# Patient Record
Sex: Female | Born: 1955 | Race: White | Hispanic: No | Smoking: Current every day smoker
Health system: Southern US, Community
[De-identification: ages and names within clinical notes are randomized; demographics above are authoritative.]

## PROBLEM LIST (undated history)

## (undated) DIAGNOSIS — N289 Disorder of kidney and ureter, unspecified: Secondary | ICD-10-CM

## (undated) DIAGNOSIS — S069XAA Unspecified intracranial injury with loss of consciousness status unknown, initial encounter: Secondary | ICD-10-CM

## (undated) DIAGNOSIS — F0390 Unspecified dementia without behavioral disturbance: Secondary | ICD-10-CM

## (undated) DIAGNOSIS — K219 Gastro-esophageal reflux disease without esophagitis: Secondary | ICD-10-CM

## (undated) DIAGNOSIS — G629 Polyneuropathy, unspecified: Secondary | ICD-10-CM

## (undated) DIAGNOSIS — J449 Chronic obstructive pulmonary disease, unspecified: Secondary | ICD-10-CM

## (undated) DIAGNOSIS — J45909 Unspecified asthma, uncomplicated: Secondary | ICD-10-CM

## (undated) DIAGNOSIS — E785 Hyperlipidemia, unspecified: Secondary | ICD-10-CM

## (undated) DIAGNOSIS — F419 Anxiety disorder, unspecified: Secondary | ICD-10-CM

## (undated) DIAGNOSIS — M1990 Unspecified osteoarthritis, unspecified site: Secondary | ICD-10-CM

## (undated) DIAGNOSIS — F32A Depression, unspecified: Secondary | ICD-10-CM

## (undated) DIAGNOSIS — G4730 Sleep apnea, unspecified: Secondary | ICD-10-CM

## (undated) DIAGNOSIS — E7800 Pure hypercholesterolemia, unspecified: Secondary | ICD-10-CM

## (undated) DIAGNOSIS — I6782 Cerebral ischemia: Secondary | ICD-10-CM

## (undated) DIAGNOSIS — F329 Major depressive disorder, single episode, unspecified: Secondary | ICD-10-CM

## (undated) DIAGNOSIS — R569 Unspecified convulsions: Secondary | ICD-10-CM

## (undated) HISTORY — PX: ESOPHAGEAL DILATION: SHX303

## (undated) HISTORY — PX: FOOT SURGERY: SHX648

---

## 2004-04-14 ENCOUNTER — Emergency Department: Payer: Self-pay | Admitting: Emergency Medicine

## 2008-11-09 ENCOUNTER — Emergency Department: Payer: Self-pay | Admitting: Internal Medicine

## 2009-05-25 ENCOUNTER — Emergency Department: Payer: Self-pay | Admitting: Emergency Medicine

## 2009-05-29 ENCOUNTER — Emergency Department: Payer: Self-pay | Admitting: Emergency Medicine

## 2009-07-26 ENCOUNTER — Emergency Department: Payer: Self-pay | Admitting: Emergency Medicine

## 2009-10-07 ENCOUNTER — Emergency Department: Payer: Self-pay | Admitting: Emergency Medicine

## 2010-05-15 ENCOUNTER — Inpatient Hospital Stay: Payer: Self-pay | Admitting: Internal Medicine

## 2010-11-05 ENCOUNTER — Inpatient Hospital Stay (HOSPITAL_COMMUNITY)
Admission: EM | Admit: 2010-11-05 | Discharge: 2010-11-10 | DRG: 100 | Disposition: A | Discharge status: Home / Self Care | Payer: Medicaid Other | Attending: Internal Medicine | Admitting: Internal Medicine

## 2010-11-05 ENCOUNTER — Emergency Department (HOSPITAL_COMMUNITY): Payer: Medicaid Other

## 2010-11-05 DIAGNOSIS — J9600 Acute respiratory failure, unspecified whether with hypoxia or hypercapnia: Secondary | ICD-10-CM | POA: Diagnosis present

## 2010-11-05 DIAGNOSIS — F3289 Other specified depressive episodes: Secondary | ICD-10-CM | POA: Diagnosis present

## 2010-11-05 DIAGNOSIS — J45909 Unspecified asthma, uncomplicated: Secondary | ICD-10-CM | POA: Diagnosis present

## 2010-11-05 DIAGNOSIS — K219 Gastro-esophageal reflux disease without esophagitis: Secondary | ICD-10-CM | POA: Diagnosis present

## 2010-11-05 DIAGNOSIS — F329 Major depressive disorder, single episode, unspecified: Secondary | ICD-10-CM | POA: Diagnosis present

## 2010-11-05 DIAGNOSIS — E785 Hyperlipidemia, unspecified: Secondary | ICD-10-CM | POA: Diagnosis present

## 2010-11-05 DIAGNOSIS — Z79899 Other long term (current) drug therapy: Secondary | ICD-10-CM

## 2010-11-05 DIAGNOSIS — G40802 Other epilepsy, not intractable, without status epilepticus: Principal | ICD-10-CM | POA: Diagnosis present

## 2010-11-05 DIAGNOSIS — N390 Urinary tract infection, site not specified: Secondary | ICD-10-CM | POA: Diagnosis present

## 2010-11-05 DIAGNOSIS — Z87820 Personal history of traumatic brain injury: Secondary | ICD-10-CM

## 2010-11-05 DIAGNOSIS — J690 Pneumonitis due to inhalation of food and vomit: Secondary | ICD-10-CM | POA: Diagnosis present

## 2010-11-05 LAB — CBC
MCH: 31.2 pg (ref 26.0–34.0)
MCV: 94.5 fL (ref 78.0–100.0)
Platelets: 232 10*3/uL (ref 150–400)
RBC: 3.82 MIL/uL — ABNORMAL LOW (ref 3.87–5.11)
RDW: 14 % (ref 11.5–15.5)

## 2010-11-05 LAB — POCT I-STAT 3, ART BLOOD GAS (G3+)
Acid-Base Excess: 2 mmol/L (ref 0.0–2.0)
Bicarbonate: 26.6 mEq/L — ABNORMAL HIGH (ref 20.0–24.0)
O2 Saturation: 100 %
TCO2: 28 mmol/L (ref 0–100)

## 2010-11-05 LAB — CK TOTAL AND CKMB (NOT AT ARMC)
CK, MB: 3 ng/mL (ref 0.3–4.0)
Total CK: 186 U/L — ABNORMAL HIGH (ref 7–177)

## 2010-11-05 LAB — RAPID URINE DRUG SCREEN, HOSP PERFORMED
Benzodiazepines: POSITIVE — AB
Tetrahydrocannabinol: NOT DETECTED

## 2010-11-05 LAB — URINALYSIS, ROUTINE W REFLEX MICROSCOPIC
Glucose, UA: NEGATIVE mg/dL
Leukocytes, UA: NEGATIVE
Protein, ur: 100 mg/dL — AB
Urobilinogen, UA: 0.2 mg/dL (ref 0.0–1.0)

## 2010-11-05 LAB — ETHANOL: Alcohol, Ethyl (B): <11 mg/dL — ABNORMAL HIGH (ref 0–10)

## 2010-11-05 LAB — DIFFERENTIAL
Eosinophils Absolute: 0.2 10*3/uL (ref 0.0–0.7)
Eosinophils Relative: 2 % (ref 0–5)
Lymphs Abs: 1.1 10*3/uL (ref 0.7–4.0)
Monocytes Relative: 6 % (ref 3–12)

## 2010-11-05 LAB — COMPREHENSIVE METABOLIC PANEL
Albumin: 3.8 g/dL (ref 3.5–5.2)
BUN: 12 mg/dL (ref 6–23)
Chloride: 100 mEq/L (ref 96–112)
Creatinine, Ser: 0.93 mg/dL (ref 0.4–1.2)
GFR calc non Af Amer: >60 mL/min (ref >60)
Glucose, Bld: 153 mg/dL — ABNORMAL HIGH (ref 70–99)
Total Bilirubin: 0.1 mg/dL — ABNORMAL LOW (ref 0.3–1.2)

## 2010-11-05 LAB — ACETAMINOPHEN LEVEL: Acetaminophen (Tylenol), Serum: <15 ug/mL (ref 10–30)

## 2010-11-05 LAB — AMMONIA: Ammonia: 36 umol/L (ref 11–60)

## 2010-11-05 LAB — SALICYLATE LEVEL: Salicylate Lvl: <2 mg/dL — ABNORMAL LOW (ref 2.8–20.0)

## 2010-11-05 LAB — URINE MICROSCOPIC-ADD ON

## 2010-11-06 ENCOUNTER — Inpatient Hospital Stay (HOSPITAL_COMMUNITY): Payer: Medicaid Other

## 2010-11-06 DIAGNOSIS — R569 Unspecified convulsions: Secondary | ICD-10-CM

## 2010-11-06 DIAGNOSIS — J45909 Unspecified asthma, uncomplicated: Secondary | ICD-10-CM

## 2010-11-06 DIAGNOSIS — J9600 Acute respiratory failure, unspecified whether with hypoxia or hypercapnia: Secondary | ICD-10-CM

## 2010-11-06 LAB — BASIC METABOLIC PANEL
CO2: 25 mEq/L (ref 19–32)
Calcium: 8.4 mg/dL (ref 8.4–10.5)
Chloride: 101 mEq/L (ref 96–112)
Creatinine, Ser: 0.71 mg/dL (ref 0.4–1.2)
Glucose, Bld: 139 mg/dL — ABNORMAL HIGH (ref 70–99)

## 2010-11-06 LAB — URINE CULTURE
Colony Count: NO GROWTH
Culture  Setup Time: 201205280048
Culture: NO GROWTH

## 2010-11-06 LAB — URINALYSIS, ROUTINE W REFLEX MICROSCOPIC
Glucose, UA: NEGATIVE mg/dL
Nitrite: NEGATIVE
Protein, ur: 30 mg/dL — AB
Urobilinogen, UA: 0.2 mg/dL (ref 0.0–1.0)

## 2010-11-06 LAB — CBC
HCT: 37.1 % (ref 36.0–46.0)
Hemoglobin: 12.4 g/dL (ref 12.0–15.0)
MCH: 31.6 pg (ref 26.0–34.0)
MCHC: 33.4 g/dL (ref 30.0–36.0)
MCV: 94.6 fL (ref 78.0–100.0)
RDW: 14.3 % (ref 11.5–15.5)

## 2010-11-06 LAB — URINE MICROSCOPIC-ADD ON

## 2010-11-06 LAB — POCT I-STAT 3, ART BLOOD GAS (G3+)
Acid-Base Excess: 2 mmol/L (ref 0.0–2.0)
Bicarbonate: 26.2 mEq/L — ABNORMAL HIGH (ref 20.0–24.0)
O2 Saturation: 97 %
Patient temperature: 99.7
TCO2: 27 mmol/L (ref 0–100)
pH, Arterial: 7.414 — ABNORMAL HIGH (ref 7.350–7.400)

## 2010-11-06 LAB — PHOSPHORUS: Phosphorus: 3 mg/dL (ref 2.3–4.6)

## 2010-11-07 ENCOUNTER — Inpatient Hospital Stay (HOSPITAL_COMMUNITY): Payer: Medicaid Other

## 2010-11-07 DIAGNOSIS — J9600 Acute respiratory failure, unspecified whether with hypoxia or hypercapnia: Secondary | ICD-10-CM

## 2010-11-07 DIAGNOSIS — G40309 Generalized idiopathic epilepsy and epileptic syndromes, not intractable, without status epilepticus: Secondary | ICD-10-CM

## 2010-11-07 LAB — POCT I-STAT 3, ART BLOOD GAS (G3+)
O2 Saturation: 96 %
pCO2 arterial: 37.3 mmHg (ref 35.0–45.0)
pH, Arterial: 7.412 — ABNORMAL HIGH (ref 7.350–7.400)
pO2, Arterial: 83 mmHg (ref 80.0–100.0)

## 2010-11-07 LAB — CBC
Platelets: 154 10*3/uL (ref 150–400)
RBC: 3.28 MIL/uL — ABNORMAL LOW (ref 3.87–5.11)
RDW: 14.7 % (ref 11.5–15.5)
WBC: 7.1 10*3/uL (ref 4.0–10.5)

## 2010-11-07 LAB — URINE CULTURE
Colony Count: NO GROWTH
Culture: NO GROWTH

## 2010-11-07 LAB — BASIC METABOLIC PANEL
Calcium: 8.2 mg/dL — ABNORMAL LOW (ref 8.4–10.5)
Creatinine, Ser: 0.7 mg/dL (ref 0.4–1.2)
GFR calc Af Amer: >60 mL/min (ref >60)

## 2010-11-08 DIAGNOSIS — J9600 Acute respiratory failure, unspecified whether with hypoxia or hypercapnia: Secondary | ICD-10-CM

## 2010-11-08 DIAGNOSIS — G40309 Generalized idiopathic epilepsy and epileptic syndromes, not intractable, without status epilepticus: Secondary | ICD-10-CM

## 2010-11-08 LAB — BASIC METABOLIC PANEL
CO2: 27 mEq/L (ref 19–32)
Calcium: 8.6 mg/dL (ref 8.4–10.5)
Chloride: 104 mEq/L (ref 96–112)
GFR calc Af Amer: >60 mL/min (ref >60)
Glucose, Bld: 82 mg/dL (ref 70–99)
Potassium: 2.9 mEq/L — ABNORMAL LOW (ref 3.5–5.1)
Sodium: 141 mEq/L (ref 135–145)

## 2010-11-08 LAB — CULTURE, RESPIRATORY W GRAM STAIN

## 2010-11-08 LAB — CBC
HCT: 32.3 % — ABNORMAL LOW (ref 36.0–46.0)
Hemoglobin: 11.1 g/dL — ABNORMAL LOW (ref 12.0–15.0)
MCHC: 34.4 g/dL (ref 30.0–36.0)
RBC: 3.45 MIL/uL — ABNORMAL LOW (ref 3.87–5.11)
WBC: 5.5 10*3/uL (ref 4.0–10.5)

## 2010-11-09 ENCOUNTER — Inpatient Hospital Stay (HOSPITAL_COMMUNITY): Payer: Medicaid Other

## 2010-11-09 LAB — BASIC METABOLIC PANEL
BUN: 5 mg/dL — ABNORMAL LOW (ref 6–23)
CO2: 25 mEq/L (ref 19–32)
Chloride: 103 mEq/L (ref 96–112)
GFR calc non Af Amer: >60 mL/min (ref >60)
Glucose, Bld: 103 mg/dL — ABNORMAL HIGH (ref 70–99)
Potassium: 3.4 mEq/L — ABNORMAL LOW (ref 3.5–5.1)
Sodium: 140 mEq/L (ref 135–145)

## 2010-11-09 MED ORDER — GADOBENATE DIMEGLUMINE 529 MG/ML IV SOLN
20.0000 mL | Freq: Once | INTRAVENOUS | Status: AC
  Administered 2010-11-09: 20 mL via INTRAVENOUS

## 2010-11-10 DIAGNOSIS — J690 Pneumonitis due to inhalation of food and vomit: Secondary | ICD-10-CM

## 2010-11-10 NOTE — Procedures (Unsigned)
REFERRING PHYSICIAN:  Dr. Sherene Sires.  HISTORY:  A 55 year old woman admitted for recurrent seizures and then intubated for airway protection and now extubated.  There is known history of traumatic brain injury and seizure disorder.  MEDICATIONS:  Klonopin, Cymbalta, Dilantin, Keppra, Combivent, Carafate, Claritin.  EEG DURATION:  22 minutes of EEG recording.  EEG DESCRIPTION:  This is a routine 18 channel adult EEG recording with one channel devoted to limited EKG recording.  Activation procedure is performed during the photic stimulation the studies performed in awake and drowsy state.  As the EEG opens up to see that the posterior dominant rhythm consist of 8 Hz frequency and is relatively better formed on the left side as compared to the right side.  Throughout the study is scattered EMG artifact and superimposed beta activity.  The amplitude of the background rhythm is low as well with 16 microvolts at the most.  Some degree of breach rhythm is also noted on the right frontotemporal leads. However, there is no evidence to suggest electrographic seizures or epileptiform discharges.  I do not see any clear asymmetry, some vertex waves, and synchronous spindles were also noted during the study. However, no deep stages of sleep are recorded during the study.  EEG INTERPRETATION:  This is a normal awake and drowsy EEG.  There is no evidence to suggest electrographic seizures or epileptiform discharges. The breach rhythm is normally seen in people who have craniotomy and is normal for the patient's known history after traumatic brain injury. I would otherwise do not see any abnormality on this study.  CLINICAL NOTE:  Although, the EEG findings have been unremarkable. 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          ______________________________ Levie Heritage, MD    [id number] D:  11/09/2010 18:19:09  T:  11/10/2010 05:22:33  Job #:  [id number]

## 2010-11-12 LAB — CULTURE, BLOOD (ROUTINE X 2)
Culture  Setup Time: 201205280956
Culture  Setup Time: 201205281423
Culture: NO GROWTH
Culture: NO GROWTH

## 2010-11-13 NOTE — Discharge Summary (Addendum)
Marcia Brown, BAR NO.:  [id number]  MEDICAL RECORD NO.:  [id number]           PATIENT TYPE:  I  LOCATION:  3032                         FACILITY:  [facility]  PHYSICIAN:  Oretha Milch, MD      DATE OF BIRTH:  [birth year]  DATE OF ADMISSION:  11/05/2010 DATE OF DISCHARGE:  11/10/2010                              DISCHARGE SUMMARY   DISCHARGE DIAGNOSES: 1. Acute respiratory failure secondary to seizures with presumed     aspiration event. 2. Seizure disorder.  LABORATORY DATA:  On Nov 09, 2010, BMP demonstrates sodium 140, potassium 3.4, chloride 103, CO2 25, glucose 103, BUN 5, creatinine 0.49, calcium 9.0.  On Nov 08, 2010, CBC demonstrates WBC 5.5, hemoglobin 11.1, hematocrit 32.3, platelet count 164.  On Nov 06, 2010, lactic acid 2.3.  Urine drug screen from Nov 05, 2010, positive for benzodiazepines.  Dilantin level is 18.3 on admission.  MICROBIOLOGY DATA:  On Nov 06, 2010, urinalysis demonstrates small leukocytes with 0-2 wbc's.  On Nov 05, 2010, urine culture demonstrates no growth.  On Nov 06, 2010, blood cultures x4 demonstrate no growth on preliminary results, final results pending.  On Nov 06, 2010, urine culture is negative.  On Nov 06, 2010, respiratory culture is consistent with normal oropharyngeal flora.  RADIOLOGIC DATA:  CT of the head without contrast on Nov 06, 2010, demonstrates prior right temporal craniotomy and right temporal lobe encephalomalacia.  No acute intracranial hemorrhage or CT evidence of large acute infarct.  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  No acute intracranial abnormality.  LINE/TUBE:  Oral endotracheal tube from Nov 06, 2010 to Nov 07, 2010.  BEST PRACTICE:  The patient was placed on proton pump inhibitor for stress ulcer prophylaxis and heparin subcutaneously for DVT prevention.  CONSULTANTS/PROTOCOLS: 1.  Intermittent sedation. 2. [facility].  HISTORY OF PRESENT ILLNESS:  Marcia Brown is a 55 year old white female with a past medical history of traumatic brain injury, seizure disorder, depression, asthma, GERD, history of GI bleed, and hyperlipidemia, who presented to [facility] on Nov 05, 2010, after a seizure event at home. Her husband activated EMS and en route to the hospital, Marcia Brown experienced recurrent seizures with multiple episodes of emesis.  She did require approximately 10 mg of Versed in the ambulance.  In the emergency department, the patient was noted to be ceased again, it was intubated for airway protection in the setting of multiple vomiting episodes.  She was maintained on propofol for sedation and then did not exhibit any further seizure activity.  Pulmonary Critical Care Medicine admitted the patient to 2100 where she was maintained on mechanical ventilation for approximately 24 hours, at which time she was liberated from the ventilator without further pulmonary interventions.  She does have a known history of CPAP use at home and was continued on this during hospitalization at night with AutoSet CPAP.  She was treated empirically for aspiration pneumonia with Rocephin and was transitioned to [place] at the time  of discharge for a total of 7 days.  Marcia Brown was evaluated by Neurology upon admission and medication adjustments were made per [facility].  Initial CT of the head demonstrated old traumatic brain injury and surgical changes, but no new acute findings.  She was further evaluated by Neurology with an MRI which did not demonstrate any acute new findings and an EEG which was negative.  It was felt by Neurology that she had multiple seizures which were likely focal with secondary generalization due to right temporal injury despite EEG and MRI being negative.  It was recommended that she continue on her antiepileptic  drugs.  She is at very high risk for seizures which can be produced with acute infectious process and/or systemic illnesses.  HOSPITAL COURSE BY DISCHARGE DIAGNOSES: 1. Acute respiratory failure secondary to questionable seizures with     presumed aspiration event.  As per HPI, Marcia Brown was admitted on     Nov 05, 2010, with seizure activity noted at home, also was     witnessed in the ambulance and in the emergency department.  She     was intubated in the [facility] Emergency Department for airway     protection in the setting of multiple vomiting episodes and was     subsequently liberated from the ventilator approximately 24 hours     later.  She was covered empirically with Rocephin for aspiration     pneumonia and transitioned to [facility] at the time of discharge.     Post-extubation, she was maintained on AutoSet CPAP nocturnally as     she does wear this at home with 3 liters of O2 blood in. 2. Seizures.  Marcia Brown was evaluated by Neurology during     hospitalization.  It was felt that these were focal seizures with     generalization in the setting of old right temporal injury.  EEG     and MRI are negative.  It was recommended she continue her     antiseizure medications.  Please see medication list below and     follow up with [facility].  DISCHARGE INSTRUCTIONS: 1. Activity:  Marcia Brown has been instructed to increase activity slowly     as tolerated.  It is anticipated that she will not need any further     followup at home in regard to physical therapy or occupational     therapy, no needs identified during this hospitalization. 2. Diet:  Heart-healthy diet.  FOLLOWUP APPOINTMENTS:  [facility] was called for followup appointment.  They will call Marcia Brown with a followup appointment in the office.  DISCHARGE MEDICATIONS: 1. Ceftin 500 mg by mouth twice daily with meals, next dose due on the     evening of November 10, 2010. 2. Keppra 1000 mg by mouth twice  daily. 3. Phenytoin 200 mg by mouth every other day. 4. Phenytoin 300 mg by mouth every other day. 5. Albuterol inhaler 1 puff inhaled daily as needed for shortness of     breath. 6. Clonazepam 0.5 mg 1 tablet by mouth twice daily. 7. Cymbalta 60 mg 1 capsule by mouth daily. 8. Flexeril 10 mg by mouth 3 times daily. 9. Loratadine 10 mg by mouth daily. 10.Nexium 40 mg by mouth daily. 11.Simvastatin 40 mg mouth daily. 12.Spiriva 18 mcg inhaled daily. 13.Sucralfate 1 g by mouth 4 times daily. 14.Tramadol 50 mg 1 tablet by mouth 4 times daily.  Medications that were changed or discontinued at  the time of discharge, Dilantin dosing, please see above.  DISPOSITION AT THE TIME OF DISCHARGE:  Marcia Brown has met maximum benefit of inpatient therapy and is currently medically stable and cleared for discharge pending follow up with [facility].     Canary Brim, NP   ______________________________ Oretha Milch, MD    BO/MEDQ  D:  11/10/2010  T:  11/11/2010  Job:  [id number]  cc:   [facility]  Electronically Signed by Canary Brim  on 11/13/2010 04:09:29 PM Electronically Signed by Cyril Mourning MD on 11/17/2010 09:25:21 AM

## 2010-11-17 NOTE — Consult Note (Signed)
NAMECITLALLY, CAPTAIN NO.:  [id number]  MEDICAL RECORD NO.:  [id number]           PATIENT TYPE:  I  LOCATION:  2113                         FACILITY:  [facility]  PHYSICIAN:  Thana Farr, MD    DATE OF BIRTH:  01/08/1956  DATE OF CONSULTATION:  11/06/2010 DATE OF DISCHARGE:                                CONSULTATION   TIME OF CONSULTATION:  12:15 p.m.  CHIEF COMPLAINT:  Seizures.  HISTORY OF PRESENT ILLNESS:  A 55 year old female with UTI and seizures who presents to [facility] ED with main concern of ongoing seizures.  I am unable to obtain detailed history from patient because she is sedated and intubated and no family present at the bedside for further details. Per ED records,  the patient's husband called EMS this morning after patient experienced seizure.  The patient had recurrent seizure en route to hospital with vomiting.  She had seizure again in the hospital and has required intubation for airway protection.  Propofol was started in ED for sedation.  PAST MEDICAL HISTORY:  Hyperlipidemia, seizures, head trauma, fevers, asthma, and GERD.  MEDICATIONS:  Currently in the hospital Versed 1 g IV and Keppra 500 mg IV b.i.d., Protonix.  ALLERGIES:  NKDA.  FAMILY HISTORY:  Noncontributory.  SOCIAL HISTORY:  Unable to obtain because patient is sedated.  REVIEW OF SYSTEMS:  Nausea, vomiting with seizures, otherwise as per HPI.  PHYSICAL EXAMINATION:  VITAL SIGNS:  Blood pressure 123/68, pulse 80, respirations 19, temperature 99.8, oxygen saturation 97% on 40% ventilator. NEUROLOGIC:  Mental status - the patient is sedated, not following commands, intubated, makes sound, but not comprehensible.  Cranial nerves II - discs flat bilaterally; III, IV, VI - doll's eye positive; V and VII - face appears symmetric with no facial droop, corneal reflex on the right, unable to elicit, intact on the left, patient grimaces with painful stimuli in facial  area. MOTOR:  The patient moves all extremities continuously, but not against gravity, tone is normal throughout. SENSATION:  The patient withdraws to painful stimuli throughout. DTRs - positive bilaterally in upper extremities, +3 bilaterally in lower extremities, plantar is new.  Coordination - unable to assess this patient, unable to follow commands.  She is sedated and intubated. LUNGS:  Clear to auscultation bilaterally. CARDIOVASCULAR:  S1 and S2.  Regular rate and no murmurs.  LABORATORY DATA:  Sodium 138, potassium 3.4, chloride 101, bicarb 25, BUN 10, creatinine 0.71, glucose 139.  WBC 11.8, hemoglobin 12.4, platelets 160.  LFTs within normal limits.  Lactate 2.3.  CT of the head, prior right temporal craniotomy and right temporal lobe encephalomalacia.  No intracranial hemorrhage or CT evidence of large lacunar infarct.  ASSESSMENT:  The patient is 55 year old female with urinary tract infection, seizures, presents to [facility] with ongoing seizures.  I am unable to obtain detailed history, no family is at bedside and patient is sedated.  On neurologic exam, she is moving extremities spontaneously and withdraws to painful stimuli.  Brain stem reflexes appear intact except corneal reflex on the left side.  Unclear etiology, but would consider and  rule out infectious cause (urinary tract infection) versus infarction.  PLAN: 1. Agree with continuing Keppra IV current dose, and switching to p.o.     once patient able to tolerate. 2. Agree with continuing workup of infectious etiology. 3. Recommend MRI once patient is extubated for further evaluation. 4. EEG.  Over 30 minutes was spent on consultation.          ______________________________ Thana Farr, MD     LR/MEDQ  D:  11/06/2010  T:  11/07/2010  Job:  [id number]  Electronically Signed by Thana Farr MD on 11/17/2010 98:11:91 PM

## 2011-02-23 ENCOUNTER — Emergency Department: Payer: Self-pay | Admitting: Emergency Medicine

## 2011-04-27 ENCOUNTER — Emergency Department: Payer: Self-pay | Admitting: Emergency Medicine

## 2011-07-11 ENCOUNTER — Inpatient Hospital Stay: Payer: Self-pay | Admitting: Internal Medicine

## 2011-07-11 LAB — DRUG SCREEN, URINE
Cannabinoid 50 Ng, Ur ~~LOC~~: NEGATIVE (ref ?–50)
Cocaine Metabolite,Ur ~~LOC~~: NEGATIVE (ref ?–300)
MDMA (Ecstasy)Ur Screen: NEGATIVE (ref ?–500)
Methadone, Ur Screen: NEGATIVE (ref ?–300)
Opiate, Ur Screen: NEGATIVE (ref ?–300)
Phencyclidine (PCP) Ur S: NEGATIVE (ref ?–25)
Tricyclic, Ur Screen: NEGATIVE (ref ?–1000)

## 2011-07-11 LAB — COMPREHENSIVE METABOLIC PANEL
Albumin: 3 g/dL — ABNORMAL LOW (ref 3.4–5.0)
Alkaline Phosphatase: 65 U/L (ref 50–136)
Bilirubin,Total: 0.3 mg/dL (ref 0.2–1.0)
Creatinine: 0.81 mg/dL (ref 0.60–1.30)
Glucose: 89 mg/dL (ref 65–99)
Osmolality: 273 (ref 275–301)
SGPT (ALT): 23 U/L
Sodium: 138 mmol/L (ref 136–145)
Total Protein: 5.9 g/dL — ABNORMAL LOW (ref 6.4–8.2)

## 2011-07-11 LAB — FERRITIN: Ferritin (ARMC): 3 ng/mL — ABNORMAL LOW (ref 8–388)

## 2011-07-11 LAB — CBC WITH DIFFERENTIAL/PLATELET
Basophil %: 1.1 %
Eosinophil #: 0.1 10*3/uL (ref 0.0–0.7)
Eosinophil %: 1.5 %
HGB: 4.7 g/dL — CL (ref 12.0–16.0)
Lymphocyte #: 1.3 10*3/uL (ref 1.0–3.6)
MCH: 22.7 pg — ABNORMAL LOW (ref 26.0–34.0)
MCHC: 31 g/dL — ABNORMAL LOW (ref 32.0–36.0)
MCV: 73 fL — ABNORMAL LOW (ref 80–100)
Monocyte #: 0.4 10*3/uL (ref 0.0–0.7)
Neutrophil %: 53.8 %
Platelet: 236 10*3/uL (ref 150–440)
RBC: 2.06 10*6/uL — ABNORMAL LOW (ref 3.80–5.20)

## 2011-07-11 LAB — APTT: Activated PTT: 27.9 secs (ref 23.6–35.9)

## 2011-07-11 LAB — PHENYTOIN LEVEL, TOTAL: Dilantin: 13.5 ug/mL (ref 10.0–20.0)

## 2011-07-11 LAB — IRON AND TIBC
Iron Bind.Cap.(Total): 406 ug/dL (ref 250–450)
Iron Saturation: 6 %
Iron: 23 ug/dL — ABNORMAL LOW (ref 50–170)

## 2011-07-11 LAB — TROPONIN I: Troponin-I: <0.02 ng/mL

## 2011-07-12 LAB — COMPREHENSIVE METABOLIC PANEL
Alkaline Phosphatase: 66 U/L (ref 50–136)
Anion Gap: 10 (ref 7–16)
BUN: 6 mg/dL — ABNORMAL LOW (ref 7–18)
Calcium, Total: 8.1 mg/dL — ABNORMAL LOW (ref 8.5–10.1)
Chloride: 105 mmol/L (ref 98–107)
EGFR (African American): >60
Potassium: 3.4 mmol/L — ABNORMAL LOW (ref 3.5–5.1)
SGOT(AST): 19 U/L (ref 15–37)

## 2011-07-12 LAB — HEMOGLOBIN: HGB: 8 g/dL — ABNORMAL LOW (ref 12.0–16.0)

## 2011-07-12 LAB — TROPONIN I: Troponin-I: <0.02 ng/mL

## 2012-02-06 ENCOUNTER — Ambulatory Visit: Payer: Self-pay

## 2012-04-22 ENCOUNTER — Ambulatory Visit: Payer: Self-pay | Admitting: Neurology

## 2012-05-07 ENCOUNTER — Emergency Department: Payer: Self-pay | Admitting: Emergency Medicine

## 2012-05-07 LAB — URINALYSIS, COMPLETE
Leukocyte Esterase: NEGATIVE
Nitrite: NEGATIVE
Ph: 6 (ref 4.5–8.0)
Protein: NEGATIVE
RBC,UR: 37 /HPF (ref 0–5)

## 2012-05-07 LAB — CBC
HCT: 40.6 % (ref 35.0–47.0)
HGB: 14 g/dL (ref 12.0–16.0)
MCH: 35.4 pg — ABNORMAL HIGH (ref 26.0–34.0)
RBC: 3.97 10*6/uL (ref 3.80–5.20)

## 2012-05-07 LAB — PHENYTOIN LEVEL, TOTAL: Dilantin: 10.1 ug/mL (ref 10.0–20.0)

## 2012-05-07 LAB — COMPREHENSIVE METABOLIC PANEL
Albumin: 4.1 g/dL (ref 3.4–5.0)
Alkaline Phosphatase: 77 U/L (ref 50–136)
Anion Gap: 8 (ref 7–16)
Calcium, Total: 8.6 mg/dL (ref 8.5–10.1)
Glucose: 96 mg/dL (ref 65–99)
Potassium: 4.1 mmol/L (ref 3.5–5.1)
SGOT(AST): 33 U/L (ref 15–37)
SGPT (ALT): 43 U/L (ref 12–78)

## 2012-05-07 LAB — ETHANOL
Ethanol %: <0.003 % (ref 0.000–0.080)
Ethanol: <3 mg/dL

## 2012-05-26 ENCOUNTER — Ambulatory Visit: Payer: Self-pay | Admitting: Surgery

## 2012-07-23 ENCOUNTER — Ambulatory Visit: Payer: Self-pay | Admitting: Gastroenterology

## 2012-08-15 ENCOUNTER — Emergency Department: Payer: Self-pay | Admitting: Emergency Medicine

## 2012-08-15 LAB — BASIC METABOLIC PANEL
Anion Gap: 3 — ABNORMAL LOW (ref 7–16)
BUN: 17 mg/dL (ref 7–18)
Co2: 28 mmol/L (ref 21–32)
Creatinine: 0.71 mg/dL (ref 0.60–1.30)
EGFR (African American): >60
EGFR (Non-African Amer.): >60
Glucose: 91 mg/dL (ref 65–99)
Potassium: 4.1 mmol/L (ref 3.5–5.1)
Sodium: 137 mmol/L (ref 136–145)

## 2012-08-15 LAB — CBC WITH DIFFERENTIAL/PLATELET
Basophil #: 0.1 10*3/uL (ref 0.0–0.1)
Basophil %: 1 %
HCT: 38.8 % (ref 35.0–47.0)
HGB: 13.4 g/dL (ref 12.0–16.0)
Lymphocyte %: 21.8 %
MCH: 35.6 pg — ABNORMAL HIGH (ref 26.0–34.0)
MCHC: 34.7 g/dL (ref 32.0–36.0)
Monocyte #: 0.6 x10 3/mm (ref 0.2–0.9)
Neutrophil %: 63.9 %
Platelet: 186 10*3/uL (ref 150–440)
RBC: 3.77 10*6/uL — ABNORMAL LOW (ref 3.80–5.20)
WBC: 5.1 10*3/uL (ref 3.6–11.0)

## 2012-08-15 LAB — URINALYSIS, COMPLETE
Bilirubin,UR: NEGATIVE
Ketone: NEGATIVE
Nitrite: NEGATIVE
Ph: 7 (ref 4.5–8.0)
RBC,UR: 1 /HPF (ref 0–5)
Specific Gravity: 1.013 (ref 1.003–1.030)
Squamous Epithelial: 1

## 2012-08-15 LAB — PHENYTOIN LEVEL, TOTAL: Dilantin: 13.4 ug/mL (ref 10.0–20.0)

## 2012-09-16 ENCOUNTER — Emergency Department: Payer: Self-pay | Admitting: Emergency Medicine

## 2012-10-09 ENCOUNTER — Emergency Department: Payer: Self-pay | Admitting: Emergency Medicine

## 2012-10-09 LAB — COMPREHENSIVE METABOLIC PANEL
Anion Gap: 6 — ABNORMAL LOW (ref 7–16)
BUN: 14 mg/dL (ref 7–18)
Bilirubin,Total: 0.2 mg/dL (ref 0.2–1.0)
Chloride: 110 mmol/L — ABNORMAL HIGH (ref 98–107)
Creatinine: 0.61 mg/dL (ref 0.60–1.30)
EGFR (African American): >60
SGPT (ALT): 38 U/L (ref 12–78)

## 2012-10-09 LAB — CBC WITH DIFFERENTIAL/PLATELET
Basophil #: 0 10*3/uL (ref 0.0–0.1)
Basophil %: 0.5 %
Eosinophil #: 0.1 10*3/uL (ref 0.0–0.7)
Eosinophil %: 3.2 %
HCT: 38.3 % (ref 35.0–47.0)
Lymphocyte #: 1.5 10*3/uL (ref 1.0–3.6)
MCHC: 34.9 g/dL (ref 32.0–36.0)
Monocyte #: 0.6 x10 3/mm (ref 0.2–0.9)
Monocyte %: 12.2 %
Neutrophil %: 51.6 %
Platelet: 159 10*3/uL (ref 150–440)
RBC: 3.84 10*6/uL (ref 3.80–5.20)
RDW: 13.5 % (ref 11.5–14.5)

## 2012-12-09 ENCOUNTER — Ambulatory Visit: Payer: Self-pay | Admitting: Internal Medicine

## 2013-02-04 ENCOUNTER — Ambulatory Visit: Payer: Self-pay | Admitting: Family Medicine

## 2013-04-13 ENCOUNTER — Ambulatory Visit: Payer: Self-pay | Admitting: Gastroenterology

## 2013-08-10 ENCOUNTER — Emergency Department: Payer: Self-pay | Admitting: Emergency Medicine

## 2013-09-10 DIAGNOSIS — N390 Urinary tract infection, site not specified: Secondary | ICD-10-CM | POA: Insufficient documentation

## 2013-12-20 ENCOUNTER — Emergency Department: Payer: Self-pay | Admitting: Emergency Medicine

## 2013-12-20 LAB — COMPREHENSIVE METABOLIC PANEL
ALBUMIN: 3.7 g/dL (ref 3.4–5.0)
AST: 32 U/L (ref 15–37)
Alkaline Phosphatase: 62 U/L
Anion Gap: 7 (ref 7–16)
BUN: 13 mg/dL (ref 7–18)
Bilirubin,Total: 0.3 mg/dL (ref 0.2–1.0)
CO2: 26 mmol/L (ref 21–32)
Calcium, Total: 8.5 mg/dL (ref 8.5–10.1)
Chloride: 105 mmol/L (ref 98–107)
Creatinine: 0.91 mg/dL (ref 0.60–1.30)
EGFR (African American): >60
EGFR (Non-African Amer.): >60
GLUCOSE: 96 mg/dL (ref 65–99)
OSMOLALITY: 276 (ref 275–301)
Potassium: 3.8 mmol/L (ref 3.5–5.1)
SGPT (ALT): 38 U/L (ref 12–78)
SODIUM: 138 mmol/L (ref 136–145)
TOTAL PROTEIN: 7.1 g/dL (ref 6.4–8.2)

## 2013-12-20 LAB — ETHANOL
Ethanol %: <0.003 % (ref 0.000–0.080)
Ethanol: <3 mg/dL

## 2013-12-20 LAB — URINALYSIS, COMPLETE
Bacteria: NONE SEEN
Bilirubin,UR: NEGATIVE
Glucose,UR: NEGATIVE mg/dL (ref 0–75)
KETONE: NEGATIVE
Nitrite: NEGATIVE
Ph: 6 (ref 4.5–8.0)
Protein: NEGATIVE
Specific Gravity: 1.012 (ref 1.003–1.030)
WBC UR: 63 /HPF (ref 0–5)

## 2013-12-20 LAB — CBC
HCT: 39.9 % (ref 35.0–47.0)
HGB: 13.4 g/dL (ref 12.0–16.0)
MCH: 34.4 pg — ABNORMAL HIGH (ref 26.0–34.0)
MCHC: 33.7 g/dL (ref 32.0–36.0)
MCV: 102 fL — ABNORMAL HIGH (ref 80–100)
Platelet: 180 x10 3/mm 3 (ref 150–440)
RBC: 3.9 X10 6/mm 3 (ref 3.80–5.20)
RDW: 14 % (ref 11.5–14.5)
WBC: 5.1 x10 3/mm 3 (ref 3.6–11.0)

## 2013-12-20 LAB — DRUG SCREEN, URINE

## 2013-12-20 LAB — TROPONIN I: Troponin-I: <0.02 ng/mL

## 2013-12-20 LAB — PHENYTOIN LEVEL, TOTAL: DILANTIN: 15.4 ug/mL (ref 10.0–20.0)

## 2014-01-26 ENCOUNTER — Emergency Department: Payer: Self-pay | Admitting: Emergency Medicine

## 2014-01-26 LAB — CBC
HCT: 39.6 % (ref 35.0–47.0)
HGB: 12.9 g/dL (ref 12.0–16.0)
MCH: 33.5 pg (ref 26.0–34.0)
MCHC: 32.7 g/dL (ref 32.0–36.0)
MCV: 103 fL — ABNORMAL HIGH (ref 80–100)
PLATELETS: 165 10*3/uL (ref 150–440)
RBC: 3.86 10*6/uL (ref 3.80–5.20)
RDW: 14 % (ref 11.5–14.5)
WBC: 4.1 10*3/uL (ref 3.6–11.0)

## 2014-01-26 LAB — BASIC METABOLIC PANEL
Anion Gap: 10 (ref 7–16)
BUN: 10 mg/dL (ref 7–18)
CO2: 26 mmol/L (ref 21–32)
Calcium, Total: 8.5 mg/dL (ref 8.5–10.1)
Chloride: 107 mmol/L (ref 98–107)
Creatinine: 0.82 mg/dL (ref 0.60–1.30)
GLUCOSE: 110 mg/dL — AB (ref 65–99)
Osmolality: 285 (ref 275–301)
Potassium: 3.7 mmol/L (ref 3.5–5.1)
Sodium: 143 mmol/L (ref 136–145)

## 2014-01-26 LAB — PHENYTOIN LEVEL, TOTAL: Dilantin: 12.9 ug/mL (ref 10.0–20.0)

## 2014-01-27 ENCOUNTER — Emergency Department: Payer: Self-pay | Admitting: Emergency Medicine

## 2014-01-27 LAB — CBC
HCT: 39.8 % (ref 35.0–47.0)
HGB: 13.3 g/dL (ref 12.0–16.0)
MCH: 33.9 pg (ref 26.0–34.0)
MCHC: 33.4 g/dL (ref 32.0–36.0)
MCV: 102 fL — AB (ref 80–100)
Platelet: 165 10*3/uL (ref 150–440)
RBC: 3.92 10*6/uL (ref 3.80–5.20)
RDW: 13.5 % (ref 11.5–14.5)
WBC: 5.2 10*3/uL (ref 3.6–11.0)

## 2014-01-27 LAB — URINALYSIS, COMPLETE
BILIRUBIN, UR: NEGATIVE
GLUCOSE, UR: NEGATIVE mg/dL (ref 0–75)
KETONE: NEGATIVE
Leukocyte Esterase: NEGATIVE
Nitrite: NEGATIVE
Ph: 7 (ref 4.5–8.0)
Protein: NEGATIVE
RBC,UR: 3 /HPF (ref 0–5)
Specific Gravity: 1.005 (ref 1.003–1.030)
WBC UR: 2 /HPF (ref 0–5)

## 2014-01-27 LAB — BASIC METABOLIC PANEL
ANION GAP: 10 (ref 7–16)
BUN: 13 mg/dL (ref 7–18)
CO2: 26 mmol/L (ref 21–32)
CREATININE: 0.89 mg/dL (ref 0.60–1.30)
Calcium, Total: 8.8 mg/dL (ref 8.5–10.1)
Chloride: 106 mmol/L (ref 98–107)
EGFR (Non-African Amer.): >60
GLUCOSE: 95 mg/dL (ref 65–99)
Osmolality: 283 (ref 275–301)
Potassium: 4 mmol/L (ref 3.5–5.1)
SODIUM: 142 mmol/L (ref 136–145)

## 2014-01-27 LAB — PHENYTOIN LEVEL, TOTAL: DILANTIN: 14.9 ug/mL (ref 10.0–20.0)

## 2014-02-27 ENCOUNTER — Emergency Department: Payer: Self-pay | Admitting: Emergency Medicine

## 2014-02-27 LAB — BASIC METABOLIC PANEL
ANION GAP: 10 (ref 7–16)
BUN: 17 mg/dL (ref 7–18)
CALCIUM: 8.6 mg/dL (ref 8.5–10.1)
CREATININE: 0.91 mg/dL (ref 0.60–1.30)
Chloride: 105 mmol/L (ref 98–107)
Co2: 26 mmol/L (ref 21–32)
GLUCOSE: 109 mg/dL — AB (ref 65–99)
Osmolality: 283 (ref 275–301)
Potassium: 3.7 mmol/L (ref 3.5–5.1)
SODIUM: 141 mmol/L (ref 136–145)

## 2014-02-27 LAB — URINALYSIS, COMPLETE
Bilirubin,UR: NEGATIVE
GLUCOSE, UR: NEGATIVE mg/dL (ref 0–75)
Ketone: NEGATIVE
Nitrite: NEGATIVE
PH: 5 (ref 4.5–8.0)
PROTEIN: NEGATIVE
RBC,UR: 9 /HPF (ref 0–5)
Specific Gravity: 1.016 (ref 1.003–1.030)
Squamous Epithelial: NONE SEEN
WBC UR: 9 /HPF (ref 0–5)

## 2014-02-27 LAB — CBC
HCT: 40.5 % (ref 35.0–47.0)
HGB: 13.7 g/dL (ref 12.0–16.0)
MCH: 34.2 pg — ABNORMAL HIGH (ref 26.0–34.0)
MCHC: 33.7 g/dL (ref 32.0–36.0)
MCV: 101 fL — ABNORMAL HIGH (ref 80–100)
PLATELETS: 172 10*3/uL (ref 150–440)
RBC: 4 10*6/uL (ref 3.80–5.20)
RDW: 13.6 % (ref 11.5–14.5)
WBC: 4.7 10*3/uL (ref 3.6–11.0)

## 2014-02-27 LAB — PHENYTOIN LEVEL, TOTAL: DILANTIN: 15.3 ug/mL (ref 10.0–20.0)

## 2014-03-15 ENCOUNTER — Ambulatory Visit: Payer: Self-pay | Admitting: Ophthalmology

## 2014-03-18 ENCOUNTER — Emergency Department: Payer: Self-pay | Admitting: Emergency Medicine

## 2014-03-18 LAB — BASIC METABOLIC PANEL
Anion Gap: 6 — ABNORMAL LOW (ref 7–16)
BUN: 9 mg/dL (ref 7–18)
Calcium, Total: 8.5 mg/dL (ref 8.5–10.1)
Chloride: 105 mmol/L (ref 98–107)
Co2: 31 mmol/L (ref 21–32)
Creatinine: 0.71 mg/dL (ref 0.60–1.30)
EGFR (African American): >60
EGFR (Non-African Amer.): >60
Glucose: 99 mg/dL (ref 65–99)
Osmolality: 282 (ref 275–301)
Potassium: 3.8 mmol/L (ref 3.5–5.1)
SODIUM: 142 mmol/L (ref 136–145)

## 2014-03-18 LAB — CBC
HCT: 39.5 % (ref 35.0–47.0)
HGB: 13.3 g/dL (ref 12.0–16.0)
MCH: 34.1 pg — AB (ref 26.0–34.0)
MCHC: 33.7 g/dL (ref 32.0–36.0)
MCV: 101 fL — ABNORMAL HIGH (ref 80–100)
Platelet: 179 10*3/uL (ref 150–440)
RBC: 3.9 10*6/uL (ref 3.80–5.20)
RDW: 13.9 % (ref 11.5–14.5)
WBC: 4.5 10*3/uL (ref 3.6–11.0)

## 2014-03-18 LAB — URINALYSIS, COMPLETE
Bacteria: NONE SEEN
Bilirubin,UR: NEGATIVE
GLUCOSE, UR: NEGATIVE mg/dL (ref 0–75)
Ketone: NEGATIVE
LEUKOCYTE ESTERASE: NEGATIVE
Nitrite: NEGATIVE
PROTEIN: NEGATIVE
Ph: 6 (ref 4.5–8.0)
RBC,UR: 1 /HPF (ref 0–5)
SPECIFIC GRAVITY: 1.004 (ref 1.003–1.030)
SQUAMOUS EPITHELIAL: NONE SEEN
WBC UR: NONE SEEN /HPF (ref 0–5)

## 2014-03-18 LAB — PHENYTOIN LEVEL, TOTAL: Dilantin: 15.4 ug/mL (ref 10.0–20.0)

## 2014-04-18 ENCOUNTER — Emergency Department: Payer: Self-pay | Admitting: Emergency Medicine

## 2014-04-18 LAB — CBC
HCT: 40.1 % (ref 35.0–47.0)
HGB: 13.5 g/dL (ref 12.0–16.0)
MCH: 35 pg — AB (ref 26.0–34.0)
MCHC: 33.8 g/dL (ref 32.0–36.0)
MCV: 104 fL — AB (ref 80–100)
PLATELETS: 181 10*3/uL (ref 150–440)
RBC: 3.86 10*6/uL (ref 3.80–5.20)
RDW: 14.6 % — ABNORMAL HIGH (ref 11.5–14.5)
WBC: 5.4 10*3/uL (ref 3.6–11.0)

## 2014-04-18 LAB — BASIC METABOLIC PANEL
ANION GAP: 7 (ref 7–16)
BUN: 10 mg/dL (ref 7–18)
CREATININE: 0.59 mg/dL — AB (ref 0.60–1.30)
Calcium, Total: 8.2 mg/dL — ABNORMAL LOW (ref 8.5–10.1)
Chloride: 102 mmol/L (ref 98–107)
Co2: 29 mmol/L (ref 21–32)
EGFR (African American): >60
EGFR (Non-African Amer.): >60
Glucose: 86 mg/dL (ref 65–99)
OSMOLALITY: 274 (ref 275–301)
POTASSIUM: 5.6 mmol/L — AB (ref 3.5–5.1)
SODIUM: 138 mmol/L (ref 136–145)

## 2014-04-18 LAB — PHENYTOIN LEVEL, TOTAL: DILANTIN: 17.7 ug/mL (ref 10.0–20.0)

## 2014-04-26 ENCOUNTER — Ambulatory Visit: Payer: Self-pay | Admitting: Ophthalmology

## 2014-06-13 ENCOUNTER — Emergency Department: Payer: Self-pay | Admitting: Emergency Medicine

## 2014-06-13 LAB — CBC
HCT: 37.4 % (ref 35.0–47.0)
HGB: 12.4 g/dL (ref 12.0–16.0)
MCH: 34.7 pg — ABNORMAL HIGH (ref 26.0–34.0)
MCHC: 33.2 g/dL (ref 32.0–36.0)
MCV: 104 fL — ABNORMAL HIGH (ref 80–100)
Platelet: 184 10*3/uL (ref 150–440)
RBC: 3.58 10*6/uL — ABNORMAL LOW (ref 3.80–5.20)
RDW: 14.2 % (ref 11.5–14.5)
WBC: 3.7 10*3/uL (ref 3.6–11.0)

## 2014-06-13 LAB — BASIC METABOLIC PANEL
ANION GAP: 8 (ref 7–16)
BUN: 9 mg/dL (ref 7–18)
CALCIUM: 8.4 mg/dL — AB (ref 8.5–10.1)
Chloride: 107 mmol/L (ref 98–107)
Co2: 27 mmol/L (ref 21–32)
Creatinine: 0.77 mg/dL (ref 0.60–1.30)
EGFR (African American): >60
Glucose: 82 mg/dL (ref 65–99)
Osmolality: 281 (ref 275–301)
Potassium: 4.2 mmol/L (ref 3.5–5.1)
SODIUM: 142 mmol/L (ref 136–145)

## 2014-06-13 LAB — TROPONIN I: Troponin-I: <0.02 ng/mL

## 2014-10-03 NOTE — H&P (Signed)
PATIENT NAME:  Marcia Brown, Marcia Brown MR#:  [id number] DATE OF BIRTH:  03-Jul-1955  DATE OF ADMISSION:  07/11/2011  PRIMARY CARE PHYSICIAN: Dr. Brunetta Genera   HISTORY OF PRESENT ILLNESS: Patient is a 59 year old Caucasian female with past medical history significant for history of chronic anemia, was evaluated in our hospital in December 2011 by EGD which was unremarkable for any ulcers, however, she was also admitted for severe anemia at [facility] a year ago sometime around December 2012 where she stayed a few days and unknown procedures were performed at time. Comes back to the hospital after being sent from Dr. Gala Murdoch office when he noted patient to have severe anemia, being pale. Hemoglobin was checked, was found to be low and patient was sent to Emergency Room for further evaluation. Patient denies any significant problems, however, admits of having intermittent nausea and vomiting as well as diarrhea. She states that she has reddish stool as well as reddish vomitus intermittently. She denies any black or tarry looking stools, however, cannot remember much and she says that she does not pay much attention. 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 She admits of having some shortness of breath as well as feeling presyncopal and dizzy especially whenever she sits up which seemed to be better. She is also very weak. She denies any significant problems with appetite. She states that she eats, however, she feels somewhat quivery in her abdomen. She says that she sometimes has right mid and rib cage area discomfort as well as epigastric discomfort intermittently but not able to provide much more history about that.   PAST MEDICAL HISTORY:  1. History of EGD and colonoscopy at [facility] in 2010.  2. History of cholecystectomy,  3. History of EGD in December 2011 by Dr. Dionne Milo which showed hiatal hernia, normal stomach as well as normal  duodenum. 4. History of seizure disorder after trauma to her head. 5. History of depression, anxiety. 6. Chronic obstructive pulmonary disease. 7. Obstructive sleep apnea. 8. Former tobacco abuse. 9. Degenerative joint disease as well as spondylosis. 10. Rheumatoid arthritis.  11. History of gastric ulcers by EGD done in 2010 at [facility].  12. History of anemia requiring blood transfusions.  13. History of NSAID medication-induced gastritis.   ALLERGIES: Ciprofloxacin.  HOME MEDICATIONS: 1. Cefdinir 300 mg p.o. twice daily. 2. Clonazepam 0.5 mg twice daily as needed.  3. Cyclobenzaprine 10 mg t.i.d.  4. Cymbalta 60 mg p.o. daily. 5. Dilantin 200 mg alternating with 300 mg every other day.  6. Hemocyte 324 mg p.o. once daily. 7. Mometasone 0.1% topical cream once daily. 8. Naproxen 500 mg p.o. twice daily. 9. Nexium 40 mg p.o. daily.  10. Simvastatin 40 mg p.o. at bedtime. 11. Spiriva 18 mcg inhalation daily. 12. Sucralfate 1 gram q.i.d.  13. Ventolin 2 puffs q.4 hours as needed.    FAMILY HISTORY: Negative for early coronary artery disease as well as myocardial infarction.   SOCIAL HISTORY: Patient quit tobacco, used to smoke in the past, smoked for 40 years. No alcohol. No illicit drugs. She divorced just recently and now she lives with her niece, power of attorney, Ms. Raechel Ache, telephone [phone]. Is trying to figure out where she should go from her niece's home because she doesn't  have where to live at this point.   REVIEW OF SYSTEMS: CONSTITUTIONAL: Positive for fatigue and weakness, some blurring of vision for which  she uses bifocals, sinus congestion, some difficulty swallowing and choking sometimes on food, some soreness in her throat intermittently. Also cough and dyspnea, shortness of breath seemed to be worsening over period of time. Intermittent chest pains, but no cardiac evaluation in the past especially on exertion, history of presyncope, intermittent pedal edema,  nausea, vomiting, intermittent diarrhea with reddish vomitus as well as stool, intermittent abdominal pains in epigastric area as well as right-sided abdominal pains, also cramping in the calves and feet and jumping legs, also dysuria symptoms. Last seizure was 05/2011. Otherwise, denies any fevers, pains, weight loss or gain. EYES: In regards to eyes, denies any double vision, glaucoma, cataracts. ENT: Denies any tinnitus, allergies, epistaxis, sinus pain. Admits of difficulty swallowing. RESPIRATORY: Denies any wheezes. Admits of chronic obstructive pulmonary disease. CARDIOVASCULAR: Denies any orthopnea, arrhythmias, palpitations. GASTROINTESTINAL: Denies any hematemesis, rectal bleeding, change in bowel habits. GENITOURINARY: Denies hematuria, incontinence. ENDOCRINOLOGY: Denies polydipsia, nocturia, thyroid problems, heat or cold intolerance, or thirst. HEMATOLOGIC: Denies anemia, easy bruising, bleeding, swollen glands. SKIN: Denies any acne, rashes, change in moles. MUSCULOSKELETAL: Denies arthritis, cramps, swelling. NEUROLOGICAL: Denies any numbness, epilepsy, tremor. PSYCH: Denies anxiety, insomnia, or depression, however, admits of lower extremity pains intermittently and admits of seizures a few months ago.  PHYSICAL EXAMINATION: VITAL SIGNS: When patient arrived to Emergency Room patient's vitals: Temperature 98.7, pulse 76, respiration rate 20, blood pressure 116/55, saturation 98% on room air.   GENERAL: This is well-developed, obese Caucasian female, very pale sitting on the stretcher.   HEENT: Her pupils were equal, reactive to light. Extraocular movements intact. No icterus or conjunctivitis. Has normal hearing. No pharyngeal erythema. Mucosa is moist. Patient does have some scarring in the right temporal area as well as weakness on the right side of her face but no significant weakness anywhere in the body.  NECK: Neck did not reveal any masses. Supple, nontender. Thyroid not enlarged. No  adenopathy. No JVD or carotid bruits bilaterally. Full range of motion.    LUNGS: Clear to auscultation in all fields. Diminished breath sounds but otherwise no rales, rhonchi, or wheezing. No labored inspirations, increased effort, dullness to percussion, overt respiratory distress.   CARDIOVASCULAR: S1, S2 appreciated. No murmurs, gallops, rubs noted. PMI not lateralized. Chest is nontender to palpation.   EXTREMITIES: 1+ pedal pulses. No lower extremity edema, calf tenderness or cyanosis noted.   ABDOMEN: Soft, nontender. No hepatosplenomegaly or masses were noted.   RECTAL: Deferred.    MUSCULOSKELETAL: Able to move all extremities. No cyanosis, degenerative joint disease or kyphosis. Gait is not tested.   SKIN: Skin did not reveal any rashes, lesions, erythema, nodularity, induration. It was pale.   LYMPH: No adenopathy in cervical region.   NEUROLOGICAL: As mentioned above, patient does have weakness in the right side of her face. Motor function was unremarkable. Sensory otherwise  was intact. No dysarthria, aphasia.   PSYCH: Patient is alert, oriented to person, and place, not to time. She is cooperative. Memory is impaired. No significant confusion, agitation, depression noted.   LABORATORY, DIAGNOSTIC AND RADIOLOGICAL DATA: BMP showed potassium 3.1, calcium 8.1, albumin level 3.1, total protein 5.9 otherwise unremarkable liver enzymes as well as BMP. Troponin level less than 0.02. Dilantin level 13.5. White blood cell count 3.8, hemoglobin 4.7, platelets 236, MCV low at 73, hematocrit level 15.1, absolute neutrophil count is within normal limits at 2.1. Coagulation panel unremarkable. Guaiac of stool was done by Emergency Room physician, Dr. Cinda Quest, who did not find any stool in  vault and he tried to smear on the testing paper, however, guaiac was negative. EKG is pending. Radiologic studies: None done.   ASSESSMENT AND PLAN:  1. Severe, likely iron deficiency anemia with  microcytic indicis. Admit patient to medical floor. Get [facility] medical records as patient was extensively evaluated over there. Will get also gastroenterologist, Dr. Dionne Milo, to see the patient. Will transfuse patient with packed red blood cells. Will follow up hemoglobin level. No active bleeding at this time. Very likely related to acute on chronic gastrointestinal bleed as patient reports of reddish stool as well as reddish vomitus.  2. Suspected gastrointestinal bleed. Will start patient on Protonix twice daily IV. Will get gastroenterologist, as mentioned above, to see patient. Again, patient was placed on naproxen for arthritis symptoms, however, I don't believe that NSAIDs medications are the best idea to continue. Patient's medications should be changed to something else which may not affect her stomach or gastric wall lining.  3. Hypokalemia. Supplement IV. Check magnesium level.  4. History of seizure disorder. Continue outpatient medications.  5. History of chronic obstructive pulmonary disease. Continue outpatient medications with Ventolin.   6. History of hyperlipidemia. Continue simvastatin. 7. History of anxiety, depression as well as mental retardation. Continue patient on outpatient medications. I am not sure why she is cefdinir. I assume that she was on those medications because of her recent upper respiratory infection. We are going to stop those now.   TIME SPENT: One hour.   ____________________________ Theodoro Grist, MD rv:cms D: 07/11/2011 15:32:43 ET T: 07/11/2011 16:46:10 ET JOB#: [id number]  cc: Theodoro Grist, MD, <Dictator> Meindert A. Brunetta Genera, MD Theodoro Grist MD ELECTRONICALLY SIGNED 08/06/2011 17:04

## 2014-10-03 NOTE — Discharge Summary (Signed)
PATIENT NAME:  Marcia Brown, Marcia Brown MR#:  [id number] DATE OF BIRTH:  21-Jul-1955  DATE OF ADMISSION:  07/11/2011 DATE OF DISCHARGE:  07/13/2011  DISCHARGE DIAGNOSES: 1. Iron deficiency anemia.  2. Hiatal hernia.  3. Hypokalemia. 4. Seizure disorder. 5. Chronic obstructive pulmonary disease. 6. Hyperlipidemia. 7. Anxiety and depression. 8. Former tobacco use.   DISPOSITION: The patient is being discharged home.   DISCHARGE FOLLOWUP: She is to followup with her primary care physician, Dr. Brunetta Genera, and followup with Dr. Rockey Situ in 1 to 2 weeks after discharge.   DIET: Regular.   ACTIVITY: As tolerated.   DISCHARGE MEDICATIONS:  1. Ferrous sulfate 325 mg three times daily. 2. Simvastatin 40 mg daily.  3. Carafate 1 gram four times daily. 4. Klonopin 0.5 mg twice a day p.r.n.  5. Flexeril 10 mg three times daily. 6. Cymbalta 60 mg daily.  7. Dilantin 200 mg once a day alternating with 300 mg once a day.  8. Nexium 40 mg daily.  9. Spiriva 18 mcg inhaled once a day. 10. Ventolin HFA 90 mcg 2 puffs as needed.   CONSULTANT: Verdie Shire, MD - Gastroenterology.   PROCEDURES: Endoscopy showed hiatal hernia, otherwise endoscopy was normal.   RESULTS: Iron studies indicative of anemia of iron deficiency. Hemoglobin 4.7 on admission and 8 by the time of discharge. Potassium 3.1 to 3.4, replaced. The rest of the CMP was normal. Cardiac enzymes negative. Urine drug screen negative.   HOSPITAL COURSE: The patient is a 59 year old female with past medical history of anxiety, depression, seizure disorder, chronic obstructive pulmonary disease, and sleep apnea who presented with symptomatic anemia. On further evaluation, she was found to have severe microcytic anemia. Her hemoglobin was 4.7 on admission. She received multiple blood transfusions and her hemoglobin went up to 8. Iron studies were checked and she was found to have anemia of iron deficiency. A Gastroenterology consultation with Dr. Candace Cruise was obtained.  The patient underwent endoscopy which showed only a hiatal hernia. The patient reported that she had previous colonoscopy, the results of which are not available. She had low magnesium and potassium, which was supplemented. The rest of the patient's medical problems remained stable. She is being discharged home in a stable condition. She should follow with her primary care physician and Dr. Candace Cruise in 1 to 2 weeks after discharge. She is being discharged home in a stable condition. She will follow up with Dr. Candace Cruise and Dr. Brunetta Genera in 1 to 2 weeks after discharge.   TIME SPENT: 45 minutes. ____________________________ Cherre Huger, MD sp:slb D: 07/13/2011 18:11:22 ET T: 07/15/2011 14:33:28 ET JOB#: [id number]  cc: Cherre Huger, MD, <Dictator> Meindert A. Brunetta Genera, MD Cherre Huger MD ELECTRONICALLY SIGNED 07/16/2011 13:14

## 2014-10-03 NOTE — Consult Note (Signed)
PATIENT NAME:  Marcia Brown, Marcia Brown MR#:  [id number] DATE OF BIRTH:  Jun 29, 1955  DATE OF CONSULTATION:  07/12/2011  REFERRING PHYSICIAN:  Dr. Ether Griffins  CONSULTING PHYSICIAN:  Payton Emerald, NP/Dr. Verdie Shire  PRIMARY CARE PHYSICIAN: Dr. Brunetta Genera   INDICATION FOR CONSULTATION: Severe anemia.   HISTORY OF PRESENT ILLNESS: Marcia Brown is a 59 year old Caucasian female with a significant past medical history of chronic anemia, obstructive sleep apnea, chronic obstructive pulmonary disease, depression, anxiety, seizure disorder status post head trauma and known history of gastric ulcer which was noted an EGD 2010. History of EGD December 2011 performed by Dr. Dionne Milo with finding of hiatal hernia, normal stomach and normal duodenum. Patient was seen by Dr. Brunetta Genera yesterday prior to her admission. Concern existed for her looking pale. Hemoglobin was checked and results were 4.7. According to patient, hemoglobin several weeks prior to that was 8. Patient is currently on Hemocyte 324 mg once a day. She has been experiencing dizziness, shortness of breath and chest pain intermittently for the past month, very tired, fatigued as well, nauseated with episodes of vomiting every day or every other day up until recently. Abdominal pain intermittently to umbilicus. Denies any abdominal bloating or distention. Excessive belching. Weight seems to go up and down. Has had decreased p.o. caloric intake. Bowel habit is usually regular but did have one day of experiencing diarrhea several times a day, color of stool was black. Associated heartburn, will take Equate over the counter antacid reducer like Alka-Seltzer when needed. Has required it several times over the past several weeks. Denies any aspirin use or BC powders, Goody powders. Significant for naproxen 500 mg 1 tablet twice a day. Since September 2012 she has been experiencing bilateral lower extremity edema intermittently. She does know that she has been so weak that  ambulating from living room to kitchen she becomes weak and dizzy, she turn around go back to the living room and there have been days where she does not eat as she cannot get to the kitchen with ease.   CURRENT MEDICATIONS AT HOME: 1. Cefdinir 300 mg twice a day. 2. Clonazepam 0.5 mg twice a day as needed.  3. Cyclobenzaprine 10 mg 3 times a day. 4. Cymbalta 60 mg a day. 5. Dilantin 200 mg alternating with 300 mg every other day.  6. Hemocyte 324 mg once a day.  7. Mometasone 0.1% topical cream once a day. 8. Naproxen 500 mg twice a day. 9. Nexium 40 mg a day. 10. Simvastatin 40 mg at bedtime.  11. Spiriva 18 mcg inhalation daily. 12. Carafate 1 gram by mouth 4 times a day. 13. Ventolin 2 puffs every four hours as needed.   ALLERGIES: Ciprofloxacin.   PAST MEDICAL/SURGICAL HISTORY:  1. History of EGD and colonoscopy at [facility]. Denies polyps being removed.  2. History of cholecystectomy. 3. History of EGD December 2011 by Dr. Dionne Milo which showed hiatal hernia, normal stomach and normal duodenum.  4. History of seizure disorder after trauma to head 1992.  5. History of depression, anxiety. 6. Chronic obstructive pulmonary disease. 7. Obstructive sleep apnea. 8. Tobacco abuse. 9. Degenerative joint disease with spondylosis. 10. Rheumatoid arthritis. 11. History of gastric ulcer noted on EGD 2010.  12. Tonsillectomy.  13. Heart murmur. 14. Two myocardial infarctions. 15. History of chronic anemia requiring three blood transfusions since 2011. 16. History of NSAID medication-induced gastritis.   FAMILY HISTORY: Daughter history of breast cancer. Niece breast cancer. No family history of colon cancer or colonic  polyps, IBD, celiac disease.   SOCIAL HISTORY: Smoking 2 to 3 cigarettes a day, use of tobacco intermittently for the past 40 years. No alcohol. No recreational drug use. Recently divorced.    REVIEW OF SYSTEMS: CONSTITUTIONAL: Significant for fatigue, weakness. HEENT:  Some blurred vision. Does wear glasses. No double vision, glaucoma or cataracts. Denies any tinnitus, Allergies, epistaxis, or sinus pain. Significant for dysphagia. RESPIRATORY: Denies any wheezing. Significant for shortness of breath especially with exertion. Known history of chronic obstructive pulmonary disease. CARDIOVASCULAR: No orthopnea, arrhythmias or palpitations. Significant for chest pain in correlation with anemia. GASTROINTESTINAL: See history of present illness. GENITOURINARY: Denies any hematuria, urinary incontinence. ENDOCRINE: Denies any polydipsia, nocturia, thyroid conditions, heat or cold intolerance or thirst. HEMATOLOGICAL: Chronic history of anemia. Denies significant easy bruising and bleeding. SKIN: No rashes. No lesions. Significant for change in moles, was to have a mole excision done day of her admission. MUSCULOSKELETAL: Known history of rheumatoid arthritis as well as degenerative joint disease with spondylosis. PSYCH: History of anxiety, depression. EXTREMITIES: Significant for bilateral lower extremity edema intermittently for the past month.   PHYSICAL EXAMINATION:  VITAL SIGNS: Temperature 98.4, pulse 88, respirations 18, blood pressure 143/74, pulse oximetry 98% on room air.   GENERAL: Well developed, overweight 59 year old Caucasian female, no acute distress noted, pleasant, tolerating clear liquid diet well.   HEENT: Normocephalic, atraumatic. Pupils equal, reactive to light. Conjunctivae clear. Sclerae anicteric. Conjunctivae pale.   NECK: Supple. Trachea midline. No lymphadenopathy, thyromegaly.   PULMONARY: Symmetric rise and fall of chest. Clear to auscultation throughout.   CARDIOVASCULAR: Regular rate, rhythm, S1, S2. No murmurs, no gallops.   ABDOMEN: Soft, nondistended. Bowel sounds in four quadrants. Mild discomfort throughout entire abdomen.   RECTAL: Deferred. Rectal exam performed during ER evaluation, according to patient negative Hemoccult  evaluation.   MUSCULOSKELETAL: Moving all four extremities. No contractures. No clubbing. Gait not tested.   SKIN: Color pale, warm, dry. No lesions. No rashes, jaundice or cyanosis.   EXTREMITIES: No edema.   PSYCH: Alert and oriented x4. Memory slightly impaired but no evidence of agitation, anxiety, flat affect, depressive mood.   NEUROLOGIC: Weakness to right side of her face, slight drooping, otherwise no gross focal defects.   LABORATORY, DIAGNOSTIC, AND RADIOLOGICAL DATA: Chemistry panel on admission within normal limits except potassium low at 3.1 calcium low at 8.1, magnesium 1.7. Ferritin level was 3 with serum iron 23. Today's date for comparison. BUN declined from 8 to 6, potassium improved from 3.1 to 3.4 and calcium remained at 8.1. Hepatic panel: Total protein 5.9 on admission with albumin of 3.0. Today's date total protein 5.5 with an albumin 2.8. Troponin three in series all less than 0.02. Dilantin level on admission 13.5, today 11.1. Urine drug screen within normal limits. CBC on admission: RBC 2.06, hemoglobin 4.7, hematocrit 15.1. MCV 73, MCH 22.7, MCHC is 31.0, RDW 20.3. Patient has received blood transfusion 3 units of packed red blood cells infused. Hemoglobin after transfusions 8.0 with hematocrit 24.6.   PT on admission 13.2 with an INR of 1.0 and PTT 27.9. EKG revealed normal sinus rhythm.   IMPRESSION:  1. Acute symptomatic anemia on top of a known history of chronic iron deficiency anemia.   2. Known history of gastric ulcers and seizure disorder related to head injury.   PLAN: Patient's presentation was discussed with Dr. Verdie Shire. Recommendation is to proceed with continued serial monitoring of hemoglobin and hematocrit, transfuse as necessary. Continue PPI therapy as ordered, n.p.o.  status was placed earlier this afternoon approximately 1:30 p.m. with the consideration of possibly proceeding with an EGD this afternoon to allow direct luminal evaluation of upper  gastrointestinal tract to assess for source of active blood loss. Unfortunately, nursing staff notified Dr. Candace Cruise later after patient having been seen by myself for concern of possible seizure activity. Will not proceed with EGD this afternoon. Will continue to monitor patient and readdress possibility of endoscopic evaluation tomorrow morning.   These services were provided by myself under collaborative agreement with Dr. Verdie Shire.  ____________________________ Payton Emerald, NP dsh:cms D: 07/12/2011 15:00:00 ET T: 07/13/2011 12:16:13 ET  JOB#: [id number] cc: Payton Emerald, NP, <Dictator> Payton Emerald MD ELECTRONICALLY SIGNED 07/14/2011 14:18

## 2014-10-03 NOTE — Consult Note (Signed)
Brief Consult Note: Diagnosis: Acute anemia on top of known history of chronic iron deficiency anemia.  Known history of gastric ulcers.  Seizure disorder related to head trauma.   Consult note dictated.   Recommend to proceed with surgery or procedure.   Recommend further assessment or treatment.   Orders entered.   Discussed with Attending MD.   Comments: Patient's presentation was discussed with Dr. Verdie Shire.  Recommendation is for continued serial monitoring of H/H.  Transfuse as necessary.  Continue PPI therapy as ordered.  NPO status was placed earlier this afternoon at @ 1:30 pm with the consideration of possibly proceeding with EGD this afternoon to allow direct luminal evalaution of upper GI tract to assess for source of active blood loss.  Unfortunately nursing staff notified Dr. Candace Cruise of possible seizure active after patient had been seen by myself.  Will not proceed with EGD this afternoon.  Will continue to monitor and readdress endoscopic evalaution tomorrow morning.  Electronic Signatures: Payton Emerald (NP)  (Signed 31-Jan-13 15:00)  Authored: Brief Consult Note   Last Updated: 31-Jan-13 15:00 by Payton Emerald (NP)

## 2014-10-03 NOTE — Consult Note (Signed)
EGD only showed a large hiatal hernia. No obvious cause of Fe def anemia. Duodenal bx taken for celiac sprue. Please get [facility] or [facility] report from 2010 to confirm normal findings. If colon also normal, consider non GI causes of anemia if stool heme neg. Thanks   Electronic Signatures: Verdie Shire (MD) (Signed on 01-Feb-13 16:07)  Authored   Last Updated: 01-Feb-13 16:09 by Verdie Shire (MD)

## 2014-10-03 NOTE — Consult Note (Signed)
See Dawn Harrison's notes. Had GU's and normal colon at [facility] in 2010. Questionable sz activity this afternoon? Will plan EGD tomorrow afternoon. Thanks  Electronic Signatures: Verdie Shire (MD)  (Signed on 31-Jan-13 19:18)  Authored  Last Updated: 31-Jan-13 19:18 by Verdie Shire (MD)

## 2015-01-04 ENCOUNTER — Emergency Department
Admission: EM | Admit: 2015-01-04 | Discharge: 2015-01-04 | Disposition: A | Discharge status: Home / Self Care | Payer: Medicaid Other | Attending: Emergency Medicine | Admitting: Emergency Medicine

## 2015-01-04 DIAGNOSIS — G40909 Epilepsy, unspecified, not intractable, without status epilepticus: Secondary | ICD-10-CM | POA: Diagnosis not present

## 2015-01-04 DIAGNOSIS — R630 Anorexia: Secondary | ICD-10-CM | POA: Insufficient documentation

## 2015-01-04 DIAGNOSIS — R569 Unspecified convulsions: Secondary | ICD-10-CM | POA: Diagnosis present

## 2015-01-04 HISTORY — DX: Chronic obstructive pulmonary disease, unspecified: J44.9

## 2015-01-04 HISTORY — DX: Unspecified convulsions: R56.9

## 2015-01-04 HISTORY — DX: Pure hypercholesterolemia, unspecified: E78.00

## 2015-01-04 HISTORY — DX: Sleep apnea, unspecified: G47.30

## 2015-01-04 LAB — BASIC METABOLIC PANEL
ANION GAP: 9 (ref 5–15)
BUN: 12 mg/dL (ref 6–20)
CALCIUM: 8.6 mg/dL — AB (ref 8.9–10.3)
CO2: 26 mmol/L (ref 22–32)
CREATININE: 0.79 mg/dL (ref 0.44–1.00)
Chloride: 100 mmol/L — ABNORMAL LOW (ref 101–111)
GFR calc Af Amer: >60 mL/min (ref >60)
Glucose, Bld: 154 mg/dL — ABNORMAL HIGH (ref 65–99)
Potassium: 3.5 mmol/L (ref 3.5–5.1)
Sodium: 135 mmol/L (ref 135–145)

## 2015-01-04 LAB — CBC WITH DIFFERENTIAL/PLATELET
Basophils Absolute: 0 10*3/uL (ref 0–0.1)
Basophils Relative: 0 %
Eosinophils Absolute: 0.2 10*3/uL (ref 0–0.7)
Eosinophils Relative: 3 %
HCT: 37 % (ref 35.0–47.0)
Hemoglobin: 12.9 g/dL (ref 12.0–16.0)
LYMPHS ABS: 1.4 10*3/uL (ref 1.0–3.6)
Lymphocytes Relative: 26 %
MCH: 35.1 pg — ABNORMAL HIGH (ref 26.0–34.0)
MCHC: 34.8 g/dL (ref 32.0–36.0)
MCV: 100.9 fL — ABNORMAL HIGH (ref 80.0–100.0)
Monocytes Absolute: 0.6 10*3/uL (ref 0.2–0.9)
Monocytes Relative: 10 %
Neutro Abs: 3.4 10*3/uL (ref 1.4–6.5)
Neutrophils Relative %: 61 %
Platelets: 184 10*3/uL (ref 150–440)
RBC: 3.67 MIL/uL — AB (ref 3.80–5.20)
RDW: 13.8 % (ref 11.5–14.5)
WBC: 5.6 10*3/uL (ref 3.6–11.0)

## 2015-01-04 MED ORDER — SODIUM CHLORIDE 0.9 % IV BOLUS (SEPSIS)
1000.0000 mL | Freq: Once | INTRAVENOUS | Status: AC
  Administered 2015-01-04: 1000 mL via INTRAVENOUS

## 2015-01-04 NOTE — ED Notes (Signed)
Called [facility] to notify them that the pt was getting ready for d/c and would need transportation back to their facility.  A woman named Tonia Ghent answered the phone and connected me to another lady named Lattie Haw. When informed that the pt needed transportation back to the facility, Lattie Haw stated, "Really? She hasn't been there all that long. Can I have an update on her condition?"  This RN informed Lattie Haw that based on lab work and the [employer] assessment, that it was determined that the cause of the pt's sx's were most likely a result of her not taking her medications as prescribed, as we were informed she had missed scheduled doses as a result of her medications running out prior to being refilled. Lattie Haw stated that the pt would need EMS to return the pt as no one was available to come pick her up from the hospital.  Joni Fears, MD made aware of the pt's transportation issues and the need for the pt to be transported via EMS.

## 2015-01-04 NOTE — Discharge Instructions (Signed)
You were seen in the ER today for a seizure. Your blood tests do not show any significant abnormalities. This may be related to running out of your medication and not being able to take it yesterday morning. Please ensure that you always have all your medications at all times and take them as prescribed. Follow up with your primary care doctor and neurologist as needed.  Seizure, Adult A seizure is abnormal electrical activity in the brain. Seizures usually last from 30 seconds to 2 minutes. There are various types of seizures. Before a seizure, you may have a warning sensation (aura) that a seizure is about to occur. An aura may include the following symptoms:   Fear or anxiety.  Nausea.  Feeling like the room is spinning (vertigo).  Vision changes, such as seeing flashing lights or spots. Common symptoms during a seizure include:  A change in attention or behavior (altered mental status).  Convulsions with rhythmic jerking movements.  Drooling.  Rapid eye movements.  Grunting.  Loss of bladder and bowel control.  Bitter taste in the mouth.  Tongue biting. After a seizure, you may feel confused and sleepy. You may also have an injury resulting from convulsions during the seizure. HOME CARE INSTRUCTIONS   If you are given medicines, take them exactly as prescribed by your health care provider.  Keep all follow-up appointments as directed by your health care provider.  Do not swim or drive or engage in risky activity during which a seizure could cause further injury to you or others until your health care provider says it is OK.  Get adequate rest.  Teach friends and family what to do if you have a seizure. They should:  Lay you on the ground to prevent a fall.  Put a cushion under your head.  Loosen any tight clothing around your neck.  Turn you on your side. If vomiting occurs, this helps keep your airway clear.  Stay with you until you recover.  Know whether or  not you need emergency care. SEEK IMMEDIATE MEDICAL CARE IF:  The seizure lasts longer than 5 minutes.  The seizure is severe or you do not wake up immediately after the seizure.  You have an altered mental status after the seizure.  You are having more frequent or worsening seizures. Someone should drive you to the emergency department or call local emergency services (911 in [place] MAKE SURE YOU:  Understand these instructions.  Will watch your condition.  Will get help right away if you are not doing well or get worse. Document Released: 05/25/2000 Document Revised: 03/18/2013 Document Reviewed: 01/07/2013 [facility] Patient Information 2015 [place], [place]. This information is not intended to replace advice given to you by your health care provider. Make sure you discuss any questions you have with your health care provider.  Driving and Equipment Restrictions Some medical problems make it dangerous to drive, ride a bike, or use machines. Some of these problems are:  A hard blow to the head (concussion).  Passing out (fainting).  Twitching and shaking (seizures).  Low blood sugar.  Taking medicine to help you relax (sedatives).  Taking pain medicines.  Wearing an eye patch.  Wearing splints. This can make it hard to use parts of your body that you need to drive safely. HOME CARE   Do not drive until your doctor says it is okay.  Do not use machines until your doctor says it is okay. You may need a form signed by your doctor (  medical release) before you can drive again. You may also need this form before you do other tasks where you need to be fully alert. MAKE SURE YOU:  Understand these instructions.  Will watch your condition.  Will get help right away if you are not doing well or get worse. Document Released: 07/05/2004 Document Revised: 08/20/2011 Document Reviewed: 10/05/2009 [facility] Patient Information 2015 [place], [place]. This information is not  intended to replace advice given to you by your health care provider. Make sure you discuss any questions you have with your health care provider.

## 2015-01-04 NOTE — ED Provider Notes (Signed)
[facility] Emergency Department Provider Note  ____________________________________________  Time seen: 6:45 PM on arrival by EMS  I have reviewed the triage vital signs and the nursing notes.   HISTORY  Chief Complaint Seizures    HPI Marcia Brown is a 59 y.o. female who sent to the ED due to a seizure. It is reported that while she was eating dinner at her assisted living facility, she was observed to have a seizure. The patient does not recall any seizure or loss of consciousness or fall. As far she can tell she's been in her usual state of health all day. EMS report that since their arrival on scene the patient has been awake alert oriented and at her apparent baseline without any distress or complaints.  Patient reports she has had decreased oral intake the last 2 days due to a loss of appetite and has been going outside. The outside temperatures have been in the 90s last several days. Denies chest pain shortness of breath abdominal pain back pain dysuria frequency urgency cough fever chills.  No numbness tingling or weakness. She has chronic headaches which are unchanged.      Past Medical History  Diagnosis Date  . COPD (chronic obstructive pulmonary disease)   . Seizures   . Sleep apnea   . High cholesterol     There are no active problems to display for this patient.   No past surgical history on file.  No current outpatient prescriptions on file. Medication reconciliation on chart, includes Keppra and phenytoin. Administration record reveals that the patient was not given her Keppra dose yesterday morning  Allergies Review of patient's allergies indicates no known allergies.  No family history on file.  Social History History  Substance Use Topics  . Smoking status: Not on file  . Smokeless tobacco: Not on file  . Alcohol Use: Not on file    Review of Systems  Constitutional: No fever or chills. No weight changes Eyes:No blurry  vision or double vision.  ENT: No sore throat. Cardiovascular: No chest pain. Respiratory: No dyspnea or cough. Gastrointestinal: Negative for abdominal pain, vomiting and diarrhea.  No BRBPR or melena. Genitourinary: Negative for dysuria, urinary retention, bloody urine, or difficulty urinating.Decreased appetite 2 days  Musculoskeletal: Negative for back pain. No joint swelling or pain. Skin: Negative for rash. Neurological: Negative for headaches, focal weakness or numbness. Psychiatric:No anxiety or depression.   Endocrine:No hot/cold intolerance, changes in energy, or sleep difficulty.  10-point ROS otherwise negative.  ____________________________________________   PHYSICAL EXAM:  VITAL SIGNS: ED Triage Vitals  Enc Vitals Group     BP --      Pulse Rate 01/04/15 1905 70     Resp --      Temp 01/04/15 1905 98.5 F (36.9 C)     Temp Source 01/04/15 1905 Oral     SpO2 01/04/15 1905 95 %     Weight 01/04/15 1905 200 lb (90.719 kg)     Height 01/04/15 1905 5\' 3"  (1.6 m)     Head Cir --      Peak Flow --      Pain Score 01/04/15 1906 0     Pain Loc --      Pain Edu? --      Excl. in [place]? --      Constitutional: Alert and oriented. Well appearing and in no distress. Eyes: No scleral icterus. No conjunctival pallor. PERRL. EOMI ENT   Head: Normocephalic and  atraumatic.   Nose: No congestion/rhinnorhea. No septal hematoma   Mouth/Throat: MMM, no pharyngeal erythema. No peritonsillar mass. No uvula shift.   Neck: No stridor. No SubQ emphysema. No meningismus. Hematological/Lymphatic/Immunilogical: No cervical lymphadenopathy. Cardiovascular: RRR. Normal and symmetric distal pulses are present in all extremities. No murmurs, rubs, or gallops. Respiratory: Normal respiratory effort without tachypnea nor retractions. Breath sounds are clear and equal bilaterally. No wheezes/rales/rhonchi. Gastrointestinal: Soft and nontender. No distention. There is no CVA  tenderness.  No rebound, rigidity, or guarding. Genitourinary: deferred Musculoskeletal: Nontender with normal range of motion in all extremities. No joint effusions.  No lower extremity tenderness.  No edema. Neurologic:   Normal speech and language.  CN 2-10 normal. Motor grossly intact. No pronator drift.  Normal gait. No gross focal neurologic deficits are appreciated.  Skin:  Skin is warm, dry and intact. No rash noted.  No petechiae, purpura, or bullae. Psychiatric: Mood and affect are normal. Speech and behavior are normal. Patient exhibits appropriate insight and judgment.  ____________________________________________    LABS (pertinent positives/negatives) (all labs ordered are listed, but only abnormal results are displayed) Labs Reviewed  BASIC METABOLIC PANEL  CBC WITH DIFFERENTIAL/PLATELET   ____________________________________________   EKG    ____________________________________________    RADIOLOGY    ____________________________________________   PROCEDURES  ____________________________________________   INITIAL IMPRESSION / [place] / ED COURSE  Pertinent labs & imaging results that were available during my care of the patient were reviewed by me and considered in my medical decision making (see chart for details).  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 This may be related to dehydration and electrolyte disturbance, or may be that her antiepileptics are subtherapeutic due to not having her doses yesterday morning. She states that she noticed yesterday that the facility seemed to have run out of her medications but they were able to give it to her this morning. We'll check electrolytes and give IV fluids, and plan to discharge home if workup is unremarkable.  ____________________________________________   FINAL CLINICAL IMPRESSION(S) /  ED DIAGNOSES  Final diagnoses:  Seizure      Carrie Mew, MD 01/04/15 [phone]

## 2015-01-20 ENCOUNTER — Other Ambulatory Visit: Payer: Self-pay | Admitting: Podiatry

## 2015-01-20 DIAGNOSIS — Z1231 Encounter for screening mammogram for malignant neoplasm of breast: Secondary | ICD-10-CM

## 2015-02-03 ENCOUNTER — Emergency Department: Payer: Medicaid Other

## 2015-02-03 ENCOUNTER — Encounter: Payer: Self-pay | Admitting: Emergency Medicine

## 2015-02-03 ENCOUNTER — Inpatient Hospital Stay
Admission: EM | Admit: 2015-02-03 | Discharge: 2015-02-04 | DRG: 101 | Payer: Medicaid Other | Attending: Internal Medicine | Admitting: Internal Medicine

## 2015-02-03 DIAGNOSIS — G40901 Epilepsy, unspecified, not intractable, with status epilepticus: Principal | ICD-10-CM | POA: Diagnosis present

## 2015-02-03 DIAGNOSIS — E780 Pure hypercholesterolemia: Secondary | ICD-10-CM | POA: Diagnosis present

## 2015-02-03 DIAGNOSIS — J441 Chronic obstructive pulmonary disease with (acute) exacerbation: Secondary | ICD-10-CM | POA: Diagnosis not present

## 2015-02-03 DIAGNOSIS — R569 Unspecified convulsions: Secondary | ICD-10-CM | POA: Diagnosis present

## 2015-02-03 DIAGNOSIS — E785 Hyperlipidemia, unspecified: Secondary | ICD-10-CM | POA: Diagnosis present

## 2015-02-03 DIAGNOSIS — Z87820 Personal history of traumatic brain injury: Secondary | ICD-10-CM

## 2015-02-03 DIAGNOSIS — G4730 Sleep apnea, unspecified: Secondary | ICD-10-CM | POA: Diagnosis present

## 2015-02-03 DIAGNOSIS — Z720 Tobacco use: Secondary | ICD-10-CM | POA: Diagnosis not present

## 2015-02-03 DIAGNOSIS — Z79899 Other long term (current) drug therapy: Secondary | ICD-10-CM | POA: Diagnosis not present

## 2015-02-03 DIAGNOSIS — F17210 Nicotine dependence, cigarettes, uncomplicated: Secondary | ICD-10-CM | POA: Diagnosis present

## 2015-02-03 DIAGNOSIS — J449 Chronic obstructive pulmonary disease, unspecified: Secondary | ICD-10-CM | POA: Diagnosis present

## 2015-02-03 DIAGNOSIS — R42 Dizziness and giddiness: Secondary | ICD-10-CM | POA: Diagnosis present

## 2015-02-03 LAB — COMPREHENSIVE METABOLIC PANEL
ALT: 35 U/L (ref 14–54)
AST: 39 U/L (ref 15–41)
Albumin: 4.4 g/dL (ref 3.5–5.0)
Alkaline Phosphatase: 74 U/L (ref 38–126)
Anion gap: 10 (ref 5–15)
BUN: 10 mg/dL (ref 6–20)
CALCIUM: 9.3 mg/dL (ref 8.9–10.3)
CHLORIDE: 103 mmol/L (ref 101–111)
CO2: 27 mmol/L (ref 22–32)
Creatinine, Ser: 0.81 mg/dL (ref 0.44–1.00)
Glucose, Bld: 114 mg/dL — ABNORMAL HIGH (ref 65–99)
Potassium: 4.1 mmol/L (ref 3.5–5.1)
Sodium: 140 mmol/L (ref 135–145)
Total Bilirubin: 0.2 mg/dL — ABNORMAL LOW (ref 0.3–1.2)
Total Protein: 7.7 g/dL (ref 6.5–8.1)

## 2015-02-03 LAB — CBC
HEMATOCRIT: 41 % (ref 35.0–47.0)
Hemoglobin: 14.1 g/dL (ref 12.0–16.0)
MCH: 35.1 pg — AB (ref 26.0–34.0)
MCHC: 34.4 g/dL (ref 32.0–36.0)
MCV: 101.8 fL — AB (ref 80.0–100.0)
PLATELETS: 172 10*3/uL (ref 150–440)
RBC: 4.03 MIL/uL (ref 3.80–5.20)
RDW: 14 % (ref 11.5–14.5)
WBC: 5.9 10*3/uL (ref 3.6–11.0)

## 2015-02-03 LAB — MRSA PCR SCREENING: MRSA by PCR: NEGATIVE

## 2015-02-03 MED ORDER — ALBUTEROL SULFATE (2.5 MG/3ML) 0.083% IN NEBU
2.5000 mg | INHALATION_SOLUTION | Freq: Four times a day (QID) | RESPIRATORY_TRACT | Status: DC
  Administered 2015-02-04: 2.5 mg via RESPIRATORY_TRACT
  Filled 2015-02-03: qty 3

## 2015-02-03 MED ORDER — ONDANSETRON HCL 4 MG PO TABS: 4.0000 mg | ORAL_TABLET | Freq: Four times a day (QID) | ORAL | Status: DC | PRN

## 2015-02-03 MED ORDER — ALUM & MAG HYDROXIDE-SIMETH 200-200-20 MG/5ML PO SUSP: 30.0000 mL | Freq: Four times a day (QID) | ORAL | Status: DC | PRN

## 2015-02-03 MED ORDER — DULOXETINE HCL 30 MG PO CPEP
60.0000 mg | ORAL_CAPSULE | Freq: Every day | ORAL | Status: DC
  Administered 2015-02-03 – 2015-02-04 (×2): 60 mg via ORAL
  Filled 2015-02-03 (×2): qty 2

## 2015-02-03 MED ORDER — ONDANSETRON HCL 4 MG PO TABS: 4.0000 mg | ORAL_TABLET | Freq: Three times a day (TID) | ORAL | Status: DC | PRN

## 2015-02-03 MED ORDER — ACETAMINOPHEN 650 MG RE SUPP: 650.0000 mg | Freq: Four times a day (QID) | RECTAL | Status: DC | PRN

## 2015-02-03 MED ORDER — ENOXAPARIN SODIUM 40 MG/0.4ML ~~LOC~~ SOLN
40.0000 mg | SUBCUTANEOUS | Status: DC
  Administered 2015-02-03: 40 mg via SUBCUTANEOUS
  Filled 2015-02-03: qty 0.4

## 2015-02-03 MED ORDER — SODIUM CHLORIDE 0.9 % IJ SOLN
3.0000 mL | Freq: Two times a day (BID) | INTRAMUSCULAR | Status: DC
  Administered 2015-02-03 – 2015-02-04 (×3): 3 mL via INTRAVENOUS

## 2015-02-03 MED ORDER — FERROUS SULFATE 325 (65 FE) MG PO TABS
325.0000 mg | ORAL_TABLET | Freq: Three times a day (TID) | ORAL | Status: DC
  Administered 2015-02-03 – 2015-02-04 (×3): 325 mg via ORAL
  Filled 2015-02-03 (×3): qty 1

## 2015-02-03 MED ORDER — POLYVINYL ALCOHOL 1.4 % OP SOLN
2.0000 [drp] | Freq: Two times a day (BID) | OPHTHALMIC | Status: DC
  Administered 2015-02-03 – 2015-02-04 (×2): 2 [drp] via OPHTHALMIC
  Filled 2015-02-03: qty 15

## 2015-02-03 MED ORDER — GABAPENTIN 100 MG PO CAPS
200.0000 mg | ORAL_CAPSULE | Freq: Two times a day (BID) | ORAL | Status: DC
  Administered 2015-02-03 – 2015-02-04 (×3): 200 mg via ORAL
  Filled 2015-02-03 (×3): qty 2

## 2015-02-03 MED ORDER — OXYCODONE-ACETAMINOPHEN 5-325 MG PO TABS: 1.0000 | ORAL_TABLET | Freq: Two times a day (BID) | ORAL | Status: DC | PRN

## 2015-02-03 MED ORDER — ONDANSETRON HCL 4 MG/2ML IJ SOLN: 4.0000 mg | Freq: Four times a day (QID) | INTRAMUSCULAR | Status: DC | PRN

## 2015-02-03 MED ORDER — LEVETIRACETAM 750 MG PO TABS
750.0000 mg | ORAL_TABLET | Freq: Two times a day (BID) | ORAL | Status: DC
  Administered 2015-02-03 (×2): 750 mg via ORAL
  Filled 2015-02-03 (×2): qty 1

## 2015-02-03 MED ORDER — LORAZEPAM 2 MG/ML IJ SOLN
INTRAMUSCULAR | Status: AC
  Administered 2015-02-03: 2 mg via INTRAMUSCULAR
  Filled 2015-02-03: qty 1

## 2015-02-03 MED ORDER — ALBUTEROL SULFATE (2.5 MG/3ML) 0.083% IN NEBU
3.0000 mL | INHALATION_SOLUTION | RESPIRATORY_TRACT | Status: DC | PRN
  Administered 2015-02-03 (×2): 3 mL via RESPIRATORY_TRACT

## 2015-02-03 MED ORDER — CLONAZEPAM 0.5 MG PO TABS: 0.5000 mg | ORAL_TABLET | Freq: Three times a day (TID) | ORAL | Status: DC | PRN

## 2015-02-03 MED ORDER — ATORVASTATIN CALCIUM 10 MG PO TABS
10.0000 mg | ORAL_TABLET | Freq: Every day | ORAL | Status: DC
  Administered 2015-02-04: 10 mg via ORAL
  Filled 2015-02-03: qty 1

## 2015-02-03 MED ORDER — SENNOSIDES-DOCUSATE SODIUM 8.6-50 MG PO TABS: 1.0000 | ORAL_TABLET | Freq: Every evening | ORAL | Status: DC | PRN

## 2015-02-03 MED ORDER — FOLIC ACID 1 MG PO TABS
1.0000 mg | ORAL_TABLET | Freq: Every day | ORAL | Status: DC
Start: 2015-02-03 — End: 2015-02-04
  Administered 2015-02-03 – 2015-02-04 (×2): 1 mg via ORAL
  Filled 2015-02-03 (×2): qty 1

## 2015-02-03 MED ORDER — ACETAMINOPHEN 325 MG PO TABS: 650.0000 mg | ORAL_TABLET | Freq: Four times a day (QID) | ORAL | Status: DC | PRN

## 2015-02-03 MED ORDER — LACOSAMIDE 50 MG PO TABS
100.0000 mg | ORAL_TABLET | Freq: Two times a day (BID) | ORAL | Status: DC
  Administered 2015-02-04: 100 mg via ORAL
  Filled 2015-02-03: qty 2

## 2015-02-03 MED ORDER — LORAZEPAM 2 MG/ML IJ SOLN
2.0000 mg | Freq: Once | INTRAMUSCULAR | Status: AC
  Administered 2015-02-03: 2 mg via INTRAMUSCULAR

## 2015-02-03 MED ORDER — MOMETASONE FURO-FORMOTEROL FUM 100-5 MCG/ACT IN AERO
2.0000 | INHALATION_SPRAY | Freq: Two times a day (BID) | RESPIRATORY_TRACT | Status: DC
  Administered 2015-02-03 – 2015-02-04 (×2): 2 via RESPIRATORY_TRACT
  Filled 2015-02-03: qty 8.8

## 2015-02-03 MED ORDER — LORAZEPAM 2 MG/ML IJ SOLN
INTRAMUSCULAR | Status: AC
  Administered 2015-02-03: 1 mg via INTRAVENOUS
  Filled 2015-02-03: qty 1

## 2015-02-03 MED ORDER — SODIUM CHLORIDE 0.9 % IV SOLN
200.0000 mg | Freq: Once | INTRAVENOUS | Status: AC
  Administered 2015-02-03: 200 mg via INTRAVENOUS
  Filled 2015-02-03: qty 20

## 2015-02-03 MED ORDER — LACOSAMIDE 50 MG PO TABS
50.0000 mg | ORAL_TABLET | Freq: Two times a day (BID) | ORAL | Status: DC
  Administered 2015-02-03 (×2): 50 mg via ORAL
  Filled 2015-02-03 (×2): qty 1

## 2015-02-03 MED ORDER — PANTOPRAZOLE SODIUM 40 MG PO TBEC
40.0000 mg | DELAYED_RELEASE_TABLET | Freq: Every day | ORAL | Status: DC
  Administered 2015-02-03 – 2015-02-04 (×2): 40 mg via ORAL
  Filled 2015-02-03 (×2): qty 1

## 2015-02-03 MED ORDER — CYCLOBENZAPRINE HCL 10 MG PO TABS
5.0000 mg | ORAL_TABLET | Freq: Every day | ORAL | Status: DC
  Administered 2015-02-03 – 2015-02-04 (×2): 5 mg via ORAL
  Filled 2015-02-03: qty 1
  Filled 2015-02-03: qty 2

## 2015-02-03 MED ORDER — LORAZEPAM 2 MG/ML IJ SOLN
1.0000 mg | Freq: Once | INTRAMUSCULAR | Status: AC
  Administered 2015-02-03: 1 mg via INTRAVENOUS

## 2015-02-03 MED ORDER — PHENYTOIN SODIUM EXTENDED 100 MG PO CAPS
100.0000 mg | ORAL_CAPSULE | Freq: Two times a day (BID) | ORAL | Status: DC
  Administered 2015-02-03 – 2015-02-04 (×3): 100 mg via ORAL
  Filled 2015-02-03 (×4): qty 1

## 2015-02-03 MED ORDER — LEVETIRACETAM 500 MG PO TABS
1000.0000 mg | ORAL_TABLET | Freq: Two times a day (BID) | ORAL | Status: DC
  Administered 2015-02-04: 1000 mg via ORAL
  Filled 2015-02-03: qty 2

## 2015-02-03 MED ORDER — ALBUTEROL SULFATE (5 MG/ML) 0.5% IN NEBU
2.5000 mg | INHALATION_SOLUTION | Freq: Four times a day (QID) | RESPIRATORY_TRACT | Status: DC
  Administered 2015-02-03: 2.5 mg via RESPIRATORY_TRACT
  Filled 2015-02-03 (×2): qty 3
  Filled 2015-02-03 (×3): qty 0.5
  Filled 2015-02-03: qty 3
  Filled 2015-02-03 (×5): qty 0.5

## 2015-02-03 NOTE — ED Notes (Signed)
Patient is alert and oriented to self and place.  Able to answer questions at this time.

## 2015-02-03 NOTE — ED Notes (Signed)
Patient transported to CT 

## 2015-02-03 NOTE — Progress Notes (Signed)
A & O. Ambulated to bed from stretcher. High fall risk. Voided in BR. No pain. No further concerns at this time. Swabbed for MRSA.

## 2015-02-03 NOTE — ED Provider Notes (Signed)
[facility] Emergency Department Provider Note  ____________________________________________  Time seen: On arrival, via EMS  I have reviewed the triage vital signs and the nursing notes.   HISTORY  Chief Complaint Seizures    HPI Marcia Brown is a 59 y.o. female who presents after a reported seizure. Apparently the patient had 4 seizures this morning as reported by the patient's remaining. Staff did not witness seizure. Patient has long history of epilepsy and traumatic brain injury in 1992. Patient does report she has had a cough recently but denies fevers chills. She feels well currently and reports compliance with her medications. MAR shows the patient takes Keppra 750 twice a day     Past Medical History  Diagnosis Date  . COPD (chronic obstructive pulmonary disease)   . Seizures   . Sleep apnea   . High cholesterol     There are no active problems to display for this patient.   No past surgical history on file.  Current Outpatient Rx  Name  Route  Sig  Dispense  Refill  . acetaminophen (TYLENOL) 500 MG tablet   Oral   Take 500 mg by mouth every 6 (six) hours as needed.         [name] albuterol (PROVENTIL HFA;VENTOLIN HFA) 108 (90 BASE) MCG/ACT inhaler   Inhalation   Inhale 2 puffs into the lungs every 4 (four) hours as needed for wheezing or shortness of breath.         [name] atorvastatin (LIPITOR) 10 MG tablet   Oral   Take 10 mg by mouth daily.         . cholecalciferol (VITAMIN D) 400 UNITS TABS tablet   Oral   Take 400 Units by mouth.         . clonazePAM (KLONOPIN) 0.5 MG tablet   Oral   Take 0.5 mg by mouth 3 (three) times daily as needed for anxiety.         . cyclobenzaprine (FLEXERIL) 5 MG tablet   Oral   Take 5 mg by mouth daily.         . DULoxetine (CYMBALTA) 60 MG capsule   Oral   Take 60 mg by mouth daily.         . ferrous sulfate 325 (65 FE) MG tablet   Oral   Take 325 mg by mouth 3 (three) times daily  with meals.         . folic acid (FOLVITE) 1 MG tablet   Oral   Take 1 mg by mouth daily.         [name] gabapentin (NEURONTIN) 100 MG capsule   Oral   Take 200 mg by mouth 2 (two) times daily.         [name] levalbuterol (XOPENEX) 1.25 MG/3ML nebulizer solution   Nebulization   Take 1.25 mg by nebulization every 8 (eight) hours as needed for wheezing.         . levETIRAcetam (KEPPRA) 750 MG tablet   Oral   Take 750 mg by mouth 2 (two) times daily.         . mometasone-formoterol (DULERA) 100-5 MCG/ACT AERO   Inhalation   Inhale 2 puffs into the lungs 2 (two) times daily.         . ondansetron (ZOFRAN) 4 MG tablet   Oral   Take 4 mg by mouth every 8 (eight) hours as needed for nausea or vomiting.         [name]  oxyCODONE-acetaminophen (PERCOCET/ROXICET) 5-325 MG per tablet   Oral   Take 1 tablet by mouth 2 (two) times daily as needed for severe pain.         . pantoprazole (PROTONIX) 40 MG tablet   Oral   Take 40 mg by mouth daily.         . phenytoin (DILANTIN) 100 MG ER capsule   Oral   Take by mouth 2 (two) times daily.         Vladimir Faster Glycol-Propyl Glycol (SYSTANE) 0.4-0.3 % SOLN   Ophthalmic   Apply 2 drops to eye 2 (two) times daily.           Allergies Review of patient's allergies indicates no known allergies.  No family history on file.  Social History Social History  Substance Use Topics  . Smoking status: Current Every Day Smoker -- 0.50 packs/day    Types: Cigarettes  . Smokeless tobacco: None  . Alcohol Use: No    Review of Systems  Constitutional: Negative for fever. Eyes: Negative for visual changes. ENT: Negative for sore throat, no tongue injury Cardiovascular: Negative for chest pain. Respiratory: Negative for shortness of breath. Positive for cough Gastrointestinal: Negative for abdominal pain, vomiting and diarrhea. Genitourinary: Negative for dysuria. Musculoskeletal: Negative for back pain. Skin: Negative for  rash. Neurological: Negative for headaches or focal weakness     ____________________________________________   PHYSICAL EXAM:  VITAL SIGNS: ED Triage Vitals  Enc Vitals Group     BP 02/03/15 0844 121/85 mmHg     Pulse Rate 02/03/15 0844 74     Resp 02/03/15 0844 20     Temp 02/03/15 0844 99.3 F (37.4 C)     Temp src --      SpO2 02/03/15 0844 94 %     Weight 02/03/15 0844 210 lb 8.6 oz (95.499 kg)     Height 02/03/15 0844 5' 3.5" (1.613 m)     Head Cir --      Peak Flow --      Pain Score 02/03/15 0847 0     Pain Loc --      Pain Edu? --      Excl. in [place]? --      Constitutional: Alert and oriented. Well appearing and in no distress. Eyes: Conjunctivae are normal.  ENT   Head: Normocephalic and atraumatic.   Mouth/Throat: Mucous membranes are moist. No tongue injury noted Cardiovascular: Normal rate, regular rhythm. Normal and symmetric distal pulses are present in all extremities. No murmurs, rubs, or gallops. Respiratory: Normal respiratory effort without tachypnea nor retractions. Breath sounds are clear and equal bilaterally.  Gastrointestinal: Soft and non-tender in all quadrants. No distention. There is no CVA tenderness. Genitourinary: deferred Musculoskeletal: Nontender with normal range of motion in all extremities. No lower extremity tenderness nor edema. Neurologic:  Normal speech and language. No gross focal neurologic deficits are appreciated. Skin:  Skin is warm, dry and intact. No rash noted. Psychiatric: Mood and affect are normal. Patient exhibits appropriate insight and judgment.  ____________________________________________    LABS (pertinent positives/negatives)  Labs Reviewed  CBC - Abnormal; Notable for the following:    MCV 101.8 (*)    MCH 35.1 (*)    All other components within normal limits  COMPREHENSIVE METABOLIC PANEL     ____________________________________________   EKG  None  ____________________________________________    RADIOLOGY I have personally reviewed any xrays that were ordered on this patient: Chest x-ray shows no acute disease  ____________________________________________   PROCEDURES  Procedure(s) performed: none  Critical Care performed: none  ____________________________________________   INITIAL IMPRESSION / ASSESSMENT AND PLAN / ED COURSE  Pertinent labs & imaging results that were available during my care of the patient were reviewed by me and considered in my medical decision making (see chart for details).  Patient well-appearing the emergency department. We will check labs, chest x-ray given slight elevation in temperature and history of a cough. We will discuss with neurology in regards to adjusting antiepileptics  ____________________________________________ ----------------------------------------- 9:40 AM on 02/03/2015 -----------------------------------------  Patient with witnessed seizure in the emergency department. 1 mg of Ativan ordered. By the time he was given patient had already started to recover   Patient climbed out of her bed and pulled out her IV and apparently had a partial seizure while sitting in a chair next to her bed. Sitter ordered further Ativan given. Discussed with Dr. Tamala Julian of neurology who recommended Vimpat 200 mg IV and 100 mg IV every 12 hours.  I will admit the patient for further eval  FINAL CLINICAL IMPRESSION(S) / ED DIAGNOSES  Final diagnoses:  Seizures     [name], MD 02/03/15 1301

## 2015-02-03 NOTE — ED Notes (Signed)
Patient presents to the ED via EMS from [facility].  Per staff at the [facility], the patient's roommate reports that the patient had four seizures this morning.  Patient was yelling and had odd hand movements.  Patient has a history of head trauma and memory impairment at baseline.  Patient has a history of smoking.  Patient is alert and is able to answer questions at this time.  The right side of patient's mouth appears to be drawn.  Patient is able to answer questions and follow commands.  Hand grips are equal.

## 2015-02-03 NOTE — Consult Note (Signed)
Reason for Consult: seizure Referring Physician: Dr. [name] is an 59 y.o. female.  HPI: seen at the request of Dr. Benjie Brown for recurrent seizure;  59 yo RHD presents to [facility] from NH due to multiple seizures.  Pt has had seizures since TBI about 20 years ago.  Pt reports several seizures a month.  She has not seen a neurologist in a long time per her.  The ER physician what appeared to be a focal seizure and another generalized tonic clonic seizure.  Past Medical History  Diagnosis Date  . COPD (chronic obstructive pulmonary disease)   . Seizures   . Sleep apnea   . High cholesterol     History reviewed. No pertinent past surgical history.  History reviewed. No pertinent family history.  Social History:  reports that she has been smoking Cigarettes.  She has been smoking about 0.50 packs per day. She does not have any smokeless tobacco history on file. She reports that she does not drink alcohol. Her drug history is not on file.  Allergies: No Known Allergies  Medications: personally reviewed by me  Results for orders placed or performed during the hospital encounter of 02/03/15 (from the past 48 hour(s))  CBC     Status: Abnormal   Collection Time: 02/03/15  8:58 AM  Result Value Ref Range   WBC 5.9 3.6 - 11.0 K/uL   RBC 4.03 3.80 - 5.20 MIL/uL   Hemoglobin 14.1 12.0 - 16.0 g/dL   HCT 41.0 35.0 - 47.0 %   MCV 101.8 (H) 80.0 - 100.0 fL   MCH 35.1 (H) 26.0 - 34.0 pg   MCHC 34.4 32.0 - 36.0 g/dL   RDW 14.0 11.5 - 14.5 %   Platelets 172 150 - 440 K/uL  Comprehensive metabolic panel     Status: Abnormal   Collection Time: 02/03/15  8:58 AM  Result Value Ref Range   Sodium 140 135 - 145 mmol/L   Potassium 4.1 3.5 - 5.1 mmol/L   Chloride 103 101 - 111 mmol/L   CO2 27 22 - 32 mmol/L   Glucose, Bld 114 (H) 65 - 99 mg/dL   BUN 10 6 - 20 mg/dL   Creatinine, Ser 0.81 0.44 - 1.00 mg/dL   Calcium 9.3 8.9 - 10.3 mg/dL   Total Protein 7.7 6.5 - 8.1 g/dL   Albumin 4.4 3.5 -  5.0 g/dL   AST 39 15 - 41 U/L   ALT 35 14 - 54 U/L   Alkaline Phosphatase 74 38 - 126 U/L   Total Bilirubin 0.2 (L) 0.3 - 1.2 mg/dL   GFR calc non Af Amer >60 >60 mL/min   GFR calc Af Amer >60 >60 mL/min    Comment: (NOTE) The eGFR has been calculated using the CKD EPI equation. This calculation has not been validated in all clinical situations. eGFR's persistently <60 mL/min signify possible Chronic Kidney Disease.    Anion gap 10 5 - 15  MRSA PCR Screening     Status: None   Collection Time: 02/03/15  3:00 PM  Result Value Ref Range   MRSA by PCR NEGATIVE NEGATIVE    Comment:        The GeneXpert MRSA Assay (FDA approved for NASAL specimens only), is one component of a comprehensive MRSA colonization surveillance program. It is not intended to diagnose MRSA infection nor to guide or monitor treatment for MRSA infections.     Ct Head Wo Contrast  02/03/2015  CLINICAL DATA:  Patient with multiple seizures. Prior history of head trauma and memory impairment at baseline.  EXAM: CT HEAD WITHOUT CONTRAST  TECHNIQUE: Contiguous axial images were obtained from the base of the skull through the vertex without intravenous contrast.  COMPARISON:  Brain CT 08/11/2013  FINDINGS: Re- demonstrated encephalomalacia within the right temporal lobe. Otherwise the ventricles and sulci are appropriate for patient's age. No evidence for acute cortically based infarct, intracranial hemorrhage, mass lesion mass-effect. The orbits are unremarkable. Paranasal sinuses are well aerated. Re- demonstrated old right zygomatic arch fracture and remote right temporal craniotomy/ fracture. The mastoid air cells are well aerated. No acute calvarial fracture.  IMPRESSION: No acute intracranial process.  Remote traumatic injury to the skull.   Electronically Signed   By: Lovey Newcomer M.D.   On: 02/03/2015 12:56   Dg Chest Portable 1 View  02/03/2015   CLINICAL DATA:  Altered mental status and seizure today.  EXAM:  PORTABLE CHEST - 1 VIEW  COMPARISON:  06/13/2014 and prior chest radiographs dating back to 10/07/2009  FINDINGS: This is a mildly low volume film.  The cardiomediastinal silhouette is unchanged with hiatal hernia again noted.  There is no evidence of focal airspace disease, pulmonary edema, suspicious pulmonary nodule/mass, pleural effusion, or pneumothorax. No acute bony abnormalities are identified.  IMPRESSION: No active disease.  Hiatal hernia.   Electronically Signed   By: Margarette Canada M.D.   On: 02/03/2015 10:47    Review of Systems  Constitutional: Negative.   HENT: Negative.   Eyes: Negative.   Respiratory: Negative.   Cardiovascular: Negative.   Gastrointestinal: Negative.   Genitourinary: Negative.   Musculoskeletal: Negative.   Skin: Negative.   Neurological: Positive for focal weakness, seizures and loss of consciousness. Negative for dizziness, tingling, tremors, sensory change and speech change.   Blood pressure 103/66, pulse 72, temperature 99.7 F (37.6 C), temperature source Oral, resp. rate 19, height '5\' 3"'  (1.6 m), weight 90.447 kg (199 lb 6.4 oz), SpO2 94 %. Physical Exam  Nursing note and vitals reviewed. Constitutional: She appears well-developed and well-nourished. No distress.  HENT:  Head: Normocephalic and atraumatic.  Right Ear: External ear normal.  Left Ear: External ear normal.  Nose: Nose normal.  Mouth/Throat: Oropharynx is clear and moist.  Eyes: Conjunctivae and EOM are normal. Pupils are equal, round, and reactive to light. No scleral icterus.  Neck: Normal range of motion. Neck supple.  Cardiovascular: Normal rate, regular rhythm, normal heart sounds and intact distal pulses.   No murmur heard. Respiratory: Effort normal and breath sounds normal. No respiratory distress.  GI: Soft. Bowel sounds are normal. She exhibits no distension.  Musculoskeletal: Normal range of motion.  Neurological:  Alert and oriented to person only, follows, mild  dysarthria but no aphasia PERRLA, EOMI, nl VF, mild R droop, tongue midline 5-/5 B, nl tone 2+/4 B, R babinksi, L down plantar Nl light touch and temp, no neglect  Skin: She is not diaphoretic.   CT of head personally reviewed by me and shows R temporal lobe encephalomalacia  Assessment/Plan: 1.  Status epilepticus-  Pt has had multiple seizures and has not quite returned to baseline yet today.  Pt is at risk for further brain injury and death if these continue to occur.  Pt has hx of what appears to be poorly controlled seizures anyway. 2.  TBI-  Not sure of cognitive baseline -  Increase Keppra to 1gm BID -  Load Vimpat 254m IV now  then 140m BID PO -  EEG if pt continues to have seizures -  No driving or operating heavy machinery x 6 months -  Will follow briefly  Marcia Brown 02/03/2015, 10:29 PM

## 2015-02-03 NOTE — ED Notes (Signed)
MD at bedside to assess patient.  Seizure activity of moaning and moving hands stopped.  Patient staring straight ahead, not answering questions at this time.

## 2015-02-03 NOTE — H&P (Signed)
[facility] NAME: Marcia Brown    MR#:  [id number]  DATE OF BIRTH:  June 27, 1955  DATE OF ADMISSION:  02/03/2015  PRIMARY CARE PHYSICIAN: No PCP Per Patient   REQUESTING/REFERRING PHYSICIAN: Dr Corky Downs  CHIEF COMPLAINT:  Seizures HISTORY OF PRESENT ILLNESS:  Marcia Brown  is a 59 y.o. female with a known history of seizure disorder and TBI who presents with above complaint. It was reported the patient had 4 seizures this morning at her nursing home. While in the emergency room she had 2 witnessed seizures as well. She was noted be post ictal after the seizures. The first one sounded more tonic-clonic. The second one sounded like a partial seizure. Patient is on Keppra at the nursing home. Nursing reports the patient seems more confused during my evaluation then she did when she first arrived here.  PAST MEDICAL HISTORY:   Past Medical History  Diagnosis Date  . COPD (chronic obstructive pulmonary disease)   . Seizures   . Sleep apnea   . High cholesterol     PAST SURGICAL HISTORY:  None  SOCIAL HISTORY:   Social History  Substance Use Topics  . Smoking status: Current Every Day Smoker -- 0.50 packs/day    Types: Cigarettes  . Smokeless tobacco: Not on file  . Alcohol Use: No    FAMILY HISTORY:  No history seizures  DRUG ALLERGIES:  No Known Allergies   REVIEW OF SYSTEMS:  CONSTITUTIONAL: No fever, fatigue or weakness.  EYES: No blurred or double vision.  EARS, NOSE, AND THROAT: No tinnitus or ear pain.  RESPIRATORY: No cough, shortness of breath, wheezing or hemoptysis.  CARDIOVASCULAR: No chest pain, orthopnea, edema.  GASTROINTESTINAL: No nausea, vomiting, diarrhea or abdominal pain.  GENITOURINARY: No dysuria, hematuria.  ENDOCRINE: No polyuria, nocturia,  HEMATOLOGY: No anemia, easy bruising or bleeding SKIN: No rash or lesion. MUSCULOSKELETAL: No joint pain or arthritis.   NEUROLOGIC: No tingling,  numbness, weakness.  PSYCHIATRY: No anxiety or depression.   MEDICATIONS AT HOME:   Prior to Admission medications   Medication Sig Start Date End Date Taking? Authorizing Provider  acetaminophen (TYLENOL) 500 MG tablet Take 500 mg by mouth every 6 (six) hours as needed.   Yes Historical Provider, MD  albuterol (PROVENTIL HFA;VENTOLIN HFA) 108 (90 BASE) MCG/ACT inhaler Inhale 2 puffs into the lungs every 4 (four) hours as needed for wheezing or shortness of breath.   Yes Historical Provider, MD  atorvastatin (LIPITOR) 10 MG tablet Take 10 mg by mouth daily.   Yes Historical Provider, MD  cholecalciferol (VITAMIN D) 400 UNITS TABS tablet Take 400 Units by mouth.   Yes Historical Provider, MD  clonazePAM (KLONOPIN) 0.5 MG tablet Take 0.5 mg by mouth 3 (three) times daily as needed for anxiety.   Yes Historical Provider, MD  cyclobenzaprine (FLEXERIL) 5 MG tablet Take 5 mg by mouth daily.   Yes Historical Provider, MD  DULoxetine (CYMBALTA) 60 MG capsule Take 60 mg by mouth daily.   Yes Historical Provider, MD  ferrous sulfate 325 (65 FE) MG tablet Take 325 mg by mouth 3 (three) times daily with meals.   Yes Historical Provider, MD  folic acid (FOLVITE) 1 MG tablet Take 1 mg by mouth daily.   Yes Historical Provider, MD  gabapentin (NEURONTIN) 100 MG capsule Take 200 mg by mouth 2 (two) times daily.   Yes Historical Provider, MD  levalbuterol Penne Lash) 1.25 MG/3ML nebulizer solution Take 1.25  mg by nebulization every 8 (eight) hours as needed for wheezing.   Yes Historical Provider, MD  levETIRAcetam (KEPPRA) 750 MG tablet Take 750 mg by mouth 2 (two) times daily.   Yes Historical Provider, MD  mometasone-formoterol (DULERA) 100-5 MCG/ACT AERO Inhale 2 puffs into the lungs 2 (two) times daily.   Yes Historical Provider, MD  ondansetron (ZOFRAN) 4 MG tablet Take 4 mg by mouth every 8 (eight) hours as needed for nausea or vomiting.   Yes Historical Provider, MD  oxyCODONE-acetaminophen  (PERCOCET/ROXICET) 5-325 MG per tablet Take 1 tablet by mouth 2 (two) times daily as needed for severe pain.   Yes Historical Provider, MD  pantoprazole (PROTONIX) 40 MG tablet Take 40 mg by mouth daily.   Yes Historical Provider, MD  phenytoin (DILANTIN) 100 MG ER capsule Take by mouth 2 (two) times daily.   Yes Historical Provider, MD  Polyethyl Glycol-Propyl Glycol (SYSTANE) 0.4-0.3 % SOLN Apply 2 drops to eye 2 (two) times daily.   Yes Historical Provider, MD      VITAL SIGNS:  Blood pressure 115/62, pulse 72, temperature 99.3 F (37.4 C), resp. rate 18, height 5' 3.5" (1.613 m), weight 95.499 kg (210 lb 8.6 oz), SpO2 95 %.  PHYSICAL EXAMINATION:  GENERAL:  59 y.o.-year-old patient lying in the bed with no acute distress.  EYES: Pupils equal, round, reactive to light and accommodation. No scleral icterus. Extraocular muscles intact.  HEENT: Head atraumatic, normocephalic. Oropharynx and nasopharynx clear.  NECK:  Supple, no jugular venous distention. No thyroid enlargement, no tenderness.  LUNGS: Normal breath sounds bilaterally, no wheezing, rales,rhonchi or crepitation. No use of accessory muscles of respiration.  CARDIOVASCULAR: S1, S2 normal. No murmurs, rubs, or gallops.  ABDOMEN: Soft, nontender, nondistended. Bowel sounds present. No organomegaly or mass.  EXTREMITIES: No pedal edema, cyanosis, or clubbing.  NEUROLOGIC: Cranial nerves II through XII are grossly intact. No focal deficits. PSYCHIATRIC: The patient is alert and oriented x 3. She doesn't seem grossly confused she answers all questions appropriately SKIN: No obvious rash, lesion, or ulcer.   LABORATORY PANEL:   CBC  Recent Labs Lab 02/03/15 0858  WBC 5.9  HGB 14.1  HCT 41.0  PLT 172   ------------------------------------------------------------------------------------------------------------------  Chemistries   Recent Labs Lab 02/03/15 0858  NA 140  K 4.1  CL 103  CO2 27  GLUCOSE 114*  BUN 10   CREATININE 0.81  CALCIUM 9.3  AST 39  ALT 35  ALKPHOS 74  BILITOT 0.2*   ------------------------------------------------------------------------------------------------------------------  Cardiac Enzymes No results for input(s): TROPONINI in the last 168 hours. ------------------------------------------------------------------------------------------------------------------  RADIOLOGY:  Ct Head Wo Contrast  02/03/2015   C  IMPRESSION: No acute intracranial process.  Remote traumatic injury to the skull.   Electronically Signed   By: Lovey Newcomer M.D.   On: 02/03/2015 12:56   Dg Chest Portable 1 View  02/03/2015     IMPRESSION: No active disease.  Hiatal hernia.   Electronically Signed   By: Margarette Canada M.D.   On: 02/03/2015 10:47    EKG:   IMPRESSION AND PLAN:  59 year old female with history of TBI and seizure disorder who presents with multiple seizures over the past 12 hours.  1. Seizure: Patient will be admitted to the hospital service. Patient will be on seizure precautions. EEG is ordered for the a.m. Patient will continue on her outpatient medications including Keppra. I have also started Vimpat. Neurology consult has been obtained as well. She was loaded with Vimpat  in the emergency room.  2. COPD: Patient does not appear to be in exacerbation. She will continue inhalers.  3. TBI: Continue gabapentin, Flexeril and Klonopin.  4. Hyperlipidemia: Continue atorvastatin.  5. History of sleep apnea: CPAP has been ordered.    All the records are reviewed and case discussed with ED provider. Management plans discussed with the patient and she is in agreement.  CODE STATUS: FULL  TOTAL TIME TAKING CARE OF THIS PATIENT: 45  minutes.    Berdella Bacot M.D on 02/03/2015 at 1:49 PM  Between 7am to 6pm - Pager - [phone] After 6pm go to [email or web address] - password [email or web address] [facility]  [facility]  Office  [phone]  CC: Primary care physician; No PCP Per  Patient

## 2015-02-03 NOTE — Progress Notes (Signed)
Skin checked byTanya B RN  

## 2015-02-03 NOTE — ED Notes (Signed)
Patient returned from CT scan at this time.  Placed back on pulse ox.

## 2015-02-03 NOTE — Progress Notes (Signed)
Pt refused nicotine patch.

## 2015-02-04 ENCOUNTER — Other Ambulatory Visit: Payer: Self-pay

## 2015-02-04 ENCOUNTER — Inpatient Hospital Stay: Payer: Medicaid Other

## 2015-02-04 ENCOUNTER — Emergency Department: Payer: Medicaid Other

## 2015-02-04 ENCOUNTER — Encounter: Payer: Self-pay | Admitting: Emergency Medicine

## 2015-02-04 ENCOUNTER — Emergency Department
Admission: EM | Admit: 2015-02-04 | Discharge: 2015-02-04 | Disposition: A | Discharge status: Home / Self Care | Payer: Medicaid Other | Attending: Emergency Medicine | Admitting: Emergency Medicine

## 2015-02-04 DIAGNOSIS — Z720 Tobacco use: Secondary | ICD-10-CM | POA: Insufficient documentation

## 2015-02-04 DIAGNOSIS — J441 Chronic obstructive pulmonary disease with (acute) exacerbation: Secondary | ICD-10-CM | POA: Diagnosis not present

## 2015-02-04 DIAGNOSIS — Z79899 Other long term (current) drug therapy: Secondary | ICD-10-CM | POA: Insufficient documentation

## 2015-02-04 LAB — BASIC METABOLIC PANEL
Anion gap: 9 (ref 5–15)
BUN: 12 mg/dL (ref 6–20)
CHLORIDE: 104 mmol/L (ref 101–111)
CO2: 28 mmol/L (ref 22–32)
Calcium: 9 mg/dL (ref 8.9–10.3)
Creatinine, Ser: 0.74 mg/dL (ref 0.44–1.00)
GFR calc Af Amer: >60 mL/min (ref >60)
GLUCOSE: 100 mg/dL — AB (ref 65–99)
POTASSIUM: 3.7 mmol/L (ref 3.5–5.1)
Sodium: 141 mmol/L (ref 135–145)

## 2015-02-04 MED ORDER — LEVETIRACETAM 1000 MG PO TABS: 1000.0000 mg | ORAL_TABLET | Freq: Two times a day (BID) | ORAL | Status: DC

## 2015-02-04 MED ORDER — GI COCKTAIL ~~LOC~~
30.0000 mL | ORAL | Status: AC
  Administered 2015-02-04: 30 mL via ORAL
  Filled 2015-02-04: qty 30

## 2015-02-04 MED ORDER — AZITHROMYCIN 250 MG PO TABS: ORAL_TABLET | ORAL | Status: DC

## 2015-02-04 MED ORDER — IPRATROPIUM-ALBUTEROL 0.5-2.5 (3) MG/3ML IN SOLN
3.0000 mL | Freq: Once | RESPIRATORY_TRACT | Status: AC
  Administered 2015-02-04: 3 mL via RESPIRATORY_TRACT
  Filled 2015-02-04: qty 3

## 2015-02-04 MED ORDER — LACOSAMIDE 100 MG PO TABS: 100.0000 mg | ORAL_TABLET | Freq: Two times a day (BID) | ORAL | Status: DC

## 2015-02-04 NOTE — Discharge Instructions (Signed)
°  DIET:  Cardiac diet  DISCHARGE CONDITION:  Stable  ACTIVITY:  Activity as tolerated  OXYGEN:  Home Oxygen: No.   Oxygen Delivery: room air  DISCHARGE LOCATION:  Return to [place]   If you experience worsening of your admission symptoms, develop shortness of breath, life threatening emergency, suicidal or homicidal thoughts you must seek medical attention immediately by calling 911 or calling your MD immediately  if symptoms less severe.  You Must read complete instructions/literature along with all the possible adverse reactions/side effects for all the Medicines you take and that have been prescribed to you. Take any new Medicines after you have completely understood and accpet all the possible adverse reactions/side effects.   Please note  You were cared for by a hospitalist during your hospital stay. If you have any questions about your discharge medications or the care you received while you were in the hospital after you are discharged, you can call the unit and asked to speak with the hospitalist on call if the hospitalist that took care of you is not available. Once you are discharged, your primary care physician will handle any further medical issues. Please note that NO REFILLS for any discharge medications will be authorized once you are discharged, as it is imperative that you return to your primary care physician (or establish a relationship with a primary care physician if you do not have one) for your aftercare needs so that they can reassess your need for medications and monitor your lab values.  No driving or operate any heavy machinery for 6 months

## 2015-02-04 NOTE — Clinical Social Work Note (Signed)
Clinical Social Work Assessment  Patient Details  Name: Marcia Brown MRN: [id number] Date of Birth: Apr 03, 1956  Date of referral:  02/04/15               Reason for consult:  Facility Placement (pt is from [facility])                Permission sought to share information with:  Facility Art therapist granted to share information::  Yes, Verbal Permission Granted  Name::        Agency::     Relationship::     Contact Information:     Housing/Transportation Living arrangements for the past 2 months:  [place] of Information:  Patient, Facility Patient Interpreter Needed:  None Criminal Activity/Legal Involvement Pertinent to Current Situation/Hospitalization:  No - Comment as needed Significant Relationships:  None Lives with:  Facility Resident Do you feel safe going back to the place where you live?  Yes Need for family participation in patient care:  No (Coment)  Care giving concerns:  No concerns expressed at this time.   Social Worker assessment / plan:  CSW spoke to pt.  SHe stated that she was in agreement with DC back to [facility].    Employment status:  Disabled (Comment on whether or not currently receiving Disability) Insurance information:  [place]icaid [place] PT Recommendations:    Information / Referral to community resources:     Patient/Family's Response to care:  Pt in agreement with DC to the [facility]   Patient/Family's Understanding of and Emotional Response to Diagnosis, Current Treatment, and Prognosis:  Pt in agreement with DC to the [facility] and verbalized this understanding.   Emotional Assessment Appearance:  Appears older than stated age Attitude/Demeanor/Rapport:   (appropriate) Affect (typically observed):  Appropriate Orientation:  Oriented to Self, Oriented to Place, Oriented to Situation, Oriented to  Time Alcohol / Substance use:    Psych involvement (Current and /or in the community):  No  (Comment)  Discharge Needs  Concerns to be addressed:  Care Coordination Readmission within the last 30 days:  No Current discharge risk:  None Barriers to Discharge:  No Barriers Identified   Mathews Argyle, LCSW 02/04/2015, 1:23 PM

## 2015-02-04 NOTE — Clinical Social Work Note (Signed)
CSW notified facility pt and RN that pt would DC today back to [facility].  CSW signing off unless further needs arise.

## 2015-02-04 NOTE — Progress Notes (Signed)
Report called to Christa at [facility].

## 2015-02-04 NOTE — Discharge Instructions (Signed)
Chronic Obstructive Pulmonary Disease Exacerbation ° Chronic obstructive pulmonary disease (COPD) is a common lung problem. In COPD, the flow of air from the lungs is limited. COPD exacerbations are times that breathing gets worse and you need extra treatment. Without treatment they can be life threatening. If they happen often, your lungs can become more damaged. °HOME CARE °· Do not smoke. °· Avoid tobacco smoke and other things that bother your lungs. °· If given, take your antibiotic medicine as told. Finish the medicine even if you start to feel better. °· Only take medicines as told by your doctor. °· Drink enough fluids to keep your pee (urine) clear or pale yellow (unless your doctor has told you not to). °· Use a cool mist machine (vaporizer). °· If you use oxygen or a machine that turns liquid medicine into a mist (nebulizer), continue to use them as told. °· Keep up with shots (vaccinations) as told by your doctor. °· Exercise regularly. °· Eat healthy foods. °· Keep all doctor visits as told. °GET HELP RIGHT AWAY IF: °· You are very short of breath and it gets worse. °· You have trouble talking. °· You have bad chest pain. °· You have blood in your spit (sputum). °· You have a fever. °· You keep throwing up (vomiting). °· You feel weak, or you pass out (faint). °· You feel confused. °· You keep getting worse. °MAKE SURE YOU:  °· Understand these instructions. °· Will watch your condition. °· Will get help right away if you are not doing well or get worse. °Document Released: 05/17/2011 Document Revised: 03/18/2013 Document Reviewed: 01/30/2013 °ExitCare® Patient Information ©2015 [employer]. This information is not intended to replace advice given to you by your health care provider. Make sure you discuss any questions you have with your health care provider. ° °

## 2015-02-04 NOTE — ED Provider Notes (Signed)
[facility] Emergency Department Provider Note  ____________________________________________  Time seen: 8:10 PM  I have reviewed the triage vital signs and the nursing notes.   HISTORY  Chief Complaint Dizziness    HPI Marcia Brown is a 59 y.o. female who complains of feeling dizzy and some mild shortness of breath today she also reports a nonproductive cough since yesterday. She was seen in the ED yesterday for seizures. She has a multiple antiepileptics and compliant with medication per nursing home medication reconciliation. Denies any seizure falls or injuries today. No fevers or chills chest pain abdominal pain vomiting or diarrhea.     Past Medical History  Diagnosis Date  . COPD (chronic obstructive pulmonary disease)   . Seizures   . Sleep apnea   . High cholesterol     Patient Active Problem List   Diagnosis Date Noted  . Seizure 02/03/2015    History reviewed. No pertinent past surgical history.  Current Outpatient Rx  Name  Route  Sig  Dispense  Refill  . acetaminophen (TYLENOL) 500 MG tablet   Oral   Take 500 mg by mouth every 6 (six) hours as needed.         [name] albuterol (PROVENTIL HFA;VENTOLIN HFA) 108 (90 BASE) MCG/ACT inhaler   Inhalation   Inhale 2 puffs into the lungs every 4 (four) hours as needed for wheezing or shortness of breath.         [name] atorvastatin (LIPITOR) 10 MG tablet   Oral   Take 10 mg by mouth daily.         [name] azithromycin (ZITHROMAX Z-PAK) 250 MG tablet      Take 2 tablets (500 mg) on  Day 1,  followed by 1 tablet (250 mg) once daily on Days 2 through 5.   6 each   0   . cholecalciferol (VITAMIN D) 400 UNITS TABS tablet   Oral   Take 400 Units by mouth.         . clonazePAM (KLONOPIN) 0.5 MG tablet   Oral   Take 0.5 mg by mouth 3 (three) times daily as needed for anxiety.         . cyclobenzaprine (FLEXERIL) 5 MG tablet   Oral   Take 5 mg by mouth daily.         . DULoxetine  (CYMBALTA) 60 MG capsule   Oral   Take 60 mg by mouth daily.         . ferrous sulfate 325 (65 FE) MG tablet   Oral   Take 325 mg by mouth 3 (three) times daily with meals.         . folic acid (FOLVITE) 1 MG tablet   Oral   Take 1 mg by mouth daily.         [name] gabapentin (NEURONTIN) 100 MG capsule   Oral   Take 200 mg by mouth 2 (two) times daily.         [name] lacosamide 100 MG TABS   Oral   Take 1 tablet (100 mg total) by mouth 2 (two) times daily.   60 tablet   0   . levalbuterol (XOPENEX) 1.25 MG/3ML nebulizer solution   Nebulization   Take 1.25 mg by nebulization every 8 (eight) hours as needed for wheezing.         . levETIRAcetam (KEPPRA) 1000 MG tablet   Oral   Take 1 tablet (1,000 mg total) by mouth 2 (two)  times daily.   60 tablet   0   . mometasone-formoterol (DULERA) 100-5 MCG/ACT AERO   Inhalation   Inhale 2 puffs into the lungs 2 (two) times daily.         . ondansetron (ZOFRAN) 4 MG tablet   Oral   Take 4 mg by mouth every 8 (eight) hours as needed for nausea or vomiting.         [name] oxyCODONE-acetaminophen (PERCOCET/ROXICET) 5-325 MG per tablet   Oral   Take 1 tablet by mouth 2 (two) times daily as needed for severe pain.         . pantoprazole (PROTONIX) 40 MG tablet   Oral   Take 40 mg by mouth daily.         . phenytoin (DILANTIN) 100 MG ER capsule   Oral   Take by mouth 2 (two) times daily.         Vladimir Faster Glycol-Propyl Glycol (SYSTANE) 0.4-0.3 % SOLN   Ophthalmic   Apply 2 drops to eye 2 (two) times daily.           Allergies Review of patient's allergies indicates no known allergies.  History reviewed. No pertinent family history.  Social History Social History  Substance Use Topics  . Smoking status: Current Every Day Smoker -- 0.50 packs/day    Types: Cigarettes  . Smokeless tobacco: None  . Alcohol Use: No    Review of Systems  Constitutional: No fever or chills. No weight changes Eyes:No blurry  vision or double vision.  ENT: No sore throat. Cardiovascular: No chest pain. Respiratory: Shortness of breath with nonproductive cough.  Gastrointestinal: Negative for abdominal pain, vomiting and diarrhea.  No BRBPR or melena. Genitourinary: Negative for dysuria, urinary retention, bloody urine, or difficulty urinating. Musculoskeletal: Negative for back pain. No joint swelling or pain. Skin: Negative for rash. Neurological: Negative for headaches, focal weakness or numbness. Psychiatric:No anxiety or depression.   Endocrine:No hot/cold intolerance, changes in energy, or sleep difficulty.  10-point ROS otherwise negative.  ____________________________________________   PHYSICAL EXAM:  VITAL SIGNS: ED Triage Vitals  Enc Vitals Group     BP 02/04/15 1958 132/65 mmHg     Pulse Rate 02/04/15 1958 72     Resp 02/04/15 1958 18     Temp 02/04/15 1958 98.1 F (36.7 C)     Temp Source 02/04/15 1958 Oral     SpO2 02/04/15 1958 95 %     Weight --      Height --      Head Cir --      Peak Flow --      Pain Score 02/04/15 2040 2     Pain Loc --      Pain Edu? --      Excl. in [place]? --      Constitutional: Alert and oriented. Well appearing and in no distress. Eyes: No scleral icterus. No conjunctival pallor. PERRL. EOMI ENT   Head: Normocephalic and atraumatic.   Nose: No congestion/rhinnorhea. No septal hematoma   Mouth/Throat: MMM, no pharyngeal erythema. No peritonsillar mass. No uvula shift.   Neck: No stridor. No SubQ emphysema. No meningismus. Hematological/Lymphatic/Immunilogical: No cervical lymphadenopathy. Cardiovascular: RRR. Normal and symmetric distal pulses are present in all extremities. No murmurs, rubs, or gallops. Respiratory: Normal respiratory effort without tachypnea nor retractions. Breath sounds are clear and equal bilaterally. Mildly prolonged expiratory phase, mild expiratory wheezing-induced with forceful expiration.. Gastrointestinal: Soft  and nontender. No distention. There is no  CVA tenderness.  No rebound, rigidity, or guarding. Genitourinary: deferred Musculoskeletal: Nontender with normal range of motion in all extremities. No joint effusions.  No lower extremity tenderness.  No edema. Neurologic:   Normal speech and language.  CN 2-10 normal. Motor grossly intact. No gross focal neurologic deficits are appreciated.  Skin:  Skin is warm, dry and intact. No rash noted.  No petechiae, purpura, or bullae. Psychiatric: Mood and affect are normal. Speech and behavior are normal. Patient exhibits appropriate insight and judgment.  ____________________________________________    LABS (pertinent positives/negatives) (all labs ordered are listed, but only abnormal results are displayed) Labs Reviewed - No data to display ____________________________________________   EKG  Interpreted by me Normal sinus rhythm rate of 75, normal axis intervals QRS and ST segments and T waves.  ____________________________________________    RADIOLOGY  Chest x-ray unremarkable  ____________________________________________   PROCEDURES  ____________________________________________   INITIAL IMPRESSION / ASSESSMENT AND PLAN / ED COURSE  Pertinent labs & imaging results that were available during my care of the patient were reviewed by me and considered in my medical decision making (see chart for details).  Patient presents with COPD exacerbation versus mild bronchitis. Vital signs are normal. Low suspicion for PE ACS TAD pneumothorax or carditis mediastinitis. We'll start her on a Z-Pak. She was given a DuoNeb in the ED and feels better. We'll discharge home.  ____________________________________________   FINAL CLINICAL IMPRESSION(S) / ED DIAGNOSES  Final diagnoses:  COPD with acute exacerbation      Carrie Mew, MD 02/04/15 2112
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
## 2015-02-04 NOTE — Progress Notes (Signed)
To EEG via bed.

## 2015-02-04 NOTE — Care Management (Signed)
Patient admitted under inpatient for seizure.  Discharge order is present.  It appears patient is from [facility]. Notified CSW

## 2015-02-04 NOTE — Progress Notes (Signed)
Pt stated she did not wear CPAP and does not like it but may try it tonight.

## 2015-02-04 NOTE — Progress Notes (Signed)
Back from EEG

## 2015-02-04 NOTE — ED Notes (Addendum)
Pt presents to ED via EMS with c/o feeling dizzy and feeling she was about to have seizure. Alerts and oriented x4 at this time. Airway intact.

## 2015-02-04 NOTE — Progress Notes (Signed)
NEUROLOGY NOTE  S: Pt feels good and wants to go home  ROS: Neg x 8 systems except for depression  O: 98.8 103/66 70 18 Nl weight, NAD Normocephalic, oropharynx clear Supple, no JVD CTA B, no wheezing RRR, no murmurs  Alert but oriented to person and place not all of time, nl speech and language PERRLA, EOMI, face symmetric 5/5 B, nl tone, no tremor   A/P: 1. Epilepsy-  Now appears controlled -  Continue Keppra 1gm BID PO -  Continue Vimpat 100mg  BID PO -  No driving or operating heavy machinery x 6 months -  Will sign off, please call with questions -  Needs to f/u with [facility] in 3 months

## 2015-02-04 NOTE — Progress Notes (Signed)
Pt discharged to [facility] with caregiver via wc.  Packet given to caregiver.  Questions answered.  No distress.

## 2015-02-05 LAB — LEVETIRACETAM LEVEL: LEVETIRACETAM: 2.8 ug/mL — AB (ref 10.0–40.0)

## 2015-02-07 ENCOUNTER — Ambulatory Visit
Admission: RE | Admit: 2015-02-07 | Discharge: 2015-02-07 | Disposition: A | Discharge status: Home / Self Care | Payer: Medicaid Other | Source: Ambulatory Visit | Attending: Podiatry | Admitting: Podiatry

## 2015-02-07 DIAGNOSIS — Z1231 Encounter for screening mammogram for malignant neoplasm of breast: Secondary | ICD-10-CM | POA: Insufficient documentation

## 2015-02-08 ENCOUNTER — Ambulatory Visit: Payer: Medicaid Other | Attending: Internal Medicine

## 2015-02-08 DIAGNOSIS — G9349 Other encephalopathy: Secondary | ICD-10-CM | POA: Diagnosis present

## 2015-05-12 ENCOUNTER — Emergency Department
Admission: EM | Admit: 2015-05-12 | Discharge: 2015-05-12 | Disposition: A | Discharge status: Home / Self Care | Payer: Medicaid Other | Attending: Emergency Medicine | Admitting: Emergency Medicine

## 2015-05-12 ENCOUNTER — Emergency Department: Payer: Medicaid Other

## 2015-05-12 ENCOUNTER — Encounter: Payer: Self-pay | Admitting: *Deleted

## 2015-05-12 DIAGNOSIS — F17210 Nicotine dependence, cigarettes, uncomplicated: Secondary | ICD-10-CM | POA: Insufficient documentation

## 2015-05-12 DIAGNOSIS — Z792 Long term (current) use of antibiotics: Secondary | ICD-10-CM | POA: Diagnosis not present

## 2015-05-12 DIAGNOSIS — Z79899 Other long term (current) drug therapy: Secondary | ICD-10-CM | POA: Insufficient documentation

## 2015-05-12 DIAGNOSIS — R29810 Facial weakness: Secondary | ICD-10-CM | POA: Diagnosis not present

## 2015-05-12 DIAGNOSIS — Z7951 Long term (current) use of inhaled steroids: Secondary | ICD-10-CM | POA: Insufficient documentation

## 2015-05-12 DIAGNOSIS — R531 Weakness: Secondary | ICD-10-CM | POA: Diagnosis present

## 2015-05-12 DIAGNOSIS — N390 Urinary tract infection, site not specified: Secondary | ICD-10-CM | POA: Insufficient documentation

## 2015-05-12 LAB — COMPREHENSIVE METABOLIC PANEL
ALK PHOS: 65 U/L (ref 38–126)
ALT: 23 U/L (ref 14–54)
ANION GAP: 8 (ref 5–15)
AST: 23 U/L (ref 15–41)
Albumin: 3.9 g/dL (ref 3.5–5.0)
BILIRUBIN TOTAL: 0.3 mg/dL (ref 0.3–1.2)
BUN: 10 mg/dL (ref 6–20)
CO2: 28 mmol/L (ref 22–32)
Calcium: 9.1 mg/dL (ref 8.9–10.3)
Chloride: 107 mmol/L (ref 101–111)
Creatinine, Ser: 0.69 mg/dL (ref 0.44–1.00)
GLUCOSE: 96 mg/dL (ref 65–99)
Potassium: 4.1 mmol/L (ref 3.5–5.1)
Sodium: 143 mmol/L (ref 135–145)
TOTAL PROTEIN: 6.9 g/dL (ref 6.5–8.1)

## 2015-05-12 LAB — TROPONIN I

## 2015-05-12 LAB — CBC WITH DIFFERENTIAL/PLATELET
BASOS ABS: 0 10*3/uL (ref 0–0.1)
BASOS PCT: 1 %
EOS ABS: 0.2 10*3/uL (ref 0–0.7)
Eosinophils Relative: 3 %
HCT: 38.2 % (ref 35.0–47.0)
HEMOGLOBIN: 12.9 g/dL (ref 12.0–16.0)
Lymphocytes Relative: 33 %
Lymphs Abs: 1.6 10*3/uL (ref 1.0–3.6)
MCH: 34.7 pg — ABNORMAL HIGH (ref 26.0–34.0)
MCHC: 33.8 g/dL (ref 32.0–36.0)
MCV: 102.8 fL — ABNORMAL HIGH (ref 80.0–100.0)
MONOS PCT: 9 %
Monocytes Absolute: 0.4 10*3/uL (ref 0.2–0.9)
NEUTROS PCT: 54 %
Neutro Abs: 2.6 10*3/uL (ref 1.4–6.5)
Platelets: 177 10*3/uL (ref 150–440)
RBC: 3.71 MIL/uL — AB (ref 3.80–5.20)
RDW: 13.5 % (ref 11.5–14.5)
WBC: 4.8 10*3/uL (ref 3.6–11.0)

## 2015-05-12 LAB — URINALYSIS COMPLETE WITH MICROSCOPIC (ARMC ONLY)
Bilirubin Urine: NEGATIVE
Glucose, UA: NEGATIVE mg/dL
KETONES UR: NEGATIVE mg/dL
NITRITE: POSITIVE — AB
PROTEIN: NEGATIVE mg/dL
SPECIFIC GRAVITY, URINE: 1.008 (ref 1.005–1.030)
pH: 7 (ref 5.0–8.0)

## 2015-05-12 LAB — BRAIN NATRIURETIC PEPTIDE: B NATRIURETIC PEPTIDE 5: 62 pg/mL (ref 0.0–100.0)

## 2015-05-12 MED ORDER — CEPHALEXIN 500 MG PO CAPS
1000.0000 mg | ORAL_CAPSULE | Freq: Once | ORAL | Status: AC
  Administered 2015-05-12: 1000 mg via ORAL
  Filled 2015-05-12: qty 2

## 2015-05-12 MED ORDER — SODIUM CHLORIDE 0.9 % IV SOLN
Freq: Once | INTRAVENOUS | Status: AC
  Administered 2015-05-12: 19:00:00 via INTRAVENOUS

## 2015-05-12 MED ORDER — CEPHALEXIN 500 MG PO CAPS: 500.0000 mg | ORAL_CAPSULE | Freq: Four times a day (QID) | ORAL | Status: AC

## 2015-05-12 NOTE — ED Notes (Signed)
Patient transported to CT 
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
## 2015-05-12 NOTE — ED Provider Notes (Signed)
[facility] Emergency Department Provider Note  ____________________________________________  Time seen: Approximately 6:03 PM  I have reviewed the triage vital signs and the nursing notes.   HISTORY  Chief Complaint Weakness    HPI Marcia Brown is a 59 y.o. female patient is reported by nursing home to have fallen 2 days ago and since then has just been kind of listless not eating much not walking around as much as usual. She reports she cut fell up against the wall in the bathroom really didn't hit anything really hasn't heard anything he says nothing is really bothering her she does not have a headache or shortness of breath or chest pain or belly pain she does have some foul odor to her urine she says. Patient says her mother told her face was droopy on one side   Past Medical History  Diagnosis Date  . COPD (chronic obstructive pulmonary disease) ([place])   . Seizures ([place])   . Sleep apnea   . High cholesterol     Patient Active Problem List   Diagnosis Date Noted  . Seizure ([place]) 02/03/2015    No past surgical history on file.  Current Outpatient Rx  Name  Route  Sig  Dispense  Refill  . acetaminophen (TYLENOL) 500 MG tablet   Oral   Take 500 mg by mouth every 6 (six) hours as needed.         [name] albuterol (PROVENTIL HFA;VENTOLIN HFA) 108 (90 BASE) MCG/ACT inhaler   Inhalation   Inhale 2 puffs into the lungs every 4 (four) hours as needed for wheezing or shortness of breath.         [name] atorvastatin (LIPITOR) 10 MG tablet   Oral   Take 10 mg by mouth daily.         [name] azithromycin (ZITHROMAX Z-PAK) 250 MG tablet      Take 2 tablets (500 mg) on  Day 1,  followed by 1 tablet (250 mg) once daily on Days 2 through 5.   6 each   0   . cephALEXin (KEFLEX) 500 MG capsule   Oral   Take 1 capsule (500 mg total) by mouth 4 (four) times daily.   40 capsule   0   . cholecalciferol (VITAMIN D) 400 UNITS TABS tablet   Oral   Take 400 Units by  mouth.         . clonazePAM (KLONOPIN) 0.5 MG tablet   Oral   Take 0.5 mg by mouth 3 (three) times daily as needed for anxiety.         . cyclobenzaprine (FLEXERIL) 5 MG tablet   Oral   Take 5 mg by mouth daily.         . DULoxetine (CYMBALTA) 60 MG capsule   Oral   Take 60 mg by mouth daily.         . ferrous sulfate 325 (65 FE) MG tablet   Oral   Take 325 mg by mouth 3 (three) times daily with meals.         . folic acid (FOLVITE) 1 MG tablet   Oral   Take 1 mg by mouth daily.         [name] gabapentin (NEURONTIN) 100 MG capsule   Oral   Take 200 mg by mouth 2 (two) times daily.         [name] lacosamide 100 MG TABS   Oral   Take 1 tablet (100 mg total)  by mouth 2 (two) times daily.   60 tablet   0   . levalbuterol (XOPENEX) 1.25 MG/3ML nebulizer solution   Nebulization   Take 1.25 mg by nebulization every 8 (eight) hours as needed for wheezing.         . levETIRAcetam (KEPPRA) 1000 MG tablet   Oral   Take 1 tablet (1,000 mg total) by mouth 2 (two) times daily.   60 tablet   0   . mometasone-formoterol (DULERA) 100-5 MCG/ACT AERO   Inhalation   Inhale 2 puffs into the lungs 2 (two) times daily.         . ondansetron (ZOFRAN) 4 MG tablet   Oral   Take 4 mg by mouth every 8 (eight) hours as needed for nausea or vomiting.         [name] oxyCODONE-acetaminophen (PERCOCET/ROXICET) 5-325 MG per tablet   Oral   Take 1 tablet by mouth 2 (two) times daily as needed for severe pain.         . pantoprazole (PROTONIX) 40 MG tablet   Oral   Take 40 mg by mouth daily.         . phenytoin (DILANTIN) 100 MG ER capsule   Oral   Take by mouth 2 (two) times daily.         Vladimir Faster Glycol-Propyl Glycol (SYSTANE) 0.4-0.3 % SOLN   Ophthalmic   Apply 2 drops to eye 2 (two) times daily.           Allergies Aspirin  No family history on file.  Social History Social History  Substance Use Topics  . Smoking status: Current Every Day Smoker -- 0.50  packs/day    Types: Cigarettes  . Smokeless tobacco: None  . Alcohol Use: No    Review of Systems Constitutional: No fever/chills Eyes: No visual changes. ENT: No sore throat. Cardiovascular: Denies chest pain. Respiratory: Denies shortness of breath. Gastrointestinal: No abdominal pain.  No nausea, no vomiting.  No diarrhea.  No constipation. Genitourinary: Negative for dysuria. Musculoskeletal: Negative for back pain. Skin: Negative for rash. Neurological: Negative for headaches, focal weakness or numbness.  10-point ROS otherwise negative.  ____________________________________________   PHYSICAL EXAM:  VITAL SIGNS: ED Triage Vitals  Enc Vitals Group     BP --      Pulse --      Resp --      Temp --      Temp src --      SpO2 --      Weight --      Height --      Head Cir --      Peak Flow --      Pain Score --      Pain Loc --      Pain Edu? --      Excl. in [place]? --     Constitutional: Alert and oriented. Well appearing and in no acute distress. Eyes: Conjunctivae are normal. PERRL. EOMI. Head: Atraumatic patient does have a slight facial droop which I history at least his old. Nose: No congestion/rhinnorhea. Mouth/Throat: Mucous membranes are moist.  Oropharynx non-erythematous. Neck: No stridor.   Cardiovascular: Normal rate, regular rhythm. Grossly normal heart sounds.  Good peripheral circulation. Respiratory: Normal respiratory effort.  No retractions. Lungs CTAB. Gastrointestinal: Soft and nontender. No distention. No abdominal bruits. No CVA tenderness. Musculoskeletal: No lower extremity tenderness nor edema.  No joint effusions. Neurologic:  Normal speech and language. No gross focal  neurologic deficits are appreciated. Cranial nerves II through XII are intact except for the facial droop which is associated appears to be old cerebellar finger to nose and alternating movements in the hands is equal bilaterally. No sensory or motor changes that I can  find No gait instability. Skin:  Skin is warm, dry and intact. No rash noted.   ____________________________________________   LABS (all labs ordered are listed, but only abnormal results are displayed)  Labs Reviewed  CBC WITH DIFFERENTIAL/PLATELET - Abnormal; Notable for the following:    RBC 3.71 (*)    MCV 102.8 (*)    MCH 34.7 (*)    All other components within normal limits  URINALYSIS COMPLETEWITH MICROSCOPIC ([facility] ONLY) - Abnormal; Notable for the following:    Color, Urine YELLOW (*)    APPearance CLEAR (*)    Hgb urine dipstick 1+ (*)    Nitrite POSITIVE (*)    Leukocytes, UA 1+ (*)    Bacteria, UA FEW (*)    Squamous Epithelial / LPF 0-5 (*)    All other components within normal limits  COMPREHENSIVE METABOLIC PANEL  BRAIN NATRIURETIC PEPTIDE  TROPONIN I   ____________________________________________  EKG  EKG is not currently available  ____________________________________________  RADIOLOGY  Chest x-ray was read as no acute disease there is a large hiatal hernia per radiology CT of the head shows no acute pathology per radiology ____________________________________________   PROCEDURES   ____________________________________________   INITIAL IMPRESSION / ASSESSMENT AND PLAN / ED COURSE  Pertinent labs & imaging results that were available during my care of the patient were reviewed by me and considered in my medical decision making (see chart for details).  ____________________________________________   FINAL CLINICAL IMPRESSION(S) / ED DIAGNOSES  Final diagnoses:  Urinary tract infection without hematuria, site unspecified      Nena Polio, MD 05/13/15 [phone]

## 2015-05-12 NOTE — ED Notes (Signed)
Patient transported to X-ray 

## 2015-05-12 NOTE — ED Notes (Signed)
Pt unable to void at this time. 

## 2015-05-24 DIAGNOSIS — R296 Repeated falls: Secondary | ICD-10-CM | POA: Diagnosis present

## 2015-08-22 ENCOUNTER — Emergency Department: Payer: Medicaid Other

## 2015-08-22 ENCOUNTER — Encounter: Payer: Self-pay | Admitting: Medical Oncology

## 2015-08-22 ENCOUNTER — Emergency Department
Admission: EM | Admit: 2015-08-22 | Discharge: 2015-08-22 | Disposition: A | Discharge status: Home / Self Care | Payer: Medicaid Other | Attending: Emergency Medicine | Admitting: Emergency Medicine

## 2015-08-22 DIAGNOSIS — R079 Chest pain, unspecified: Secondary | ICD-10-CM | POA: Diagnosis not present

## 2015-08-22 DIAGNOSIS — Z79899 Other long term (current) drug therapy: Secondary | ICD-10-CM | POA: Diagnosis not present

## 2015-08-22 DIAGNOSIS — F17210 Nicotine dependence, cigarettes, uncomplicated: Secondary | ICD-10-CM | POA: Diagnosis not present

## 2015-08-22 DIAGNOSIS — Z7951 Long term (current) use of inhaled steroids: Secondary | ICD-10-CM | POA: Diagnosis not present

## 2015-08-22 HISTORY — DX: Unspecified asthma, uncomplicated: J45.909

## 2015-08-22 LAB — BASIC METABOLIC PANEL
ANION GAP: 7 (ref 5–15)
BUN: 9 mg/dL (ref 6–20)
CALCIUM: 9 mg/dL (ref 8.9–10.3)
CO2: 27 mmol/L (ref 22–32)
CREATININE: 0.74 mg/dL (ref 0.44–1.00)
Chloride: 106 mmol/L (ref 101–111)
Glucose, Bld: 90 mg/dL (ref 65–99)
Potassium: 3.9 mmol/L (ref 3.5–5.1)
SODIUM: 140 mmol/L (ref 135–145)

## 2015-08-22 LAB — CBC
HCT: 39.8 % (ref 35.0–47.0)
Hemoglobin: 13.6 g/dL (ref 12.0–16.0)
MCH: 34.9 pg — ABNORMAL HIGH (ref 26.0–34.0)
MCHC: 34.2 g/dL (ref 32.0–36.0)
MCV: 102 fL — AB (ref 80.0–100.0)
PLATELETS: 177 10*3/uL (ref 150–440)
RBC: 3.9 MIL/uL (ref 3.80–5.20)
RDW: 14.8 % — AB (ref 11.5–14.5)
WBC: 5.5 10*3/uL (ref 3.6–11.0)

## 2015-08-22 LAB — TROPONIN I

## 2015-08-22 MED ORDER — IOHEXOL 350 MG/ML SOLN
75.0000 mL | Freq: Once | INTRAVENOUS | Status: AC | PRN
  Administered 2015-08-22: 75 mL via INTRAVENOUS

## 2015-08-22 NOTE — ED Notes (Signed)

## 2015-08-22 NOTE — ED Notes (Signed)
Patient transported to X-ray 

## 2015-08-22 NOTE — ED Notes (Signed)
Pt from home via ems with reports of left sided rib pain, pt reports pain worsens with movement. Pt reports fall at home 3 days ago also.

## 2015-08-22 NOTE — Discharge Instructions (Signed)

## 2015-08-22 NOTE — ED Provider Notes (Signed)
[facility] Emergency Department Provider Note  ____________________________________________    I have reviewed the triage vital signs and the nursing notes.   HISTORY  Chief Complaint rib pain     HPI Marcia Brown is a 60 y.o. female who presents with left sided chest pain. She reports the pain is intermittent but when it occurs it is very severe. She denies shortness of breath. She does report a fall 3 days ago but reports she did notinjure her chest at that time. No fevers or chills. No cough. No recent travel. No calf pain. No history of DVT.      Past Medical History  Diagnosis Date  . COPD (chronic obstructive pulmonary disease) ([place])   . Seizures ([place])   . Sleep apnea   . High cholesterol     Patient Active Problem List   Diagnosis Date Noted  . Seizure ([place]) 02/03/2015    History reviewed. No pertinent past surgical history.  Current Outpatient Rx  Name  Route  Sig  Dispense  Refill  . acetaminophen (TYLENOL) 500 MG tablet   Oral   Take 500 mg by mouth every 6 (six) hours as needed.         [name] albuterol (PROVENTIL HFA;VENTOLIN HFA) 108 (90 BASE) MCG/ACT inhaler   Inhalation   Inhale 2 puffs into the lungs every 4 (four) hours as needed for wheezing or shortness of breath.         [name] atorvastatin (LIPITOR) 10 MG tablet   Oral   Take 10 mg by mouth daily.         [name] azithromycin (ZITHROMAX Z-PAK) 250 MG tablet      Take 2 tablets (500 mg) on  Day 1,  followed by 1 tablet (250 mg) once daily on Days 2 through 5.   6 each   0   . cholecalciferol (VITAMIN D) 400 UNITS TABS tablet   Oral   Take 400 Units by mouth.         . clonazePAM (KLONOPIN) 0.5 MG tablet   Oral   Take 0.5 mg by mouth 3 (three) times daily as needed for anxiety.         . cyclobenzaprine (FLEXERIL) 5 MG tablet   Oral   Take 5 mg by mouth daily.         . DULoxetine (CYMBALTA) 60 MG capsule   Oral   Take 60 mg by mouth daily.         .  ferrous sulfate 325 (65 FE) MG tablet   Oral   Take 325 mg by mouth 3 (three) times daily with meals.         . folic acid (FOLVITE) 1 MG tablet   Oral   Take 1 mg by mouth daily.         [name] gabapentin (NEURONTIN) 100 MG capsule   Oral   Take 200 mg by mouth 2 (two) times daily.         [name] lacosamide 100 MG TABS   Oral   Take 1 tablet (100 mg total) by mouth 2 (two) times daily.   60 tablet   0   . levalbuterol (XOPENEX) 1.25 MG/3ML nebulizer solution   Nebulization   Take 1.25 mg by nebulization every 8 (eight) hours as needed for wheezing.         . levETIRAcetam (KEPPRA) 1000 MG tablet   Oral   Take 1 tablet (1,000 mg total) by mouth  2 (two) times daily.   60 tablet   0   . mometasone-formoterol (DULERA) 100-5 MCG/ACT AERO   Inhalation   Inhale 2 puffs into the lungs 2 (two) times daily.         . ondansetron (ZOFRAN) 4 MG tablet   Oral   Take 4 mg by mouth every 8 (eight) hours as needed for nausea or vomiting.         [name] oxyCODONE-acetaminophen (PERCOCET/ROXICET) 5-325 MG per tablet   Oral   Take 1 tablet by mouth 2 (two) times daily as needed for severe pain.         . pantoprazole (PROTONIX) 40 MG tablet   Oral   Take 40 mg by mouth daily.         . phenytoin (DILANTIN) 100 MG ER capsule   Oral   Take by mouth 2 (two) times daily.         Vladimir Faster Glycol-Propyl Glycol (SYSTANE) 0.4-0.3 % SOLN   Ophthalmic   Apply 2 drops to eye 2 (two) times daily.           Allergies Aspirin  No family history on file.  Social History Social History  Substance Use Topics  . Smoking status: Current Every Day Smoker -- 0.50 packs/day    Types: Cigarettes  . Smokeless tobacco: None  . Alcohol Use: No    Review of Systems  Constitutional: Negative for fever. Eyes: Negative for redness ENT: Negative for sore throat Cardiovascular: As above Respiratory: Negative for shortness of breath. Gastrointestinal: Negative for abdominal  pain Genitourinary: Negative for dysuria. Musculoskeletal: Negative for back pain. Skin: Negative for rash. Neurological: Negative for focal weakness Psychiatric: no anxiety    ____________________________________________   PHYSICAL EXAM:  VITAL SIGNS: ED Triage Vitals  Enc Vitals Group     BP 08/22/15 1209 136/88 mmHg     Pulse Rate 08/22/15 1209 60     Resp 08/22/15 1209 17     Temp 08/22/15 1209 98.2 F (36.8 C)     Temp Source 08/22/15 1209 Oral     SpO2 08/22/15 1209 98 %     Weight 08/22/15 1209 203 lb (92.08 kg)     Height 08/22/15 1209 5\' 4"  (1.626 m)     Head Cir --      Peak Flow --      Pain Score 08/22/15 1210 0     Pain Loc --      Pain Edu? --      Excl. in [place]? --      Constitutional: Alert and oriented. Well appearing and in no distress.  Eyes: Conjunctivae are normal. No erythema or injection ENT   Head: Normocephalic and atraumatic.   Mouth/Throat: Mucous membranes are moist. Cardiovascular: Normal rate, regular rhythm. Normal and symmetric distal pulses are present in the upper extremities. No chest wall tenderness to palpation Respiratory: Normal respiratory effort without tachypnea nor retractions. Breath sounds are clear and equal bilaterally.  Gastrointestinal: Soft and non-tender in all quadrants. No distention. There is no CVA tenderness. Genitourinary: deferred Musculoskeletal: Nontender with normal range of motion in all extremities. No lower extremity tenderness nor edema. Neurologic:  Normal speech and language. No gross focal neurologic deficits are appreciated. Skin:  Skin is warm, dry and intact. No rash noted. Psychiatric: Mood and affect are normal. Patient exhibits appropriate insight and judgment.  ____________________________________________    LABS (pertinent positives/negatives)  Labs Reviewed  CBC - Abnormal; Notable for the following:  MCV 102.0 (*)    MCH 34.9 (*)    RDW 14.8 (*)    All other components within  normal limits  BASIC METABOLIC PANEL  TROPONIN I    ____________________________________________   EKG  ED ECG REPORT I, [name], the attending physician, personally viewed and interpreted this ECG.   Date: 08/22/2015  EKG Time: 12:08 PM  Rate: 61  Rhythm: normal sinus rhythm  Axis: Normal  Intervals:none  ST&T Change: None   ____________________________________________    RADIOLOGY  X-ray unremarkable CT angiography of chest is unremarkable, discussed hiatal hernia with patient ____________________________________________   PROCEDURES  Procedure(s) performed: none  Critical Care performed:none  ____________________________________________   INITIAL IMPRESSION / ASSESSMENT AND PLAN / ED COURSE  Pertinent labs & imaging results that were available during my care of the patient were reviewed by me and considered in my medical decision making (see chart for details).  She presents with left-sided chest pain which she reports was there prior to her fall. She does appear to have  pain intermittently but not currently, she does have a hiatal hernia on chest x-ray. We will obtain CT angiography to further evaluate the chest for possible PE  CTA is unremarkable. Patient is pain-free and feels well. I have asked her to follow up with her physician for further workup. Lab work is reassuring.  ____________________________________________   FINAL CLINICAL IMPRESSION(S) / ED DIAGNOSES  Final diagnoses:  Chest pain, unspecified chest pain type          [name], MD 08/22/15 1515

## 2015-08-22 NOTE — ED Notes (Signed)
This RN spoke with [place], [employer], at [facility]. She states they do not have anyone to transport patient home. Pt notified.

## 2015-09-04 ENCOUNTER — Encounter: Payer: Self-pay | Admitting: Emergency Medicine

## 2015-09-04 ENCOUNTER — Emergency Department
Admission: EM | Admit: 2015-09-04 | Discharge: 2015-09-04 | Disposition: A | Discharge status: Home / Self Care | Payer: Medicaid Other | Attending: Emergency Medicine | Admitting: Emergency Medicine

## 2015-09-04 ENCOUNTER — Emergency Department: Payer: Medicaid Other

## 2015-09-04 DIAGNOSIS — N390 Urinary tract infection, site not specified: Secondary | ICD-10-CM | POA: Diagnosis not present

## 2015-09-04 DIAGNOSIS — Y998 Other external cause status: Secondary | ICD-10-CM | POA: Insufficient documentation

## 2015-09-04 DIAGNOSIS — W01198A Fall on same level from slipping, tripping and stumbling with subsequent striking against other object, initial encounter: Secondary | ICD-10-CM | POA: Insufficient documentation

## 2015-09-04 DIAGNOSIS — S0012XA Contusion of left eyelid and periocular area, initial encounter: Secondary | ICD-10-CM | POA: Diagnosis not present

## 2015-09-04 DIAGNOSIS — S199XXA Unspecified injury of neck, initial encounter: Secondary | ICD-10-CM | POA: Insufficient documentation

## 2015-09-04 DIAGNOSIS — F17210 Nicotine dependence, cigarettes, uncomplicated: Secondary | ICD-10-CM | POA: Diagnosis not present

## 2015-09-04 DIAGNOSIS — Y9389 Activity, other specified: Secondary | ICD-10-CM | POA: Diagnosis not present

## 2015-09-04 DIAGNOSIS — Y9289 Other specified places as the place of occurrence of the external cause: Secondary | ICD-10-CM | POA: Diagnosis not present

## 2015-09-04 DIAGNOSIS — Z792 Long term (current) use of antibiotics: Secondary | ICD-10-CM | POA: Insufficient documentation

## 2015-09-04 DIAGNOSIS — Z79899 Other long term (current) drug therapy: Secondary | ICD-10-CM | POA: Diagnosis not present

## 2015-09-04 DIAGNOSIS — S0083XA Contusion of other part of head, initial encounter: Secondary | ICD-10-CM | POA: Diagnosis not present

## 2015-09-04 DIAGNOSIS — S0993XA Unspecified injury of face, initial encounter: Secondary | ICD-10-CM | POA: Diagnosis present

## 2015-09-04 LAB — URINALYSIS COMPLETE WITH MICROSCOPIC (ARMC ONLY)
Bacteria, UA: NONE SEEN
Bilirubin Urine: NEGATIVE
Glucose, UA: NEGATIVE mg/dL
KETONES UR: NEGATIVE mg/dL
Nitrite: POSITIVE — AB
PH: 7 (ref 5.0–8.0)
PROTEIN: NEGATIVE mg/dL
Specific Gravity, Urine: 1.008 (ref 1.005–1.030)

## 2015-09-04 MED ORDER — CEPHALEXIN 500 MG PO CAPS: 500.0000 mg | ORAL_CAPSULE | Freq: Two times a day (BID) | ORAL | Status: DC

## 2015-09-04 MED ORDER — CEFTRIAXONE SODIUM 1 G IJ SOLR
1.0000 g | Freq: Once | INTRAMUSCULAR | Status: AC
  Administered 2015-09-04: 1 g via INTRAMUSCULAR
  Filled 2015-09-04: qty 10
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
## 2015-09-04 NOTE — ED Notes (Signed)
NAD noted at time of D/C. Pt denies questions or concerns. Pt ambulatory to the lobby at this time.  

## 2015-09-04 NOTE — ED Notes (Signed)
Spoke with patient's niece regarding picking patient up. Pt niece states she will be here as soon as possible.

## 2015-09-04 NOTE — Discharge Instructions (Signed)
Fortunately we did not identify any new injuries to your head or neck from your recent fall.  However, given your multiple falls we checked a urinalysis and found the you have a urinary tract infection.  It is very likely that this is causing you to have more symptoms than usual and may have resulted in some of your falls.  We gave you a dose of antibiotics (ceftriaxone 1 g IM) and a prescription for you to start taking tonight.  Please take the full course of antibiotics as prescribed in addition to all of your regular medications..  Follow-up with your regular doctor within a week.  Return to the emergency department with new or worsening symptoms that concern you.   Facial or Scalp Contusion  A facial or scalp contusion is a deep bruise on the face or head. Contusions happen when an injury causes bleeding under the skin. Signs of bruising include pain, puffiness (swelling), and discolored skin. The contusion may turn blue, purple, or yellow. HOME CARE  Only take medicines as told by your doctor.  Put ice on the injured area.  Put ice in a plastic bag.  Place a towel between your skin and the bag.  Leave the ice on for 20 minutes, 2-3 times a day. GET HELP IF:  You have bite problems.  You have pain when chewing.  You are worried about your face not healing normally. GET HELP RIGHT AWAY IF:   You have severe pain or a headache and medicine does not help.  You are very tired or confused, or your personality changes.  You throw up (vomit).  You have a nosebleed that will not stop.  You see two of everything (double vision) or have blurry vision.  You have fluid coming from your nose or ear.  You have problems walking or using your arms or legs. MAKE SURE YOU:   Understand these instructions.  Will watch your condition.  Will get help right away if you are not doing well or get worse.   This information is not intended to replace advice given to you by your health care  provider. Make sure you discuss any questions you have with your health care provider.   Document Released: 05/17/2011 Document Revised: 06/18/2014 Document Reviewed: 01/08/2013 Elsevier Interactive Patient Education 2016 Elsevier Inc.  Urinary Tract Infection A urinary tract infection (UTI) can occur any place along the urinary tract. The tract includes the kidneys, ureters, bladder, and urethra. A type of germ called bacteria often causes a UTI. UTIs are often helped with antibiotic medicine.  HOME CARE   If given, take antibiotics as told by your doctor. Finish them even if you start to feel better.  Drink enough fluids to keep your pee (urine) clear or pale yellow.  Avoid tea, drinks with caffeine, and bubbly (carbonated) drinks.  Pee often. Avoid holding your pee in for a long time.  Pee before and after having sex (intercourse).  Wipe from front to back after you poop (bowel movement) if you are a woman. Use each tissue only once. GET HELP RIGHT AWAY IF:   You have back pain.  You have lower belly (abdominal) pain.  You have chills.  You feel sick to your stomach (nauseous).  You throw up (vomit).  Your burning or discomfort with peeing does not go away.  You have a fever.  Your symptoms are not better in 3 days. MAKE SURE YOU:   Understand these instructions.  Will watch your  condition.  Will get help right away if you are not doing well or get worse.   This information is not intended to replace advice given to you by your health care provider. Make sure you discuss any questions you have with your health care provider.   Document Released: 11/14/2007 Document Revised: 06/18/2014 Document Reviewed: 12/27/2011 Elsevier Interactive Patient Education [employer].

## 2015-09-06 LAB — URINE CULTURE: SPECIAL REQUESTS: NORMAL

## 2015-10-12 ENCOUNTER — Emergency Department
Admission: EM | Admit: 2015-10-12 | Discharge: 2015-10-13 | Disposition: A | Discharge status: Home / Self Care | Payer: Medicaid Other | Attending: Emergency Medicine | Admitting: Emergency Medicine

## 2015-10-12 ENCOUNTER — Encounter: Payer: Self-pay | Admitting: Emergency Medicine

## 2015-10-12 DIAGNOSIS — F17210 Nicotine dependence, cigarettes, uncomplicated: Secondary | ICD-10-CM | POA: Insufficient documentation

## 2015-10-12 DIAGNOSIS — J449 Chronic obstructive pulmonary disease, unspecified: Secondary | ICD-10-CM | POA: Insufficient documentation

## 2015-10-12 DIAGNOSIS — Z79899 Other long term (current) drug therapy: Secondary | ICD-10-CM | POA: Diagnosis not present

## 2015-10-12 DIAGNOSIS — G40909 Epilepsy, unspecified, not intractable, without status epilepticus: Secondary | ICD-10-CM | POA: Insufficient documentation

## 2015-10-12 DIAGNOSIS — J45909 Unspecified asthma, uncomplicated: Secondary | ICD-10-CM | POA: Insufficient documentation

## 2015-10-12 DIAGNOSIS — R569 Unspecified convulsions: Secondary | ICD-10-CM

## 2015-10-12 DIAGNOSIS — Z7951 Long term (current) use of inhaled steroids: Secondary | ICD-10-CM | POA: Diagnosis not present

## 2015-10-12 LAB — BASIC METABOLIC PANEL
Anion gap: 6 (ref 5–15)
BUN: 15 mg/dL (ref 6–20)
CALCIUM: 9.2 mg/dL (ref 8.9–10.3)
CO2: 27 mmol/L (ref 22–32)
CREATININE: 0.89 mg/dL (ref 0.44–1.00)
Chloride: 106 mmol/L (ref 101–111)
GFR calc Af Amer: >60 mL/min (ref >60)
GLUCOSE: 114 mg/dL — AB (ref 65–99)
Potassium: 3.6 mmol/L (ref 3.5–5.1)
SODIUM: 139 mmol/L (ref 135–145)

## 2015-10-12 LAB — GLUCOSE, CAPILLARY: Glucose-Capillary: 124 mg/dL — ABNORMAL HIGH (ref 65–99)

## 2015-10-12 LAB — CBC
HCT: 40.6 % (ref 35.0–47.0)
Hemoglobin: 13.9 g/dL (ref 12.0–16.0)
MCH: 34.3 pg — AB (ref 26.0–34.0)
MCHC: 34.2 g/dL (ref 32.0–36.0)
MCV: 100.1 fL — ABNORMAL HIGH (ref 80.0–100.0)
PLATELETS: 177 10*3/uL (ref 150–440)
RBC: 4.06 MIL/uL (ref 3.80–5.20)
RDW: 14.5 % (ref 11.5–14.5)
WBC: 4.8 10*3/uL (ref 3.6–11.0)

## 2015-10-12 LAB — PHENYTOIN LEVEL, TOTAL: Phenytoin Lvl: 14.3 ug/mL (ref 10.0–20.0)

## 2015-10-12 NOTE — ED Notes (Signed)
MD at bedside. 

## 2015-10-12 NOTE — ED Notes (Signed)
Pt arrived from the [facility] by EMS, post seizure @ 2010. EMS reports staff stated pt was witnessed by roommate having seizure like activity. When EMS got to pt, pt was postictal and is still postictal upon arrival to ED. Pt able to talk, unable to communicate where she is or events that happened.

## 2015-10-12 NOTE — ED Provider Notes (Signed)
[facility] Emergency Department Provider Note  ____________________________________________  Time seen: Approximately 9:41 PM  I have reviewed the triage vital signs and the nursing notes.   HISTORY  Chief Complaint Seizures    HPI Marcia Brown is a 60 y.o. 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  She lives at the [facility] and was transported to the ED today after her roommate witnessed her having a seizure.  When EMS arrived she was no longer seizing but was somnolent and confused (post-ictal).  She is currently at her mental status baseline (I have seen her in the past) but does not remember the seizure or events immediately leading up to it.  However she was in bed at the time and did not sustain any traumatic injury.  Currently she denies HA, fever/chills, CP, SOB, abd pain, N/V/D, dysuria.  Seizure duration unknown.  Onset acute, now resolved.  Intensity was severe.   Past Medical History  Diagnosis Date  . COPD (chronic obstructive pulmonary disease) ([place])   . Seizures ([place])   . Sleep apnea   . High cholesterol   . Asthma     Patient Active Problem List   Diagnosis Date Noted  . Seizure ([place]) 02/03/2015    Past Surgical History  Procedure Laterality Date  . Esophageal dilation      Current Outpatient Rx  Name  Route  Sig  Dispense  Refill  . acetaminophen (TYLENOL) 500 MG tablet   Oral   Take 500 mg by mouth every 8 (eight) hours as needed for mild pain.          [name] albuterol (PROVENTIL HFA;VENTOLIN HFA) 108 (90 BASE) MCG/ACT inhaler   Inhalation   Inhale 2 puffs into the lungs every 4 (four) hours as needed for wheezing or shortness of breath.         [name] atorvastatin (LIPITOR) 10 MG tablet   Oral   Take 10 mg by mouth at bedtime.          . cholecalciferol (VITAMIN D) 400 UNITS TABS tablet   Oral   Take 400 Units by mouth daily.          . clonazePAM (KLONOPIN) 0.5  MG tablet   Oral   Take 0.5 mg by mouth 3 (three) times daily.          . cyanocobalamin 500 MCG tablet   Oral   Take 500 mcg by mouth daily.         . DULoxetine (CYMBALTA) 60 MG capsule   Oral   Take 60 mg by mouth daily.         . ferrous sulfate 325 (65 FE) MG tablet   Oral   Take 325 mg by mouth 3 (three) times daily with meals.         . folic acid (FOLVITE) 1 MG tablet   Oral   Take 1 mg by mouth daily.         [name] gabapentin (NEURONTIN) 100 MG capsule   Oral   Take 200 mg by mouth 2 (two) times daily.         [name] guaiFENesin (ROBITUSSIN) 100 MG/5ML SOLN   Oral   Take 10 mLs by mouth 3 (three) times daily.         [name] lacosamide 100 MG TABS   Oral   Take 1 tablet (100 mg total) by mouth 2 (two) times daily.  60 tablet   0   . lamoTRIgine (LAMICTAL) 100 MG tablet   Oral   Take 100 mg by mouth 2 (two) times daily. Pt takes with a 25mg  tablet.         . lamoTRIgine (LAMICTAL) 25 MG tablet   Oral   Take 25 mg by mouth 2 (two) times daily. Pt takes with a 100mg  tablet.         . levalbuterol (XOPENEX) 1.25 MG/3ML nebulizer solution   Nebulization   Take 1.25 mg by nebulization 3 (three) times daily. Pt also uses three times daily as needed for wheezing/shortness of breath.         . levETIRAcetam (KEPPRA) 750 MG tablet   Oral   Take 750 mg by mouth 2 (two) times daily.         . mometasone-formoterol (DULERA) 100-5 MCG/ACT AERO   Inhalation   Inhale 2 puffs into the lungs 2 (two) times daily.         . ondansetron (ZOFRAN) 4 MG tablet   Oral   Take 4 mg by mouth every 4 (four) hours as needed for nausea or vomiting.          . pantoprazole (PROTONIX) 40 MG tablet   Oral   Take 40 mg by mouth daily before breakfast.          . phenytoin (DILANTIN) 100 MG ER capsule   Oral   Take 100 mg by mouth 2 (two) times daily.          Vladimir Faster Glycol-Propyl Glycol (SYSTANE) 0.4-0.3 % SOLN   Both Eyes   Place 2 drops into both eyes  2 (two) times daily.          . traMADol (ULTRAM) 50 MG tablet   Oral   Take 50 mg by mouth 2 (two) times daily as needed for moderate pain.         . cephALEXin (KEFLEX) 500 MG capsule   Oral   Take 1 capsule (500 mg total) by mouth 2 (two) times daily. Patient not taking: Reported on 10/12/2015   14 capsule   0     Allergies Aspirin  History reviewed. No pertinent family history.  Social History Social History  Substance Use Topics  . Smoking status: Current Every Day Smoker -- 0.50 packs/day    Types: Cigarettes  . Smokeless tobacco: Never Used  . Alcohol Use: No    Review of Systems Constitutional: No fever/chills Eyes: No visual changes. ENT: No sore throat. Cardiovascular: Denies chest pain. Respiratory: Denies shortness of breath. Gastrointestinal: No abdominal pain.  No nausea, no vomiting.  No diarrhea.  No constipation. Genitourinary: Negative for dysuria. Musculoskeletal: Negative for back pain. Skin: Negative for rash. Neurological: Reported seizure-like activity. Negative for headaches, focal weakness or numbness.  10-point ROS otherwise negative.  ____________________________________________   PHYSICAL EXAM:  VITAL SIGNS: ED Triage Vitals  Enc Vitals Group     BP 10/12/15 2043 134/65 mmHg     Pulse Rate 10/12/15 2043 72     Resp 10/12/15 2043 20     Temp 10/12/15 2043 98.4 F (36.9 C)     Temp Source 10/12/15 2043 Oral     SpO2 10/12/15 2039 98 %     Weight 10/12/15 2043 208 lb (94.348 kg)     Height 10/12/15 2043 5\' 3"  (1.6 m)     Head Cir --      Peak Flow --  Pain Score --      Pain Loc --      Pain Edu? --      Excl. in [place]? --     Constitutional: Alert and at baseline mental status.  Eyes: Conjunctivae are normal. PERRL. EOMI. Head: Atraumatic. Nose: No congestion/rhinnorhea. Mouth/Throat: Mucous membranes are moist.  Oropharynx non-erythematous. Neck: No stridor.  No meningeal signs.  No cervical spine tenderness to  palpation. Cardiovascular: Normal rate, regular rhythm. Good peripheral circulation. Grossly normal heart sounds.   Respiratory: Normal respiratory effort.  No retractions. Lungs CTAB. Gastrointestinal: Soft and nontender. No distention.  Musculoskeletal: No lower extremity tenderness nor edema. No gross deformities of extremities. Neurologic:  Normal speech and language. No gross focal neurologic deficits are appreciated.  Skin:  Skin is warm, dry and intact. No rash noted.  ____________________________________________   LABS (all labs ordered are listed, but only abnormal results are displayed)  Labs Reviewed  CBC - Abnormal; Notable for the following:    MCV 100.1 (*)    MCH 34.3 (*)    All other components within normal limits  BASIC METABOLIC PANEL - Abnormal; Notable for the following:    Glucose, Bld 114 (*)    All other components within normal limits  GLUCOSE, CAPILLARY - Abnormal; Notable for the following:    Glucose-Capillary 124 (*)    All other components within normal limits  PHENYTOIN LEVEL, TOTAL  CBG MONITORING, ED   ____________________________________________  EKG  ED ECG REPORT I, Linken Mcglothen, the attending physician, personally viewed and interpreted this ECG.  Date: 10/12/2015 EKG Time: 20:40 Rate: 72 Rhythm: normal sinus rhythm QRS Axis: normal Intervals: non-specific intraventricular conduction delay ST/T Wave abnormalities: normal Conduction Disturbances: none Narrative Interpretation: unremarkable  ____________________________________________  RADIOLOGY   No results found.  ____________________________________________   PROCEDURES  Procedure(s) performed: None  Critical Care performed: No ____________________________________________   INITIAL IMPRESSION / ASSESSMENT AND PLAN / ED COURSE  Pertinent labs & imaging results that were available during my care of the patient were reviewed by me and considered in my medical  decision making (see chart for details).  Known seizure disorder, no trauma tonight.  VSS, afebrile.  History of UTIs.  Will check basic labs including UA and reassess.  ----------------------------------------- 12:10 AM on 10/13/2015 -----------------------------------------  Workup is reassuring although we are still awaiting a urinalysis.  She was positive for Escherichia coli last time.  I am transferring emergency department care to Dr. Owens Shark to follow-up urinalysis but I anticipate discharge back to her facility.  Her phenytoin level is within therapeutic levels.  ____________________________________________  FINAL CLINICAL IMPRESSION(S) / ED DIAGNOSES  Final diagnoses:  Seizure ([place])     MEDICATIONS GIVEN DURING THIS VISIT:  Medications - No data to display   NEW OUTPATIENT MEDICATIONS STARTED DURING THIS VISIT:  New Prescriptions   No medications on file      Note:  This document was prepared using Dragon voice recognition software and may include unintentional dictation errors.   Hinda Kehr, MD 10/13/15 [phone]

## 2015-10-13 LAB — URINALYSIS COMPLETE WITH MICROSCOPIC (ARMC ONLY)
Bilirubin Urine: NEGATIVE
GLUCOSE, UA: NEGATIVE mg/dL
Ketones, ur: NEGATIVE mg/dL
Leukocytes, UA: NEGATIVE
NITRITE: NEGATIVE
PH: 7 (ref 5.0–8.0)
PROTEIN: NEGATIVE mg/dL
Specific Gravity, Urine: 1.004 — ABNORMAL LOW (ref 1.005–1.030)

## 2015-10-13 NOTE — ED Provider Notes (Signed)
Assumed care of the patient from Dr. Karma Greaser at 11:30 PM. Urinalysis revealed no evidence of urinary tract infection in this patient with known seizure disorder with a seizure which occurred tonight at 8:10 PM.  Gregor Hams, MD 10/13/15 [phone]

## 2015-10-13 NOTE — Discharge Instructions (Signed)

## 2015-11-26 ENCOUNTER — Emergency Department
Admission: EM | Admit: 2015-11-26 | Discharge: 2015-11-26 | Disposition: A | Discharge status: Home / Self Care | Payer: Medicaid Other | Attending: Emergency Medicine | Admitting: Emergency Medicine

## 2015-11-26 ENCOUNTER — Encounter: Payer: Self-pay | Admitting: Emergency Medicine

## 2015-11-26 DIAGNOSIS — F17210 Nicotine dependence, cigarettes, uncomplicated: Secondary | ICD-10-CM | POA: Insufficient documentation

## 2015-11-26 DIAGNOSIS — G40909 Epilepsy, unspecified, not intractable, without status epilepticus: Secondary | ICD-10-CM | POA: Insufficient documentation

## 2015-11-26 DIAGNOSIS — Z79899 Other long term (current) drug therapy: Secondary | ICD-10-CM | POA: Insufficient documentation

## 2015-11-26 DIAGNOSIS — J45909 Unspecified asthma, uncomplicated: Secondary | ICD-10-CM | POA: Insufficient documentation

## 2015-11-26 DIAGNOSIS — J449 Chronic obstructive pulmonary disease, unspecified: Secondary | ICD-10-CM | POA: Insufficient documentation

## 2015-11-26 DIAGNOSIS — R569 Unspecified convulsions: Secondary | ICD-10-CM | POA: Diagnosis present

## 2015-11-26 HISTORY — DX: Anxiety disorder, unspecified: F41.9

## 2015-11-26 LAB — CBC WITH DIFFERENTIAL/PLATELET
BASOS ABS: 0 10*3/uL (ref 0–0.1)
BASOS PCT: 1 %
EOS ABS: 0.1 10*3/uL (ref 0–0.7)
EOS PCT: 3 %
HCT: 35.2 % (ref 35.0–47.0)
Hemoglobin: 12 g/dL (ref 12.0–16.0)
Lymphocytes Relative: 25 %
Lymphs Abs: 1 10*3/uL (ref 1.0–3.6)
MCH: 34.7 pg — AB (ref 26.0–34.0)
MCHC: 34.2 g/dL (ref 32.0–36.0)
MCV: 101.5 fL — ABNORMAL HIGH (ref 80.0–100.0)
MONO ABS: 0.4 10*3/uL (ref 0.2–0.9)
Monocytes Relative: 10 %
Neutro Abs: 2.5 10*3/uL (ref 1.4–6.5)
Neutrophils Relative %: 61 %
PLATELETS: 194 10*3/uL (ref 150–440)
RBC: 3.47 MIL/uL — ABNORMAL LOW (ref 3.80–5.20)
RDW: 15.1 % — ABNORMAL HIGH (ref 11.5–14.5)
WBC: 4.2 10*3/uL (ref 3.6–11.0)

## 2015-11-26 LAB — COMPREHENSIVE METABOLIC PANEL
ALBUMIN: 4.1 g/dL (ref 3.5–5.0)
ALK PHOS: 64 U/L (ref 38–126)
ALT: 21 U/L (ref 14–54)
AST: 22 U/L (ref 15–41)
Anion gap: 10 (ref 5–15)
BILIRUBIN TOTAL: 0.3 mg/dL (ref 0.3–1.2)
BUN: 14 mg/dL (ref 6–20)
CALCIUM: 9 mg/dL (ref 8.9–10.3)
CO2: 24 mmol/L (ref 22–32)
Chloride: 106 mmol/L (ref 101–111)
Creatinine, Ser: 0.8 mg/dL (ref 0.44–1.00)
GFR calc Af Amer: >60 mL/min (ref >60)
GLUCOSE: 99 mg/dL (ref 65–99)
Potassium: 4 mmol/L (ref 3.5–5.1)
Sodium: 140 mmol/L (ref 135–145)
TOTAL PROTEIN: 6.5 g/dL (ref 6.5–8.1)

## 2015-11-26 LAB — URINALYSIS COMPLETE WITH MICROSCOPIC (ARMC ONLY)
Bilirubin Urine: NEGATIVE
GLUCOSE, UA: NEGATIVE mg/dL
Ketones, ur: NEGATIVE mg/dL
NITRITE: NEGATIVE
Protein, ur: NEGATIVE mg/dL
SPECIFIC GRAVITY, URINE: 1.01 (ref 1.005–1.030)
pH: 7 (ref 5.0–8.0)

## 2015-11-26 LAB — MAGNESIUM: Magnesium: 2.3 mg/dL (ref 1.7–2.4)

## 2015-11-26 LAB — PHENYTOIN LEVEL, TOTAL: Phenytoin Lvl: 11.6 ug/mL (ref 10.0–20.0)

## 2015-11-26 MED ORDER — LEVETIRACETAM 1000 MG PO TABS: 1000.0000 mg | ORAL_TABLET | Freq: Two times a day (BID) | ORAL | Status: DC

## 2015-11-26 MED ORDER — LEVETIRACETAM 500 MG PO TABS
500.0000 mg | ORAL_TABLET | Freq: Once | ORAL | Status: AC
  Administered 2015-11-26: 500 mg via ORAL
  Filled 2015-11-26: qty 1

## 2015-11-26 NOTE — ED Notes (Signed)
Pt states she takes 3 meds for seizures but doesn't know what they are, what type of seizures she has, or when her last one was. Pt states she has no idea what happened today. Pt does not appear to have bitten tongue or been incontinent. Hx stroke, presents with facial droop at baseline. Pt states seizures are a result of head trauma in 1992.

## 2015-11-26 NOTE — ED Notes (Signed)
Pt to ED via EMS from [facility]. EMS called for seizure, per pt's roommate pt was sitting on the side of the bed with her hands shaking and saying "help me." EMS states pt did not appear post-ictal on their arrival. Hx seizure disorder, has appt with neurologist in July.

## 2015-11-26 NOTE — ED Provider Notes (Signed)
Time Seen: Approximately 10:15  I have reviewed the triage notes  Chief Complaint: Seizures   History of Present Illness: Marcia Brown is a 60 y.o. female who has a long history of seizures. 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 The patient is on Keppra, Dilantin, and Lamictal for seizures. Patient had a seizure witnessed by her roommate where she apparently was shaking her hands and saying "" help me "". Patient by the time EMS arrived appeared to be back to her baseline mental status. She hasn't upcoming appointment with a neurologist in July. She has not had any recent change in her medications. She is not aware of how long the seizure lasted, she did not bite her tongue, urinate or defecate upon herself. She denies any physical complaints at this time.   Past Medical History  Diagnosis Date  . COPD (chronic obstructive pulmonary disease) ([place])   . Seizures ([place])   . Sleep apnea   . High cholesterol   . Asthma   . Anxiety     Patient Active Problem List   Diagnosis Date Noted  . Seizure ([place]) 02/03/2015    Past Surgical History  Procedure Laterality Date  . Esophageal dilation      Past Surgical History  Procedure Laterality Date  . Esophageal dilation      Current Outpatient Rx  Name  Route  Sig  Dispense  Refill  . acetaminophen (TYLENOL) 500 MG tablet   Oral   Take 500 mg by mouth every 8 (eight) hours as needed for mild pain.          [name] albuterol (PROVENTIL HFA;VENTOLIN HFA) 108 (90 BASE) MCG/ACT inhaler   Inhalation   Inhale 2 puffs into the lungs every 4 (four) hours as needed for wheezing or shortness of breath.         [name] atorvastatin (LIPITOR) 10 MG tablet   Oral   Take 10 mg by mouth at bedtime.          . cephALEXin (KEFLEX) 500 MG capsule   Oral   Take 1 capsule (500 mg total) by mouth 2 (two) times daily. Patient not taking: Reported on 10/12/2015   14 capsule   0   . cholecalciferol (VITAMIN D) 400 UNITS  TABS tablet   Oral   Take 400 Units by mouth daily.          . clonazePAM (KLONOPIN) 0.5 MG tablet   Oral   Take 0.5 mg by mouth 3 (three) times daily.          . cyanocobalamin 500 MCG tablet   Oral   Take 500 mcg by mouth daily.         . DULoxetine (CYMBALTA) 60 MG capsule   Oral   Take 60 mg by mouth daily.         . ferrous sulfate 325 (65 FE) MG tablet   Oral   Take 325 mg by mouth 3 (three) times daily with meals.         . folic acid (FOLVITE) 1 MG tablet   Oral   Take 1 mg by mouth daily.         [name] gabapentin (NEURONTIN) 100 MG capsule   Oral   Take 200 mg by mouth 2 (two) times daily.         [name] lacosamide 100 MG TABS   Oral   Take 1 tablet (100 mg total) by mouth 2 (  two) times daily.   60 tablet   0   . lamoTRIgine (LAMICTAL) 100 MG tablet   Oral   Take 100 mg by mouth 2 (two) times daily. Pt takes with a 25mg  tablet.         . lamoTRIgine (LAMICTAL) 25 MG tablet   Oral   Take 25 mg by mouth 2 (two) times daily. Pt takes with a 100mg  tablet.         . levalbuterol (XOPENEX) 1.25 MG/3ML nebulizer solution   Nebulization   Take 1.25 mg by nebulization 3 (three) times daily. Pt also uses three times daily as needed for wheezing/shortness of breath.         . levETIRAcetam (KEPPRA) 1000 MG tablet   Oral   Take 1 tablet (1,000 mg total) by mouth 2 (two) times daily.   60 tablet   0   . mometasone-formoterol (DULERA) 100-5 MCG/ACT AERO   Inhalation   Inhale 2 puffs into the lungs 2 (two) times daily.         . ondansetron (ZOFRAN) 4 MG tablet   Oral   Take 4 mg by mouth every 4 (four) hours as needed for nausea or vomiting.          . pantoprazole (PROTONIX) 40 MG tablet   Oral   Take 40 mg by mouth daily before breakfast.          . phenytoin (DILANTIN) 100 MG ER capsule   Oral   Take 100 mg by mouth 2 (two) times daily.          Vladimir Faster Glycol-Propyl Glycol (SYSTANE) 0.4-0.3 % SOLN   Both Eyes   Place 2 drops  into both eyes 2 (two) times daily.          . traMADol (ULTRAM) 50 MG tablet   Oral   Take 50 mg by mouth 2 (two) times daily as needed for moderate pain.           Allergies:  Aspirin  Family History: History reviewed. No pertinent family history.  Social History: Social History  Substance Use Topics  . Smoking status: Current Every Day Smoker -- 0.50 packs/day    Types: Cigarettes  . Smokeless tobacco: Never Used  . Alcohol Use: No     Review of Systems:   10 point review of systems was performed and was otherwise negative:  Constitutional: No fever Eyes: No visual disturbances ENT: No sore throat, ear pain Cardiac: No chest pain Respiratory: No shortness of breath, wheezing, or stridor Abdomen: No abdominal pain, no vomiting, No diarrhea Endocrine: No weight loss, No night sweats Extremities: No peripheral edema, cyanosis Skin: No rashes, easy bruising Neurologic: No focal weakness, trouble with speech or swollowing  Urologic: No dysuria, Hematuria, or urinary frequency   Physical Exam:  ED Triage Vitals  Enc Vitals Group     BP 11/26/15 0947 111/60 mmHg     Pulse Rate 11/26/15 0947 58     Resp 11/26/15 0947 18     Temp 11/26/15 0947 98.5 F (36.9 C)     Temp Source 11/26/15 0947 Oral     SpO2 11/26/15 0947 96 %     Weight 11/26/15 0947 205 lb (92.987 kg)     Height 11/26/15 0947 5\' 3"  (1.6 m)     Head Cir --      Peak Flow --      Pain Score --      Pain Loc --  Pain Edu? --      Excl. in [place]? --     General: Awake , Alert , and Oriented times 3; GCS 15 Head: Normal cephalic , atraumatic Eyes: Pupils equal , round, reactive to light Nose/Throat: No nasal drainage, patent upper airway without erythema or exudate.  Neck: Supple, Full range of motion, No anterior adenopathy or palpable thyroid masses Lungs: Clear to ascultation without wheezes , rhonchi, or rales Heart: Regular rate, regular rhythm without murmurs , gallops , or  rubs Abdomen: Soft, non tender without rebound, guarding , or rigidity; bowel sounds positive and symmetric in all 4 quadrants. No organomegaly .        Extremities: 2 plus symmetric pulses. No edema, clubbing or cyanosis Neurologic: normal ambulation, Motor symmetric without deficits, sensory intact Skin: warm, dry, no rashes   Labs:   All laboratory work was reviewed including any pertinent negatives or positives listed below:  Labs Reviewed  CBC WITH DIFFERENTIAL/PLATELET - Abnormal; Notable for the following:    RBC 3.47 (*)    MCV 101.5 (*)    MCH 34.7 (*)    RDW 15.1 (*)    All other components within normal limits  URINALYSIS COMPLETEWITH MICROSCOPIC ([facility] ONLY) - Abnormal; Notable for the following:    Color, Urine YELLOW (*)    APPearance CLEAR (*)    Hgb urine dipstick 2+ (*)    Leukocytes, UA 1+ (*)    Bacteria, UA MANY (*)    Squamous Epithelial / LPF 0-5 (*)    All other components within normal limits  URINE CULTURE  COMPREHENSIVE METABOLIC PANEL  MAGNESIUM  PHENYTOIN LEVEL, TOTAL  The patient's urine has some findings of urinary tract infection, I did not start her on antibiotics but did place a urine culture.   ED Course: Patient's stay here was uneventful and she had no further seizure activity. I decided to increase her Keppra and moved it from 750 mg twice a day to 1000 mg twice a day. The patient was advised to continue all of her other current medications and plan on her follow-up with her neurologist. All questions and concerns were addressed at the bedside.    Assessment: Acute exacerbation of chronic seizure disorder     Plan: * Outpatient Increase Keppra 1000 mg twice a day Patient was advised to return immediately if condition worsens. Patient was advised to follow up with their primary care physician or other specialized physicians involved in their outpatient care. The patient and/or family member/power of attorney had laboratory results  reviewed at the bedside. All questions and concerns were addressed and appropriate discharge instructions were distributed by the nursing staff.             Daymon Larsen, MD 11/26/15 [phone]

## 2015-11-26 NOTE — ED Notes (Signed)
Discussed discharge instructions, prescriptions, and follow-up care with patient. No questions or concerns at this time. Pt to return to [facility] via EMS.

## 2015-11-27 ENCOUNTER — Emergency Department
Admission: EM | Admit: 2015-11-27 | Discharge: 2015-11-28 | Disposition: A | Discharge status: Home / Self Care | Payer: Medicaid Other | Attending: Emergency Medicine | Admitting: Emergency Medicine

## 2015-11-27 ENCOUNTER — Encounter: Payer: Self-pay | Admitting: *Deleted

## 2015-11-27 DIAGNOSIS — F17210 Nicotine dependence, cigarettes, uncomplicated: Secondary | ICD-10-CM | POA: Insufficient documentation

## 2015-11-27 DIAGNOSIS — R569 Unspecified convulsions: Secondary | ICD-10-CM | POA: Diagnosis present

## 2015-11-27 DIAGNOSIS — J45909 Unspecified asthma, uncomplicated: Secondary | ICD-10-CM | POA: Insufficient documentation

## 2015-11-27 DIAGNOSIS — G40909 Epilepsy, unspecified, not intractable, without status epilepticus: Secondary | ICD-10-CM | POA: Insufficient documentation

## 2015-11-27 DIAGNOSIS — J449 Chronic obstructive pulmonary disease, unspecified: Secondary | ICD-10-CM | POA: Diagnosis not present

## 2015-11-27 DIAGNOSIS — Z79899 Other long term (current) drug therapy: Secondary | ICD-10-CM | POA: Insufficient documentation

## 2015-11-27 LAB — CBC
HCT: 34.7 % — ABNORMAL LOW (ref 35.0–47.0)
Hemoglobin: 11.7 g/dL — ABNORMAL LOW (ref 12.0–16.0)
MCH: 34.4 pg — ABNORMAL HIGH (ref 26.0–34.0)
MCHC: 33.7 g/dL (ref 32.0–36.0)
MCV: 102.2 fL — AB (ref 80.0–100.0)
PLATELETS: 188 10*3/uL (ref 150–440)
RBC: 3.4 MIL/uL — AB (ref 3.80–5.20)
RDW: 15.1 % — ABNORMAL HIGH (ref 11.5–14.5)
WBC: 5.7 10*3/uL (ref 3.6–11.0)

## 2015-11-27 LAB — COMPREHENSIVE METABOLIC PANEL
ALBUMIN: 4.1 g/dL (ref 3.5–5.0)
ALK PHOS: 67 U/L (ref 38–126)
ALT: 23 U/L (ref 14–54)
AST: 34 U/L (ref 15–41)
Anion gap: 8 (ref 5–15)
BILIRUBIN TOTAL: 0.5 mg/dL (ref 0.3–1.2)
BUN: 14 mg/dL (ref 6–20)
CO2: 24 mmol/L (ref 22–32)
CREATININE: 0.89 mg/dL (ref 0.44–1.00)
Calcium: 8.5 mg/dL — ABNORMAL LOW (ref 8.9–10.3)
Chloride: 104 mmol/L (ref 101–111)
GFR calc Af Amer: >60 mL/min (ref >60)
GLUCOSE: 90 mg/dL (ref 65–99)
POTASSIUM: 4.3 mmol/L (ref 3.5–5.1)
Sodium: 136 mmol/L (ref 135–145)
TOTAL PROTEIN: 6.6 g/dL (ref 6.5–8.1)

## 2015-11-27 LAB — PHENYTOIN LEVEL, TOTAL: Phenytoin Lvl: 11.4 ug/mL (ref 10.0–20.0)

## 2015-11-27 MED ORDER — SODIUM CHLORIDE 0.9 % IV SOLN
1000.0000 mg | Freq: Once | INTRAVENOUS | Status: AC
  Administered 2015-11-28: 1000 mg via INTRAVENOUS
  Filled 2015-11-27: qty 10

## 2015-11-27 NOTE — ED Notes (Signed)
Pt brought in via ems from the [facility].  Pt was sitting outside smoking and started shaking.  Ems report no seizure activity noted.  Pt not postictal.  Pt alert on arrival to room 5.

## 2015-11-27 NOTE — ED Provider Notes (Signed)
[facility] Emergency Department Provider Note  ____________________________________________  Time seen: 11:40 PM  I have reviewed the triage vital signs and the nursing notes.   HISTORY  Chief Complaint Seizures     HPI Marcia Brown is a 60 y.o. 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 Patient states that she was outside smoking a cigarette" started feeling weird" with subsequent witness seizure. Patient states protein consistent with previous seizures. She states that she has a seizure at least every other day at baseline.    Past Medical History  Diagnosis Date  . COPD (chronic obstructive pulmonary disease) ([place])   . Seizures ([place])   . Sleep apnea   . High cholesterol   . Asthma   . Anxiety     Patient Active Problem List   Diagnosis Date Noted  . Seizure ([place]) 02/03/2015    Past Surgical History  Procedure Laterality Date  . Esophageal dilation      Current Outpatient Rx  Name  Route  Sig  Dispense  Refill  . acetaminophen (TYLENOL) 500 MG tablet   Oral   Take 500 mg by mouth every 8 (eight) hours as needed for mild pain.          [name] albuterol (PROVENTIL HFA;VENTOLIN HFA) 108 (90 BASE) MCG/ACT inhaler   Inhalation   Inhale 2 puffs into the lungs every 4 (four) hours as needed for wheezing or shortness of breath.         [name] atorvastatin (LIPITOR) 10 MG tablet   Oral   Take 10 mg by mouth at bedtime.          . cholecalciferol (VITAMIN D) 400 UNITS TABS tablet   Oral   Take 400 Units by mouth daily.          . clonazePAM (KLONOPIN) 0.5 MG tablet   Oral   Take 0.5 mg by mouth 3 (three) times daily.          . cyanocobalamin 500 MCG tablet   Oral   Take 500 mcg by mouth daily.         . DULoxetine (CYMBALTA) 60 MG capsule   Oral   Take 60 mg by mouth daily.         . ferrous sulfate 325 (65 FE) MG  tablet   Oral   Take 325 mg by mouth 3 (three) times daily with meals.         . folic acid (FOLVITE) 1 MG tablet   Oral   Take 1 mg by mouth daily.         [name] gabapentin (NEURONTIN) 100 MG capsule   Oral   Take 200 mg by mouth 2 (two) times daily.         [name] lacosamide 100 MG TABS   Oral   Take 1 tablet (100 mg total) by mouth 2 (two) times daily.   60 tablet   0   . lamoTRIgine (LAMICTAL) 100 MG tablet   Oral   Take 100 mg by mouth 2 (two) times daily. Pt takes with a 25mg  tablet.         . lamoTRIgine (LAMICTAL) 25 MG tablet   Oral   Take 25 mg by mouth 2 (two) times daily. Pt takes with a 100mg  tablet.         . levETIRAcetam (KEPPRA) 750 MG tablet  Oral   Take 750 mg by mouth 2 (two) times daily.         . mometasone-formoterol (DULERA) 100-5 MCG/ACT AERO   Inhalation   Inhale 2 puffs into the lungs 2 (two) times daily.         . ondansetron (ZOFRAN) 4 MG tablet   Oral   Take 4 mg by mouth every 4 (four) hours as needed for nausea or vomiting.          . pantoprazole (PROTONIX) 40 MG tablet   Oral   Take 40 mg by mouth daily before breakfast.          . phenytoin (DILANTIN) 100 MG ER capsule   Oral   Take 100 mg by mouth 2 (two) times daily.          Vladimir Faster Glycol-Propyl Glycol (SYSTANE) 0.4-0.3 % SOLN   Both Eyes   Place 2 drops into both eyes 2 (two) times daily.          . traMADol (ULTRAM) 50 MG tablet   Oral   Take 50 mg by mouth 2 (two) times daily as needed for moderate pain.         . cephALEXin (KEFLEX) 500 MG capsule   Oral   Take 1 capsule (500 mg total) by mouth 2 (two) times daily. Patient not taking: Reported on 10/12/2015   14 capsule   0   . levETIRAcetam (KEPPRA) 1000 MG tablet   Oral   Take 1 tablet (1,000 mg total) by mouth 2 (two) times daily. Patient not taking: Reported on 11/27/2015   60 tablet   0     Allergies Aspirin  No family history on file.  Social History Social History  Substance  Use Topics  . Smoking status: Current Every Day Smoker -- 0.50 packs/day    Types: Cigarettes  . Smokeless tobacco: Never Used  . Alcohol Use: No    Review of Systems  Constitutional: Negative for fever. Eyes: Negative for visual changes. ENT: Negative for sore throat. Cardiovascular: Negative for chest pain. Respiratory: Negative for shortness of breath. Gastrointestinal: Negative for abdominal pain, vomiting and diarrhea. Genitourinary: Negative for dysuria. Musculoskeletal: Negative for back pain. Skin: Negative for rash. Neurological: Positive for seizure   10-point ROS otherwise negative.  ____________________________________________   PHYSICAL EXAM:  VITAL SIGNS: ED Triage Vitals  Enc Vitals Group     BP 11/27/15 2243 125/64 mmHg     Pulse Rate 11/27/15 2243 61     Resp 11/27/15 2243 20     Temp 11/27/15 2243 98.7 F (37.1 C)     Temp Source 11/27/15 2243 Oral     SpO2 11/27/15 2243 99 %     Weight 11/27/15 2243 205 lb (92.987 kg)     Height 11/27/15 2243 5\' 4"  (1.626 m)     Head Cir --      Peak Flow --      Pain Score 11/27/15 2244 5     Pain Loc --      Pain Edu? --      Excl. in [place]? --      Constitutional: Alert and oriented. Well appearing and in no distress. Eyes: Conjunctivae are normal. PERRL. Normal extraocular movements. ENT   Head: Normocephalic and atraumatic.   Nose: No congestion/rhinnorhea.   Mouth/Throat: Mucous membranes are moist.   Neck: No stridor. Hematological/Lymphatic/Immunilogical: No cervical lymphadenopathy. Cardiovascular: Normal rate, regular rhythm. Normal and symmetric distal pulses are present in  all extremities. No murmurs, rubs, or gallops. Respiratory: Normal respiratory effort without tachypnea nor retractions. Breath sounds are clear and equal bilaterally. No wheezes/rales/rhonchi. Gastrointestinal: Soft and nontender. No distention. There is no CVA tenderness. Genitourinary: deferred Musculoskeletal:  Nontender with normal range of motion in all extremities. No joint effusions.  No lower extremity tenderness nor edema. Neurologic:  Normal speech and language. Baseline right facial droop  Skin:  Skin is warm, dry and intact. No rash noted. Psychiatric: Mood and affect are normal. Speech and behavior are normal. Patient exhibits appropriate insight and judgment.  ____________________________________________    LABS (pertinent positives/negatives)  Labs Reviewed  COMPREHENSIVE METABOLIC PANEL - Abnormal; Notable for the following:    Calcium 8.5 (*)    All other components within normal limits  CBC - Abnormal; Notable for the following:    RBC 3.40 (*)    Hemoglobin 11.7 (*)    HCT 34.7 (*)    MCV 102.2 (*)    MCH 34.4 (*)    RDW 15.1 (*)    All other components within normal limits  PHENYTOIN LEVEL, TOTAL         INITIAL IMPRESSION / ASSESSMENT AND PLAN / ED COURSE  Pertinent labs & imaging results that were available during my care of the patient were reviewed by me and considered in my medical decision making (see chart for details).  No further seizure activity observed in the emergency department. Patient received Keppra 1 g  ____________________________________________   FINAL CLINICAL IMPRESSION(S) / ED DIAGNOSES  Final diagnoses:  Seizure ([place])      Gregor Hams, MD 11/28/15 [phone]

## 2015-11-28 NOTE — ED Notes (Signed)
Report called to [place], [facility]/CNA at [facility]. Facility unable to pick up pt at discharge. EMS transport to be arranged.

## 2015-11-28 NOTE — ED Notes (Signed)

## 2015-11-28 NOTE — Discharge Instructions (Signed)

## 2015-11-29 LAB — URINE CULTURE: Culture: >=100000 — AB

## 2015-11-30 NOTE — Progress Notes (Signed)
ED Culture Results    Patient's urine cultures positive for 100k CFU of Ecoli. Spoke with MD Marta Antu and she would like to treat patient w/ Septra DS 1 tablet PO BID.   Contacted [facility], which is the patient's assisted nursing facility.   Tia, the receptionist stated to fax oer information to facility and she will get in contact with the pharmacy.  Information faxed to facility.   Fax # [phone]   Larene Beach, PharmD, BCPS

## 2015-12-14 ENCOUNTER — Encounter: Payer: Self-pay | Admitting: Emergency Medicine

## 2015-12-14 ENCOUNTER — Emergency Department
Admission: EM | Admit: 2015-12-14 | Discharge: 2015-12-15 | Disposition: A | Discharge status: Home / Self Care | Payer: Medicaid Other | Attending: Emergency Medicine | Admitting: Emergency Medicine

## 2015-12-14 DIAGNOSIS — R569 Unspecified convulsions: Secondary | ICD-10-CM | POA: Diagnosis present

## 2015-12-14 DIAGNOSIS — G40909 Epilepsy, unspecified, not intractable, without status epilepticus: Secondary | ICD-10-CM | POA: Insufficient documentation

## 2015-12-14 DIAGNOSIS — F17210 Nicotine dependence, cigarettes, uncomplicated: Secondary | ICD-10-CM | POA: Insufficient documentation

## 2015-12-14 DIAGNOSIS — Z79899 Other long term (current) drug therapy: Secondary | ICD-10-CM | POA: Insufficient documentation

## 2015-12-14 DIAGNOSIS — J45909 Unspecified asthma, uncomplicated: Secondary | ICD-10-CM | POA: Insufficient documentation

## 2015-12-14 DIAGNOSIS — J449 Chronic obstructive pulmonary disease, unspecified: Secondary | ICD-10-CM | POA: Diagnosis not present

## 2015-12-14 LAB — BASIC METABOLIC PANEL
Anion gap: 6 (ref 5–15)
BUN: 16 mg/dL (ref 6–20)
CHLORIDE: 104 mmol/L (ref 101–111)
CO2: 28 mmol/L (ref 22–32)
CREATININE: 0.69 mg/dL (ref 0.44–1.00)
Calcium: 8.9 mg/dL (ref 8.9–10.3)
Glucose, Bld: 90 mg/dL (ref 65–99)
POTASSIUM: 3.7 mmol/L (ref 3.5–5.1)
SODIUM: 138 mmol/L (ref 135–145)

## 2015-12-14 LAB — CBC
HCT: 35 % (ref 35.0–47.0)
Hemoglobin: 12.2 g/dL (ref 12.0–16.0)
MCH: 36 pg — ABNORMAL HIGH (ref 26.0–34.0)
MCHC: 35 g/dL (ref 32.0–36.0)
MCV: 102.8 fL — AB (ref 80.0–100.0)
PLATELETS: 182 10*3/uL (ref 150–440)
RBC: 3.41 MIL/uL — AB (ref 3.80–5.20)
RDW: 14.8 % — ABNORMAL HIGH (ref 11.5–14.5)
WBC: 4.8 10*3/uL (ref 3.6–11.0)

## 2015-12-14 NOTE — ED Notes (Signed)
Pt arrived via ems from [facility]. Pt had witnessed seizure that was described as a disconnect and tremors by bystander. Pt was disoriented and did not remember what happened. PT has history of seizures, last seizure June 17th, pt states being on a regular seizure medication regimen. Upon assessment pt AxO x 4.

## 2015-12-14 NOTE — ED Provider Notes (Signed)
[facility] Emergency Department Provider Note  ____________________________________________  Time seen: Approximately 11:51 PM  I have reviewed the triage vital signs and the nursing notes.   HISTORY  Chief Complaint Seizures  The patient has a history of traumatic brain injury many years ago  HPI Marcia Brown is a 60 y.o. female with a long history of traumatic brain injury and refractory seizures on multiple meds and who lives at a presents by EMS for evaluation of an apparent seizure-like episode.She has seizures as frequently as every other day.  She frequent comes to the emergency department after these episodes.  She had a brief episode that she does not remember that was reported as tremors by a bystander.  Initially she seemed postictal and was disoriented and confused but she is back to her baseline alert and oriented 4.  She states that her medication regimen was recently changed but she does not know any of the details; she thinks that the amount of medicine she takes was increased.  Denies any injuries and denies any pain at this time.  She did not bite her tongue and did not lose urinary continence.  She denies fever/chills, chest pain, shortness of breath, nausea, vomiting, diarrhea.  She denies dysuria but confirmed with me that multiple times in the past when she has had seizures she has been found to have a urinary tract infection.  The onset of the Symptoms was acute and similar to her prior episodes.  Seizure resolved without any intervention.   Past Medical History  Diagnosis Date  . COPD (chronic obstructive pulmonary disease) ([place])   . Seizures ([place])   . Sleep apnea   . High cholesterol   . Asthma   . Anxiety     Patient Active Problem List   Diagnosis Date Noted  . Seizure ([place]) 02/03/2015    Past Surgical History  Procedure Laterality Date  . Esophageal dilation      Current Outpatient Rx  Name  Route  Sig  Dispense  Refill   . acetaminophen (TYLENOL) 500 MG tablet   Oral   Take 500 mg by mouth every 8 (eight) hours as needed for mild pain.          [name] albuterol (PROVENTIL HFA;VENTOLIN HFA) 108 (90 BASE) MCG/ACT inhaler   Inhalation   Inhale 2 puffs into the lungs every 4 (four) hours as needed for wheezing or shortness of breath.         [name] atorvastatin (LIPITOR) 10 MG tablet   Oral   Take 10 mg by mouth at bedtime.          . cholecalciferol (VITAMIN D) 400 UNITS TABS tablet   Oral   Take 400 Units by mouth daily.          . clonazePAM (KLONOPIN) 0.5 MG tablet   Oral   Take 0.5 mg by mouth 3 (three) times daily.          . cyanocobalamin 500 MCG tablet   Oral   Take 500 mcg by mouth daily.         . DULoxetine (CYMBALTA) 60 MG capsule   Oral   Take 60 mg by mouth daily.         . ferrous sulfate 325 (65 FE) MG tablet   Oral   Take 325 mg by mouth 3 (three) times daily with meals.         . folic acid (FOLVITE) 1 MG tablet  Oral   Take 1 mg by mouth daily.         [name] gabapentin (NEURONTIN) 100 MG capsule   Oral   Take 200 mg by mouth 2 (two) times daily.         [name] lacosamide 100 MG TABS   Oral   Take 1 tablet (100 mg total) by mouth 2 (two) times daily.   60 tablet   0   . lamoTRIgine (LAMICTAL) 100 MG tablet   Oral   Take 100 mg by mouth 2 (two) times daily. Pt takes with a 25mg  tablet.         . lamoTRIgine (LAMICTAL) 25 MG tablet   Oral   Take 25 mg by mouth 2 (two) times daily. Pt takes with a 100mg  tablet.         . levETIRAcetam (KEPPRA) 1000 MG tablet   Oral   Take 1 tablet (1,000 mg total) by mouth 2 (two) times daily.   60 tablet   0   . mometasone-formoterol (DULERA) 100-5 MCG/ACT AERO   Inhalation   Inhale 2 puffs into the lungs 2 (two) times daily.         . ondansetron (ZOFRAN) 4 MG tablet   Oral   Take 4 mg by mouth every 4 (four) hours as needed for nausea or vomiting.          . pantoprazole (PROTONIX) 40 MG tablet   Oral    Take 40 mg by mouth daily before breakfast.          . phenytoin (DILANTIN) 100 MG ER capsule   Oral   Take 100 mg by mouth 2 (two) times daily.          Vladimir Faster Glycol-Propyl Glycol (SYSTANE) 0.4-0.3 % SOLN   Both Eyes   Place 2 drops into both eyes 2 (two) times daily.          . traMADol (ULTRAM) 50 MG tablet   Oral   Take 50 mg by mouth 2 (two) times daily as needed for moderate pain.           Allergies Aspirin  No family history on file.  Social History Social History  Substance Use Topics  . Smoking status: Current Every Day Smoker -- 0.50 packs/day    Types: Cigarettes  . Smokeless tobacco: Never Used  . Alcohol Use: No    Review of Systems Constitutional: No fever/chills Eyes: No visual changes. ENT: No sore throat. Cardiovascular: Denies chest pain. Respiratory: Denies shortness of breath. Gastrointestinal: No abdominal pain.  No nausea, no vomiting.  No diarrhea.  No constipation. Genitourinary: Negative for dysuria. Musculoskeletal: Negative for back pain. Skin: Negative for rash. Neurological: Negative for headaches, focal weakness or numbness.  Brief seizure-like episode at her living facility.  10-point ROS otherwise negative.  ____________________________________________   PHYSICAL EXAM:  VITAL SIGNS: ED Triage Vitals  Enc Vitals Group     BP 12/14/15 2248 152/81 mmHg     Pulse Rate 12/14/15 2248 62     Resp 12/14/15 2248 17     Temp 12/14/15 2248 98.8 F (37.1 C)     Temp Source 12/14/15 2248 Oral     SpO2 12/14/15 2248 95 %     Weight 12/14/15 2248 209 lb 12.8 oz (95.165 kg)     Height 12/14/15 2248 5\' 4"  (1.626 m)     Head Cir --      Peak Flow --  Pain Score 12/14/15 2258 0     Pain Loc --      Pain Edu? --      Excl. in [place]? --     Constitutional: Alert and oriented. Well appearing and in no acute distress. Eyes: Conjunctivae are normal. PERRL. EOMI. Head: Atraumatic. Nose: No  congestion/rhinnorhea. Mouth/Throat: Mucous membranes are moist.  Oropharynx non-erythematous. Neck: No stridor.  No meningeal signs.   Cardiovascular: Normal rate, regular rhythm. Good peripheral circulation. Grossly normal heart sounds.   Respiratory: Normal respiratory effort.  No retractions. Lungs CTAB. Gastrointestinal: Soft and nontender. No distention.  Musculoskeletal: No lower extremity tenderness nor edema. No gross deformities of extremities. Neurologic:  Normal speech and language. No gross focal neurologic deficits are appreciated.  Skin:  Skin is warm, dry and intact. No rash noted. Psychiatric: Mood and affect are normal. Speech and behavior are normal.  ____________________________________________   LABS (all labs ordered are listed, but only abnormal results are displayed)  Labs Reviewed  CBC - Abnormal; Notable for the following:    RBC 3.41 (*)    MCV 102.8 (*)    MCH 36.0 (*)    RDW 14.8 (*)    All other components within normal limits  URINALYSIS COMPLETEWITH MICROSCOPIC ([facility] ONLY) - Abnormal; Notable for the following:    Color, Urine COLORLESS (*)    APPearance CLEAR (*)    Specific Gravity, Urine 1.004 (*)    Hgb urine dipstick 2+ (*)    All other components within normal limits  URINE CULTURE  BASIC METABOLIC PANEL  LAMOTRIGINE LEVEL   ____________________________________________  EKG  None ____________________________________________  RADIOLOGY   No results found.  ____________________________________________   PROCEDURES  Procedure(s) performed:   Procedures   ____________________________________________   INITIAL IMPRESSION / ASSESSMENT AND PLAN / ED COURSE  Pertinent labs & imaging results that were available during my care of the patient were reviewed by me and considered in my medical decision making (see chart for details).  No indication for imaging at this time.  Labs are unremarkable.  Urine Normal.  Patient has been  stable with no acute distress and no additional episodes.  We will discharge back to facility.  No indication for changes in medications.   ____________________________________________  FINAL CLINICAL IMPRESSION(S) / ED DIAGNOSES  Final diagnoses:  Seizure ([place])     MEDICATIONS GIVEN DURING THIS VISIT:  Medications - No data to display   NEW OUTPATIENT MEDICATIONS STARTED DURING THIS VISIT:  New Prescriptions   No medications on file      Note:  This document was prepared using Dragon voice recognition software and may include unintentional dictation errors.   Hinda Kehr, MD 12/15/15 [phone]

## 2015-12-15 LAB — URINALYSIS COMPLETE WITH MICROSCOPIC (ARMC ONLY)
BACTERIA UA: NONE SEEN
BILIRUBIN URINE: NEGATIVE
Glucose, UA: NEGATIVE mg/dL
Ketones, ur: NEGATIVE mg/dL
LEUKOCYTES UA: NEGATIVE
Nitrite: NEGATIVE
PH: 7 (ref 5.0–8.0)
PROTEIN: NEGATIVE mg/dL
SQUAMOUS EPITHELIAL / LPF: NONE SEEN
Specific Gravity, Urine: 1.004 — ABNORMAL LOW (ref 1.005–1.030)

## 2015-12-15 NOTE — ED Notes (Signed)
Pt asleep on stretcher, warm blanket covering pt. Snoring noted. Lights dimmed for comfort.

## 2015-12-15 NOTE — ED Notes (Signed)
Pt sleeping on stretcher with head of the bed up. Blanket covering pt. Light snoring noted at this time.

## 2015-12-15 NOTE — Discharge Instructions (Signed)
You have been seen in the emergency department today for a seizure.  Your workup today including labs are within normal limits.  Please follow up with your doctor/neurologist as soon as possible regarding today's emergency department visit and your likely seizure.  As we have discussed it is very important that you do not drive until you have been seen and cleared by your neurologist.  Please drink plenty of fluids, get plenty of sleep and avoid any alcohol or drug use Please return to the emergency department if you have any further seizures which do not respond to medications, or for any other symptoms per se concerning for yourself.   Seizure, Adult A seizure is abnormal electrical activity in the brain. Seizures usually last from 30 seconds to 2 minutes. There are various types of seizures. Before a seizure, you may have a warning sensation (aura) that a seizure is about to occur. An aura may include the following symptoms:   Fear or anxiety.  Nausea.  Feeling like the room is spinning (vertigo).  Vision changes, such as seeing flashing lights or spots. Common symptoms during a seizure include:  A change in attention or behavior (altered mental status).  Convulsions with rhythmic jerking movements.  Drooling.  Rapid eye movements.  Grunting.  Loss of bladder and bowel control.  Bitter taste in the mouth.  Tongue biting. After a seizure, you may feel confused and sleepy. You may also have an injury resulting from convulsions during the seizure. HOME CARE INSTRUCTIONS   If you are given medicines, take them exactly as prescribed by your health care provider.  Keep all follow-up appointments as directed by your health care provider.  Do not swim or drive or engage in risky activity during which a seizure could cause further injury to you or others until your health care provider says it is OK.  Get adequate rest.  Teach friends and family what to do if you have a  seizure. They should:  Lay you on the ground to prevent a fall.  Put a cushion under your head.  Loosen any tight clothing around your neck.  Turn you on your side. If vomiting occurs, this helps keep your airway clear.  Stay with you until you recover.  Know whether or not you need emergency care. SEEK IMMEDIATE MEDICAL CARE IF:  The seizure lasts longer than 5 minutes.  The seizure is severe or you do not wake up immediately after the seizure.  You have an altered mental status after the seizure.  You are having more frequent or worsening seizures. Someone should drive you to the emergency department or call local emergency services (911 in [place] MAKE SURE YOU:  Understand these instructions.  Will watch your condition.  Will get help right away if you are not doing well or get worse.   This information is not intended to replace advice given to you by your health care provider. Make sure you discuss any questions you have with your health care provider.   Document Released: 05/25/2000 Document Revised: 06/18/2014 Document Reviewed: 01/07/2013 Elsevier Interactive Patient Education [employer].

## 2015-12-16 LAB — URINE CULTURE: Special Requests: NORMAL

## 2015-12-17 LAB — LAMOTRIGINE LEVEL: Lamotrigine Lvl: 3.1 ug/mL (ref 2.0–20.0)

## 2015-12-23 ENCOUNTER — Emergency Department: Payer: Medicaid Other

## 2015-12-23 ENCOUNTER — Emergency Department
Admission: EM | Admit: 2015-12-23 | Discharge: 2015-12-23 | Disposition: A | Discharge status: Home / Self Care | Payer: Medicaid Other | Attending: Emergency Medicine | Admitting: Emergency Medicine

## 2015-12-23 ENCOUNTER — Encounter: Payer: Self-pay | Admitting: Emergency Medicine

## 2015-12-23 DIAGNOSIS — S7002XA Contusion of left hip, initial encounter: Secondary | ICD-10-CM | POA: Diagnosis not present

## 2015-12-23 DIAGNOSIS — M25562 Pain in left knee: Secondary | ICD-10-CM | POA: Diagnosis present

## 2015-12-23 DIAGNOSIS — Y929 Unspecified place or not applicable: Secondary | ICD-10-CM | POA: Diagnosis not present

## 2015-12-23 DIAGNOSIS — W2201XA Walked into wall, initial encounter: Secondary | ICD-10-CM | POA: Insufficient documentation

## 2015-12-23 DIAGNOSIS — Y999 Unspecified external cause status: Secondary | ICD-10-CM | POA: Insufficient documentation

## 2015-12-23 DIAGNOSIS — S8002XA Contusion of left knee, initial encounter: Secondary | ICD-10-CM | POA: Diagnosis not present

## 2015-12-23 DIAGNOSIS — Z8673 Personal history of transient ischemic attack (TIA), and cerebral infarction without residual deficits: Secondary | ICD-10-CM | POA: Diagnosis not present

## 2015-12-23 DIAGNOSIS — Z79899 Other long term (current) drug therapy: Secondary | ICD-10-CM | POA: Insufficient documentation

## 2015-12-23 DIAGNOSIS — J45909 Unspecified asthma, uncomplicated: Secondary | ICD-10-CM | POA: Diagnosis not present

## 2015-12-23 DIAGNOSIS — J449 Chronic obstructive pulmonary disease, unspecified: Secondary | ICD-10-CM | POA: Insufficient documentation

## 2015-12-23 DIAGNOSIS — F17210 Nicotine dependence, cigarettes, uncomplicated: Secondary | ICD-10-CM | POA: Insufficient documentation

## 2015-12-23 DIAGNOSIS — W1809XA Striking against other object with subsequent fall, initial encounter: Secondary | ICD-10-CM

## 2015-12-23 DIAGNOSIS — S7012XA Contusion of left thigh, initial encounter: Secondary | ICD-10-CM | POA: Diagnosis not present

## 2015-12-23 DIAGNOSIS — Y9389 Activity, other specified: Secondary | ICD-10-CM | POA: Insufficient documentation

## 2015-12-23 MED ORDER — MELOXICAM 15 MG PO TABS: 15.0000 mg | ORAL_TABLET | Freq: Every day | ORAL | Status: DC

## 2015-12-23 NOTE — ED Notes (Signed)
EMS called to transport patient back to [facility]

## 2015-12-23 NOTE — ED Notes (Signed)
Called EMS to check on status of patient being transported back to [facility]. Per EMS, they have patient on list to be transported but have not had a truck to transport her back.

## 2015-12-23 NOTE — ED Notes (Addendum)
Patient to ER for c/o fall. States her leg came out from under her, denies any dizziness. Patient now c/o pain to left hip and left leg. Patient resides at [facility].

## 2015-12-23 NOTE — Discharge Instructions (Signed)

## 2015-12-23 NOTE — ED Notes (Signed)
Left message for Marcia Brown to call back to see if she is able to transport patient back to [facility]

## 2015-12-23 NOTE — ED Provider Notes (Signed)
[facility] Emergency Department Provider Note  ____________________________________________  Time seen: Approximately 4:08 PM  I have reviewed the triage vital signs and the nursing notes.   HISTORY  Chief Complaint Fall    HPI Marcia Brown is a 60 y.o. female who presents to emergency department for complaint of fall striking the left side of her body against the wall. Patient states that she was sitting in a chair when the legs gave away causing her to fall onto the wall. Patient remembers the entire event. She reports that she did hit her head but did not lose consciousness. Patient has a very significant history for seizure disorder but states that she did not have a seizure either precipitating or status post this event. Patient denies any headache, visual changes, neck pain. Patient reports sharp left hip and left knee pain. She denies any numbness or tingling in any extremity. Patient denies any chest pain, shortness of breath, abdominal pain, nausea or vomiting. She denies any other complaints. Patient is not taking any medication for this complaint but has taken her daily medications.   Past Medical History  Diagnosis Date  . COPD (chronic obstructive pulmonary disease) ([place])   . Seizures ([place])   . Sleep apnea   . High cholesterol   . Asthma   . Anxiety     Patient Active Problem List   Diagnosis Date Noted  . Seizure ([place]) 02/03/2015    Past Surgical History  Procedure Laterality Date  . Esophageal dilation      Current Outpatient Rx  Name  Route  Sig  Dispense  Refill  . acetaminophen (TYLENOL) 500 MG tablet   Oral   Take 500 mg by mouth every 8 (eight) hours as needed for mild pain.          [name] albuterol (PROVENTIL HFA;VENTOLIN HFA) 108 (90 BASE) MCG/ACT inhaler   Inhalation   Inhale 2 puffs into the lungs every 4 (four) hours as needed for wheezing or shortness of breath.         [name] atorvastatin (LIPITOR) 10 MG tablet   Oral    Take 10 mg by mouth at bedtime.          . cholecalciferol (VITAMIN D) 400 UNITS TABS tablet   Oral   Take 400 Units by mouth daily.          . clonazePAM (KLONOPIN) 0.5 MG tablet   Oral   Take 0.5 mg by mouth 3 (three) times daily.          . cyanocobalamin 500 MCG tablet   Oral   Take 500 mcg by mouth daily.         . DULoxetine (CYMBALTA) 60 MG capsule   Oral   Take 60 mg by mouth daily.         . ferrous sulfate 325 (65 FE) MG tablet   Oral   Take 325 mg by mouth 3 (three) times daily with meals.         . folic acid (FOLVITE) 1 MG tablet   Oral   Take 1 mg by mouth daily.         [name] gabapentin (NEURONTIN) 100 MG capsule   Oral   Take 200 mg by mouth 2 (two) times daily.         [name] lacosamide 100 MG TABS   Oral   Take 1 tablet (100 mg total) by mouth 2 (two) times daily.   60 tablet  0   . lamoTRIgine (LAMICTAL) 100 MG tablet   Oral   Take 100 mg by mouth 2 (two) times daily. Pt takes with a 25mg  tablet.         . lamoTRIgine (LAMICTAL) 25 MG tablet   Oral   Take 25 mg by mouth 2 (two) times daily. Pt takes with a 100mg  tablet.         . levETIRAcetam (KEPPRA) 1000 MG tablet   Oral   Take 1 tablet (1,000 mg total) by mouth 2 (two) times daily.   60 tablet   0   . meloxicam (MOBIC) 15 MG tablet   Oral   Take 1 tablet (15 mg total) by mouth daily.   30 tablet   0   . mometasone-formoterol (DULERA) 100-5 MCG/ACT AERO   Inhalation   Inhale 2 puffs into the lungs 2 (two) times daily.         . ondansetron (ZOFRAN) 4 MG tablet   Oral   Take 4 mg by mouth every 4 (four) hours as needed for nausea or vomiting.          . pantoprazole (PROTONIX) 40 MG tablet   Oral   Take 40 mg by mouth daily before breakfast.          . phenytoin (DILANTIN) 100 MG ER capsule   Oral   Take 100 mg by mouth 2 (two) times daily.          Vladimir Faster Glycol-Propyl Glycol (SYSTANE) 0.4-0.3 % SOLN   Both Eyes   Place 2 drops into both eyes 2  (two) times daily.          . traMADol (ULTRAM) 50 MG tablet   Oral   Take 50 mg by mouth 2 (two) times daily as needed for moderate pain.           Allergies Aspirin  No family history on file.  Social History Social History  Substance Use Topics  . Smoking status: Current Every Day Smoker -- 0.50 packs/day    Types: Cigarettes  . Smokeless tobacco: Never Used  . Alcohol Use: No     Review of Systems  Constitutional: No fever/chills Eyes: No visual changes.  Cardiovascular: no chest pain. Respiratory: no cough. No SOB. Gastrointestinal: No abdominal pain.  No nausea, no vomiting.   Musculoskeletal:Positive for left hip and left knee pain. Skin: Negative for rash, abrasions, lacerations, ecchymosis. Neurological: Negative for headaches, focal weakness or numbness. 10-point ROS otherwise negative.  ____________________________________________   PHYSICAL EXAM:  VITAL SIGNS: ED Triage Vitals  Enc Vitals Group     BP 12/23/15 1507 119/82 mmHg     Pulse Rate 12/23/15 1507 57     Resp 12/23/15 1507 18     Temp 12/23/15 1507 98.1 F (36.7 C)     Temp Source 12/23/15 1507 Oral     SpO2 12/23/15 1507 95 %     Weight 12/23/15 1507 203 lb (92.08 kg)     Height 12/23/15 1507 5\' 1"  (1.549 m)     Head Cir --      Peak Flow --      Pain Score 12/23/15 1508 10     Pain Loc --      Pain Edu? --      Excl. in [place]? --      Constitutional: Alert and oriented. Well appearing and in no acute distress. Eyes: Conjunctivae are normal. PERRL. EOMI. Head: Atraumatic.No visible signs trauma. No  ecchymosis, contusions, abrasions, lacerations. Patient is nontender to palpation over the osseous structures of the skull. No crepitus noted. No bowel signs. No raccoon eyes. Nose erythematous fluid drainage from the ears or nares. ENT:      Ears:       Nose: No congestion/rhinnorhea.      Mouth/Throat: Mucous membranes are moist.  Neck: No stridor. Neck is supple with full range of  motion  Cardiovascular: Normal rate, regular rhythm. Normal S1 and S2.  Good peripheral circulation. Respiratory: Normal respiratory effort without tachypnea or retractions. Lungs CTAB. Good air entry to the bases with no decreased or absent breath sounds. Musculoskeletal: Full range of motion to all extremities. No gross deformities appreciated. Patient is tender to palpation over the lateral aspect of the left hip and left knee. No palpable abnormality. Full range of motion to both joints. Varus, valgus, Lachman's negative. Dorsalis pedis pulse is appreciated bilateral lower extremities. Sensation intact and equal lower extremities. Neurologic:  Normal speech and language. No gross focal neurologic deficits are appreciated.  Skin:  Skin is warm, dry and intact. No rash noted. Psychiatric: Mood and affect are normal. Speech and behavior are normal. Patient exhibits appropriate insight and judgement.   ____________________________________________   LABS (all labs ordered are listed, but only abnormal results are displayed)  Labs Reviewed - No data to display ____________________________________________  EKG   ____________________________________________  RADIOLOGY Diamantina Providence Darrly Loberg, personally viewed and evaluated these images (plain radiographs) as part of my medical decision making, as well as reviewing the written report by the radiologist.  Dg Tibia/fibula Left  12/23/2015  CLINICAL DATA:  Fall.  Lower left leg pain. EXAM: LEFT TIBIA AND FIBULA - 2 VIEW COMPARISON:  None. FINDINGS: Mild degenerative changes in the left knee with joint space narrowing and spurring. No acute bony abnormality. Specifically, no fracture, subluxation, or dislocation. Soft tissues are intact. IMPRESSION: No acute bony abnormality. Electronically Signed   By: Rolm Baptise M.D.   On: 12/23/2015 15:40   Dg Hip Unilat With Pelvis 2-3 Views Left  12/23/2015  CLINICAL DATA:  Acute left hip pain after fall  today. EXAM: DG HIP (WITH OR WITHOUT PELVIS) 2-3V LEFT COMPARISON:  None. FINDINGS: There is no evidence of hip fracture or dislocation. There is no evidence of arthropathy or other focal bone abnormality. IMPRESSION: Normal left hip. Electronically Signed   By: Marijo Conception, M.D.   On: 12/23/2015 15:41    ____________________________________________    PROCEDURES  Procedure(s) performed:       Medications - No data to display   ____________________________________________   INITIAL IMPRESSION / ASSESSMENT AND PLAN / ED COURSE  Pertinent labs & imaging results that were available during my care of the patient were reviewed by me and considered in my medical decision making (see chart for details).  Patient's diagnosis is consistent with fall resulting in left hip and left knee contusion. Patient's exam is reassuring at this time. No indications or seizures from this event. Patient is neurologically intact... Patient will be discharged home with prescriptions for symptomatic control medication. Patient is to follow up with primary care provider as needed or otherwise directed. Patient is given ED precautions to return to the ED for any worsening or new symptoms.     ____________________________________________  FINAL CLINICAL IMPRESSION(S) / ED DIAGNOSES  Final diagnoses:  Fall against object, initial encounter  Contusion of left hip and thigh, initial encounter  Knee contusion, left, initial encounter  NEW MEDICATIONS STARTED DURING THIS VISIT:  New Prescriptions   MELOXICAM (MOBIC) 15 MG TABLET    Take 1 tablet (15 mg total) by mouth daily.        This chart was dictated using voice recognition software/Dragon. Despite best efforts to proofread, errors can occur which can change the meaning. Any change was purely unintentional.    Darletta Moll, PA-C 12/23/15 1619  Lisa Roca, MD 12/23/15 2156

## 2015-12-23 NOTE — ED Notes (Signed)
Called and spoke with Francie Massing at [facility], their transportation is gone for the day and they are unable to transport patient back to [facility]

## 2015-12-27 DIAGNOSIS — Z720 Tobacco use: Secondary | ICD-10-CM | POA: Diagnosis present

## 2016-01-24 ENCOUNTER — Emergency Department
Admission: EM | Admit: 2016-01-24 | Discharge: 2016-01-25 | Disposition: A | Discharge status: Home / Self Care | Payer: Medicaid Other | Attending: Emergency Medicine | Admitting: Emergency Medicine

## 2016-01-24 DIAGNOSIS — F17210 Nicotine dependence, cigarettes, uncomplicated: Secondary | ICD-10-CM | POA: Diagnosis not present

## 2016-01-24 DIAGNOSIS — G40909 Epilepsy, unspecified, not intractable, without status epilepticus: Secondary | ICD-10-CM | POA: Insufficient documentation

## 2016-01-24 DIAGNOSIS — Z79899 Other long term (current) drug therapy: Secondary | ICD-10-CM | POA: Diagnosis not present

## 2016-01-24 DIAGNOSIS — J449 Chronic obstructive pulmonary disease, unspecified: Secondary | ICD-10-CM | POA: Diagnosis not present

## 2016-01-24 DIAGNOSIS — J45909 Unspecified asthma, uncomplicated: Secondary | ICD-10-CM | POA: Diagnosis not present

## 2016-01-24 DIAGNOSIS — R569 Unspecified convulsions: Secondary | ICD-10-CM

## 2016-01-24 LAB — CBC
HEMATOCRIT: 40.4 % (ref 35.0–47.0)
HEMOGLOBIN: 13.8 g/dL (ref 12.0–16.0)
MCH: 34.2 pg — ABNORMAL HIGH (ref 26.0–34.0)
MCHC: 34.1 g/dL (ref 32.0–36.0)
MCV: 100.4 fL — AB (ref 80.0–100.0)
Platelets: 161 10*3/uL (ref 150–440)
RBC: 4.03 MIL/uL (ref 3.80–5.20)
RDW: 13.7 % (ref 11.5–14.5)
WBC: 4.8 10*3/uL (ref 3.6–11.0)

## 2016-01-24 LAB — BASIC METABOLIC PANEL
ANION GAP: 9 (ref 5–15)
BUN: 12 mg/dL (ref 6–20)
CALCIUM: 9.1 mg/dL (ref 8.9–10.3)
CHLORIDE: 106 mmol/L (ref 101–111)
CO2: 24 mmol/L (ref 22–32)
CREATININE: 0.81 mg/dL (ref 0.44–1.00)
GFR calc non Af Amer: >60 mL/min (ref >60)
GLUCOSE: 91 mg/dL (ref 65–99)
Potassium: 3.8 mmol/L (ref 3.5–5.1)
Sodium: 139 mmol/L (ref 135–145)

## 2016-01-24 LAB — PHENYTOIN LEVEL, TOTAL: Phenytoin Lvl: 13 ug/mL (ref 10.0–20.0)

## 2016-01-24 NOTE — ED Notes (Signed)
Pt ambulatory with assistance to toilet.

## 2016-01-24 NOTE — ED Notes (Signed)
Pt resting on stretcher, lights dimmed for comfort. Seizure pads on side rails. Call light near right hand.

## 2016-01-24 NOTE — ED Triage Notes (Signed)
Pt presents from the [facility] via [place]. 1 hr PTA pt had a seizure, was post ictal for 30-45 minutes per EMS. Pt was confused upon arrival, did not know name, but is now more alert and oriented.

## 2016-01-24 NOTE — Discharge Instructions (Signed)
Please return immediately if condition worsens. Please contact her primary physician or the physician you were given for referral. If you have any specialist physicians involved in her treatment and plan please also contact them. Thank you for using [facility] [facility] emergency Department.  Patient has a history of seizures, she will have another seizure. She does not require transport unless her seizures last more than 15 minutes, more than 1 hour period of recovery, or a second seizure while still recovering from the first. She should also be transported for any injury from a seizure or physical complaint. Please contact her primary physician for any adjustments on her medication.

## 2016-01-24 NOTE — ED Notes (Signed)
Called lab to add-on phenytoin level. They said they would make sure they have blood tube for test and run it.

## 2016-01-25 NOTE — ED Provider Notes (Signed)
Time Seen: Approximately 2204  I have reviewed the triage notes  Chief Complaint: Seizures   History of Present Illness: Marcia Brown is a 60 y.o. female *who presents from local nursing facility for a seizure. Seizure unknown length of time but seemed to be less than 10 minutes. Patient had a postictal period of 30-45 minutes per EMS. Patient seems to be improving she was slightly confused upon arrival but time of my evaluation she appears back to baseline. She has a long history of seizures and is been seen here on multiple occasions for the same. She denies any headaches or focal weakness. She denies any shoulder pain. She did not bite her tongue or urinate on herself. She continues to be on multiple antiepileptic medications.   Past Medical History:  Diagnosis Date  . Anxiety   . Asthma   . COPD (chronic obstructive pulmonary disease) ([place])   . High cholesterol   . Seizures ([place])   . Sleep apnea     Patient Active Problem List   Diagnosis Date Noted  . Seizure ([place]) 02/03/2015    Past Surgical History:  Procedure Laterality Date  . ESOPHAGEAL DILATION      Past Surgical History:  Procedure Laterality Date  . ESOPHAGEAL DILATION      Current Outpatient Rx  . Order #: [id number] Class: Historical Med  . Order #: [id number] Class: Historical Med  . Order #: [id number] Class: Historical Med  . Order #: [id number] Class: Historical Med  . Order #: [id number] Class: Historical Med  . Order #: [id number] Class: Historical Med  . Order #: [id number] Class: Historical Med  . Order #: [id number] Class: Historical Med  . Order #: [id number] Class: Historical Med  . Order #: [id number] Class: Historical Med  . Order #: [id number] Class: Print  . Order #: [id number] Class: Historical Med  . Order #: [id number] Class: Print  . Order #: [id number] Class: Print  . Order #: [id number] Class: Historical Med  . Order #: [id number] Class: Historical Med  . Order #: [id number] Class: Historical Med  . Order #:  [id number] Class: Historical Med  . Order #: [id number] Class: Historical Med  . Order #: [id number] Class: Historical Med    Allergies:  Aspirin  Family History: History reviewed. No pertinent family history.  Social History: Social History  Substance Use Topics  . Smoking status: Current Every Day Smoker    Packs/day: 0.50    Types: Cigarettes  . Smokeless tobacco: Never Used  . Alcohol use No     Review of Systems:   10 point review of systems was performed and was otherwise negative:  Constitutional: No fever Eyes: No visual disturbances ENT: No sore throat, ear pain Cardiac: No chest pain Respiratory: No shortness of breath, wheezing, or stridor Abdomen: No abdominal pain, no vomiting, No diarrhea Endocrine: No weight loss, No night sweats Extremities: No peripheral edema, cyanosis Skin: No rashes, easy bruising Neurologic: No focal weakness, trouble with speech or swollowing Urologic: No dysuria, Hematuria, or urinary frequency   Physical Exam:  ED Triage Vitals  Enc Vitals Group     BP 01/24/16 2118 (!) 148/85     Pulse Rate 01/24/16 2118 (!) 57     Resp 01/24/16 2118 20     Temp 01/24/16 2118 98.7 F (37.1 C)     Temp Source 01/24/16 2118 Oral     SpO2 01/24/16 2118 94 %     Weight 01/24/16 2119 205 lb 14.6 oz (93.4 kg)     Height 01/24/16 2119 5\' 3"  (1.6 m)  Head Circumference --      Peak Flow --      Pain Score 01/24/16 2119 10     Pain Loc --      Pain Edu? --      Excl. in [place]? --     General: Awake , Alert , and Oriented times 3; GCS 15 Head: Normal cephalic , atraumatic Eyes: Pupils equal , round, reactive to light Nose/Throat: No nasal drainage, patent upper airway without erythema or exudate.  Neck: Supple, Full range of motion, No anterior adenopathy or palpable thyroid masses Lungs: Clear to ascultation without wheezes , rhonchi, or rales Heart: Regular rate, regular rhythm without murmurs , gallops , or rubs Abdomen: Soft, non tender  without rebound, guarding , or rigidity; bowel sounds positive and symmetric in all 4 quadrants. No organomegaly .        Extremities: 2 plus symmetric pulses. No edema, clubbing or cyanosis Neurologic: normal ambulation, Motor symmetric without deficits, sensory intact Skin: warm, dry, no rashes   Labs:   All laboratory work was reviewed including any pertinent negatives or positives listed below:  Labs Reviewed  CBC - Abnormal; Notable for the following:       Result Value   MCV 100.4 (*)    MCH 34.2 (*)    All other components within normal limits  BASIC METABOLIC PANEL  PHENYTOIN LEVEL, TOTAL  Laboratory work was reviewed and showed no clinically significant abnormalities.   EKG:  ED ECG REPORT I, Daymon Larsen, the attending physician, personally viewed and interpreted this ECG.  Date: 01/25/2016 EKG Time: 2123 Rate: 58 Rhythm: normal sinus rhythm QRS Axis: normal Intervals: normal ST/T Wave abnormalities: normal Conduction Disturbances: none Narrative Interpretation: unremarkable    ED Course:  Patient's stay here was uneventful and she'll be transported back to the nursing facility. Adjustments of her medication can be made as an outpatient. He shouldn't feels comfortable with that decision did not want any additional medications at this time.   Clinical Course    Final Clinical Impression:   Final diagnoses:  Seizure [facility])  Seizure disorder [facility])     Plan:  Outpatient Continue current medications Patient was advised to return immediately if condition worsens. Patient was advised to follow up with their primary care physician or other specialized physicians involved in their outpatient care. The patient and/or family member/power of attorney had laboratory results reviewed at the bedside. All questions and concerns were addressed and appropriate discharge instructions were distributed by the nursing staff.            Daymon Larsen,  MD 01/25/16 [phone]

## 2016-02-25 ENCOUNTER — Encounter: Payer: Self-pay | Admitting: Emergency Medicine

## 2016-02-25 ENCOUNTER — Emergency Department: Payer: Medicaid Other

## 2016-02-25 ENCOUNTER — Emergency Department
Admission: EM | Admit: 2016-02-25 | Discharge: 2016-02-25 | Disposition: A | Discharge status: Home / Self Care | Payer: Medicaid Other | Attending: Student in an Organized Health Care Education/Training Program | Admitting: Student in an Organized Health Care Education/Training Program

## 2016-02-25 DIAGNOSIS — Z79899 Other long term (current) drug therapy: Secondary | ICD-10-CM | POA: Diagnosis not present

## 2016-02-25 DIAGNOSIS — J449 Chronic obstructive pulmonary disease, unspecified: Secondary | ICD-10-CM | POA: Insufficient documentation

## 2016-02-25 DIAGNOSIS — F17210 Nicotine dependence, cigarettes, uncomplicated: Secondary | ICD-10-CM | POA: Diagnosis not present

## 2016-02-25 DIAGNOSIS — J45909 Unspecified asthma, uncomplicated: Secondary | ICD-10-CM | POA: Insufficient documentation

## 2016-02-25 DIAGNOSIS — Z791 Long term (current) use of non-steroidal anti-inflammatories (NSAID): Secondary | ICD-10-CM | POA: Diagnosis not present

## 2016-02-25 DIAGNOSIS — G40909 Epilepsy, unspecified, not intractable, without status epilepticus: Secondary | ICD-10-CM | POA: Diagnosis not present

## 2016-02-25 DIAGNOSIS — M25562 Pain in left knee: Secondary | ICD-10-CM | POA: Insufficient documentation

## 2016-02-25 DIAGNOSIS — R569 Unspecified convulsions: Secondary | ICD-10-CM

## 2016-02-25 DIAGNOSIS — I639 Cerebral infarction, unspecified: Secondary | ICD-10-CM | POA: Diagnosis not present

## 2016-02-25 LAB — URINE DRUG SCREEN, QUALITATIVE (ARMC ONLY)
AMPHETAMINES, UR SCREEN: NOT DETECTED
Barbiturates, Ur Screen: NOT DETECTED
Benzodiazepine, Ur Scrn: NOT DETECTED
Cannabinoid 50 Ng, Ur ~~LOC~~: NOT DETECTED
Cocaine Metabolite,Ur ~~LOC~~: NOT DETECTED
MDMA (ECSTASY) UR SCREEN: NOT DETECTED
METHADONE SCREEN, URINE: NOT DETECTED
Opiate, Ur Screen: NOT DETECTED
Phencyclidine (PCP) Ur S: NOT DETECTED
TRICYCLIC, UR SCREEN: NOT DETECTED

## 2016-02-25 LAB — COMPREHENSIVE METABOLIC PANEL
ALT: 23 U/L (ref 14–54)
ANION GAP: 8 (ref 5–15)
AST: 28 U/L (ref 15–41)
Albumin: 4.1 g/dL (ref 3.5–5.0)
Alkaline Phosphatase: 65 U/L (ref 38–126)
BUN: 13 mg/dL (ref 6–20)
CHLORIDE: 104 mmol/L (ref 101–111)
CO2: 25 mmol/L (ref 22–32)
Calcium: 8.9 mg/dL (ref 8.9–10.3)
Creatinine, Ser: 1.09 mg/dL — ABNORMAL HIGH (ref 0.44–1.00)
GFR, EST NON AFRICAN AMERICAN: 53 mL/min — AB (ref >60)
Glucose, Bld: 143 mg/dL — ABNORMAL HIGH (ref 65–99)
POTASSIUM: 4.1 mmol/L (ref 3.5–5.1)
Sodium: 137 mmol/L (ref 135–145)
Total Bilirubin: 0.3 mg/dL (ref 0.3–1.2)
Total Protein: 6.8 g/dL (ref 6.5–8.1)

## 2016-02-25 LAB — URINALYSIS COMPLETE WITH MICROSCOPIC (ARMC ONLY)
Bilirubin Urine: NEGATIVE
Glucose, UA: NEGATIVE mg/dL
KETONES UR: NEGATIVE mg/dL
NITRITE: POSITIVE — AB
PH: 6 (ref 5.0–8.0)
PROTEIN: NEGATIVE mg/dL
Specific Gravity, Urine: 1.004 — ABNORMAL LOW (ref 1.005–1.030)

## 2016-02-25 LAB — PROTIME-INR
INR: 0.98
Prothrombin Time: 13 seconds (ref 11.4–15.2)

## 2016-02-25 LAB — APTT: APTT: 31 s (ref 24–36)

## 2016-02-25 LAB — TROPONIN I

## 2016-02-25 LAB — DIFFERENTIAL
BASOS ABS: 0 10*3/uL (ref 0–0.1)
BASOS PCT: 1 %
EOS ABS: 0.1 10*3/uL (ref 0–0.7)
Eosinophils Relative: 3 %
Lymphocytes Relative: 31 %
Lymphs Abs: 1.5 10*3/uL (ref 1.0–3.6)
MONO ABS: 0.4 10*3/uL (ref 0.2–0.9)
MONOS PCT: 8 %
Neutro Abs: 2.9 10*3/uL (ref 1.4–6.5)
Neutrophils Relative %: 57 %

## 2016-02-25 LAB — CBC
HEMATOCRIT: 41 % (ref 35.0–47.0)
Hemoglobin: 14.1 g/dL (ref 12.0–16.0)
MCH: 34.5 pg — ABNORMAL HIGH (ref 26.0–34.0)
MCHC: 34.5 g/dL (ref 32.0–36.0)
MCV: 99.9 fL (ref 80.0–100.0)
Platelets: 157 10*3/uL (ref 150–440)
RBC: 4.1 MIL/uL (ref 3.80–5.20)
RDW: 13.5 % (ref 11.5–14.5)
WBC: 5 10*3/uL (ref 3.6–11.0)

## 2016-02-25 LAB — GLUCOSE, CAPILLARY: GLUCOSE-CAPILLARY: 132 mg/dL — AB (ref 65–99)

## 2016-02-25 LAB — ETHANOL

## 2016-02-25 NOTE — ED Notes (Signed)
Pt returned from CT at this time. NAD noted. Pt denies any needs at this time. Will continue to monitor for further patient needs. VSS at this time.

## 2016-02-25 NOTE — ED Provider Notes (Signed)
[facility] Emergency Department Provider Note    First MD Initiated Contact with Patient 02/25/16 1350     (approximate)  I have reviewed the triage vital signs and the nursing notes.   HISTORY  Chief Complaint Seizures    HPI Marcia Brown is a 60 y.o. 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 Patient was from nursing facility with new staff that amount familiar with the patient. Very limited history as to what has been happening over the past several days.  Does have notable right facial droop. Uncertain last seen normal. EMS reports early this morning. Patient encephalopathic upon arrival and unable to help with history. Denies any pain at this time.   Past Medical History:  Diagnosis Date  . Anxiety   . Asthma   . COPD (chronic obstructive pulmonary disease) ([place])   . High cholesterol   . Seizures ([place])   . Sleep apnea     Patient Active Problem List   Diagnosis Date Noted  . Seizure ([place]) 02/03/2015    Past Surgical History:  Procedure Laterality Date  . ESOPHAGEAL DILATION      Prior to Admission medications   Medication Sig Start Date End Date Taking? Authorizing Provider  acetaminophen (TYLENOL) 500 MG tablet Take 500 mg by mouth every 8 (eight) hours as needed for mild pain.     Historical Provider, MD  albuterol (PROVENTIL HFA;VENTOLIN HFA) 108 (90 BASE) MCG/ACT inhaler Inhale 2 puffs into the lungs every 4 (four) hours as needed for wheezing or shortness of breath.    Historical Provider, MD  atorvastatin (LIPITOR) 10 MG tablet Take 10 mg by mouth at bedtime.     Historical Provider, MD  cholecalciferol (VITAMIN D) 400 UNITS TABS tablet Take 400 Units by mouth daily.     Historical Provider, MD  clonazePAM (KLONOPIN) 0.5 MG tablet Take 0.5 mg by mouth 3 (three) times daily.     Historical Provider, MD    cyanocobalamin 500 MCG tablet Take 500 mcg by mouth daily.    Historical Provider, MD  DULoxetine (CYMBALTA) 60 MG capsule Take 60 mg by mouth daily.    Historical Provider, MD  ferrous sulfate 325 (65 FE) MG tablet Take 325 mg by mouth 3 (three) times daily with meals.    Historical Provider, MD  folic acid (FOLVITE) 1 MG tablet Take 1 mg by mouth daily.    Historical Provider, MD  gabapentin (NEURONTIN) 100 MG capsule Take 200 mg by mouth 2 (two) times daily.    Historical Provider, MD  lacosamide 100 MG TABS Take 1 tablet (100 mg total) by mouth 2 (two) times daily. 02/04/15   Hillary Bow, MD  lamoTRIgine (LAMICTAL) 100 MG tablet Take 200 mg by mouth 2 (two) times daily.    Historical Provider, MD  levETIRAcetam (KEPPRA) 1000 MG tablet Take 1 tablet (1,000 mg total) by mouth 2 (two) times daily. 11/26/15   Daymon Larsen, MD  meloxicam (MOBIC) 15 MG tablet Take 1 tablet (15 mg total) by mouth daily. 12/23/15   Charline Bills Cuthriell, PA-C  mometasone-formoterol (DULERA) 100-5 MCG/ACT AERO Inhale 2 puffs into the lungs 2 (two) times daily.    Historical Provider, MD  ondansetron (ZOFRAN) 4 MG tablet Take 4 mg by mouth every 4 (four) hours as needed for nausea or vomiting.     Historical Provider, MD  pantoprazole (PROTONIX) 40 MG tablet Take 40 mg by mouth daily before breakfast.     Historical Provider, MD  phenytoin (DILANTIN) 100 MG ER capsule Take 100 mg by mouth 2 (two) times daily.     Historical Provider, MD  Polyethyl Glycol-Propyl Glycol (SYSTANE) 0.4-0.3 % SOLN Place 2 drops into both eyes 2 (two) times daily.     Historical Provider, MD  traMADol (ULTRAM) 50 MG tablet Take 50 mg by mouth 2 (two) times daily as needed for moderate pain.    Historical Provider, MD    Allergies Aspirin  History reviewed. No pertinent family history.  Social History Social History  Substance Use Topics  . Smoking status: Current Every Day Smoker    Packs/day: 0.50    Types: Cigarettes  .  Smokeless tobacco: Never Used  . Alcohol use No    Review of Systems Unable to obtain due to acute encephalopathy ____________________________________________   PHYSICAL EXAM:  VITAL SIGNS: Vitals:   02/25/16 1630 02/25/16 1730  BP: 109/62 129/73  Pulse: 62 70  Resp: 16 18  Temp:  97.9 F (36.6 C)    Constitutional: Elderly female acutely encephalopathic. Does have right facial droop. Eyes: Conjunctivae are normal. PERRL. EOMI. Head: Atraumatic. Nose: No congestion/rhinnorhea. Mouth/Throat: Mucous membranes are moist.  Oropharynx non-erythematous. Neck: No stridor. Painless ROM. No cervical spine tenderness to palpation Hematological/Lymphatic/Immunilogical: No cervical lymphadenopathy. Cardiovascular: Normal rate, regular rhythm. Grossly normal heart sounds.  Good peripheral circulation. Respiratory: Normal respiratory effort.  No retractions. Lungs CTAB. Gastrointestinal: Soft and nontender. No distention. No abdominal bruits. No CVA tenderness.   Musculoskeletal: No lower extremity tenderness nor edema.  No joint effusions. Neurologic:  Dysarthric speech. Has right facial droop.  Other cranial nerves intact. No pronator drift. Equal grip strength. Bilateral lower extremity strength intact. Skin:  Skin is warm, dry and intact. No rash noted.  ____________________________________________   LABS (all labs ordered are listed, but only abnormal results are displayed)  Results for orders placed or performed during the hospital encounter of 02/25/16 (from the past 24 hour(s))  Ethanol     Status: None   Collection Time: 02/25/16  1:46 PM  Result Value Ref Range   Alcohol, Ethyl (B) <5 <5 mg/dL  Protime-INR     Status: None   Collection Time: 02/25/16  1:46 PM  Result Value Ref Range   Prothrombin Time 13.0 11.4 - 15.2 seconds   INR 0.98   APTT     Status: None   Collection Time: 02/25/16  1:46 PM  Result Value Ref Range   aPTT 31 24 - 36 seconds  CBC     Status:  Abnormal   Collection Time: 02/25/16  1:46 PM  Result Value Ref Range   WBC 5.0 3.6 - 11.0 K/uL   RBC 4.10 3.80 - 5.20 MIL/uL   Hemoglobin 14.1 12.0 - 16.0 g/dL   HCT 41.0 35.0 - 47.0 %   MCV 99.9 80.0 - 100.0 fL   MCH 34.5 (H) 26.0 - 34.0 pg   MCHC 34.5 32.0 - 36.0 g/dL   RDW 13.5 11.5 - 14.5 %   Platelets 157 150 - 440 K/uL  Differential     Status: None   Collection Time: 02/25/16  1:46 PM  Result Value Ref Range   Neutrophils Relative % 57 %   Neutro Abs 2.9 1.4 - 6.5 K/uL   Lymphocytes Relative 31 %   Lymphs Abs 1.5 1.0 - 3.6 K/uL   Monocytes Relative 8 %  Monocytes Absolute 0.4 0.2 - 0.9 K/uL   Eosinophils Relative 3 %   Eosinophils Absolute 0.1 0 - 0.7 K/uL   Basophils Relative 1 %   Basophils Absolute 0.0 0 - 0.1 K/uL  Comprehensive metabolic panel     Status: Abnormal   Collection Time: 02/25/16  1:46 PM  Result Value Ref Range   Sodium 137 135 - 145 mmol/L   Potassium 4.1 3.5 - 5.1 mmol/L   Chloride 104 101 - 111 mmol/L   CO2 25 22 - 32 mmol/L   Glucose, Bld 143 (H) 65 - 99 mg/dL   BUN 13 6 - 20 mg/dL   Creatinine, Ser 1.09 (H) 0.44 - 1.00 mg/dL   Calcium 8.9 8.9 - 10.3 mg/dL   Total Protein 6.8 6.5 - 8.1 g/dL   Albumin 4.1 3.5 - 5.0 g/dL   AST 28 15 - 41 U/L   ALT 23 14 - 54 U/L   Alkaline Phosphatase 65 38 - 126 U/L   Total Bilirubin 0.3 0.3 - 1.2 mg/dL   GFR calc non Af Amer 53 (L) >60 mL/min   GFR calc Af Amer >60 >60 mL/min   Anion gap 8 5 - 15  Troponin I     Status: None   Collection Time: 02/25/16  1:46 PM  Result Value Ref Range   Troponin I <0.03 <0.03 ng/mL  Glucose, capillary     Status: Abnormal   Collection Time: 02/25/16  2:53 PM  Result Value Ref Range   Glucose-Capillary 132 (H) 65 - 99 mg/dL  Urine Drug Screen, Qualitative ([facility] only)     Status: None   Collection Time: 02/25/16  4:40 PM  Result Value Ref Range   Tricyclic, Ur Screen NONE DETECTED NONE DETECTED   Amphetamines, Ur Screen NONE DETECTED NONE DETECTED   MDMA  (Ecstasy)Ur Screen NONE DETECTED NONE DETECTED   Cocaine Metabolite,Ur [place] NONE DETECTED NONE DETECTED   Opiate, Ur Screen NONE DETECTED NONE DETECTED   Phencyclidine (PCP) Ur S NONE DETECTED NONE DETECTED   Cannabinoid 50 Ng, Ur [place] NONE DETECTED NONE DETECTED   Barbiturates, Ur Screen NONE DETECTED NONE DETECTED   Benzodiazepine, Ur Scrn NONE DETECTED NONE DETECTED   Methadone Scn, Ur NONE DETECTED NONE DETECTED  Urinalysis complete, with microscopic ([facility] only)     Status: Abnormal   Collection Time: 02/25/16  4:40 PM  Result Value Ref Range   Color, Urine YELLOW (A) YELLOW   APPearance HAZY (A) CLEAR   Glucose, UA NEGATIVE NEGATIVE mg/dL   Bilirubin Urine NEGATIVE NEGATIVE   Ketones, ur NEGATIVE NEGATIVE mg/dL   Specific Gravity, Urine 1.004 (L) 1.005 - 1.030   Hgb urine dipstick 2+ (A) NEGATIVE   pH 6.0 5.0 - 8.0   Protein, ur NEGATIVE NEGATIVE mg/dL   Nitrite POSITIVE (A) NEGATIVE   Leukocytes, UA 3+ (A) NEGATIVE   RBC / HPF 0-5 0 - 5 RBC/hpf   WBC, UA 6-30 0 - 5 WBC/hpf   Bacteria, UA RARE (A) NONE SEEN   Squamous Epithelial / LPF 0-5 (A) NONE SEEN   ____________________________________________  EKG My review and personal interpretation at Time: 13:43   Indication: ams  Rate: 60  Rhythm: nsr Axis: normal Other: no acute ischemia ____________________________________________  RADIOLOGY  CT head IMPRESSION: Stable encephalomalacia of the anterior right temporal lobe and diffuse atrophy. Stable chronic deformity of the right lateral orbit and temporal bone. No acute findings.  XR knee IMPRESSION: Equivocal fractures of both the proximal tibia  medially and laterally. No knee effusion. Consider CT or MRI for further evaluation as clinically indicated.  Moderate tricompartmental degenerative changes  CT knee IMPRESSION: No acute fracture or dislocation.  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  Small joint effusion. ____________________________________________   PROCEDURES  Procedure(s) performed: none    Critical Care performed: no ____________________________________________   INITIAL IMPRESSION / ASSESSMENT AND PLAN / ED COURSE  Pertinent labs & imaging results that were available during my care of the patient were reviewed by me and considered in my medical decision making (see chart for details).  DDX:cva, seizure, sepsis, trauma, dehydration  MAHANA COUNTRYMAN is a 60 y.o. who presents to the ED with seizure-like episode presented from nursing home. Patient arrives with right facial droop and does appear confused. No certain last time seen normal. Only a stroke deficit is right facial droop which appears to be improving. Will order CT imaging to evaluate for bleed versus stroke. With report of seizure-like activity I am suspicious for times paralysis. The patient will be placed on continuous pulse oximetry and telemetry for monitoring.  Laboratory evaluation will be sent to evaluate for the above complaints.     Clinical Course  Comment By Time  On further review of patient's chart she has an extensive history of seizure disorder with multiple presentations to the ER for similar symptoms.  CT imaging shows no evidence of acute abnormality. EKG without any dysrhythmia. Marcia Lot, MD 09/16 [phone]  Patient reassessed and states she feels much better. On further questioning states that she's had a history of weakness on the right face. Denies missing any medications. Denies any chest pain or shortness of breath. Patient states that she feels currently at her baseline. Only complaining of left knee pain at this time. We'll order x-ray Marcia Lot, MD 09/16 1439  XR knee concerning for tibial plateau fracture, will order CT due to concern for fracture Marcia Lot, MD 09/16 1517   CT imaging without evidence of acute fracture. Patient is hemodynamically stable and  without neuro deficit at this time. She's had multiple admissions for recurrent seizures and has follow-up with neurology. Do not feel patient requires admission to hospital at this time she is not demonstrating any signs of status epilepticus.  Have discussed with the patient and available family all diagnostics and treatments performed thus far and all questions were answered to the best of my ability. The patient demonstrates understanding and agreement with plan.    ____________________________________________   FINAL CLINICAL IMPRESSION(S) / ED DIAGNOSES  Final diagnoses:  Knee pain, acute, left  Seizure-like activity ([place])      NEW MEDICATIONS STARTED DURING THIS VISIT:  Discharge Medication List as of 02/25/2016  4:28 PM       Note:  This document was prepared using Dragon voice recognition software and may include unintentional dictation errors.    Marcia Lot, MD 02/25/16 2112

## 2016-02-25 NOTE — ED Notes (Signed)
Spoke with Dario Guardian, RN who states that the facility is unable to provide transport from the hospital back to facility. Will notify MD and sent patient back via EMS.
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
## 2016-02-25 NOTE — ED Notes (Signed)
Pt repositioned in bed. NAD Noted at this time. Denies any needs. Reinforced call bed use to patient, patient states understanding. Will continue to monitor for further patient needs.

## 2016-02-25 NOTE — ED Notes (Signed)
NAD noted at time of D/C. Pt taken back to the [facility] via [employer]. Pt denies any comments/concerns with D/C instructions.

## 2016-02-25 NOTE — ED Notes (Signed)
Padded side rails placed on bed, and fall pads placed on the floor. Yellow socks placed on patient and fall signs placed on the door.

## 2016-03-06 ENCOUNTER — Ambulatory Visit (INDEPENDENT_AMBULATORY_CARE_PROVIDER_SITE_OTHER): Payer: Medicaid Other | Admitting: Podiatry

## 2016-03-06 ENCOUNTER — Encounter: Payer: Self-pay | Admitting: Podiatry

## 2016-03-06 ENCOUNTER — Ambulatory Visit (INDEPENDENT_AMBULATORY_CARE_PROVIDER_SITE_OTHER): Payer: Medicaid Other

## 2016-03-06 VITALS — BP 149/80 | HR 61 | Resp 16 | Ht 62.0 in | Wt 203.0 lb

## 2016-03-06 DIAGNOSIS — M722 Plantar fascial fibromatosis: Secondary | ICD-10-CM

## 2016-03-06 DIAGNOSIS — M79671 Pain in right foot: Secondary | ICD-10-CM | POA: Diagnosis not present

## 2016-03-06 MED ORDER — BETAMETHASONE SOD PHOS & ACET 6 (3-3) MG/ML IJ SUSP: 12.0000 mg | Freq: Once | INTRAMUSCULAR | Status: DC

## 2016-03-06 NOTE — Progress Notes (Signed)
Patient ID: Marcia Brown, female   DOB: [birth year], 60 y.o.   MRN: [id number] Subjective: Patient presents today for pain and tenderness along the right plantar foot. Patient states that she does feel a lump in the bottom of her foot next been going on for the past 2 months. She has noticed pain for the past 2 months and she denies any trauma. Patient presents today for further treatment and evaluation.  Patient also presents with her niece who is her power of attorney.  Objective: Physical Exam General: The patient is alert and oriented x3 in no acute distress.  Dermatology: Skin is warm, dry and supple bilateral lower extremities. Negative for open lesions or macerations bilateral.   Vascular: Dorsalis Pedis and Posterior Tibial pulses palpable bilateral.  Capillary fill time is immediate to all digits.  Neurological: Epicritic and protective threshold intact bilateral.   Musculoskeletal: Tenderness to palpation at the medial calcaneal tubercale and through the insertion of the plantar fascia of the right foot. Also pain on palpation to the palpable mass in the plantar aspect of the right midfoot. The mass measures about 2 cm in diameter.  All other joints range of motion within normal limits bilateral. Strength 5/5 in all groups bilateral.   Assessment: #1 plantar fibroma right plantar foot #2 mid substance plantar fasciitis right #3 pain in right foot  Problem List Items Addressed This Visit    None    Visit Diagnoses    Foot pain, right    -  Primary   Relevant Orders   DG Foot Complete Right       Plan of Care:  1. Patient evaluated. Xrays reviewed.   2. Injection of 0.5cc Celestone soluspan injected into the right heel.  3. Instructed patient regarding therapies and modalities at home to alleviate symptoms.  4. Discussed the conservative versus surgical treatment options for plantar fibroma. Patient at this time opts for conservative treatment. If pain and tenderness  continue, then the patient will require surgical fibroma resection. 5. Return to clinic in 4 weeks.

## 2016-03-06 NOTE — Progress Notes (Signed)
   Subjective:    Patient ID: Marcia Brown, female    DOB: 1955/08/24, 60 y.o.   MRN: [id number]  HPI    Review of Systems  All other systems reviewed and are negative.      Objective:   Physical Exam        Assessment & Plan:

## 2016-03-20 ENCOUNTER — Emergency Department
Admission: EM | Admit: 2016-03-20 | Discharge: 2016-03-20 | Disposition: A | Discharge status: Home / Self Care | Payer: Medicaid Other | Attending: Student | Admitting: Student

## 2016-03-20 ENCOUNTER — Encounter: Payer: Self-pay | Admitting: Emergency Medicine

## 2016-03-20 DIAGNOSIS — J449 Chronic obstructive pulmonary disease, unspecified: Secondary | ICD-10-CM | POA: Insufficient documentation

## 2016-03-20 DIAGNOSIS — G40909 Epilepsy, unspecified, not intractable, without status epilepticus: Secondary | ICD-10-CM | POA: Insufficient documentation

## 2016-03-20 DIAGNOSIS — J45909 Unspecified asthma, uncomplicated: Secondary | ICD-10-CM | POA: Insufficient documentation

## 2016-03-20 DIAGNOSIS — F17210 Nicotine dependence, cigarettes, uncomplicated: Secondary | ICD-10-CM | POA: Insufficient documentation

## 2016-03-20 DIAGNOSIS — Z79899 Other long term (current) drug therapy: Secondary | ICD-10-CM | POA: Diagnosis not present

## 2016-03-20 DIAGNOSIS — R569 Unspecified convulsions: Secondary | ICD-10-CM

## 2016-03-20 DIAGNOSIS — Z791 Long term (current) use of non-steroidal anti-inflammatories (NSAID): Secondary | ICD-10-CM | POA: Diagnosis not present

## 2016-03-20 LAB — CBC WITH DIFFERENTIAL/PLATELET
BASOS PCT: 1 %
Basophils Absolute: 0 10*3/uL (ref 0–0.1)
EOS ABS: 0.2 10*3/uL (ref 0–0.7)
EOS PCT: 3 %
HCT: 40.4 % (ref 35.0–47.0)
Hemoglobin: 13.8 g/dL (ref 12.0–16.0)
LYMPHS ABS: 1.4 10*3/uL (ref 1.0–3.6)
Lymphocytes Relative: 27 %
MCH: 34.4 pg — AB (ref 26.0–34.0)
MCHC: 34.2 g/dL (ref 32.0–36.0)
MCV: 100.6 fL — ABNORMAL HIGH (ref 80.0–100.0)
Monocytes Absolute: 0.5 10*3/uL (ref 0.2–0.9)
Monocytes Relative: 9 %
Neutro Abs: 3.2 10*3/uL (ref 1.4–6.5)
Neutrophils Relative %: 60 %
PLATELETS: 180 10*3/uL (ref 150–440)
RBC: 4.02 MIL/uL (ref 3.80–5.20)
RDW: 14 % (ref 11.5–14.5)
WBC: 5.3 10*3/uL (ref 3.6–11.0)

## 2016-03-20 LAB — COMPREHENSIVE METABOLIC PANEL
ALT: 23 U/L (ref 14–54)
ANION GAP: 8 (ref 5–15)
AST: 22 U/L (ref 15–41)
Albumin: 4.3 g/dL (ref 3.5–5.0)
Alkaline Phosphatase: 66 U/L (ref 38–126)
BUN: 15 mg/dL (ref 6–20)
CALCIUM: 9.3 mg/dL (ref 8.9–10.3)
CHLORIDE: 104 mmol/L (ref 101–111)
CO2: 27 mmol/L (ref 22–32)
Creatinine, Ser: 0.82 mg/dL (ref 0.44–1.00)
GFR calc non Af Amer: >60 mL/min (ref >60)
Glucose, Bld: 91 mg/dL (ref 65–99)
POTASSIUM: 4.1 mmol/L (ref 3.5–5.1)
SODIUM: 139 mmol/L (ref 135–145)
Total Bilirubin: 0.5 mg/dL (ref 0.3–1.2)
Total Protein: 6.9 g/dL (ref 6.5–8.1)

## 2016-03-20 LAB — URINALYSIS COMPLETE WITH MICROSCOPIC (ARMC ONLY)
BILIRUBIN URINE: NEGATIVE
Glucose, UA: NEGATIVE mg/dL
Ketones, ur: NEGATIVE mg/dL
LEUKOCYTES UA: NEGATIVE
NITRITE: NEGATIVE
PH: 7 (ref 5.0–8.0)
Protein, ur: NEGATIVE mg/dL
Specific Gravity, Urine: 1.003 — ABNORMAL LOW (ref 1.005–1.030)

## 2016-03-20 LAB — ETHANOL: Alcohol, Ethyl (B): <5 mg/dL (ref <5)

## 2016-03-20 MED ORDER — SODIUM CHLORIDE 0.9 % IV BOLUS (SEPSIS): 1000.0000 mL | Freq: Once | INTRAVENOUS | Status: DC

## 2016-03-20 MED ORDER — PHENYTOIN SODIUM EXTENDED 100 MG PO CAPS
100.0000 mg | ORAL_CAPSULE | Freq: Once | ORAL | Status: AC
  Administered 2016-03-20: 100 mg via ORAL
  Filled 2016-03-20: qty 1

## 2016-03-20 MED ORDER — DILTIAZEM HCL 25 MG/5ML IV SOLN: 10.0000 mg | Freq: Once | INTRAVENOUS | Status: DC

## 2016-03-20 MED ORDER — LACOSAMIDE 200 MG PO TABS
100.0000 mg | ORAL_TABLET | Freq: Once | ORAL | Status: AC
  Administered 2016-03-20: 100 mg via ORAL
  Filled 2016-03-20: qty 1

## 2016-03-20 MED ORDER — CLONAZEPAM 0.5 MG PO TABS
0.5000 mg | ORAL_TABLET | Freq: Once | ORAL | Status: AC
  Administered 2016-03-20: 0.5 mg via ORAL
  Filled 2016-03-20: qty 1

## 2016-03-20 MED ORDER — LEVETIRACETAM 500 MG PO TABS
1000.0000 mg | ORAL_TABLET | Freq: Once | ORAL | Status: AC
  Administered 2016-03-20: 1000 mg via ORAL
  Filled 2016-03-20: qty 2

## 2016-03-20 MED ORDER — LAMOTRIGINE 100 MG PO TABS
200.0000 mg | ORAL_TABLET | Freq: Once | ORAL | Status: AC
  Administered 2016-03-20: 200 mg via ORAL
  Filled 2016-03-20: qty 2

## 2016-03-20 NOTE — ED Notes (Signed)
RN Mallie Mussel attached new leads and they picked up tracing.

## 2016-03-20 NOTE — ED Notes (Signed)
Had problems with EKG leads picking up tracing so we did one from the mobile EKG and completed it. Mallie Mussel RN then got new leads to hook patient up and see if they would work

## 2016-03-20 NOTE — ED Provider Notes (Addendum)
[facility] Emergency Department Provider Note   ____________________________________________   First MD Initiated Contact with Patient 03/20/16 1923     (approximate)  I have reviewed the triage vital signs and the nursing notes.   HISTORY  Chief Complaint Seizures  Caveat-history of present illness and review of systems is limited due to the patient's poor recollection of the events. Information is obtained partially from the patient, EMS on arrival, as well as staff at the [facility].  HPI Marcia Brown is a 60 y.o. 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 According to staff there, she had a seizure which is consistent with her usual seizures and includes her screaming/crying out "please", repeating her words and requesting to hold people's hands. She did not fall or hit her head or injure herself in any way. On EMS arrival, she was awake, alert and oriented. She denies tongue biting or loss of bowel or bladder control. Patient has no complaints at this time except that she is concerned that staff at the [facility] have not been giving her her clonazepam as prescribed. She denies any chest pain or difficulty breathing, she denies any recent illness include no vomiting, diarrhea, fevers or chills, no cough or symptoms of an upper respiratory tract infection.   Past Medical History:  Diagnosis Date  . Anxiety   . Asthma   . COPD (chronic obstructive pulmonary disease) ([place])   . High cholesterol   . Seizures ([place])   . Sleep apnea     Patient Active Problem List   Diagnosis Date Noted  . Seizure ([place]) 02/03/2015    Past Surgical History:  Procedure Laterality Date  . ESOPHAGEAL DILATION      Prior to Admission medications   Medication Sig Start Date End Date Taking? Authorizing  Provider  acetaminophen (TYLENOL) 500 MG tablet Take 500 mg by mouth every 8 (eight) hours as needed for mild pain.     Historical Provider, MD  albuterol (PROVENTIL HFA;VENTOLIN HFA) 108 (90 BASE) MCG/ACT inhaler Inhale 2 puffs into the lungs every 4 (four) hours as needed for wheezing or shortness of breath.    Historical Provider, MD  atorvastatin (LIPITOR) 10 MG tablet Take 10 mg by mouth at bedtime.     Historical Provider, MD  cholecalciferol (VITAMIN D) 400 UNITS TABS tablet Take 400 Units by mouth daily.     Historical Provider, MD  clonazePAM (KLONOPIN) 0.5 MG tablet Take 0.5 mg by mouth 3 (three) times daily.     Historical Provider, MD  cyanocobalamin 500 MCG tablet Take 500 mcg by mouth daily.    Historical Provider, MD  DULoxetine (CYMBALTA) 60 MG capsule Take 60 mg by mouth daily.    Historical Provider, MD  ferrous sulfate 325 (65 FE) MG tablet Take 325 mg by mouth 3 (three) times daily with meals.    Historical Provider, MD  folic acid (FOLVITE) 1 MG tablet Take 1 mg by mouth daily.    Historical Provider, MD  gabapentin (NEURONTIN) 100 MG capsule Take 200 mg by mouth 2 (two) times daily.    Historical Provider, MD  lacosamide 100 MG TABS Take 1 tablet (100 mg total) by mouth 2 (two) times daily. 02/04/15   Hillary Bow, MD  lamoTRIgine (LAMICTAL) 100 MG tablet Take 200 mg by mouth 2 (two) times  daily.    Historical Provider, MD  levETIRAcetam (KEPPRA) 1000 MG tablet Take 1 tablet (1,000 mg total) by mouth 2 (two) times daily. 11/26/15   Daymon Larsen, MD  meloxicam (MOBIC) 15 MG tablet Take 1 tablet (15 mg total) by mouth daily. 12/23/15   Charline Bills Cuthriell, PA-C  mometasone-formoterol (DULERA) 100-5 MCG/ACT AERO Inhale 2 puffs into the lungs 2 (two) times daily.    Historical Provider, MD  ondansetron (ZOFRAN) 4 MG tablet Take 4 mg by mouth every 4 (four) hours as needed for nausea or vomiting.     Historical Provider, MD  pantoprazole (PROTONIX) 40 MG tablet Take 40 mg by mouth  daily before breakfast.     Historical Provider, MD  phenytoin (DILANTIN) 100 MG ER capsule Take 100 mg by mouth 2 (two) times daily.     Historical Provider, MD  Polyethyl Glycol-Propyl Glycol (SYSTANE) 0.4-0.3 % SOLN Place 2 drops into both eyes 2 (two) times daily.     Historical Provider, MD  traMADol (ULTRAM) 50 MG tablet Take 50 mg by mouth 2 (two) times daily as needed for moderate pain.    Historical Provider, MD    Allergies Aspirin  History reviewed. No pertinent family history.  Social History Social History  Substance Use Topics  . Smoking status: Current Every Day Smoker    Packs/day: 0.50    Types: Cigarettes  . Smokeless tobacco: Never Used  . Alcohol use No    Review of Systems Constitutional: No fever/chills Eyes: No visual changes. ENT: No sore throat. Cardiovascular: Denies chest pain. Respiratory: Denies shortness of breath. Gastrointestinal: No abdominal pain.  No nausea, no vomiting.  No diarrhea.  No constipation. Genitourinary: Negative for dysuria. Musculoskeletal: Negative for back pain. Skin: Negative for rash. Neurological: Negative for headaches, focal weakness or numbness.  10-point ROS otherwise negative.  ____________________________________________   PHYSICAL EXAM:  Vitals:   03/20/16 1923 03/20/16 1929  BP:  (!) 169/71  Pulse:  60  Resp:  16  Temp:  98 F (36.7 C)  TempSrc:  Oral  SpO2:  97%  Weight: 203 lb (92.1 kg)   Height: 5\' 2"  (1.575 m)      Constitutional: Alert and oriented x 4. Well appearing and in no acute distress. Eyes: Conjunctivae are normal. PERRL. EOMI. Head: Atraumatic. Nose: No congestion/rhinnorhea. Mouth/Throat: Mucous membranes are moist.  Oropharynx non-erythematous. Neck: No stridor.  No cervical spine tenderness to palpation. Cardiovascular: Normal rate, regular rhythm. Grossly normal heart sounds.  Good peripheral circulation. Respiratory: Normal respiratory effort.  No retractions. Lungs  CTAB. Gastrointestinal: Soft and nontender. No distention.  No CVA tenderness. Genitourinary: deferred Musculoskeletal: No lower extremity tenderness nor edema.  No joint effusions. Neurologic:  Normal speech and language. Faint right facial droop Which patient reports is chronic and related to her prior stroke. 5 out of 5 strength in bilateral upper and lower extremities. Sensation intact to light touch throughout.. Skin:  Skin is warm, dry and intact. No rash noted. Psychiatric: Mood and affect are normal. Speech and behavior are normal.  ____________________________________________   LABS (all labs ordered are listed, but only abnormal results are displayed)  Labs Reviewed  CBC WITH DIFFERENTIAL/PLATELET - Abnormal; Notable for the following:       Result Value   MCV 100.6 (*)    MCH 34.4 (*)    All other components within normal limits  URINALYSIS COMPLETEWITH MICROSCOPIC ([facility] ONLY) - Abnormal; Notable for the following:    Color, Urine COLORLESS (*)  APPearance CLEAR (*)    Specific Gravity, Urine 1.003 (*)    Hgb urine dipstick 2+ (*)    Bacteria, UA RARE (*)    Squamous Epithelial / LPF 0-5 (*)    All other components within normal limits  COMPREHENSIVE METABOLIC PANEL  ETHANOL  PHENYTOIN LEVEL, FREE AND TOTAL   ____________________________________________  EKG  ED ECG REPORT I, Joanne Gavel, the attending physician, personally viewed and interpreted this ECG.   Date: 03/20/2016  EKG Time: 19:46  Rate: 59  Rhythm: sinus bradycardia  Axis: normal  Intervals:none  ST&T Change: No acute ST elevation or acute ST depression. Sinus bradycardia, otherwise normal EKG.  ____________________________________________  RADIOLOGY  none ____________________________________________   PROCEDURES  Procedure(s) performed: None  Procedures  Critical Care performed: No  ____________________________________________   INITIAL IMPRESSION / ASSESSMENT AND PLAN /  ED COURSE  Pertinent labs & imaging results that were available during my care of the patient were reviewed by me and considered in my medical decision making (see chart for details).  Marcia Brown is a 60 y.o. 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 On exam, she is very well-appearing and in no acute distress. Her vital signs are stable, she is afebrile. She denies any acute complaints. She denies any pain. Her neurological exam is at baseline. According to staff at the [facility] she has one of these seizures "every day or every other day". We'll give her prescribed nightly antiepileptic medications as well as her 8 PM dose of clonazepam, obtain screening labs, EKG, observe and reassess for disposition.  ----------------------------------------- 10:51 PM on 03/20/2016 ----------------------------------------- Patient has been observed for greater than 3 hours in the emergency department without any recurrence of seizure activity. She is sitting up in bed, conversing with a friend at bedside and is requesting discharge. CBC CMP generally unremarkable. Undetectable ethanol level. Urinalysis is not consistent with infection. Discussed return precautions and need for close PCP and neurology follow-up and she is comfortable with the discharge plan. DC home.   Clinical Course     ____________________________________________   FINAL CLINICAL IMPRESSION(S) / ED DIAGNOSES  Final diagnoses:  Seizure ([place])      NEW MEDICATIONS STARTED DURING THIS VISIT:  New Prescriptions   No medications on file     Note:  This document was prepared using Dragon voice recognition software and may include unintentional dictation errors.    Joanne Gavel, MD 03/20/16 [id number]    Joanne Gavel, MD 03/20/16 [phone]

## 2016-03-20 NOTE — ED Notes (Signed)
Orders placed on wrong patient. Orders d/c

## 2016-03-20 NOTE — ED Triage Notes (Signed)
Pt came in EMS from the [facility] for a seizure. Pt states she did not get all her medicine.

## 2016-03-22 LAB — PHENYTOIN LEVEL, FREE AND TOTAL
PHENYTOIN FREE: 0.8 ug/mL — AB (ref 1.0–2.0)
PHENYTOIN, TOTAL: 12 ug/mL (ref 10.0–20.0)

## 2016-03-23 ENCOUNTER — Telehealth: Payer: Self-pay | Admitting: Emergency Medicine

## 2016-03-23 NOTE — Telephone Encounter (Signed)
Called laekesha at the [facility] and gave result of phenytoin free and total to give to patient's pcp.

## 2016-03-29 ENCOUNTER — Ambulatory Visit: Payer: Medicaid Other | Attending: Specialist

## 2016-03-29 DIAGNOSIS — G4733 Obstructive sleep apnea (adult) (pediatric): Secondary | ICD-10-CM | POA: Insufficient documentation

## 2016-04-03 ENCOUNTER — Ambulatory Visit (INDEPENDENT_AMBULATORY_CARE_PROVIDER_SITE_OTHER): Payer: Medicaid Other | Admitting: Podiatry

## 2016-04-03 VITALS — BP 129/73 | HR 57 | Resp 16

## 2016-04-03 DIAGNOSIS — M722 Plantar fascial fibromatosis: Secondary | ICD-10-CM | POA: Diagnosis not present

## 2016-04-03 DIAGNOSIS — M79671 Pain in right foot: Secondary | ICD-10-CM

## 2016-04-03 MED ORDER — NONFORMULARY OR COMPOUNDED ITEM: 1.0000 g | Freq: Four times a day (QID) | 2 refills | Status: DC

## 2016-04-15 NOTE — Progress Notes (Signed)
Patient ID: Marcia Brown, female   DOB: 1955/07/11, 60 y.o.   MRN: [id number] Subjective: Patient presents today for pain and tenderness along the right plantar foot. Patient states that she does feel a lump in the bottom of her foot next been going on for the past 2 months. She has noticed pain for the past 3 months and she denies any trauma. Patient presents today for further treatment and evaluation. Patient states that the injection she received last time did not help. Patient also presents with her niece who is her power of attorney.  Objective: Physical Exam General: The patient is alert and oriented x3 in no acute distress.  Dermatology: Skin is warm, dry and supple bilateral lower extremities. Negative for open lesions or macerations bilateral.   Vascular: Dorsalis Pedis and Posterior Tibial pulses palpable bilateral.  Capillary fill time is immediate to all digits.  Neurological: Epicritic and protective threshold intact bilateral.   Musculoskeletal: Tenderness to palpation at the medial calcaneal tubercale and through the insertion of the plantar fascia of the right foot. Also pain on palpation to the palpable mass in the plantar aspect of the right midfoot. The mass measures about 2 cm in diameter.  All other joints range of motion within normal limits bilateral. Strength 5/5 in all groups bilateral.   Assessment: #1 plantar fibroma right plantar foot #2 mid substance plantar fasciitis right #3 pain in right foot  Problem List Items Addressed This Visit    None       Plan of Care:  1. Patient evaluated.  2. Prescription for anti-inflammatory pain cream dispensed through [facility] 3. Discussed surgical versus conservative management of plantar fibroma. At the moment patient opts for conservative management including padding and shoe gear modifications 4. Return to clinic when necessary

## 2016-05-02 ENCOUNTER — Ambulatory Visit: Payer: Medicaid Other | Attending: Specialist

## 2016-05-04 ENCOUNTER — Ambulatory Visit: Payer: Medicaid Other | Attending: Specialist

## 2016-05-04 DIAGNOSIS — G4733 Obstructive sleep apnea (adult) (pediatric): Secondary | ICD-10-CM | POA: Diagnosis present

## 2016-07-05 ENCOUNTER — Ambulatory Visit: Payer: Medicaid Other | Admitting: Podiatry

## 2016-07-17 ENCOUNTER — Ambulatory Visit (INDEPENDENT_AMBULATORY_CARE_PROVIDER_SITE_OTHER): Payer: Medicaid Other | Admitting: Podiatry

## 2016-07-17 DIAGNOSIS — M79671 Pain in right foot: Secondary | ICD-10-CM

## 2016-07-17 DIAGNOSIS — M722 Plantar fascial fibromatosis: Secondary | ICD-10-CM

## 2016-07-21 ENCOUNTER — Encounter: Payer: Self-pay | Admitting: Emergency Medicine

## 2016-07-21 ENCOUNTER — Emergency Department
Admission: EM | Admit: 2016-07-21 | Discharge: 2016-07-21 | Disposition: A | Discharge status: Home / Self Care | Payer: Medicaid Other | Attending: Emergency Medicine | Admitting: Emergency Medicine

## 2016-07-21 DIAGNOSIS — F17210 Nicotine dependence, cigarettes, uncomplicated: Secondary | ICD-10-CM | POA: Diagnosis not present

## 2016-07-21 DIAGNOSIS — J449 Chronic obstructive pulmonary disease, unspecified: Secondary | ICD-10-CM | POA: Insufficient documentation

## 2016-07-21 DIAGNOSIS — G40909 Epilepsy, unspecified, not intractable, without status epilepticus: Secondary | ICD-10-CM | POA: Insufficient documentation

## 2016-07-21 DIAGNOSIS — R1110 Vomiting, unspecified: Secondary | ICD-10-CM | POA: Insufficient documentation

## 2016-07-21 DIAGNOSIS — R569 Unspecified convulsions: Secondary | ICD-10-CM | POA: Diagnosis present

## 2016-07-21 DIAGNOSIS — Z79899 Other long term (current) drug therapy: Secondary | ICD-10-CM | POA: Insufficient documentation

## 2016-07-21 DIAGNOSIS — R197 Diarrhea, unspecified: Secondary | ICD-10-CM | POA: Insufficient documentation

## 2016-07-21 DIAGNOSIS — J45909 Unspecified asthma, uncomplicated: Secondary | ICD-10-CM | POA: Diagnosis not present

## 2016-07-21 LAB — CBC WITH DIFFERENTIAL/PLATELET
BASOS ABS: 0 10*3/uL (ref 0–0.1)
Basophils Relative: 0 %
EOS ABS: 0.1 10*3/uL (ref 0–0.7)
EOS PCT: 1 %
HCT: 29.3 % — ABNORMAL LOW (ref 35.0–47.0)
Hemoglobin: 10.1 g/dL — ABNORMAL LOW (ref 12.0–16.0)
Lymphocytes Relative: 5 %
Lymphs Abs: 0.2 10*3/uL — ABNORMAL LOW (ref 1.0–3.6)
MCH: 35.7 pg — ABNORMAL HIGH (ref 26.0–34.0)
MCHC: 34.4 g/dL (ref 32.0–36.0)
MCV: 103.6 fL — ABNORMAL HIGH (ref 80.0–100.0)
MONO ABS: 0.1 10*3/uL — AB (ref 0.2–0.9)
Monocytes Relative: 3 %
NEUTROS ABS: 4.4 10*3/uL (ref 1.4–6.5)
Neutrophils Relative %: 91 %
PLATELETS: 186 10*3/uL (ref 150–440)
RBC: 2.83 MIL/uL — ABNORMAL LOW (ref 3.80–5.20)
RDW: 14.8 % — AB (ref 11.5–14.5)
WBC: 4.8 10*3/uL (ref 3.6–11.0)

## 2016-07-21 LAB — COMPREHENSIVE METABOLIC PANEL
ALBUMIN: 4 g/dL (ref 3.5–5.0)
ALT: 20 U/L (ref 14–54)
AST: 25 U/L (ref 15–41)
Alkaline Phosphatase: 57 U/L (ref 38–126)
Anion gap: 7 (ref 5–15)
BUN: 16 mg/dL (ref 6–20)
CHLORIDE: 105 mmol/L (ref 101–111)
CO2: 24 mmol/L (ref 22–32)
Calcium: 8.3 mg/dL — ABNORMAL LOW (ref 8.9–10.3)
Creatinine, Ser: 0.8 mg/dL (ref 0.44–1.00)
GFR calc Af Amer: >60 mL/min (ref >60)
GFR calc non Af Amer: >60 mL/min (ref >60)
GLUCOSE: 115 mg/dL — AB (ref 65–99)
POTASSIUM: 4.1 mmol/L (ref 3.5–5.1)
SODIUM: 136 mmol/L (ref 135–145)
Total Bilirubin: 0.3 mg/dL (ref 0.3–1.2)
Total Protein: 6.2 g/dL — ABNORMAL LOW (ref 6.5–8.1)

## 2016-07-21 LAB — URINALYSIS, COMPLETE (UACMP) WITH MICROSCOPIC
Bilirubin Urine: NEGATIVE
Glucose, UA: NEGATIVE mg/dL
KETONES UR: NEGATIVE mg/dL
Nitrite: NEGATIVE
PROTEIN: NEGATIVE mg/dL
Specific Gravity, Urine: 1.002 — ABNORMAL LOW (ref 1.005–1.030)
pH: 7 (ref 5.0–8.0)

## 2016-07-21 LAB — PHENYTOIN LEVEL, TOTAL: PHENYTOIN LVL: 12.1 ug/mL (ref 10.0–20.0)

## 2016-07-21 MED ORDER — LORAZEPAM 2 MG/ML IJ SOLN
0.5000 mg | Freq: Once | INTRAMUSCULAR | Status: AC
  Administered 2016-07-21: 0.5 mg via INTRAVENOUS
  Filled 2016-07-21: qty 1

## 2016-07-21 MED ORDER — ONDANSETRON 4 MG PO TBDP: 4.0000 mg | ORAL_TABLET | Freq: Three times a day (TID) | ORAL | 0 refills | Status: DC | PRN

## 2016-07-21 MED ORDER — ONDANSETRON HCL 4 MG/2ML IJ SOLN
4.0000 mg | Freq: Once | INTRAMUSCULAR | Status: AC
  Administered 2016-07-21: 4 mg via INTRAVENOUS
  Filled 2016-07-21: qty 2

## 2016-07-21 MED ORDER — SODIUM CHLORIDE 0.9 % IV SOLN
1000.0000 mL | Freq: Once | INTRAVENOUS | Status: AC
  Administered 2016-07-21: 1000 mL via INTRAVENOUS

## 2016-07-21 NOTE — ED Triage Notes (Signed)
Pt ems from the [facility] for witnessed seizure. Pt with hx seizures. Pt with no memory of event. Temp 100.2 here. Per ems pt has had N/V/D today.

## 2016-07-21 NOTE — ED Notes (Signed)
Per the [facility] they have no pt transport.

## 2016-07-21 NOTE — ED Notes (Signed)
Ems called for transport

## 2016-07-21 NOTE — ED Provider Notes (Signed)
[facility] Emergency Department Provider Note        Time seen: ----------------------------------------- 3:05 PM on 07/21/2016 -----------------------------------------    I have reviewed the triage vital signs and the nursing notes.   HISTORY  Chief Complaint No chief complaint on file.    HPI Marcia Brown is a 61 y.o. female who presents to ER being brought by EMS from the [facility] for seizure. She has a known history of seizure disorder and had a witnessed seizure about a minute today. This was a generalized tonic-clonic shaking type seizure. She denies being incontinent or biting her tongue. Patient was noted to have fever as well as vomiting and diarrhea today. She has not had other flu symptoms. She denies any specific pain or complaints after the seizure. She states recently there've been some changes in her seizure medications.   Past Medical History:  Diagnosis Date  . Anxiety   . Asthma   . COPD (chronic obstructive pulmonary disease) ([place])   . High cholesterol   . Seizures ([place])   . Sleep apnea     Patient Active Problem List   Diagnosis Date Noted  . Seizure ([place]) 02/03/2015    Past Surgical History:  Procedure Laterality Date  . ESOPHAGEAL DILATION      Allergies Aspirin  Social History Social History  Substance Use Topics  . Smoking status: Current Every Day Smoker    Packs/day: 0.50    Types: Cigarettes  . Smokeless tobacco: Never Used  . Alcohol use No    Review of Systems Constitutional: Negative for fever. Cardiovascular: Negative for chest pain. Respiratory: Negative for shortness of breath. Gastrointestinal: Negative for abdominal pain, Positive for vomiting and diarrhea Genitourinary: Negative for dysuria. Musculoskeletal: Negative for back pain. Skin: Negative for rash. Neurological: Negative for headaches, focal weakness or numbness.  10-point ROS otherwise  negative.  ____________________________________________   PHYSICAL EXAM:  VITAL SIGNS: ED Triage Vitals  Enc Vitals Group     BP      Pulse      Resp      Temp      Temp src      SpO2      Weight      Height      Head Circumference      Peak Flow      Pain Score      Pain Loc      Pain Edu?      Excl. in [place]?     Constitutional: Alert and oriented. Well appearing and in no distress. Eyes: Conjunctivae are normal. PERRL. Normal extraocular movements. ENT   Head: Normocephalic and atraumatic.   Nose: No congestion/rhinnorhea.   Mouth/Throat: Mucous membranes are moist.   Neck: No stridor. Cardiovascular: Normal rate, regular rhythm. No murmurs, rubs, or gallops. Respiratory: Normal respiratory effort without tachypnea nor retractions. Breath sounds are clear and equal bilaterally. No wheezes/rales/rhonchi. Gastrointestinal: Soft and nontender. Normal bowel sounds Musculoskeletal: Nontender with normal range of motion in all extremities. No lower extremity tenderness nor edema. Neurologic:  Normal speech and language. No gross focal neurologic deficits are appreciated.  Skin:  Skin is warm, dry and intact. No rash noted. Psychiatric: Mood and affect are normal. Speech and behavior are normal.  ____________________________________________  EKG: Interpreted by me.Sinus rhythm rate 83 bpm, normal PR interval, normal QRS, normal QT.  ____________________________________________  ED COURSE:  Pertinent labs & imaging results that were available during my care of the patient were reviewed  by me and considered in my medical decision making (see chart for details). Patient presents to the ER in no distress with seizure and symptoms of gastritis. We will assess with labs and consider imaging. Clinical Course as of Jul 21 1725  Sat Jul 21, 2016  1650 Phenytoin Lvl: 12.1 [JW]    Clinical Course User Index [JW] Earleen Newport, MD    Procedures ____________________________________________   LABS (pertinent positives/negatives)  Labs Reviewed  CBC WITH DIFFERENTIAL/PLATELET - Abnormal; Notable for the following:       Result Value   RBC 2.83 (*)    Hemoglobin 10.1 (*)    HCT 29.3 (*)    MCV 103.6 (*)    MCH 35.7 (*)    RDW 14.8 (*)    Lymphs Abs 0.2 (*)    Monocytes Absolute 0.1 (*)    All other components within normal limits  COMPREHENSIVE METABOLIC PANEL - Abnormal; Notable for the following:    Glucose, Bld 115 (*)    Calcium 8.3 (*)    Total Protein 6.2 (*)    All other components within normal limits  URINALYSIS, COMPLETE (UACMP) WITH MICROSCOPIC - Abnormal; Notable for the following:    Color, Urine STRAW (*)    APPearance CLEAR (*)    Specific Gravity, Urine 1.002 (*)    Hgb urine dipstick MODERATE (*)    Leukocytes, UA TRACE (*)    Bacteria, UA RARE (*)    Squamous Epithelial / LPF 0-5 (*)    All other components within normal limits  PHENYTOIN LEVEL, TOTAL  LEVETIRACETAM LEVEL   ____________________________________________  FINAL ASSESSMENT AND PLAN  Seizure, vomiting and diarrhea  Plan: Patient with labs and imaging as dictated above. Patient does not have further symptoms at this time and her workup has been negative. I will prescribe Zofran as needed for nausea. She currently has clonazepam for anxiety but I will ensure that she takes it 3 times a day. She is stable for outpatient follow-up.   Earleen Newport, MD   Note: This note was generated in part or whole with voice recognition software. Voice recognition is usually quite accurate but there are transcription errors that can and very often do occur. I apologize for any typographical errors that were not detected and corrected.     Earleen Newport, MD 07/21/16 [phone]

## 2016-07-21 NOTE — ED Notes (Signed)
Called for EMS transport [phone]

## 2016-07-24 LAB — LEVETIRACETAM LEVEL: LEVETIRACETAM: 29.9 ug/mL (ref 10.0–40.0)

## 2016-07-27 MED ORDER — BETAMETHASONE SOD PHOS & ACET 6 (3-3) MG/ML IJ SUSP: 3.0000 mg | Freq: Once | INTRAMUSCULAR | Status: DC

## 2016-07-27 NOTE — Progress Notes (Signed)
   Subjective:  Patient presents today for follow-up evaluation and treatment of a plantar fibroma to the right plantar foot. Patient also has a mid substance plantar fasciitis to the right foot as well. Patient states that the lump has gotten worse since her last visit. Painful to walk on.    Objective/Physical Exam General: The patient is alert and oriented x3 in no acute distress.  Dermatology: Skin is warm, dry and supple bilateral lower extremities. Negative for open lesions or macerations.  Vascular: Palpable pedal pulses bilaterally. No edema or erythema noted. Capillary refill within normal limits.  Neurological: Epicritic and protective threshold grossly intact bilaterally.   Musculoskeletal Exam: Palpable mass noted to the mid arch of the right plantar foot consistent with a plantar fibroma. Significant pain on palpation is noted. Mass measures approximately 2.0 cm in diameter. Range of motion within normal limits to all pedal and ankle joints bilateral. Muscle strength 5/5 in all groups bilateral.   Assessment: #1 plantar fibroma right foot #2 mid substance plantar fasciitis right   Plan of Care:  #1 Patient was evaluated. #2 injection of 0.5 mL Celestone Soluspan injected in the patient's right mid substance plantar fascia surrounding the plantar fibroma right foot #3 return to clinic in 4 weeks. If patient is not better we will consider the surgical option which would consist of excision of plantar fibroma right foot. #4 return to clinic in 4 weeks   Edrick Kins, DPM [facility]  Dr. Edrick Kins, [address]                                        [place], [place] [address]                Office [phone]  Fax [phone]

## 2016-08-14 ENCOUNTER — Encounter: Payer: Self-pay | Admitting: Podiatry

## 2016-08-14 ENCOUNTER — Ambulatory Visit (INDEPENDENT_AMBULATORY_CARE_PROVIDER_SITE_OTHER): Payer: Medicaid Other | Admitting: Podiatry

## 2016-08-14 DIAGNOSIS — M722 Plantar fascial fibromatosis: Secondary | ICD-10-CM

## 2016-08-23 ENCOUNTER — Telehealth: Payer: Self-pay | Admitting: *Deleted

## 2016-08-23 NOTE — Telephone Encounter (Signed)
"  She saw Dr. Amalia Hailey in the [facility] office on March 6.  He said it needs to be taken off.  Do you know when the surgery is going to be?  Give me a call."

## 2016-08-23 NOTE — Progress Notes (Signed)
   Subjective:  Patient presents today for follow-up treatment and evaluation of a plantar fibromas to the right foot. Patient states that her right foot hurts all the time. She has to walk on the outside of her foot because of the pain. Patient states the injections do not help long term. Patient presents today for further treatment and evaluation    Objective/Physical Exam General: The patient is alert and oriented x3 in no acute distress.  Dermatology: Skin is warm, dry and supple bilateral lower extremities. Negative for open lesions or macerations.  Vascular: Palpable pedal pulses bilaterally. No edema or erythema noted. Capillary refill within normal limits.  Neurological: Epicritic and protective threshold grossly intact bilaterally.   Musculoskeletal Exam: Palpable mass noted to the mid arch of the right plantar foot consistent with a plantar fibroma. Significant pain on palpation is noted. Mass measures approximately 2.0 cm in diameter. Range of motion within normal limits to all pedal and ankle joints bilateral. Muscle strength 5/5 in all groups bilateral.   Assessment: #1 plantar fibroma right foot #2 mid substance plantar fasciitis right   Plan of Care:  #1 Patient was evaluated. #2 today we discussed additional conservative versus surgical management of the plantar fibromas of the right foot. Patient opts for surgical management. Conservative modalities have been unsuccessful in providing any sort of satisfactory alleviation of symptoms with the patient. The patient was told benefits as well as possible side effects of the surgery. The patient consented for surgical correction. All patient questions were answered. No guarantees were expressed or implied. #3 authorization for surgery initiated today. Surgery will consist of excision of plantar fibroma 2 right foot. #4 return to clinic 1 week postop   Edrick Kins, DPM [facility]  Dr. Edrick Kins, [address]                                        [place], [place] [address]                Office [phone]  Fax [phone]

## 2016-08-24 NOTE — Telephone Encounter (Signed)
I'm returning your call.  You want to schedule surgery.  "Yes, I'm the POA for my aunt.  What's the soonest he can do it?  She's complaining about it and it's coming up on the other foot."  He can do it on April 5.  "Let's do it the following Thursday.  I have a physical therapy appointment on the 5th."  I'll get it scheduled for April 12.

## 2016-09-20 ENCOUNTER — Encounter: Payer: Self-pay | Admitting: Podiatry

## 2016-09-20 DIAGNOSIS — M722 Plantar fascial fibromatosis: Secondary | ICD-10-CM

## 2016-09-20 DIAGNOSIS — D492 Neoplasm of unspecified behavior of bone, soft tissue, and skin: Secondary | ICD-10-CM

## 2016-09-21 ENCOUNTER — Encounter: Payer: Self-pay | Admitting: Podiatry

## 2016-09-26 ENCOUNTER — Encounter: Payer: Self-pay | Admitting: Podiatry

## 2016-09-28 ENCOUNTER — Encounter: Payer: Self-pay | Admitting: Podiatry

## 2016-09-28 ENCOUNTER — Ambulatory Visit (INDEPENDENT_AMBULATORY_CARE_PROVIDER_SITE_OTHER): Payer: Medicaid Other | Admitting: Podiatry

## 2016-09-28 DIAGNOSIS — M722 Plantar fascial fibromatosis: Secondary | ICD-10-CM

## 2016-09-28 DIAGNOSIS — Z9889 Other specified postprocedural states: Secondary | ICD-10-CM

## 2016-09-30 NOTE — Progress Notes (Signed)
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 DOS: 09/20/16. She states she is doing well as long as she does not apply pressure to the areas and walks on her heel.  Objective: Skin incisions are well coapted. Surgical incision site appears healthy with sutures intact.  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 DOS: 09/20/16  Plan of care: Patient was evaluated today. Dressing changed. Orders for home dressing changes provided. Return to clinic in two weeks.

## 2016-10-05 ENCOUNTER — Encounter: Payer: Medicaid Other | Admitting: Podiatry

## 2016-10-12 ENCOUNTER — Ambulatory Visit (INDEPENDENT_AMBULATORY_CARE_PROVIDER_SITE_OTHER): Payer: Self-pay | Admitting: Podiatry

## 2016-10-12 DIAGNOSIS — Z9889 Other specified postprocedural states: Secondary | ICD-10-CM

## 2016-10-12 DIAGNOSIS — M722 Plantar fascial fibromatosis: Secondary | ICD-10-CM

## 2016-10-15 NOTE — Progress Notes (Signed)
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 DOS: 09/20/16. She states she is doing well although bearing weight causes pain.  Objective: Skin incisions are well coapted. Surgical incision site appears healthy with sutures intact.  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 DOS: 09/20/16  Plan of care: Patient was evaluated today. Sutures removed. Instructed pt to apply antibiotic ointment and Band aid daily. Return to clinic in two weeks.

## 2016-10-22 NOTE — Progress Notes (Signed)
DOS 09/20/2016 Excision of Plantar Fibroma times 2 right foot. Any other indicated procedure

## 2016-10-30 ENCOUNTER — Ambulatory Visit (INDEPENDENT_AMBULATORY_CARE_PROVIDER_SITE_OTHER): Payer: Self-pay | Admitting: Podiatry

## 2016-10-30 ENCOUNTER — Encounter: Payer: Self-pay | Admitting: Podiatry

## 2016-10-30 VITALS — BP 133/78

## 2016-10-30 DIAGNOSIS — Z9889 Other specified postprocedural states: Secondary | ICD-10-CM

## 2016-10-30 MED ORDER — MUPIROCIN 2 % EX OINT: 1.0000 "application " | TOPICAL_OINTMENT | Freq: Two times a day (BID) | CUTANEOUS | 0 refills | Status: DC

## 2016-10-30 NOTE — Progress Notes (Signed)
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 DOS: 09/20/16. She reports an ulcer to the plantar aspect of the right foot.  Objective: Skin incisions are well coapted except for central incision dehiscence. No signs of infection.  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 DOS: 09/20/16 Ulcer right plantar foot-surgically dehiscence 1.0 x 1.0 x 0.1 cm.  Plan of care: Patient was evaluated today. Recommended mupirocin ointment and Band-Aid. Prescription for mupirocin ointment provided to patient. Return to clinic in 4 weeks.

## 2016-11-14 ENCOUNTER — Encounter: Payer: Self-pay | Admitting: *Deleted

## 2016-11-14 ENCOUNTER — Emergency Department
Admission: EM | Admit: 2016-11-14 | Discharge: 2016-11-14 | Disposition: A | Discharge status: Home / Self Care | Payer: Medicaid Other | Attending: Emergency Medicine | Admitting: Emergency Medicine

## 2016-11-14 ENCOUNTER — Emergency Department: Payer: Medicaid Other

## 2016-11-14 DIAGNOSIS — W010XXA Fall on same level from slipping, tripping and stumbling without subsequent striking against object, initial encounter: Secondary | ICD-10-CM | POA: Insufficient documentation

## 2016-11-14 DIAGNOSIS — Y999 Unspecified external cause status: Secondary | ICD-10-CM | POA: Diagnosis not present

## 2016-11-14 DIAGNOSIS — R531 Weakness: Secondary | ICD-10-CM | POA: Diagnosis not present

## 2016-11-14 DIAGNOSIS — Y939 Activity, unspecified: Secondary | ICD-10-CM | POA: Diagnosis not present

## 2016-11-14 DIAGNOSIS — J45909 Unspecified asthma, uncomplicated: Secondary | ICD-10-CM | POA: Diagnosis not present

## 2016-11-14 DIAGNOSIS — Y929 Unspecified place or not applicable: Secondary | ICD-10-CM | POA: Diagnosis not present

## 2016-11-14 DIAGNOSIS — G459 Transient cerebral ischemic attack, unspecified: Secondary | ICD-10-CM | POA: Insufficient documentation

## 2016-11-14 DIAGNOSIS — Z7902 Long term (current) use of antithrombotics/antiplatelets: Secondary | ICD-10-CM | POA: Insufficient documentation

## 2016-11-14 DIAGNOSIS — Z79899 Other long term (current) drug therapy: Secondary | ICD-10-CM | POA: Diagnosis not present

## 2016-11-14 DIAGNOSIS — F17210 Nicotine dependence, cigarettes, uncomplicated: Secondary | ICD-10-CM | POA: Diagnosis not present

## 2016-11-14 DIAGNOSIS — R269 Unspecified abnormalities of gait and mobility: Secondary | ICD-10-CM

## 2016-11-14 DIAGNOSIS — J449 Chronic obstructive pulmonary disease, unspecified: Secondary | ICD-10-CM | POA: Diagnosis not present

## 2016-11-14 DIAGNOSIS — R42 Dizziness and giddiness: Secondary | ICD-10-CM | POA: Diagnosis present

## 2016-11-14 DIAGNOSIS — W19XXXA Unspecified fall, initial encounter: Secondary | ICD-10-CM

## 2016-11-14 HISTORY — DX: Polyneuropathy, unspecified: G62.9

## 2016-11-14 HISTORY — DX: Depression, unspecified: F32.A

## 2016-11-14 HISTORY — DX: Major depressive disorder, single episode, unspecified: F32.9

## 2016-11-14 LAB — COMPREHENSIVE METABOLIC PANEL
ALK PHOS: 68 U/L (ref 38–126)
ALT: 18 U/L (ref 14–54)
AST: 26 U/L (ref 15–41)
Albumin: 4.3 g/dL (ref 3.5–5.0)
Anion gap: 9 (ref 5–15)
BILIRUBIN TOTAL: 0.5 mg/dL (ref 0.3–1.2)
BUN: 14 mg/dL (ref 6–20)
CALCIUM: 9.2 mg/dL (ref 8.9–10.3)
CO2: 27 mmol/L (ref 22–32)
Chloride: 105 mmol/L (ref 101–111)
Creatinine, Ser: 0.74 mg/dL (ref 0.44–1.00)
GFR calc non Af Amer: >60 mL/min (ref >60)
Glucose, Bld: 89 mg/dL (ref 65–99)
Potassium: 4.3 mmol/L (ref 3.5–5.1)
SODIUM: 141 mmol/L (ref 135–145)
TOTAL PROTEIN: 6.8 g/dL (ref 6.5–8.1)

## 2016-11-14 LAB — CBC
HEMATOCRIT: 36.8 % (ref 35.0–47.0)
HEMOGLOBIN: 12.8 g/dL (ref 12.0–16.0)
MCH: 34.4 pg — AB (ref 26.0–34.0)
MCHC: 34.7 g/dL (ref 32.0–36.0)
MCV: 99.1 fL (ref 80.0–100.0)
Platelets: 197 10*3/uL (ref 150–440)
RBC: 3.71 MIL/uL — AB (ref 3.80–5.20)
RDW: 14.1 % (ref 11.5–14.5)
WBC: 5.2 10*3/uL (ref 3.6–11.0)

## 2016-11-14 LAB — DIFFERENTIAL
Basophils Absolute: 0.1 10*3/uL (ref 0–0.1)
Basophils Relative: 1 %
EOS PCT: 2 %
Eosinophils Absolute: 0.1 10*3/uL (ref 0–0.7)
LYMPHS ABS: 1.2 10*3/uL (ref 1.0–3.6)
LYMPHS PCT: 24 %
MONO ABS: 0.5 10*3/uL (ref 0.2–0.9)
Monocytes Relative: 9 %
NEUTROS ABS: 3.3 10*3/uL (ref 1.4–6.5)
Neutrophils Relative %: 64 %

## 2016-11-14 LAB — URINE DRUG SCREEN, QUALITATIVE (ARMC ONLY)
AMPHETAMINES, UR SCREEN: NOT DETECTED
BENZODIAZEPINE, UR SCRN: NOT DETECTED
Barbiturates, Ur Screen: NOT DETECTED
Cannabinoid 50 Ng, Ur ~~LOC~~: NOT DETECTED
Cocaine Metabolite,Ur ~~LOC~~: NOT DETECTED
MDMA (Ecstasy)Ur Screen: NOT DETECTED
Methadone Scn, Ur: NOT DETECTED
Opiate, Ur Screen: NOT DETECTED
PHENCYCLIDINE (PCP) UR S: NOT DETECTED
Tricyclic, Ur Screen: NOT DETECTED

## 2016-11-14 LAB — ETHANOL: Alcohol, Ethyl (B): <5 mg/dL (ref <5)

## 2016-11-14 LAB — URINALYSIS, ROUTINE W REFLEX MICROSCOPIC
BACTERIA UA: NONE SEEN
BILIRUBIN URINE: NEGATIVE
Glucose, UA: NEGATIVE mg/dL
Ketones, ur: NEGATIVE mg/dL
Leukocytes, UA: NEGATIVE
NITRITE: NEGATIVE
PH: 8 (ref 5.0–8.0)
Protein, ur: NEGATIVE mg/dL
Specific Gravity, Urine: 1.004 — ABNORMAL LOW (ref 1.005–1.030)

## 2016-11-14 LAB — PROTIME-INR
INR: 0.98
Prothrombin Time: 13 seconds (ref 11.4–15.2)

## 2016-11-14 LAB — PHENYTOIN LEVEL, TOTAL: Phenytoin Lvl: 15 ug/mL (ref 10.0–20.0)

## 2016-11-14 LAB — GLUCOSE, CAPILLARY: GLUCOSE-CAPILLARY: 84 mg/dL (ref 65–99)

## 2016-11-14 LAB — APTT: aPTT: 30 seconds (ref 24–36)

## 2016-11-14 MED ORDER — CLOPIDOGREL BISULFATE 75 MG PO TABS
75.0000 mg | ORAL_TABLET | Freq: Once | ORAL | Status: AC
  Administered 2016-11-14: 75 mg via ORAL
  Filled 2016-11-14: qty 1

## 2016-11-14 MED ORDER — CLOPIDOGREL BISULFATE 75 MG PO TABS: 75.0000 mg | ORAL_TABLET | Freq: Every day | ORAL | 0 refills | Status: AC

## 2016-11-14 NOTE — ED Triage Notes (Signed)
Per EMS report, patient felt she was "walking sideways" after getting dressed after shower and then fell backwards. Fall was witnessed by roommate who denied patient lost consciousness. Patient c/o bilateral knee pain mostly, but also head pain, back pain, and bilateral hip pain. Patient walks with a walker. Patient states she has fallen several times.

## 2016-11-14 NOTE — ED Notes (Signed)
Dr. Mable Paris informed of patient's asymmetrical face. Patient states right side facial droop has been present since her previous stroke.

## 2016-11-14 NOTE — ED Provider Notes (Addendum)
[facility] Emergency Department Provider Note  ____________________________________________   First MD Initiated Contact with Patient 11/14/16 1219     (approximate)  I have reviewed the triage vital signs and the nursing notes.   HISTORY  Chief Complaint Fall    HPI Marcia Brown is a 61 y.o. female who comes to the emergency department via EMS after she fell in her home today. She says she went to the bathroom and when she got out she began "walking sideways" and that everything was spinning.She fell backwards and struck her head. She denies loss of consciousness. She denies numbness or weakness at this time. She says she's had a stroke in the past and she has residual right facial droop. She does have a history of seizure disorder but she does not think she had a seizure today. She denies chest pain shortness of breath abdominal pain nausea or vomiting. She denies dysuria frequency or hesitancy. She reports compliance with all of her medications.   Past Medical History:  Diagnosis Date  . Anxiety   . Asthma   . COPD (chronic obstructive pulmonary disease) ([place])   . Depression   . High cholesterol   . Polyneuropathy   . Seizures ([place])   . Sleep apnea     Patient Active Problem List   Diagnosis Date Noted  . Seizure ([place]) 02/03/2015    Past Surgical History:  Procedure Laterality Date  . ESOPHAGEAL DILATION    . FOOT SURGERY Right     Prior to Admission medications   Medication Sig Start Date End Date Taking? Authorizing Provider  acetaminophen (MAPAP) 500 MG tablet Take 500 mg by mouth every 6 (six) hours as needed for mild pain or moderate pain.   Yes [provider]  alum & mag hydroxide-simeth (ANTACID LIQUID) 200-200-20 MG/5ML suspension Take 30 mLs by mouth every 6 (six) hours as needed for indigestion or heartburn.   Yes [provider]  atorvastatin (LIPITOR) 10 MG tablet Take 10 mg by mouth at bedtime.    Yes  [provider]  cholecalciferol (VITAMIN D) 1000 units tablet Take 2,000 Units by mouth daily. Give with Vitamin D3 400 units   Yes [provider]  cholecalciferol (VITAMIN D) 400 UNITS TABS tablet Take 400 Units by mouth daily. Give with Vitamin D3 2000 units   Yes [provider]  clonazePAM (KLONOPIN) 0.5 MG tablet Take 0.5 mg by mouth 3 (three) times daily.    Yes [provider]  cyanocobalamin 500 MCG tablet Take 500 mcg by mouth daily.   Yes [provider]  diclofenac sodium (VOLTAREN) 1 % GEL Apply 4 g topically 3 (three) times daily. Apply to both knees   Yes [provider]  DULoxetine (CYMBALTA) 60 MG capsule Take 60 mg by mouth daily.   Yes [provider]  ferrous sulfate 325 (65 FE) MG tablet Take 325 mg by mouth 3 (three) times daily with meals.   Yes [provider]  gabapentin (NEURONTIN) 100 MG capsule Take 200 mg by mouth 2 (two) times daily.   Yes [provider]  lacosamide 100 MG TABS Take 1 tablet (100 mg total) by mouth 2 (two) times daily. 02/04/15  Yes Hillary Bow, MD  lamoTRIgine (LAMICTAL) 200 MG tablet Take 200-400 mg by mouth 2 (two) times daily. Pt takes 2 tablets (400 mg) in the morning on Monday, Wednesday and Friday and only 1 tablet (200 mg) bid on all other days  in the morning.   Yes [provider]  levETIRAcetam (KEPPRA) 1000 MG tablet Take 1 tablet (1,000 mg total) by mouth 2 (two) times daily. Patient taking differently: Take 1,500-2,000 mg by mouth daily. Take 2000 mg by mouth in the morning Tuesday, Thursday, Saturday and Sunday and 1500 mg by mouth every Monday, Wednesday and Friday morning. 11/26/15  Yes Daymon Larsen, MD  loperamide (IMODIUM) 2 MG capsule Take 4 mg by mouth as needed for diarrhea or loose stools.   Yes [provider]  magnesium hydroxide (MILK OF MAGNESIA) 400 MG/5ML suspension Take 30 mLs by mouth daily as needed for mild constipation  or moderate constipation.   Yes [provider]  meloxicam (MOBIC) 15 MG tablet Take 1 tablet (15 mg total) by mouth daily. 12/23/15  Yes Cuthriell, Charline Bills, PA-C  mometasone-formoterol (DULERA) 100-5 MCG/ACT AERO Inhale 2 puffs into the lungs 2 (two) times daily.   Yes [provider]  mupirocin ointment (BACTROBAN) 2 % Apply 1 application topically 2 (two) times daily. 10/30/16  Yes Edrick Kins, DPM  Neomycin-Bacitracin-Polymyxin (TRIPLE ANTIBIOTIC) [phone] OINT Apply topically daily.   Yes [provider]  pantoprazole (PROTONIX) 40 MG tablet Take 40 mg by mouth daily before breakfast.    Yes [provider]  phenytoin (DILANTIN) 100 MG ER capsule Take 100 mg by mouth 2 (two) times daily.    Yes [provider]  acetaminophen (TYLENOL) 325 MG tablet Take 325 mg by mouth every 4 (four) hours as needed for mild pain. Take 2 tablets (650 mg) by mouth every 4 hours daily as needed    [provider]  albuterol (PROVENTIL HFA;VENTOLIN HFA) 108 (90 BASE) MCG/ACT inhaler Inhale 2 puffs into the lungs every 4 (four) hours as needed for wheezing or shortness of breath.    [provider]  barrier cream (NON-SPECIFIED) CREA Apply 1 application topically as needed. Apply topically after toileting as needed to prevent skin breakdown    [provider]  clopidogrel (PLAVIX) 75 MG tablet Take 1 tablet (75 mg total) by mouth daily. 11/14/16 11/14/17  Darel Hong, MD  diphenhydrAMINE (BENADRYL) 25 MG tablet Take 25 mg by mouth every 6 (six) hours as needed for allergies.    [provider]  guaiFENesin (ROBITUSSIN) 100 MG/5ML SOLN Take 15 mLs by mouth every 4 (four) hours as needed for cough or to loosen phlegm.    [provider]  NONFORMULARY OR COMPOUNDED ITEM Apply 1-2 g topically 4 (four) times daily. 04/03/16   Edrick Kins, DPM  ondansetron (ZOFRAN ODT) 4 MG disintegrating tablet Take 1 tablet (4 mg total) by  mouth every 8 (eight) hours as needed for nausea or vomiting. 07/21/16   Earleen Newport, MD  Powders (GOLD BOND BABY POWDER EX) Apply topically daily as needed. For dry itchy skin    [provider]  pyrithione zinc (HEAD AND SHOULDERS) 1 % shampoo Apply topically daily as needed for itching.    [provider]  traMADol (ULTRAM) 50 MG tablet Take 50 mg by mouth 2 (two) times daily as needed for moderate pain.    [provider]    Allergies Ciprofloxacin and Aspirin  No family history on file.  Social History Social History  Substance Use Topics  . Smoking status: Current Every Day Smoker    Packs/day: 0.50    Types: Cigarettes  . Smokeless tobacco: Never Used  . Alcohol use No    Review of  Systems Constitutional: No fever/chills Eyes: No visual changes. ENT: No sore throat. Cardiovascular: Denies chest pain. Respiratory: Denies shortness of breath. Gastrointestinal: No abdominal pain.  No nausea, no vomiting.  No diarrhea.  No constipation. Genitourinary: Negative for dysuria. Musculoskeletal: Negative for back pain. Skin: Negative for rash. Neurological:Negative for headache   ____________________________________________   PHYSICAL EXAM:  VITAL SIGNS: ED Triage Vitals  Enc Vitals Group     BP 11/14/16 1143 137/72     Pulse Rate 11/14/16 1143 (!) 56     Resp 11/14/16 1143 18     Temp 11/14/16 1143 98.6 F (37 C)     Temp Source 11/14/16 1143 Oral     SpO2 11/14/16 1143 97 %     Weight 11/14/16 1143 200 lb (90.7 kg)     Height 11/14/16 1143 5\' 3"  (1.6 m)     Head Circumference --      Peak Flow --      Pain Score 11/14/16 1144 6     Pain Loc --      Pain Edu? --      Excl. in [place]? --     Constitutional: Alert and oriented x 4 well appearing nontoxic no diaphoresis speaks in full, clear sentences Eyes: PERRL EOMI. Head: Atraumatic. Nose: No congestion/rhinnorhea. Mouth/Throat: No trismus Neck: No stridor.     Cardiovascular: Bradycardic rate, regular rhythm. Grossly normal heart sounds.  Good peripheral circulation. Respiratory: Normal respiratory effort.  No retractions. Lungs CTAB and moving good air Gastrointestinal: Soft nontender Musculoskeletal: No lower extremity edema   Neurologic:  Normal speech and language.  Cranial nerves II through XII intact aside from left facial droop sparing the forehead No pronator drift 5 out of 5 grips biceps triceps 5 out of 5 hip flexion and hip extension plantar flexion dorsiflexion 2+ DTRs no ankle clonus Skin:  Skin is warm, dry and intact. No rash noted. Psychiatric: Mood and affect are normal. Speech and behavior are normal.    ____________________________________________   DIFFERENTIAL  Stroke, TIA, intracerebral hemorrhage, vertigo, dysrhythmia ____________________________________________   LABS (all labs ordered are listed, but only abnormal results are displayed)  Labs Reviewed  CBC - Abnormal; Notable for the following:       Result Value   RBC 3.71 (*)    MCH 34.4 (*)    All other components within normal limits  URINALYSIS, ROUTINE W REFLEX MICROSCOPIC - Abnormal; Notable for the following:    Color, Urine STRAW (*)    APPearance CLEAR (*)    Specific Gravity, Urine 1.004 (*)    Hgb urine dipstick SMALL (*)    Squamous Epithelial / LPF 0-5 (*)    All other components within normal limits  ETHANOL  PROTIME-INR  APTT  DIFFERENTIAL  COMPREHENSIVE METABOLIC PANEL  GLUCOSE, CAPILLARY  PHENYTOIN LEVEL, TOTAL  URINE DRUG SCREEN, QUALITATIVE ([facility] ONLY)    No signs of infection __________________________________________  EKG   ____________________________________________  RADIOLOGY  Head CT with old disease but no acute pathology ____________________________________________   PROCEDURES  Procedure(s) performed: no  Procedures  Critical Care performed: no  Observation:  no ____________________________________________   INITIAL IMPRESSION / ASSESSMENT AND PLAN / ED COURSE  Pertinent labs & imaging results that were available during my care of the patient were reviewed by me and considered in my medical decision making (see chart for details).  Unclear what actually happened to the patient today. She clearly has right facial droop although she says this may be old,  however we have a note in our system from 2 weeks ago indicating "no gross focal neurological deficits noted".     ----------------------------------------- 2:31 PM on 11/14/2016 -----------------------------------------  I offered the patient inpatient admission for workup of her possible transient ischemic attack, however the patient and her niece at bedside who is power of attorney would prefer to go home which I think is reasonable. She lives at the [place] and has doctors making house calls scheduled to come see her tomorrow. The patient is allergic to aspirin so I discussed this with Dr. Doy Mince who recommends 75 mg of Plavix daily instead. I appreciate that the patient may have had a transient ischemic attack, however at this point she is medically stable for outpatient workup.  ____________________________________________   FINAL CLINICAL IMPRESSION(S) / ED DIAGNOSES  Final diagnoses:  Transient cerebral ischemia, unspecified type  Weakness  Fall, initial encounter      NEW MEDICATIONS STARTED DURING THIS VISIT:  Discharge Medication List as of 11/14/2016  2:34 PM    START taking these medications   Details  clopidogrel (PLAVIX) 75 MG tablet Take 1 tablet (75 mg total) by mouth daily., Starting Wed 11/14/2016, Until Thu 11/14/2017, Print         Note:  This document was prepared using Dragon voice recognition software and may include unintentional dictation errors.     Darel Hong, MD 11/14/16 Doran Heater    Darel Hong, MD 11/14/16 [phone]

## 2016-11-14 NOTE — ED Notes (Signed)
Patient is verbal, oriented x4. Patient's niece who is her POA is at bedside.

## 2016-11-14 NOTE — Consult Note (Signed)
Referring Physician: Rifenbark    Chief Complaint: Difficulty with gait  HPI: Marcia Brown is an 61 y.o. female who has a difficult time providing a consistent history.  It appears that at some time today she began to have more difficulty walking.  Felt as if she was going to the right.  [address] and it is unclear if this fall was in the bath or not.  Patient had difficulty getting back up which is unusual for her.  From her record it seems that she has a history of falls.  Patient reports that it was noted that she was walking off balance and EMS was called.   Patient does not recall having had a seizure.    Date last known well: Date: 11/14/2016 Time last known well: Unable to determine tPA Given: No: Unable to determine LKW, h/o ICH  Past Medical History:  Diagnosis Date  . Anxiety   . Asthma   . COPD (chronic obstructive pulmonary disease) ([place])   . Depression   . High cholesterol   . Polyneuropathy   . Seizures ([place])   . Sleep apnea     Past Surgical History:  Procedure Laterality Date  . ESOPHAGEAL DILATION    . FOOT SURGERY Right     Family history: Sister with epilepsy.  Mother with history of a stroke.  Father with CAD s/p MI  Social History:  reports that she has been smoking Cigarettes.  She has been smoking about 0.50 packs per day. She has never used smokeless tobacco. She reports that she does not drink alcohol or use drugs.  Allergies:  Allergies  Allergen Reactions  . Ciprofloxacin Other (See Comments)    Other Reaction: Lowered seizure threshold  . Aspirin Other (See Comments)    Reaction: Unknown    Medications: I have reviewed the patient's current medications. Prior to Admission:  Prior to Admission medications   Medication Sig Start Date End Date Taking? Authorizing Provider  acetaminophen (TYLENOL) 500 MG tablet Take 500 mg by mouth every 8 (eight) hours as needed for mild pain.     [provider]  albuterol (PROVENTIL HFA;VENTOLIN HFA) 108  (90 BASE) MCG/ACT inhaler Inhale 2 puffs into the lungs every 4 (four) hours as needed for wheezing or shortness of breath.    [provider]  atorvastatin (LIPITOR) 10 MG tablet Take 10 mg by mouth at bedtime.     [provider]  cholecalciferol (VITAMIN D) 400 UNITS TABS tablet Take 400 Units by mouth daily.     [provider]  clonazePAM (KLONOPIN) 0.5 MG tablet Take 0.5 mg by mouth 3 (three) times daily.     [provider]  cyanocobalamin 500 MCG tablet Take 500 mcg by mouth daily.    [provider]  DULoxetine (CYMBALTA) 60 MG capsule Take 60 mg by mouth daily.    [provider]  ferrous sulfate 325 (65 FE) MG tablet Take 325 mg by mouth 3 (three) times daily with meals.    [provider]  folic acid (FOLVITE) 1 MG tablet Take 1 mg by mouth daily.    [provider]  gabapentin (NEURONTIN) 100 MG capsule Take 200 mg by mouth 2 (two) times daily.    [provider]  lacosamide 100 MG TABS Take 1 tablet (100 mg total) by mouth 2 (two) times daily. 02/04/15   Hillary Bow, MD  lamoTRIgine (LAMICTAL) 100 MG tablet Take 200 mg by mouth 2 (two) times daily.  [provider]  levETIRAcetam (KEPPRA) 1000 MG tablet Take 1 tablet (1,000 mg total) by mouth 2 (two) times daily. Patient taking differently: Take 1,000-2,000 mg by mouth 2 (two) times daily. Take 2 tablets (2000 mg) by mouth in the morning and 1 tablet (1000 mg) by mouth at bedtime. 11/26/15   Daymon Larsen, MD  meloxicam (MOBIC) 15 MG tablet Take 1 tablet (15 mg total) by mouth daily. 12/23/15   Cuthriell, Charline Bills, PA-C  meloxicam (MOBIC) 15 MG tablet Take 1 mg by mouth daily. 09/20/16   Edrick Kins, DPM  mometasone-formoterol (DULERA) 100-5 MCG/ACT AERO Inhale 2 puffs into the lungs 2 (two) times daily.    [provider]  mupirocin ointment (BACTROBAN) 2 % Apply 1 application topically 2 (two) times daily. 10/30/16   Edrick Kins, DPM  NONFORMULARY OR COMPOUNDED ITEM Apply 1-2 g topically 4 (four) times daily. 04/03/16   Edrick Kins, DPM  ondansetron (ZOFRAN ODT) 4 MG disintegrating tablet Take 1 tablet (4 mg total) by mouth every 8 (eight) hours as needed for nausea or vomiting. 07/21/16   Earleen Newport, MD  ondansetron (ZOFRAN) 4 MG tablet Take 4 mg by mouth every 4 (four) hours as needed for nausea or vomiting.     [provider]  oxyCODONE-acetaminophen (PERCOCET/ROXICET) 5-325 MG tablet Take 1 tablet by mouth every 4 (four) hours as needed for severe pain. 09/20/16   Edrick Kins, DPM  pantoprazole (PROTONIX) 40 MG tablet Take 40 mg by mouth daily before breakfast.     [provider]  phenytoin (DILANTIN) 100 MG ER capsule Take 100 mg by mouth 2 (two) times daily.     [provider]  Polyethyl Glycol-Propyl Glycol (SYSTANE) 0.4-0.3 % SOLN Place 2 drops into both eyes 2 (two) times daily.     [provider]  traMADol (ULTRAM) 50 MG tablet Take 50 mg by mouth 2 (two) times daily as needed for moderate pain.    [provider]     ROS: History obtained from the patient  General ROS: negative for - chills, fatigue, fever, night sweats, weight gain or weight loss Psychological ROS: negative for - behavioral disorder, hallucinations, memory difficulties, mood swings or suicidal ideation Ophthalmic ROS: negative for - blurry vision, double vision, eye pain or loss of vision ENT ROS: negative for - epistaxis, nasal discharge, oral lesions, sore throat, tinnitus or vertigo Allergy and Immunology ROS: negative for - hives or itchy/watery eyes Hematological and Lymphatic ROS: negative for - bleeding problems, bruising or swollen lymph nodes Endocrine ROS: negative for - galactorrhea, hair pattern changes, polydipsia/polyuria or temperature intolerance Respiratory ROS: negative for - cough, hemoptysis, shortness of breath or wheezing Cardiovascular ROS: negative for  - chest pain, dyspnea on exertion, edema or irregular heartbeat Gastrointestinal ROS: negative for - abdominal pain, diarrhea, hematemesis, nausea/vomiting or stool incontinence Genito-Urinary ROS: negative for - dysuria, hematuria, incontinence or urinary frequency/urgency Musculoskeletal ROS: negative for - joint swelling or muscular weakness Neurological ROS: as noted in HPI Dermatological ROS: negative for rash and skin lesion changes  Physical Examination: Blood pressure 137/72, pulse (!) 56, temperature 98.6 F (37 C), temperature source Oral, resp. rate 18, height 5\' 3"  (1.6 m), weight 90.7 kg (200 lb), SpO2 97 %.  HEENT-  Normocephalic, no lesions, without obvious abnormality.  Normal external eye and conjunctiva.  Normal TM's bilaterally.  Normal auditory canals and external ears. Normal external nose, mucus membranes and septum.  Normal  pharynx. Cardiovascular- S1, S2 normal, pulses palpable throughout   Lungs- chest clear, no wheezing, rales, normal symmetric air entry Abdomen- soft, non-tender; bowel sounds normal; no masses,  no organomegaly Extremities- no edema Lymph-no adenopathy palpable Musculoskeletal-no joint tenderness, deformity or swelling Skin-warm and dry, no hyperpigmentation, vitiligo, or suspicious lesions  Neurological Examination   Mental Status: Alert, oriented, thought content appropriate.  Speech fluent without evidence of aphasia.  Able to follow 3 step commands without difficulty. Cranial Nerves: II: Discs flat bilaterally; Visual fields grossly normal, pupils equal, round, reactive to light and accommodation III,IV, VI: right ptosis, extra-ocular motions intact bilaterally V,VII: mild right facial droop, facial light touch sensation decreased on the right VIII: hearing normal bilaterally IX,X: gag reflex present XI: bilateral shoulder shrug XII: midline tongue extension Motor: Right : Upper extremity   5/5    Left:     Upper extremity   5/5  Lower  extremity   5/5     Lower extremity   5/5 Tone and bulk:normal tone throughout; no atrophy noted Sensory: Pinprick and light touch intact throughout, bilaterally.  Left neglect on the face. Deep Tendon Reflexes: 2+ and symmetric with absent AJ's bilaterally Plantars: Right: mute   Left: mute Cerebellar: normal finger-to-nose, normal rapid alternating movements and normal heel-to-shin test Gait: not tested due to safety concerns    Laboratory Studies:  Basic Metabolic Panel: No results for input(s): NA, K, CL, CO2, GLUCOSE, BUN, CREATININE, CALCIUM, MG, PHOS in the last 168 hours.  Liver Function Tests: No results for input(s): AST, ALT, ALKPHOS, BILITOT, PROT, ALBUMIN in the last 168 hours. No results for input(s): LIPASE, AMYLASE in the last 168 hours. No results for input(s): AMMONIA in the last 168 hours.  CBC:  Recent Labs Lab 11/14/16 1225  WBC 5.2  NEUTROABS 3.3  HGB 12.8  HCT 36.8  MCV 99.1  PLT 197    Cardiac Enzymes: No results for input(s): CKTOTAL, CKMB, CKMBINDEX, TROPONINI in the last 168 hours.  BNP: Invalid input(s): POCBNP  CBG:  Recent Labs Lab 11/14/16 1244  GLUCAP 51    Microbiology: Results for orders placed or performed during the hospital encounter of 12/14/15  Urine culture     Status: Abnormal   Collection Time: 12/15/15 12:13 AM  Result Value Ref Range Status   Specimen Description URINE, RANDOM  Final   Special Requests Normal  Final   Culture MULTIPLE SPECIES PRESENT, SUGGEST RECOLLECTION (A)  Final   Report Status 12/16/2015 FINAL  Final    Coagulation Studies:  Recent Labs  11/14/16 1225  LABPROT 13.0  INR 0.98    Urinalysis: No results for input(s): COLORURINE, LABSPEC, PHURINE, GLUCOSEU, HGBUR, BILIRUBINUR, KETONESUR, PROTEINUR, UROBILINOGEN, NITRITE, LEUKOCYTESUR in the last 168 hours.  Invalid input(s): APPERANCEUR  Lipid Panel: No results found for: CHOL, TRIG, HDL, CHOLHDL, VLDL, LDLCALC  HgbA1C: No results  found for: HGBA1C  Urine Drug Screen:     Component Value Date/Time   LABOPIA NONE DETECTED 02/25/2016 1640   LABOPIA NONE DETECTED 11/05/2010 2103   COCAINSCRNUR NONE DETECTED 02/25/2016 1640   LABBENZ NONE DETECTED 02/25/2016 1640   LABBENZ POSITIVE (A) 11/05/2010 2103   AMPHETMU NONE DETECTED 02/25/2016 1640   AMPHETMU NONE DETECTED 11/05/2010 2103   THCU NONE DETECTED 02/25/2016 1640   THCU NONE DETECTED 11/05/2010 2103   LABBARB NONE DETECTED 02/25/2016 1640   LABBARB  11/05/2010 2103    NONE DETECTED        DRUG SCREEN FOR MEDICAL PURPOSES ONLY.  IF CONFIRMATION IS NEEDED FOR ANY PURPOSE, NOTIFY LAB WITHIN 5 DAYS.        LOWEST DETECTABLE LIMITS FOR URINE DRUG SCREEN Drug Class       Cutoff (ng/mL) Amphetamine      1000 Barbiturate      200 Benzodiazepine   785 Tricyclics       885 Opiates          300 Cocaine          300 THC              50    Alcohol Level: No results for input(s): ETH in the last 168 hours.   Imaging: Ct Head Code Stroke Wo Contrast`  Result Date: 11/14/2016 CLINICAL DATA:  Code stroke. Dizziness. Bilateral lower extremity weakness. Fell in the shower 1 hour ago. Personal history of seizures. EXAM: CT HEAD WITHOUT CONTRAST TECHNIQUE: Contiguous axial images were obtained from the base of the skull through the vertex without intravenous contrast. COMPARISON:  CT head without contrast 02/25/2016 FINDINGS: Brain: The encephalomalacia involving the right temporal tip is again seen. There is no significant interval change. No acute infarct, hemorrhage, or mass lesion is present. The basal ganglia are intact. Insular ribbon is normal bilaterally. No significant white matter disease present. The brainstem and cerebellum are within normal limits. Vascular: Atherosclerotic calcifications are present within the cavernous internal carotid artery's bilaterally. There is no hyperdense vessel. Skull: Remote right temporal and lateral orbit fractures are noted. A  remote right zygomatic arch fracture is present. The calvarium is otherwise intact. No focal lytic or blastic lesions are present. Sinuses/Orbits: The paranasal sinuses and mastoid air cells are clear. Bilateral lens replacements are present. The globes and orbits are within normal limits. ASPECTS [facility] Stroke Program Early CT Score) - Ganglionic level infarction (caudate, lentiform nuclei, internal capsule, insula, M1-M3 cortex): 7/7 - Supraganglionic infarction (M4-M6 cortex): 3/3 Total score (0-10 with 10 being normal): 10/10 IMPRESSION: 1. Stable right temporal encephalomalacia. 2. No acute intracranial abnormality. 3. Remote right temporal, lateral orbit, and zygomatic arch fractures are stable. 4. ASPECTS is 10/10 These results will be called to the ordering clinician or representative by the Radiologist Assistant, and communication documented in the PACS or zVision Dashboard. Electronically Signed   By: San Morelle M.D.   On: 11/14/2016 12:47    Assessment: 61 y.o. female presenting with difficulty with gait of acute onset.  Very difficult historian.  Due to inability to give a cogent history can not rule out the possibility of a seizure.  Patient on five anticonvulsants.  Does not appear to have had any recent changes in AED therapy.  Can not rule out the possibility of stroke as well.  Head CT reviewed and shows no acute changes.  Further work up recommended.    Stroke Risk Factors - hyperlipidemia  Plan: 1. Dilantin level 2. ASA 81mg  daily 3. MRI of the brain without contrast with stroke work up to be initiated if indicative of an acute event.     Alexis Goodell, MD Neurology [phone] 11/14/2016, 1:11 PM

## 2016-11-14 NOTE — ED Notes (Signed)
In and out cath for clear yellow urine.  600 ml removed from bladder.

## 2016-11-14 NOTE — Discharge Instructions (Signed)
Please follow-up with your primary care physician tomorrow as scheduled and please make an appointment to see your neurologist within a week for recheck. Please take Plavix every day as prescribed. Return to the emergency department for any concerns such as numbness, weakness, if he fall again, or for any other concerns.  It was a pleasure to take care of you today, and thank you for coming to our emergency department.  If you have any questions or concerns before leaving please ask the nurse to grab me and I'm more than happy to go through your aftercare instructions again.  If you were prescribed any opioid pain medication today such as Norco, Vicodin, Percocet, morphine, hydrocodone, or oxycodone please make sure you do not drive when you are taking this medication as it can alter your ability to drive safely.  If you have any concerns once you are home that you are not improving or are in fact getting worse before you can make it to your follow-up appointment, please do not hesitate to call [phone] and come back for further evaluation.  Darel Hong MD  Results for orders placed or performed during the hospital encounter of 11/14/16  Ethanol  Result Value Ref Range   Alcohol, Ethyl (B) <5 <5 mg/dL  Protime-INR  Result Value Ref Range   Prothrombin Time 13.0 11.4 - 15.2 seconds   INR 0.98   APTT  Result Value Ref Range   aPTT 30 24 - 36 seconds  CBC  Result Value Ref Range   WBC 5.2 3.6 - 11.0 K/uL   RBC 3.71 (L) 3.80 - 5.20 MIL/uL   Hemoglobin 12.8 12.0 - 16.0 g/dL   HCT 36.8 35.0 - 47.0 %   MCV 99.1 80.0 - 100.0 fL   MCH 34.4 (H) 26.0 - 34.0 pg   MCHC 34.7 32.0 - 36.0 g/dL   RDW 14.1 11.5 - 14.5 %   Platelets 197 150 - 440 K/uL  Differential  Result Value Ref Range   Neutrophils Relative % 64 %   Neutro Abs 3.3 1.4 - 6.5 K/uL   Lymphocytes Relative 24 %   Lymphs Abs 1.2 1.0 - 3.6 K/uL   Monocytes Relative 9 %   Monocytes Absolute 0.5 0.2 - 0.9 K/uL   Eosinophils Relative 2  %   Eosinophils Absolute 0.1 0 - 0.7 K/uL   Basophils Relative 1 %   Basophils Absolute 0.1 0 - 0.1 K/uL  Comprehensive metabolic panel  Result Value Ref Range   Sodium 141 135 - 145 mmol/L   Potassium 4.3 3.5 - 5.1 mmol/L   Chloride 105 101 - 111 mmol/L   CO2 27 22 - 32 mmol/L   Glucose, Bld 89 65 - 99 mg/dL   BUN 14 6 - 20 mg/dL   Creatinine, Ser 0.74 0.44 - 1.00 mg/dL   Calcium 9.2 8.9 - 10.3 mg/dL   Total Protein 6.8 6.5 - 8.1 g/dL   Albumin 4.3 3.5 - 5.0 g/dL   AST 26 15 - 41 U/L   ALT 18 14 - 54 U/L   Alkaline Phosphatase 68 38 - 126 U/L   Total Bilirubin 0.5 0.3 - 1.2 mg/dL   GFR calc non Af Amer >60 >60 mL/min   GFR calc Af Amer >60 >60 mL/min   Anion gap 9 5 - 15  Urinalysis, Routine w reflex microscopic  Result Value Ref Range   Color, Urine STRAW (A) YELLOW   APPearance CLEAR (A) CLEAR   Specific Gravity, Urine  1.004 (L) 1.005 - 1.030   pH 8.0 5.0 - 8.0   Glucose, UA NEGATIVE NEGATIVE mg/dL   Hgb urine dipstick SMALL (A) NEGATIVE   Bilirubin Urine NEGATIVE NEGATIVE   Ketones, ur NEGATIVE NEGATIVE mg/dL   Protein, ur NEGATIVE NEGATIVE mg/dL   Nitrite NEGATIVE NEGATIVE   Leukocytes, UA NEGATIVE NEGATIVE   RBC / HPF 0-5 0 - 5 RBC/hpf   WBC, UA 0-5 0 - 5 WBC/hpf   Bacteria, UA NONE SEEN NONE SEEN   Squamous Epithelial / LPF 0-5 (A) NONE SEEN  Glucose, capillary  Result Value Ref Range   Glucose-Capillary 84 65 - 99 mg/dL  Phenytoin level, total  Result Value Ref Range   Phenytoin Lvl 15.0 10.0 - 20.0 ug/mL  Urine Drug Screen, Qualitative ([facility] only)  Result Value Ref Range   Tricyclic, Ur Screen NONE DETECTED NONE DETECTED   Amphetamines, Ur Screen NONE DETECTED NONE DETECTED   MDMA (Ecstasy)Ur Screen NONE DETECTED NONE DETECTED   Cocaine Metabolite,Ur [place] NONE DETECTED NONE DETECTED   Opiate, Ur Screen NONE DETECTED NONE DETECTED   Phencyclidine (PCP) Ur S NONE DETECTED NONE DETECTED   Cannabinoid 50 Ng, Ur  NONE DETECTED NONE DETECTED   Barbiturates,  Ur Screen NONE DETECTED NONE DETECTED   Benzodiazepine, Ur Scrn NONE DETECTED NONE DETECTED   Methadone Scn, Ur NONE DETECTED NONE DETECTED   Ct Head Code Stroke Wo Contrast`  Result Date: 11/14/2016 CLINICAL DATA:  Code stroke. Dizziness. Bilateral lower extremity weakness. Fell in the shower 1 hour ago. Personal history of seizures. EXAM: CT HEAD WITHOUT CONTRAST TECHNIQUE: Contiguous axial images were obtained from the base of the skull through the vertex without intravenous contrast. COMPARISON:  CT head without contrast 02/25/2016 FINDINGS: Brain: The encephalomalacia involving the right temporal tip is again seen. There is no significant interval change. No acute infarct, hemorrhage, or mass lesion is present. The basal ganglia are intact. Insular ribbon is normal bilaterally. No significant white matter disease present. The brainstem and cerebellum are within normal limits. Vascular: Atherosclerotic calcifications are present within the cavernous internal carotid artery's bilaterally. There is no hyperdense vessel. Skull: Remote right temporal and lateral orbit fractures are noted. A remote right zygomatic arch fracture is present. The calvarium is otherwise intact. No focal lytic or blastic lesions are present. Sinuses/Orbits: The paranasal sinuses and mastoid air cells are clear. Bilateral lens replacements are present. The globes and orbits are within normal limits. ASPECTS [facility] Stroke Program Early CT Score) - Ganglionic level infarction (caudate, lentiform nuclei, internal capsule, insula, M1-M3 cortex): 7/7 - Supraganglionic infarction (M4-M6 cortex): 3/3 Total score (0-10 with 10 being normal): 10/10 IMPRESSION: 1. Stable right temporal encephalomalacia. 2. No acute intracranial abnormality. 3. Remote right temporal, lateral orbit, and zygomatic arch fractures are stable. 4. ASPECTS is 10/10 These results will be called to the ordering clinician or representative by the Radiologist Assistant,  and communication documented in the PACS or zVision Dashboard. Electronically Signed   By: San Morelle M.D.   On: 11/14/2016 12:47

## 2016-11-27 ENCOUNTER — Encounter: Payer: Self-pay | Admitting: Podiatry

## 2016-11-27 ENCOUNTER — Ambulatory Visit (INDEPENDENT_AMBULATORY_CARE_PROVIDER_SITE_OTHER): Payer: Medicaid Other | Admitting: Podiatry

## 2016-11-27 DIAGNOSIS — Z9889 Other specified postprocedural states: Secondary | ICD-10-CM

## 2016-12-01 NOTE — Progress Notes (Signed)
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 DOS: 09/20/16. She reports she fell nodules in the arch of her right foot.  Objective: Skin incisions are well coapted except for central incision dehiscence. No signs of infection.  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 DOS: 09/20/16   Plan of care: 1. Patient was evaluated today.  2. Injection of 0.5 mLs Celestone Soluspan injected into the right midfoot scar tissue incision site. 3. Continue wearing good shoe gear. 4. Return to clinic when necessary.

## 2016-12-31 ENCOUNTER — Other Ambulatory Visit
Admission: RE | Admit: 2016-12-31 | Discharge: 2016-12-31 | Disposition: A | Discharge status: Home / Self Care | Payer: Medicaid Other | Source: Ambulatory Visit | Attending: Neurology | Admitting: Neurology

## 2016-12-31 DIAGNOSIS — G40909 Epilepsy, unspecified, not intractable, without status epilepticus: Secondary | ICD-10-CM | POA: Diagnosis not present

## 2016-12-31 LAB — PHENYTOIN LEVEL, TOTAL: Phenytoin Lvl: 14.5 ug/mL (ref 10.0–20.0)

## 2017-04-27 ENCOUNTER — Encounter: Payer: Self-pay | Admitting: *Deleted

## 2017-04-27 ENCOUNTER — Emergency Department: Payer: Medicaid Other

## 2017-04-27 ENCOUNTER — Emergency Department
Admission: EM | Admit: 2017-04-27 | Discharge: 2017-04-27 | Disposition: A | Discharge status: Home / Self Care | Payer: Medicaid Other | Attending: Emergency Medicine | Admitting: Emergency Medicine

## 2017-04-27 ENCOUNTER — Other Ambulatory Visit: Payer: Self-pay

## 2017-04-27 DIAGNOSIS — W19XXXA Unspecified fall, initial encounter: Secondary | ICD-10-CM | POA: Insufficient documentation

## 2017-04-27 DIAGNOSIS — Z79899 Other long term (current) drug therapy: Secondary | ICD-10-CM | POA: Insufficient documentation

## 2017-04-27 DIAGNOSIS — R51 Headache: Secondary | ICD-10-CM | POA: Diagnosis not present

## 2017-04-27 DIAGNOSIS — J449 Chronic obstructive pulmonary disease, unspecified: Secondary | ICD-10-CM | POA: Insufficient documentation

## 2017-04-27 DIAGNOSIS — F17210 Nicotine dependence, cigarettes, uncomplicated: Secondary | ICD-10-CM | POA: Insufficient documentation

## 2017-04-27 DIAGNOSIS — M25512 Pain in left shoulder: Secondary | ICD-10-CM | POA: Insufficient documentation

## 2017-04-27 DIAGNOSIS — J45909 Unspecified asthma, uncomplicated: Secondary | ICD-10-CM | POA: Insufficient documentation

## 2017-04-27 DIAGNOSIS — M25552 Pain in left hip: Secondary | ICD-10-CM | POA: Diagnosis not present

## 2017-04-27 LAB — COMPREHENSIVE METABOLIC PANEL
ALBUMIN: 3.8 g/dL (ref 3.5–5.0)
ALT: 14 U/L (ref 14–54)
ANION GAP: 6 (ref 5–15)
AST: 19 U/L (ref 15–41)
Alkaline Phosphatase: 80 U/L (ref 38–126)
BILIRUBIN TOTAL: 0.4 mg/dL (ref 0.3–1.2)
BUN: 11 mg/dL (ref 6–20)
CO2: 26 mmol/L (ref 22–32)
Calcium: 8.9 mg/dL (ref 8.9–10.3)
Chloride: 106 mmol/L (ref 101–111)
Creatinine, Ser: 0.75 mg/dL (ref 0.44–1.00)
GFR calc Af Amer: >60 mL/min (ref >60)
GFR calc non Af Amer: >60 mL/min (ref >60)
GLUCOSE: 94 mg/dL (ref 65–99)
POTASSIUM: 3.6 mmol/L (ref 3.5–5.1)
Sodium: 138 mmol/L (ref 135–145)
TOTAL PROTEIN: 6.2 g/dL — AB (ref 6.5–8.1)

## 2017-04-27 LAB — PHENYTOIN LEVEL, TOTAL: Phenytoin Lvl: 16.5 ug/mL (ref 10.0–20.0)

## 2017-04-27 LAB — URINALYSIS, COMPLETE (UACMP) WITH MICROSCOPIC
Bacteria, UA: NONE SEEN
Bilirubin Urine: NEGATIVE
GLUCOSE, UA: NEGATIVE mg/dL
KETONES UR: NEGATIVE mg/dL
LEUKOCYTES UA: NEGATIVE
Nitrite: NEGATIVE
PROTEIN: NEGATIVE mg/dL
Specific Gravity, Urine: 1.003 — ABNORMAL LOW (ref 1.005–1.030)
WBC, UA: NONE SEEN WBC/hpf (ref 0–5)
pH: 6 (ref 5.0–8.0)

## 2017-04-27 LAB — CBC WITH DIFFERENTIAL/PLATELET
BASOS PCT: 1 %
Basophils Absolute: 0 10*3/uL (ref 0–0.1)
EOS ABS: 0.2 10*3/uL (ref 0–0.7)
EOS PCT: 4 %
HEMATOCRIT: 37.6 % (ref 35.0–47.0)
Hemoglobin: 13 g/dL (ref 12.0–16.0)
Lymphocytes Relative: 37 %
Lymphs Abs: 1.5 10*3/uL (ref 1.0–3.6)
MCH: 34.4 pg — ABNORMAL HIGH (ref 26.0–34.0)
MCHC: 34.5 g/dL (ref 32.0–36.0)
MCV: 100 fL (ref 80.0–100.0)
MONO ABS: 0.5 10*3/uL (ref 0.2–0.9)
MONOS PCT: 13 %
Neutro Abs: 1.9 10*3/uL (ref 1.4–6.5)
Neutrophils Relative %: 45 %
PLATELETS: 192 10*3/uL (ref 150–440)
RBC: 3.76 MIL/uL — ABNORMAL LOW (ref 3.80–5.20)
RDW: 13.3 % (ref 11.5–14.5)
WBC: 4.1 10*3/uL (ref 3.6–11.0)

## 2017-04-27 NOTE — ED Notes (Signed)
After holding for 5-10 minutes with no answer at the [place].  Spoke to emergency contact Sharyn Blitz.  She is unable to drive, but will work on getting pt a ride home.  My phone number given to her to call me back with an update.

## 2017-04-27 NOTE — ED Provider Notes (Signed)
[facility] Emergency Department Provider Note   ____________________________________________   First MD Initiated Contact with Patient 04/27/17 [phone]     (approximate)  I have reviewed the triage vital signs and the nursing notes.   HISTORY  Chief Complaint Fall    HPI Marcia Brown is a 61 y.o. female brought to the ED from skilled nursing facility via EMS with a chief complaint of witnessed fall.  Patient does not recall falling but states that she awoke on her left side.  Think she was going to the restroom and urinated on herself.  She was able to clean herself up before calling for help.  Complains of left forehead, shoulder and hip pain.  Denies recent fever, chills, chest pain, shortness of breath, abdominal pain, nausea, vomiting.   Past Medical History:  Diagnosis Date  . Anxiety   . Asthma   . COPD (chronic obstructive pulmonary disease) ([place])   . Depression   . High cholesterol   . Polyneuropathy   . Seizures ([place])   . Sleep apnea     Patient Active Problem List   Diagnosis Date Noted  . Seizure ([place]) 02/03/2015    Past Surgical History:  Procedure Laterality Date  . ESOPHAGEAL DILATION    . FOOT SURGERY Right     Prior to Admission medications   Medication Sig Start Date End Date Taking? Authorizing Provider  acetaminophen (MAPAP) 500 MG tablet Take 500 mg by mouth every 6 (six) hours as needed for mild pain or moderate pain.    [provider]  acetaminophen (TYLENOL) 325 MG tablet Take 325 mg by mouth every 4 (four) hours as needed for mild pain. Take 2 tablets (650 mg) by mouth every 4 hours daily as needed    [provider]  albuterol (PROVENTIL HFA;VENTOLIN HFA) 108 (90 BASE) MCG/ACT inhaler Inhale 2 puffs into the lungs every 4 (four) hours as needed for wheezing or shortness of breath.    [provider]  alum & mag hydroxide-simeth (ANTACID LIQUID) 200-200-20 MG/5ML suspension Take 30 mLs by  mouth every 6 (six) hours as needed for indigestion or heartburn.    [provider]  atorvastatin (LIPITOR) 10 MG tablet Take 10 mg by mouth at bedtime.     [provider]  barrier cream (NON-SPECIFIED) CREA Apply 1 application topically as needed. Apply topically after toileting as needed to prevent skin breakdown    [provider]  cholecalciferol (VITAMIN D) 1000 units tablet Take 2,000 Units by mouth daily. Give with Vitamin D3 400 units    [provider]  cholecalciferol (VITAMIN D) 400 UNITS TABS tablet Take 400 Units by mouth daily. Give with Vitamin D3 2000 units    [provider]  clonazePAM (KLONOPIN) 0.5 MG tablet Take 0.5 mg by mouth 3 (three) times daily.     [provider]  clopidogrel (PLAVIX) 75 MG tablet Take 1 tablet (75 mg total) by mouth daily. 11/14/16 11/14/17  Darel Hong, MD  cyanocobalamin 500 MCG tablet Take 500 mcg by mouth daily.    [provider]  diclofenac sodium (VOLTAREN) 1 % GEL Apply 4 g topically 3 (three) times daily. Apply to both knees    [provider]  diphenhydrAMINE (BENADRYL) 25 MG tablet Take 25 mg by mouth every 6 (six) hours as needed for allergies.    [provider]  DULoxetine (CYMBALTA) 60 MG capsule Take 60 mg by mouth daily.    [provider]  ferrous sulfate 325 (65 FE) MG tablet Take 325 mg by mouth 3 (three) times daily with meals.    [provider]  gabapentin (NEURONTIN) 100 MG capsule Take 200 mg by mouth 2 (two) times daily.    [provider]  guaiFENesin (ROBITUSSIN) 100 MG/5ML SOLN Take 15 mLs by mouth every 4 (four) hours as needed for cough or to loosen phlegm.    [provider]  lacosamide 100 MG TABS Take 1 tablet (100 mg total) by mouth 2 (two) times daily. 02/04/15   Hillary Bow, MD  lamoTRIgine (LAMICTAL) 200 MG tablet Take 200-400 mg by mouth 2 (two) times daily. Pt takes 2 tablets (400 mg) in the  morning on Monday, Wednesday and Friday and only 1 tablet (200 mg) bid on all other days in the morning.    [provider]  levETIRAcetam (KEPPRA) 1000 MG tablet Take 1 tablet (1,000 mg total) by mouth 2 (two) times daily. Patient taking differently: Take 1,500-2,000 mg by mouth daily. Take 2000 mg by mouth in the morning Tuesday, Thursday, Saturday and Sunday and 1500 mg by mouth every Monday, Wednesday and Friday morning. 11/26/15   Daymon Larsen, MD  loperamide (IMODIUM) 2 MG capsule Take 4 mg by mouth as needed for diarrhea or loose stools.    [provider]  magnesium hydroxide (MILK OF MAGNESIA) 400 MG/5ML suspension Take 30 mLs by mouth daily as needed for mild constipation or moderate constipation.    [provider]  meloxicam (MOBIC) 15 MG tablet Take 1 tablet (15 mg total) by mouth daily. 12/23/15   Cuthriell, Charline Bills, PA-C  mometasone-formoterol (DULERA) 100-5 MCG/ACT AERO Inhale 2 puffs into the lungs 2 (two) times daily.    [provider]  mupirocin ointment (BACTROBAN) 2 % Apply 1 application topically 2 (two) times daily. 10/30/16   Edrick Kins, DPM  Neomycin-Bacitracin-Polymyxin (TRIPLE ANTIBIOTIC) 3.5-4040529218 OINT Apply topically daily.    [provider]  NONFORMULARY OR COMPOUNDED ITEM Apply 1-2 g topically 4 (four) times daily. 04/03/16   Edrick Kins, DPM  ondansetron (ZOFRAN ODT) 4 MG disintegrating tablet Take 1 tablet (4 mg total) by mouth every 8 (eight) hours as needed for nausea or vomiting. 07/21/16   Earleen Newport, MD  pantoprazole (PROTONIX) 40 MG tablet Take 40 mg by mouth daily before breakfast.     [provider]  phenytoin (DILANTIN) 100 MG ER capsule Take 100 mg by mouth 2 (two) times daily.     [provider]  Powders (GOLD BOND BABY POWDER EX) Apply topically daily as needed. For dry itchy skin    [provider]  pyrithione zinc (HEAD AND SHOULDERS) 1 % shampoo Apply  topically daily as needed for itching.    [provider]  traMADol (ULTRAM) 50 MG tablet Take 50 mg by mouth 2 (two) times daily as needed for moderate pain.    [provider]    Allergies Ciprofloxacin and Aspirin  History reviewed. No pertinent family history.  Social History Social History   Tobacco Use  . Smoking status: Current Every Day Smoker    Packs/day: 0.50    Types: Cigarettes  . Smokeless tobacco: Never Used  Substance Use Topics  . Alcohol use: No  . Drug use: No    Review of Systems   Constitutional: No fever/chills. Eyes: No visual changes. ENT: No sore throat. Cardiovascular: Denies chest pain. Respiratory: Denies shortness of breath. Gastrointestinal: No  abdominal pain.  No nausea, no vomiting.  No diarrhea.  No constipation. Genitourinary: Negative for dysuria. Musculoskeletal: Positive for left shoulder and left hip pain.  Negative for back pain. Skin: Negative for rash. Neurological: Negative for headaches, focal weakness or numbness.   ____________________________________________   PHYSICAL EXAM:  VITAL SIGNS: ED Triage Vitals  Enc Vitals Group     BP 04/27/17 0350 (!) 147/78     Pulse Rate 04/27/17 0350 (!) 55     Resp 04/27/17 0350 11     Temp 04/27/17 0350 98 F (36.7 C)     Temp Source 04/27/17 0350 Oral     SpO2 04/27/17 0350 98 %     Weight 04/27/17 0359 190 lb (86.2 kg)     Height 04/27/17 0359 5\' 4"  (1.626 m)     Head Circumference --      Peak Flow --      Pain Score 04/27/17 0358 8     Pain Loc --      Pain Edu? --      Excl. in [place]? --     Constitutional: Alert and oriented. Well appearing and in no acute distress. Eyes: Conjunctivae are normal. PERRL. EOMI. Head: Atraumatic.  No scalp hematomas. Nose: No deformity. Mouth/Throat: No dental malocclusion. Neck: No stridor.  No cervical spine tenderness to palpation.  No step-offs or deformities. Cardiovascular: Normal rate, regular rhythm. Grossly  normal heart sounds.  Good peripheral circulation. Respiratory: Normal respiratory effort.  No retractions. Lungs CTAB. Gastrointestinal: Soft and nontender. No distention. No abdominal bruits. No CVA tenderness. Musculoskeletal: No spinal tenderness to palpation.  Left anterior shoulder tender to palpation with limited range of motion secondary to pain.  2+ distal pulses.  Brisk, less than 5-second capillary refill.  Left hip tender to palpation with limited range of motion.  There is no shortening or rotation.  No edema.  No joint effusions. Neurologic:  Normal speech and language. No gross focal neurologic deficits are appreciated. Skin:  Skin is warm, dry and intact. No rash noted. Psychiatric: Mood and affect are normal. Speech and behavior are normal.  ____________________________________________   LABS (all labs ordered are listed, but only abnormal results are displayed)  Labs Reviewed  CBC WITH DIFFERENTIAL/PLATELET - Abnormal; Notable for the following components:      Result Value   RBC 3.76 (*)    MCH 34.4 (*)    All other components within normal limits  COMPREHENSIVE METABOLIC PANEL - Abnormal; Notable for the following components:   Total Protein 6.2 (*)    All other components within normal limits  URINALYSIS, COMPLETE (UACMP) WITH MICROSCOPIC - Abnormal; Notable for the following components:   Color, Urine STRAW (*)    APPearance CLEAR (*)    Specific Gravity, Urine 1.003 (*)    Hgb urine dipstick SMALL (*)    Squamous Epithelial / LPF 0-5 (*)    All other components within normal limits  PHENYTOIN LEVEL, TOTAL   ____________________________________________  EKG  ED ECG REPORT I, SUNG,JADE J, the attending physician, personally viewed and interpreted this ECG.   Date: 04/27/2017  EKG Time: 0344  Rate: 54  Rhythm: sinus bradycardia  Axis: Normal  Intervals:nonspecific intraventricular conduction delay  ST&T Change:  Nonspecific  ____________________________________________  RADIOLOGY  Ct Head Wo Contrast  Result Date: 04/27/2017 CLINICAL DATA:  Status post fall, with left forehead pain. Concern for head injury. EXAM: CT HEAD WITHOUT CONTRAST TECHNIQUE: Contiguous axial images were obtained from the base of  the skull through the vertex without intravenous contrast. COMPARISON:  Of the head performed 11/14/2016 FINDINGS: Brain: No evidence of acute infarction, hemorrhage, hydrocephalus, extra-axial collection or mass lesion/mass effect. Prominence of the sulci suggests mild cortical volume loss. Cerebellar atrophy is noted. Chronic encephalomalacia is noted at the right temporal lobe. The brainstem and fourth ventricle are within normal limits. The basal ganglia are unremarkable in appearance. The cerebral hemispheres demonstrate grossly normal gray-white differentiation. No mass effect or midline shift is seen. Vascular: No hyperdense vessel or unexpected calcification. Skull: There is no evidence of fracture; chronic deformity is noted along the right parietal calvarium. Sinuses/Orbits: The visualized portions of the orbits are within normal limits. The paranasal sinuses and mastoid air cells are well-aerated. Other: No significant soft tissue abnormalities are seen. IMPRESSION: 1. No acute intracranial pathology seen on CT. 2. Mild cortical volume loss. 3. Chronic encephalomalacia again noted at the right temporal lobe. Electronically Signed   By: Garald Balding M.D.   On: 04/27/2017 05:24   Dg Shoulder Left  Result Date: 04/27/2017 CLINICAL DATA:  Status post fall, with left shoulder pain. Initial encounter. EXAM: LEFT SHOULDER - 2+ VIEW COMPARISON:  None. FINDINGS: There is no evidence of fracture or dislocation. The left humeral head is seated within the glenoid fossa. Mild degenerative change is noted at the left acromioclavicular joint. No significant soft tissue abnormalities are seen. The visualized  portions of the left lung are clear. IMPRESSION: No evidence of fracture or dislocation. Electronically Signed   By: Garald Balding M.D.   On: 04/27/2017 05:20   Dg Hip Unilat With Pelvis 2-3 Views Left  Result Date: 04/27/2017 CLINICAL DATA:  Status post fall, with left hip pain. Initial encounter. EXAM: DG HIP (WITH OR WITHOUT PELVIS) 2-3V LEFT COMPARISON:  Left hip radiographs performed 12/23/2015 FINDINGS: There is no evidence of fracture or dislocation. Both femoral heads are seated normally within their respective acetabula. The proximal left femur appears intact. No significant degenerative change is appreciated. The sacroiliac joints are unremarkable in appearance. The visualized bowel gas pattern is grossly unremarkable in appearance. IMPRESSION: No evidence of fracture or dislocation. Electronically Signed   By: Garald Balding M.D.   On: 04/27/2017 05:19    ____________________________________________   PROCEDURES  Procedure(s) performed: None  Procedures  Critical Care performed: No  ____________________________________________   INITIAL IMPRESSION / ASSESSMENT AND PLAN / ED COURSE  As part of my medical decision making, I reviewed the following data within the [place] notes reviewed and incorporated, Labs reviewed, EKG interpreted, Radiograph reviewed and Notes from prior ED visits.   61 year old female brought to the ED from nursing facility with a chief complaint of unwitnessed fall.  Patient complains of left forehead, shoulder and hip discomfort.  Differential diagnosis includes but is not limited to acute traumatic injury, ICH, fracture/dislocation.  Will check screening lab work, urinalysis, CT head, x-rays of left shoulder and hip, and reassess.  Clinical Course as of Apr 27 653  Sat Apr 27, 2017  0648 Patient resting in no acute distress.  Updated her of laboratory and imaging results.  Patient normally ambulates with a walker.  Will  get patient up and ambulating before discharge back to facility.  [JS]  [id number] Patient ambulated well with walker with steady gait.  Strict return precautions given.  Patient verbalizes understanding and agrees with plan of care.  [JS]    Clinical Course User Index [JS] Paulette Blanch, MD  ____________________________________________   FINAL CLINICAL IMPRESSION(S) / ED DIAGNOSES  Final diagnoses:  Fall, initial encounter  Acute pain of left shoulder  Pain of left hip joint     ED Discharge Orders    None       Note:  This document was prepared using Dragon voice recognition software and may include unintentional dictation errors.    Paulette Blanch, MD 04/27/17 [phone]

## 2017-04-27 NOTE — Discharge Instructions (Signed)
You may take Tylenol as needed for pain.  Use your walker at all times to walk.  Return to the ER for worsening symptoms, persistent vomiting, difficulty breathing or other concerns.

## 2017-04-27 NOTE — ED Notes (Signed)
Pt states that she in unable to find a ride home. Will discuss with charge RN about what to do next.

## 2017-04-27 NOTE — ED Notes (Signed)
Spoke to [place], pt's sister will be here in about 30 min

## 2017-04-27 NOTE — ED Notes (Signed)
Pt's sister is at bedside. Went over [employer]. Dc back to [facility] via wc

## 2017-04-27 NOTE — ED Triage Notes (Signed)
Pt fell at [facility], unwitnessed. Pt does not recall falling. Pt c/o L hip, L shoulder, L forehead pain. Pt stated that she woke up on her left side. Pt in no acute distress at this time.

## 2017-05-11 ENCOUNTER — Other Ambulatory Visit: Payer: Self-pay

## 2017-05-11 ENCOUNTER — Emergency Department: Payer: Medicaid Other

## 2017-05-11 DIAGNOSIS — S0990XA Unspecified injury of head, initial encounter: Secondary | ICD-10-CM | POA: Diagnosis present

## 2017-05-11 DIAGNOSIS — F17210 Nicotine dependence, cigarettes, uncomplicated: Secondary | ICD-10-CM | POA: Insufficient documentation

## 2017-05-11 DIAGNOSIS — J45909 Unspecified asthma, uncomplicated: Secondary | ICD-10-CM | POA: Insufficient documentation

## 2017-05-11 DIAGNOSIS — J449 Chronic obstructive pulmonary disease, unspecified: Secondary | ICD-10-CM | POA: Diagnosis not present

## 2017-05-11 DIAGNOSIS — Y92129 Unspecified place in nursing home as the place of occurrence of the external cause: Secondary | ICD-10-CM | POA: Diagnosis not present

## 2017-05-11 DIAGNOSIS — Y939 Activity, unspecified: Secondary | ICD-10-CM | POA: Insufficient documentation

## 2017-05-11 DIAGNOSIS — Z79899 Other long term (current) drug therapy: Secondary | ICD-10-CM | POA: Diagnosis not present

## 2017-05-11 DIAGNOSIS — Y999 Unspecified external cause status: Secondary | ICD-10-CM | POA: Diagnosis not present

## 2017-05-11 DIAGNOSIS — S0083XA Contusion of other part of head, initial encounter: Secondary | ICD-10-CM | POA: Diagnosis not present

## 2017-05-11 DIAGNOSIS — W050XXA Fall from non-moving wheelchair, initial encounter: Secondary | ICD-10-CM | POA: Insufficient documentation

## 2017-05-11 DIAGNOSIS — E876 Hypokalemia: Secondary | ICD-10-CM | POA: Diagnosis not present

## 2017-05-11 LAB — CBC
HEMATOCRIT: 38.8 % (ref 35.0–47.0)
Hemoglobin: 13.5 g/dL (ref 12.0–16.0)
MCH: 34.7 pg — ABNORMAL HIGH (ref 26.0–34.0)
MCHC: 34.8 g/dL (ref 32.0–36.0)
MCV: 99.6 fL (ref 80.0–100.0)
PLATELETS: 186 10*3/uL (ref 150–440)
RBC: 3.9 MIL/uL (ref 3.80–5.20)
RDW: 13.8 % (ref 11.5–14.5)
WBC: 5.8 10*3/uL (ref 3.6–11.0)

## 2017-05-11 LAB — BASIC METABOLIC PANEL
ANION GAP: 9 (ref 5–15)
BUN: 17 mg/dL (ref 6–20)
CO2: 25 mmol/L (ref 22–32)
CREATININE: 0.88 mg/dL (ref 0.44–1.00)
Calcium: 9.1 mg/dL (ref 8.9–10.3)
Chloride: 103 mmol/L (ref 101–111)
GFR calc Af Amer: >60 mL/min (ref >60)
GLUCOSE: 123 mg/dL — AB (ref 65–99)
Potassium: 3 mmol/L — ABNORMAL LOW (ref 3.5–5.1)
SODIUM: 137 mmol/L (ref 135–145)

## 2017-05-11 LAB — TROPONIN I: Troponin I: <0.03 ng/mL (ref <0.03)

## 2017-05-11 NOTE — ED Triage Notes (Signed)
Patient sitting in wheel chair in no acute distress at this time.

## 2017-05-11 NOTE — ED Triage Notes (Addendum)
Patient brought in by ems from [facility]. Per ems patient had a mechanical fall. Patient hit her head and has complaint of lower back pain. Denies LOC. Patient takes Plavix. Per ems bp 145/51, hr 59 and 97%.

## 2017-05-11 NOTE — ED Triage Notes (Signed)
Patient reports fall (unsure why) reports hit her head, landed on her knees and caused her back to hurt.  Patient unsure if she had loss of consciousness.  Patient also reports having chest pains since arriving at ED>

## 2017-05-11 NOTE — ED Notes (Signed)
Patient transported to CT 

## 2017-05-12 ENCOUNTER — Emergency Department
Admission: EM | Admit: 2017-05-12 | Discharge: 2017-05-12 | Disposition: A | Discharge status: Home / Self Care | Payer: Medicaid Other | Attending: Emergency Medicine | Admitting: Emergency Medicine

## 2017-05-12 DIAGNOSIS — S0083XA Contusion of other part of head, initial encounter: Secondary | ICD-10-CM

## 2017-05-12 DIAGNOSIS — E876 Hypokalemia: Secondary | ICD-10-CM

## 2017-05-12 DIAGNOSIS — W19XXXA Unspecified fall, initial encounter: Secondary | ICD-10-CM

## 2017-05-12 LAB — URINALYSIS, COMPLETE (UACMP) WITH MICROSCOPIC
Bilirubin Urine: NEGATIVE
Glucose, UA: NEGATIVE mg/dL
Ketones, ur: NEGATIVE mg/dL
Leukocytes, UA: NEGATIVE
Nitrite: NEGATIVE
Protein, ur: NEGATIVE mg/dL
Specific Gravity, Urine: 1.017 (ref 1.005–1.030)
pH: 6 (ref 5.0–8.0)

## 2017-05-12 LAB — PHENYTOIN LEVEL, TOTAL: PHENYTOIN LVL: 18.4 ug/mL (ref 10.0–20.0)

## 2017-05-12 MED ORDER — HYDROCODONE-ACETAMINOPHEN 5-325 MG PO TABS
1.0000 | ORAL_TABLET | Freq: Once | ORAL | Status: AC
  Administered 2017-05-12: 1 via ORAL
  Filled 2017-05-12: qty 1

## 2017-05-12 MED ORDER — POTASSIUM CHLORIDE CRYS ER 20 MEQ PO TBCR
40.0000 meq | EXTENDED_RELEASE_TABLET | Freq: Once | ORAL | Status: AC
  Administered 2017-05-12: 40 meq via ORAL
  Filled 2017-05-12: qty 2

## 2017-05-12 NOTE — Discharge Instructions (Signed)
Apply ice to affected area several times daily as needed to reduce swelling.  You may take Tylenol as needed for pain.  Return to the ER for worsening symptoms, persistent vomiting, lethargy or other concerns.

## 2017-05-12 NOTE — ED Notes (Signed)
Pt's niece and sister called for ride back to the [facility].  VM left at niece's number, unable to leave message w/ sister.

## 2017-05-12 NOTE — ED Notes (Signed)
Patient does not appear to be in any acute distress at time of discharge. Patient wheeled to lobby with family. Patient denies any comments or concerns regarding discharge.

## 2017-05-12 NOTE — ED Provider Notes (Signed)
[facility] Emergency Department Provider Note   ____________________________________________   First MD Initiated Contact with Patient 05/12/17 0405     (approximate)  I have reviewed the triage vital signs and the nursing notes.   HISTORY  Chief Complaint Fall    HPI Marcia Brown is a 61 y.o. female brought to the ED via EMS from the [facility] with a chief complaint of mechanical fall.  Patient reports she is wheelchair-bound and tumbled forward out of her wheelchair, striking her head.  Complains of head and lower back pain.  Takes Plavix.  History of frequent falls.  No reported seizure prior to arrival.  Denies LOC, vision changes, neck pain, chest pain, shortness of breath, abdominal pain, nausea or vomiting.   Past Medical History:  Diagnosis Date  . Anxiety   . Asthma   . COPD (chronic obstructive pulmonary disease) ([place])   . Depression   . High cholesterol   . Polyneuropathy   . Seizures ([place])   . Sleep apnea     Patient Active Problem List   Diagnosis Date Noted  . Seizure ([place]) 02/03/2015    Past Surgical History:  Procedure Laterality Date  . ESOPHAGEAL DILATION    . FOOT SURGERY Right     Prior to Admission medications   Medication Sig Start Date End Date Taking? Authorizing Provider  acetaminophen (MAPAP) 500 MG tablet Take 500 mg by mouth every 6 (six) hours as needed for mild pain or moderate pain.    [provider]  acetaminophen (TYLENOL) 325 MG tablet Take 325 mg by mouth every 4 (four) hours as needed for mild pain. Take 2 tablets (650 mg) by mouth every 4 hours daily as needed    [provider]  albuterol (PROVENTIL HFA;VENTOLIN HFA) 108 (90 BASE) MCG/ACT inhaler Inhale 2 puffs into the lungs every 4 (four) hours as needed for wheezing or shortness of breath.    [provider]  alum & mag hydroxide-simeth (ANTACID LIQUID) 200-200-20 MG/5ML suspension Take 30 mLs by mouth every 6 (six) hours as  needed for indigestion or heartburn.    [provider]  atorvastatin (LIPITOR) 10 MG tablet Take 10 mg by mouth at bedtime.     [provider]  barrier cream (NON-SPECIFIED) CREA Apply 1 application topically as needed. Apply topically after toileting as needed to prevent skin breakdown    [provider]  cholecalciferol (VITAMIN D) 1000 units tablet Take 2,000 Units by mouth daily. Give with Vitamin D3 400 units    [provider]  cholecalciferol (VITAMIN D) 400 UNITS TABS tablet Take 400 Units by mouth daily. Give with Vitamin D3 2000 units    [provider]  clonazePAM (KLONOPIN) 0.5 MG tablet Take 0.5 mg by mouth 3 (three) times daily.     [provider]  clopidogrel (PLAVIX) 75 MG tablet Take 1 tablet (75 mg total) by mouth daily. 11/14/16 11/14/17  Darel Hong, MD  cyanocobalamin 500 MCG tablet Take 500 mcg by mouth daily.    [provider]  diclofenac sodium (VOLTAREN) 1 % GEL Apply 4 g topically 3 (three) times daily. Apply to both knees    [provider]  diphenhydrAMINE (BENADRYL) 25 MG tablet Take 25 mg by mouth every 6 (six) hours as needed for allergies.    [provider]  DULoxetine (CYMBALTA) 60 MG capsule Take 60 mg by mouth daily.    [provider]  ferrous sulfate 325 (65  FE) MG tablet Take 325 mg by mouth 3 (three) times daily with meals.    [provider]  gabapentin (NEURONTIN) 100 MG capsule Take 200 mg by mouth 2 (two) times daily.    [provider]  guaiFENesin (ROBITUSSIN) 100 MG/5ML SOLN Take 15 mLs by mouth every 4 (four) hours as needed for cough or to loosen phlegm.    [provider]  lacosamide 100 MG TABS Take 1 tablet (100 mg total) by mouth 2 (two) times daily. 02/04/15   Hillary Bow, MD  lamoTRIgine (LAMICTAL) 200 MG tablet Take 200-400 mg by mouth 2 (two) times daily. Pt takes 2 tablets (400 mg) in the morning on Monday, Wednesday and  Friday and only 1 tablet (200 mg) bid on all other days in the morning.    [provider]  levETIRAcetam (KEPPRA) 1000 MG tablet Take 1 tablet (1,000 mg total) by mouth 2 (two) times daily. Patient taking differently: Take 1,500-2,000 mg by mouth daily. Take 2000 mg by mouth in the morning Tuesday, Thursday, Saturday and Sunday and 1500 mg by mouth every Monday, Wednesday and Friday morning. 11/26/15   Daymon Larsen, MD  loperamide (IMODIUM) 2 MG capsule Take 4 mg by mouth as needed for diarrhea or loose stools.    [provider]  magnesium hydroxide (MILK OF MAGNESIA) 400 MG/5ML suspension Take 30 mLs by mouth daily as needed for mild constipation or moderate constipation.    [provider]  meloxicam (MOBIC) 15 MG tablet Take 1 tablet (15 mg total) by mouth daily. 12/23/15   Cuthriell, Charline Bills, PA-C  mometasone-formoterol (DULERA) 100-5 MCG/ACT AERO Inhale 2 puffs into the lungs 2 (two) times daily.    [provider]  mupirocin ointment (BACTROBAN) 2 % Apply 1 application topically 2 (two) times daily. 10/30/16   Edrick Kins, DPM  Neomycin-Bacitracin-Polymyxin (TRIPLE ANTIBIOTIC) [phone] OINT Apply topically daily.    [provider]  NONFORMULARY OR COMPOUNDED ITEM Apply 1-2 g topically 4 (four) times daily. 04/03/16   Edrick Kins, DPM  ondansetron (ZOFRAN ODT) 4 MG disintegrating tablet Take 1 tablet (4 mg total) by mouth every 8 (eight) hours as needed for nausea or vomiting. 07/21/16   Earleen Newport, MD  pantoprazole (PROTONIX) 40 MG tablet Take 40 mg by mouth daily before breakfast.     [provider]  phenytoin (DILANTIN) 100 MG ER capsule Take 100 mg by mouth 2 (two) times daily.     [provider]  Powders (GOLD BOND BABY POWDER EX) Apply topically daily as needed. For dry itchy skin    [provider]  pyrithione zinc (HEAD AND SHOULDERS) 1 % shampoo Apply topically daily as needed for itching.     [provider]  traMADol (ULTRAM) 50 MG tablet Take 50 mg by mouth 2 (two) times daily as needed for moderate pain.    [provider]    Allergies Ciprofloxacin and Aspirin  No family history on file.  Social History Social History   Tobacco Use  . Smoking status: Current Every Day Smoker    Packs/day: 0.50    Types: Cigarettes  . Smokeless tobacco: Never Used  Substance Use Topics  . Alcohol use: No  . Drug use: No    Review of Systems  Constitutional: No fever/chills. Eyes: No visual changes. ENT: No sore throat. Cardiovascular: Denies chest pain. Respiratory: Denies shortness of breath. Gastrointestinal: No abdominal pain.  No nausea, no vomiting.  No  diarrhea.  No constipation. Genitourinary: Negative for dysuria. Musculoskeletal: Positive for back pain. Skin: Negative for rash. Neurological: Negative for headaches, focal weakness or numbness.   ____________________________________________   PHYSICAL EXAM:  VITAL SIGNS: ED Triage Vitals  Enc Vitals Group     BP 05/12/17 0400 110/89     Pulse Rate 05/12/17 0400 (!) 52     Resp 05/12/17 0400 12     Temp --      Temp src --      SpO2 05/12/17 0400 100 %     Weight 05/11/17 2324 178 lb (80.7 kg)     Height --      Head Circumference --      Peak Flow --      Pain Score 05/12/17 0359 6     Pain Loc --      Pain Edu? --      Excl. in [place]? --     Constitutional: Asleep, awakened for exam.  Alert and oriented. Well appearing and in no acute distress. Eyes: Conjunctivae are normal. PERRL. EOMI. Head: Small left forehead hematoma. Nose: No deformities. Mouth/Throat: No dental malocclusion.  Mucous membranes are moist.   Neck: No stridor.  No cervical spine tenderness to palpation. Cardiovascular: Normal rate, regular rhythm. Grossly normal heart sounds.  Good peripheral circulation. Respiratory: Normal respiratory effort.  No retractions. Lungs CTAB. Gastrointestinal: Soft and  nontender. No distention. No abdominal bruits. No CVA tenderness. Musculoskeletal: No spinal tenderness to palpation.  No lower extremity tenderness nor edema.  No joint effusions. Neurologic: Alert and oriented x3.  CN II-XII grossly intact.  Normal speech and language. No gross focal neurologic deficits are appreciated.  Skin:  Skin is warm, dry and intact. No rash noted. Psychiatric: Mood and affect are normal. Speech and behavior are normal.  ____________________________________________   LABS (all labs ordered are listed, but only abnormal results are displayed)  Labs Reviewed  BASIC METABOLIC PANEL - Abnormal; Notable for the following components:      Result Value   Potassium 3.0 (*)    Glucose, Bld 123 (*)    All other components within normal limits  CBC - Abnormal; Notable for the following components:   MCH 34.7 (*)    All other components within normal limits  URINALYSIS, COMPLETE (UACMP) WITH MICROSCOPIC - Abnormal; Notable for the following components:   Color, Urine YELLOW (*)    APPearance CLEAR (*)    Hgb urine dipstick MODERATE (*)    Bacteria, UA RARE (*)    Squamous Epithelial / LPF 0-5 (*)    All other components within normal limits  TROPONIN I  PHENYTOIN LEVEL, TOTAL   ____________________________________________  EKG  ED ECG REPORT I, Zaria Taha J, the attending physician, personally viewed and interpreted this ECG.   Date: 05/12/2017  EKG Time: 2327  Rate: 55  Rhythm: sinus bradycardia  Axis: Normal  Intervals:none  ST&T Change: Nonspecific  ____________________________________________  RADIOLOGY  Ct Head Wo Contrast  Result Date: 05/12/2017 CLINICAL DATA:  61 year old female with acute head injury from fall. Patient on Plavix. Initial encounter. EXAM: CT HEAD WITHOUT CONTRAST TECHNIQUE: Contiguous axial images were obtained from the base of the skull through the vertex without intravenous contrast. COMPARISON:  04/27/2017 and prior CTs  FINDINGS: Brain: No evidence of acute infarction, hemorrhage, hydrocephalus, extra-axial collection or mass lesion/mass effect. Atrophy and right temporal encephalomalacia again noted. Vascular: No hyperdense vessel or unexpected calcification. Skull: No acute abnormality. Right temporal craniotomy changes again noted.  Sinuses/Orbits: No acute finding. Other: None. IMPRESSION: 1. No evidence of acute intracranial abnormality 2. Atrophy and chronic right temporal encephalomalacia. Electronically Signed   By: Margarette Canada M.D.   On: 05/12/2017 00:01    ____________________________________________   PROCEDURES  Procedure(s) performed: None  Procedures  Critical Care performed: No  ____________________________________________   INITIAL IMPRESSION / ASSESSMENT AND PLAN / ED COURSE  As part of my medical decision making, I reviewed the following data within the [facility] notes reviewed and incorporated, Labs reviewed, EKG interpreted, Radiograph reviewed  and Notes from prior ED visits.   61 year old female from SNF who presents status post mechanical fall.  Differential diagnosis includes but is not limited to traumatic, infectious, metabolic etiologies.    Left forehead hematoma noted.  Patient is on Plavix.  Fortunately CT head is negative for intracranial hemorrhage.  Labs remarkable for mild hypokalemia; will replete and.  Awaiting urine specimen.  Clinical Course as of May 12 537  Sun May 12, 2017  0537 Updated patient on urinalysis and phenytoin results.  Will discharge back to facility.  Strict return precautions given.  Patient verbalizes understanding and agrees with plan of care.  [JS]    Clinical Course User Index [JS] Paulette Blanch, MD     ____________________________________________   FINAL CLINICAL IMPRESSION(S) / ED DIAGNOSES  Final diagnoses:  Fall, initial encounter  Contusion of face, initial encounter  Hypokalemia     ED Discharge  Orders    None       Note:  This document was prepared using Dragon voice recognition software and may include unintentional dictation errors.    Paulette Blanch, MD 05/12/17 [phone]

## 2017-05-12 NOTE — ED Triage Notes (Signed)
[facility] Marcia Brown niece [phone]

## 2017-05-12 NOTE — ED Notes (Signed)
Report called to the [facility], per the nurse, transport may be available at 8am if unable to contact family.

## 2017-05-12 NOTE — ED Triage Notes (Signed)
Patient sitting in wheel chair with eyes closed in no acute distress at this time.

## 2017-05-12 NOTE — ED Notes (Addendum)
Pt's niece, Kendrick Fries, called again, states she will be at hospital within an hour to pick up pt.  Phone #: [phone]

## 2017-07-19 ENCOUNTER — Other Ambulatory Visit: Payer: Self-pay | Admitting: Physician Assistant

## 2017-07-25 ENCOUNTER — Emergency Department: Payer: Medicaid Other

## 2017-07-25 ENCOUNTER — Emergency Department
Admission: EM | Admit: 2017-07-25 | Discharge: 2017-07-25 | Disposition: A | Discharge status: Home / Self Care | Payer: Medicaid Other | Attending: Emergency Medicine | Admitting: Emergency Medicine

## 2017-07-25 ENCOUNTER — Other Ambulatory Visit: Payer: Self-pay

## 2017-07-25 ENCOUNTER — Encounter: Payer: Self-pay | Admitting: *Deleted

## 2017-07-25 DIAGNOSIS — Y9301 Activity, walking, marching and hiking: Secondary | ICD-10-CM | POA: Diagnosis not present

## 2017-07-25 DIAGNOSIS — Y92129 Unspecified place in nursing home as the place of occurrence of the external cause: Secondary | ICD-10-CM | POA: Insufficient documentation

## 2017-07-25 DIAGNOSIS — Z79899 Other long term (current) drug therapy: Secondary | ICD-10-CM | POA: Diagnosis not present

## 2017-07-25 DIAGNOSIS — S20212A Contusion of left front wall of thorax, initial encounter: Secondary | ICD-10-CM | POA: Diagnosis not present

## 2017-07-25 DIAGNOSIS — J45909 Unspecified asthma, uncomplicated: Secondary | ICD-10-CM | POA: Diagnosis not present

## 2017-07-25 DIAGNOSIS — W19XXXA Unspecified fall, initial encounter: Secondary | ICD-10-CM

## 2017-07-25 DIAGNOSIS — R51 Headache: Secondary | ICD-10-CM | POA: Insufficient documentation

## 2017-07-25 DIAGNOSIS — F17210 Nicotine dependence, cigarettes, uncomplicated: Secondary | ICD-10-CM | POA: Diagnosis not present

## 2017-07-25 DIAGNOSIS — J449 Chronic obstructive pulmonary disease, unspecified: Secondary | ICD-10-CM | POA: Insufficient documentation

## 2017-07-25 DIAGNOSIS — Y999 Unspecified external cause status: Secondary | ICD-10-CM | POA: Diagnosis not present

## 2017-07-25 DIAGNOSIS — S5012XA Contusion of left forearm, initial encounter: Secondary | ICD-10-CM | POA: Diagnosis not present

## 2017-07-25 DIAGNOSIS — S59912A Unspecified injury of left forearm, initial encounter: Secondary | ICD-10-CM | POA: Diagnosis present

## 2017-07-25 DIAGNOSIS — W010XXA Fall on same level from slipping, tripping and stumbling without subsequent striking against object, initial encounter: Secondary | ICD-10-CM | POA: Insufficient documentation

## 2017-07-25 MED ORDER — HYDROCODONE-ACETAMINOPHEN 5-325 MG PO TABS: 1.0000 | ORAL_TABLET | Freq: Four times a day (QID) | ORAL | 0 refills | Status: DC | PRN

## 2017-07-25 MED ORDER — OXYCODONE-ACETAMINOPHEN 5-325 MG PO TABS
1.0000 | ORAL_TABLET | Freq: Once | ORAL | Status: AC
  Administered 2017-07-25: 1 via ORAL
  Filled 2017-07-25: qty 1

## 2017-07-25 NOTE — ED Triage Notes (Signed)
Pt was getting up and fell over on her walker. The fall was unwitnessed but her roommate states she saw the fall. Pt states her L forearm and side painful  Pain 10/10

## 2017-07-25 NOTE — ED Provider Notes (Signed)
[facility] Emergency Department Provider Note   ____________________________________________   First MD Initiated Contact with Patient 07/25/17 [phone]     (approximate)  I have reviewed the triage vital signs and the nursing notes.   HISTORY  Chief Complaint Fall    HPI Marcia Brown is a 62 y.o. female brought to the ED from nursing facility via EMS with a chief complaint of mechanical fall.  Patient was getting into her wheelchair and use the restroom when she tripped over it, striking her left side.  Unsure if she struck her head but complains of head pain.  Also complains of left forearm and left rib pain.  Denies LOC.  Denies vision changes, neck pain, shortness of breath, abdominal pain, nausea or vomiting.   Past Medical History:  Diagnosis Date  . Anxiety   . Asthma   . COPD (chronic obstructive pulmonary disease) ([place])   . Depression   . High cholesterol   . Polyneuropathy   . Seizures ([place])   . Sleep apnea     Patient Active Problem List   Diagnosis Date Noted  . Seizure ([place]) 02/03/2015    Past Surgical History:  Procedure Laterality Date  . ESOPHAGEAL DILATION    . FOOT SURGERY Right     Prior to Admission medications   Medication Sig Start Date End Date Taking? Authorizing Provider  acetaminophen (MAPAP) 500 MG tablet Take 500 mg by mouth every 6 (six) hours as needed for mild pain or moderate pain.    [provider]  acetaminophen (TYLENOL) 325 MG tablet Take 325 mg by mouth every 4 (four) hours as needed for mild pain. Take 2 tablets (650 mg) by mouth every 4 hours daily as needed    [provider]  albuterol (PROVENTIL HFA;VENTOLIN HFA) 108 (90 BASE) MCG/ACT inhaler Inhale 2 puffs into the lungs every 4 (four) hours as needed for wheezing or shortness of breath.    [provider]  alum & mag hydroxide-simeth (ANTACID LIQUID) 200-200-20 MG/5ML suspension Take 30 mLs by mouth every 6 (six) hours as  needed for indigestion or heartburn.    [provider]  atorvastatin (LIPITOR) 10 MG tablet Take 10 mg by mouth at bedtime.     [provider]  barrier cream (NON-SPECIFIED) CREA Apply 1 application topically as needed. Apply topically after toileting as needed to prevent skin breakdown    [provider]  cholecalciferol (VITAMIN D) 1000 units tablet Take 2,000 Units by mouth daily. Give with Vitamin D3 400 units    [provider]  cholecalciferol (VITAMIN D) 400 UNITS TABS tablet Take 400 Units by mouth daily. Give with Vitamin D3 2000 units    [provider]  clonazePAM (KLONOPIN) 0.5 MG tablet Take 0.5 mg by mouth 3 (three) times daily.     [provider]  clopidogrel (PLAVIX) 75 MG tablet Take 1 tablet (75 mg total) by mouth daily. 11/14/16 11/14/17  Darel Hong, MD  cyanocobalamin 500 MCG tablet Take 500 mcg by mouth daily.    [provider]  diclofenac sodium (VOLTAREN) 1 % GEL Apply 4 g topically 3 (three) times daily. Apply to both knees    [provider]  diphenhydrAMINE (BENADRYL) 25 MG tablet Take 25 mg by mouth every 6 (six) hours as needed for allergies.    [provider]  DULoxetine (CYMBALTA) 60 MG capsule Take 60 mg by mouth daily.    [provider]  ferrous  sulfate 325 (65 FE) MG tablet Take 325 mg by mouth 3 (three) times daily with meals.    [provider]  gabapentin (NEURONTIN) 100 MG capsule Take 200 mg by mouth 2 (two) times daily.    [provider]  guaiFENesin (ROBITUSSIN) 100 MG/5ML SOLN Take 15 mLs by mouth every 4 (four) hours as needed for cough or to loosen phlegm.    [provider]  lacosamide 100 MG TABS Take 1 tablet (100 mg total) by mouth 2 (two) times daily. 02/04/15   Hillary Bow, MD  lamoTRIgine (LAMICTAL) 200 MG tablet Take 200-400 mg by mouth 2 (two) times daily. Pt takes 2 tablets (400 mg) in the morning on Monday, Wednesday and  Friday and only 1 tablet (200 mg) bid on all other days in the morning.    [provider]  levETIRAcetam (KEPPRA) 1000 MG tablet Take 1 tablet (1,000 mg total) by mouth 2 (two) times daily. Patient taking differently: Take 1,500-2,000 mg by mouth daily. Take 2000 mg by mouth in the morning Tuesday, Thursday, Saturday and Sunday and 1500 mg by mouth every Monday, Wednesday and Friday morning. 11/26/15   Daymon Larsen, MD  loperamide (IMODIUM) 2 MG capsule Take 4 mg by mouth as needed for diarrhea or loose stools.    [provider]  magnesium hydroxide (MILK OF MAGNESIA) 400 MG/5ML suspension Take 30 mLs by mouth daily as needed for mild constipation or moderate constipation.    [provider]  meloxicam (MOBIC) 15 MG tablet Take 1 tablet (15 mg total) by mouth daily. 12/23/15   Cuthriell, Charline Bills, PA-C  mometasone-formoterol (DULERA) 100-5 MCG/ACT AERO Inhale 2 puffs into the lungs 2 (two) times daily.    [provider]  mupirocin ointment (BACTROBAN) 2 % Apply 1 application topically 2 (two) times daily. 10/30/16   Edrick Kins, DPM  Neomycin-Bacitracin-Polymyxin (TRIPLE ANTIBIOTIC) [phone] OINT Apply topically daily.    [provider]  NONFORMULARY OR COMPOUNDED ITEM Apply 1-2 g topically 4 (four) times daily. 04/03/16   Edrick Kins, DPM  ondansetron (ZOFRAN ODT) 4 MG disintegrating tablet Take 1 tablet (4 mg total) by mouth every 8 (eight) hours as needed for nausea or vomiting. 07/21/16   Earleen Newport, MD  pantoprazole (PROTONIX) 40 MG tablet Take 40 mg by mouth daily before breakfast.     [provider]  phenytoin (DILANTIN) 100 MG ER capsule Take 100 mg by mouth 2 (two) times daily.     [provider]  Powders (GOLD BOND BABY POWDER EX) Apply topically daily as needed. For dry itchy skin    [provider]  pyrithione zinc (HEAD AND SHOULDERS) 1 % shampoo Apply topically daily as needed for itching.     [provider]  traMADol (ULTRAM) 50 MG tablet Take 50 mg by mouth 2 (two) times daily as needed for moderate pain.    [provider]    Allergies Ciprofloxacin and Aspirin  History reviewed. No pertinent family history.  Social History Social History   Tobacco Use  . Smoking status: Current Every Day Smoker    Packs/day: 0.50    Types: Cigarettes  . Smokeless tobacco: Never Used  Substance Use Topics  . Alcohol use: No  . Drug use: No    Review of Systems  Constitutional: No fever/chills. Eyes: No visual changes. ENT: No sore throat. Cardiovascular: Positive for left rib pain. Respiratory: Denies shortness of breath. Gastrointestinal: No abdominal pain.  No nausea, no vomiting.  No diarrhea.  No constipation. Genitourinary: Negative for dysuria. Musculoskeletal: Positive for left forearm pain.  Negative for back pain. Skin: Negative for rash. Neurological: Negative for headaches, focal weakness or numbness.   ____________________________________________   PHYSICAL EXAM:  VITAL SIGNS: ED Triage Vitals  Enc Vitals Group     BP 07/25/17 0320 (!) 147/82     Pulse Rate 07/25/17 0320 (!) 57     Resp 07/25/17 0320 (!) 22     Temp 07/25/17 0320 97.7 F (36.5 C)     Temp Source 07/25/17 0320 Oral     SpO2 07/25/17 0320 98 %     Weight 07/25/17 0322 183 lb (83 kg)     Height 07/25/17 0322 5\' 2"  (1.575 m)     Head Circumference --      Peak Flow --      Pain Score 07/25/17 0322 10     Pain Loc --      Pain Edu? --      Excl. in [place]? --     Constitutional: Alert and oriented. Well appearing and in mild acute distress. Eyes: Conjunctivae are normal. PERRL. EOMI. Head: Atraumatic. Nose: No congestion/rhinnorhea. Mouth/Throat: Mucous membranes are moist.  Oropharynx non-erythematous. Neck: No stridor.  No cervical spine tenderness to palpation. Cardiovascular: Normal rate, regular rhythm. Grossly normal heart sounds.  Good peripheral  circulation. Respiratory: Normal respiratory effort.  No retractions. Lungs CTAB.  No splinting.  No crepitus.  Left lower and lateral ribs tender to palpation. Gastrointestinal: Soft and nontender. No distention. No abdominal bruits. No CVA tenderness. Musculoskeletal:  LUE: No deformity noted.  Midshaft forearm tender to palpation.  2+ radial pulses.  Brisk, less than 5-second capillary refill.  No lower extremity tenderness nor edema.  No joint effusions. Neurologic:  Normal speech and language. No gross focal neurologic deficits are appreciated.  Skin:  Skin is warm, dry and intact. No rash noted. Psychiatric: Mood and affect are normal. Speech and behavior are normal.  ____________________________________________   LABS (all labs ordered are listed, but only abnormal results are displayed)  Labs Reviewed - No data to display ____________________________________________  EKG  None ____________________________________________  RADIOLOGY  ED MD interpretation: No acute traumatic injuries  Official radiology report(s): Dg Ribs Unilateral W/chest Left  Result Date: 07/25/2017 CLINICAL DATA:  Left rib pain after fall. EXAM: LEFT RIBS AND CHEST - 3+ VIEW COMPARISON:  Radiographs and CT 08/22/2015 FINDINGS: No fracture or other bone lesions are seen involving the ribs. There is no evidence of pneumothorax or pleural effusion. Both lungs are clear. Borderline cardiomegaly with unchanged mediastinal contours. Aortic atherosclerosis. Retrocardiac hiatal hernia. IMPRESSION: No rib fracture or pulmonary complication. Electronically Signed   By: Jeb Levering M.D.   On: 07/25/2017 05:09   Dg Forearm Left  Result Date: 07/25/2017 CLINICAL DATA:  Left forearm pain after fall. EXAM: LEFT FOREARM - 2 VIEW COMPARISON:  None. FINDINGS: There is no evidence of fracture or other focal bone lesions. Wrist and elbow alignment is maintained. 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 Soft tissues are  unremarkable. IMPRESSION: No fracture of the left forearm. Electronically Signed   By: Jeb Levering M.D.   On: 07/25/2017 05:07   Ct Head Wo Contrast  Result Date: 07/25/2017 CLINICAL DATA:  Fall over walker, pain and headache.  On Plavix. EXAM: CT HEAD WITHOUT CONTRAST TECHNIQUE: Contiguous axial images were obtained from the base of the skull through the vertex without intravenous contrast.  COMPARISON:  Head CT 05/11/2017 FINDINGS: Brain: No intracranial hemorrhage, mass effect, or midline shift. Unchanged generalized atrophy. Again seen right temporal encephalomalacia. No hydrocephalus. The basilar cisterns are patent. No evidence of territorial infarct or acute ischemia. No extra-axial or intracranial fluid collection. Vascular: Atherosclerosis of skullbase vasculature without hyperdense vessel or abnormal calcification. Skull: No acute fracture. Remote fracture and or postsurgical change of the right temporal bone. Sinuses/Orbits: No acute abnormality. Remote postsurgical/posttraumatic deformity of the right orbit. Other: None. IMPRESSION: 1.  No acute intracranial abnormality. 2. Unchanged atrophy and right temporal encephalomalacia. Electronically Signed   By: Jeb Levering M.D.   On: 07/25/2017 04:27    ____________________________________________   PROCEDURES  Procedure(s) performed: None  Procedures  Critical Care performed: No  ____________________________________________   INITIAL IMPRESSION / ASSESSMENT AND PLAN / ED COURSE  As part of my medical decision making, I reviewed the following data within the [facility] notes reviewed and incorporated, Old chart reviewed, Radiograph reviewed and Notes from prior ED visits.   62 year old female who presents status post mechanical fall with head pain, left forearm and left rib pain.  Will obtain CT and plain film imaging studies, administer analgesia and reassess.  Clinical Course as of Jul 26 515    Thu Jul 25, 2017  8527 Updated patient of negative imaging results.  Strict return precautions given.  Patient verbalizes understanding and agrees with plan of care.  [JS]    Clinical Course User Index [JS] Paulette Blanch, MD     ____________________________________________   FINAL CLINICAL IMPRESSION(S) / ED DIAGNOSES  Final diagnoses:  Fall, initial encounter  Contusion of left forearm, initial encounter  Rib contusion, left, initial encounter     ED Discharge Orders    None       Note:  This document was prepared using Dragon voice recognition software and may include unintentional dictation errors.    Paulette Blanch, MD 07/25/17 [phone]

## 2017-07-25 NOTE — ED Notes (Addendum)
Pt was seated and reached up to get a blanket and fell over her walker. Hit her left side and left arm. Her room mate thinks she may have hit her head. Pt is unsure. She complains of headache, l forearm pain and left side pain. Areas of injury positioned for pts comfort

## 2017-07-25 NOTE — ED Notes (Signed)
NAD noted at time of D/C. Pt taken to the lobby via wheelchair by this RN. Pt D/C into the care of transport from the [facility] for [place].

## 2017-07-25 NOTE — Discharge Instructions (Signed)
1.  You may take Norco as needed for pain. 2.  Apply ice to affected area several times daily. 3.  Return to the ER for worsening symptoms, persistent vomiting, difficulty breathing or other concerns. 

## 2017-07-25 NOTE — ED Notes (Signed)
Pt assisted to the bathroom by this RN. Pt tolerated well. Explained to patient awaiting transport. Pt states understanding. Will continue to monitor for further patient needs.

## 2017-08-02 ENCOUNTER — Other Ambulatory Visit: Payer: Self-pay | Admitting: Physician Assistant

## 2017-08-04 ENCOUNTER — Emergency Department: Payer: Medicaid Other

## 2017-08-04 ENCOUNTER — Other Ambulatory Visit: Payer: Self-pay

## 2017-08-04 ENCOUNTER — Encounter: Payer: Self-pay | Admitting: Emergency Medicine

## 2017-08-04 ENCOUNTER — Emergency Department
Admission: EM | Admit: 2017-08-04 | Discharge: 2017-08-04 | Disposition: A | Discharge status: Home / Self Care | Payer: Medicaid Other | Attending: Emergency Medicine | Admitting: Emergency Medicine

## 2017-08-04 DIAGNOSIS — R29810 Facial weakness: Secondary | ICD-10-CM | POA: Insufficient documentation

## 2017-08-04 DIAGNOSIS — Z7902 Long term (current) use of antithrombotics/antiplatelets: Secondary | ICD-10-CM | POA: Diagnosis not present

## 2017-08-04 DIAGNOSIS — Y999 Unspecified external cause status: Secondary | ICD-10-CM | POA: Insufficient documentation

## 2017-08-04 DIAGNOSIS — Y9389 Activity, other specified: Secondary | ICD-10-CM | POA: Diagnosis not present

## 2017-08-04 DIAGNOSIS — G40909 Epilepsy, unspecified, not intractable, without status epilepticus: Secondary | ICD-10-CM | POA: Diagnosis not present

## 2017-08-04 DIAGNOSIS — Y92128 Other place in nursing home as the place of occurrence of the external cause: Secondary | ICD-10-CM | POA: Diagnosis not present

## 2017-08-04 DIAGNOSIS — J449 Chronic obstructive pulmonary disease, unspecified: Secondary | ICD-10-CM | POA: Diagnosis not present

## 2017-08-04 DIAGNOSIS — Z79899 Other long term (current) drug therapy: Secondary | ICD-10-CM | POA: Diagnosis not present

## 2017-08-04 DIAGNOSIS — J45909 Unspecified asthma, uncomplicated: Secondary | ICD-10-CM | POA: Diagnosis not present

## 2017-08-04 DIAGNOSIS — F17210 Nicotine dependence, cigarettes, uncomplicated: Secondary | ICD-10-CM | POA: Insufficient documentation

## 2017-08-04 DIAGNOSIS — Y92009 Unspecified place in unspecified non-institutional (private) residence as the place of occurrence of the external cause: Secondary | ICD-10-CM

## 2017-08-04 DIAGNOSIS — M25519 Pain in unspecified shoulder: Secondary | ICD-10-CM | POA: Diagnosis present

## 2017-08-04 DIAGNOSIS — W19XXXA Unspecified fall, initial encounter: Secondary | ICD-10-CM | POA: Diagnosis not present

## 2017-08-04 LAB — CBC
HCT: 39.7 % (ref 35.0–47.0)
Hemoglobin: 13.6 g/dL (ref 12.0–16.0)
MCH: 34.5 pg — ABNORMAL HIGH (ref 26.0–34.0)
MCHC: 34.1 g/dL (ref 32.0–36.0)
MCV: 101 fL — AB (ref 80.0–100.0)
PLATELETS: 193 10*3/uL (ref 150–440)
RBC: 3.94 MIL/uL (ref 3.80–5.20)
RDW: 13.5 % (ref 11.5–14.5)
WBC: 3.7 10*3/uL (ref 3.6–11.0)

## 2017-08-04 LAB — BASIC METABOLIC PANEL
Anion gap: 9 (ref 5–15)
BUN: 11 mg/dL (ref 6–20)
CALCIUM: 8.9 mg/dL (ref 8.9–10.3)
CHLORIDE: 106 mmol/L (ref 101–111)
CO2: 26 mmol/L (ref 22–32)
CREATININE: 0.81 mg/dL (ref 0.44–1.00)
GFR calc Af Amer: >60 mL/min (ref >60)
GFR calc non Af Amer: >60 mL/min (ref >60)
Glucose, Bld: 93 mg/dL (ref 65–99)
Potassium: 3.9 mmol/L (ref 3.5–5.1)
Sodium: 141 mmol/L (ref 135–145)

## 2017-08-04 LAB — URINALYSIS, COMPLETE (UACMP) WITH MICROSCOPIC
Bilirubin Urine: NEGATIVE
GLUCOSE, UA: NEGATIVE mg/dL
KETONES UR: NEGATIVE mg/dL
LEUKOCYTES UA: NEGATIVE
Nitrite: NEGATIVE
PROTEIN: NEGATIVE mg/dL
Specific Gravity, Urine: 1.005 (ref 1.005–1.030)
pH: 7 (ref 5.0–8.0)

## 2017-08-04 LAB — TROPONIN I: Troponin I: <0.03 ng/mL (ref <0.03)

## 2017-08-04 LAB — PHENYTOIN LEVEL, TOTAL: Phenytoin Lvl: 15.2 ug/mL (ref 10.0–20.0)

## 2017-08-04 NOTE — ED Notes (Signed)
Placed on  bedpan

## 2017-08-04 NOTE — ED Notes (Signed)
Placed in wheelchair in lobby to wait for niece. First nurse notified.

## 2017-08-04 NOTE — Discharge Instructions (Signed)
Your workup today in the emergency department is normal including lab work, urinalysis chest x-ray and a CT scan of your head.  Please follow-up with your doctor in the next several days for recheck/reevaluation.  Return to the emergency department for any personally concerning symptoms.

## 2017-08-04 NOTE — ED Notes (Addendum)
Niece will pick pt up in 1 hr, will discharge when she arrives.

## 2017-08-04 NOTE — ED Notes (Signed)
Report to brittany at the [facility] for when pt returns.

## 2017-08-04 NOTE — ED Notes (Addendum)
Dr schaevitz aware of pt 

## 2017-08-04 NOTE — ED Notes (Signed)
Patient transported to CT 

## 2017-08-04 NOTE — ED Notes (Addendum)
Spoke with brittany at the [facility] who cares for pt.  She reports pt was fine at 0845 when gave meds.  She found her on floor about 1000.  Per brittany pt seemed like she did after seizure; "she would look at me but not respond".  Pt has silent seizures per brittany. [place] feels like facial droop is slightly more than normal when she found on floor despite pt feeling she is same as normal.

## 2017-08-04 NOTE — ED Triage Notes (Signed)
Pt had unwitnessed fall this AM and was found by door. From the [facility]. Does have seizure hx but feels different than seizure after, no aura prior. Has had some intermittent chest pain over last month, none now.  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  Speech clear. Is on plavix, denies headache but feels like hit head.

## 2017-08-04 NOTE — ED Notes (Signed)
Patient transported to X-ray 

## 2017-08-04 NOTE — ED Provider Notes (Signed)
[facility] Emergency Department Provider Note  Time seen: 11:12 AM  I have reviewed the triage vital signs and the nursing notes.   HISTORY  Chief Complaint Fall    HPI Marcia Brown is a 62 y.o. female with a past medical history of neuropathy, seizure disorder, depression, COPD, anxiety, presents to the emergency department after a fall from her nursing facility.  According to report the patient was found down on the ground, patient does not remember falling.  It is unclear if she had a seizure, syncopal episode or mechanical fall.  Patient does state mild pain in both of her shoulders as her only complaint.  Nursing home staff states the patient had a right facial droop which is baseline but possibly a little worse today.  Patient states it is normal for her.  Denies any headache.  Denies any increase in weakness or numbness from her baseline which she states has mild right leg weakness at baseline.  Largely negative review of systems.  Past Medical History:  Diagnosis Date  . Anxiety   . Asthma   . COPD (chronic obstructive pulmonary disease) ([place])   . Depression   . High cholesterol   . Polyneuropathy   . Seizures ([place])   . Sleep apnea     Patient Active Problem List   Diagnosis Date Noted  . Seizure ([place]) 02/03/2015    Past Surgical History:  Procedure Laterality Date  . ESOPHAGEAL DILATION    . FOOT SURGERY Right     Prior to Admission medications   Medication Sig Start Date End Date Taking? Authorizing Provider  acetaminophen (MAPAP) 500 MG tablet Take 500 mg by mouth every 6 (six) hours as needed for mild pain or moderate pain.    [provider]  acetaminophen (TYLENOL) 325 MG tablet Take 325 mg by mouth every 4 (four) hours as needed for mild pain. Take 2 tablets (650 mg) by mouth every 4 hours daily as needed    [provider]  albuterol (PROVENTIL HFA;VENTOLIN HFA) 108 (90 BASE) MCG/ACT inhaler Inhale 2 puffs into the  lungs every 4 (four) hours as needed for wheezing or shortness of breath.    [provider]  alum & mag hydroxide-simeth (ANTACID LIQUID) 200-200-20 MG/5ML suspension Take 30 mLs by mouth every 6 (six) hours as needed for indigestion or heartburn.    [provider]  atorvastatin (LIPITOR) 10 MG tablet Take 10 mg by mouth at bedtime.     [provider]  barrier cream (NON-SPECIFIED) CREA Apply 1 application topically as needed. Apply topically after toileting as needed to prevent skin breakdown    [provider]  cholecalciferol (VITAMIN D) 1000 units tablet Take 2,000 Units by mouth daily. Give with Vitamin D3 400 units    [provider]  cholecalciferol (VITAMIN D) 400 UNITS TABS tablet Take 400 Units by mouth daily. Give with Vitamin D3 2000 units    [provider]  clonazePAM (KLONOPIN) 0.5 MG tablet Take 0.5 mg by mouth 3 (three) times daily.     [provider]  clopidogrel (PLAVIX) 75 MG tablet Take 1 tablet (75 mg total) by mouth daily. 11/14/16 11/14/17  Darel Hong, MD  cyanocobalamin 500 MCG tablet Take 500 mcg by mouth daily.    [provider]  diclofenac sodium (VOLTAREN) 1 % GEL Apply 4 g topically 3 (three) times daily. Apply to both knees    [provider]  diphenhydrAMINE (BENADRYL) 25 MG  tablet Take 25 mg by mouth every 6 (six) hours as needed for allergies.    [provider]  DULoxetine (CYMBALTA) 60 MG capsule Take 60 mg by mouth daily.    [provider]  ferrous sulfate 325 (65 FE) MG tablet Take 325 mg by mouth 3 (three) times daily with meals.    [provider]  gabapentin (NEURONTIN) 100 MG capsule Take 200 mg by mouth 2 (two) times daily.    [provider]  guaiFENesin (ROBITUSSIN) 100 MG/5ML SOLN Take 15 mLs by mouth every 4 (four) hours as needed for cough or to loosen phlegm.    [provider]  HYDROcodone-acetaminophen (NORCO) 5-325 MG  tablet Take 1 tablet by mouth every 6 (six) hours as needed for moderate pain. 07/25/17   Paulette Blanch, MD  lacosamide 100 MG TABS Take 1 tablet (100 mg total) by mouth 2 (two) times daily. 02/04/15   Hillary Bow, MD  lamoTRIgine (LAMICTAL) 200 MG tablet Take 200-400 mg by mouth 2 (two) times daily. Pt takes 2 tablets (400 mg) in the morning on Monday, Wednesday and Friday and only 1 tablet (200 mg) bid on all other days in the morning.    [provider]  levETIRAcetam (KEPPRA) 1000 MG tablet Take 1 tablet (1,000 mg total) by mouth 2 (two) times daily. Patient taking differently: Take 1,500-2,000 mg by mouth daily. Take 2000 mg by mouth in the morning Tuesday, Thursday, Saturday and Sunday and 1500 mg by mouth every Monday, Wednesday and Friday morning. 11/26/15   Daymon Larsen, MD  loperamide (IMODIUM) 2 MG capsule Take 4 mg by mouth as needed for diarrhea or loose stools.    [provider]  magnesium hydroxide (MILK OF MAGNESIA) 400 MG/5ML suspension Take 30 mLs by mouth daily as needed for mild constipation or moderate constipation.    [provider]  meloxicam (MOBIC) 15 MG tablet Take 1 tablet (15 mg total) by mouth daily. 12/23/15   Cuthriell, Charline Bills, PA-C  mometasone-formoterol (DULERA) 100-5 MCG/ACT AERO Inhale 2 puffs into the lungs 2 (two) times daily.    [provider]  mupirocin ointment (BACTROBAN) 2 % Apply 1 application topically 2 (two) times daily. 10/30/16   Edrick Kins, DPM  Neomycin-Bacitracin-Polymyxin (TRIPLE ANTIBIOTIC) [phone] OINT Apply topically daily.    [provider]  NONFORMULARY OR COMPOUNDED ITEM Apply 1-2 g topically 4 (four) times daily. 04/03/16   Edrick Kins, DPM  ondansetron (ZOFRAN ODT) 4 MG disintegrating tablet Take 1 tablet (4 mg total) by mouth every 8 (eight) hours as needed for nausea or vomiting. 07/21/16   Earleen Newport, MD  pantoprazole (PROTONIX) 40 MG tablet Take 40 mg by mouth daily  before breakfast.     [provider]  phenytoin (DILANTIN) 100 MG ER capsule Take 100 mg by mouth 2 (two) times daily.     [provider]  Powders (GOLD BOND BABY POWDER EX) Apply topically daily as needed. For dry itchy skin    [provider]  pyrithione zinc (HEAD AND SHOULDERS) 1 % shampoo Apply topically daily as needed for itching.    [provider]  traMADol (ULTRAM) 50 MG tablet Take 50 mg by mouth 2 (two) times daily as needed for moderate pain.    [provider]    Allergies  Allergen Reactions  . Ciprofloxacin Other (See Comments)    Other Reaction: Lowered seizure threshold  . Aspirin Other (See Comments)  Reaction: Unknown    History reviewed. No pertinent family history.  Social History Social History   Tobacco Use  . Smoking status: Current Every Day Smoker    Packs/day: 0.50    Types: Cigarettes  . Smokeless tobacco: Never Used  Substance Use Topics  . Alcohol use: No  . Drug use: No    Review of Systems Constitutional: Negative for fever. Eyes: Negative for visual complaints ENT: Negative for recent illness/congestion Cardiovascular: Negative for chest pain. Respiratory: Negative for shortness of breath. Gastrointestinal: Negative for abdominal pain, vomiting and diarrhea. Genitourinary: Negative for urinary compaints Musculoskeletal: Bilateral shoulder pain, mild. Skin: Negative for skin complaints  Neurological: Negative for headache.  Right facial droop is normal for the patient per patient. All other ROS negative  ____________________________________________   PHYSICAL EXAM:  Constitutional: Alert and oriented. Well appearing and in no distress. Eyes: Normal exam ENT   Head: Normocephalic and atraumatic.   Mouth/Throat: Mucous membranes are moist. Cardiovascular: Normal rate, regular rhythm. No murmur Respiratory: Normal respiratory effort without tachypnea nor retractions. Breath  sounds are clear  Gastrointestinal: Soft and nontender. No distention.   Musculoskeletal: Nontender with normal range of motion in all extremities.  Neurologic: Normal speech and language.  Oriented x4.  Patient has mild right facial droop which she states is baseline.  Good equal grip strengths, no pronator drift.  Slightly more weak in the right lower extremity which the patient states is baseline as well.  Sensation is intact and equal. Skin:  Skin is warm, dry and intact.  Psychiatric: Mood and affect are normal.  ____________________________________________    EKG  EKG reviewed and interpreted by myself shows normal sinus rhythm at 55 bpm with a narrow QRS, normal axis, normal intervals, no concerning ST changes.  ____________________________________________    RADIOLOGY  CT shows no acute abnormality.  Stable right encephalomalacia.  ____________________________________________   INITIAL IMPRESSION / ASSESSMENT AND PLAN / ED COURSE  Pertinent labs & imaging results that were available during my care of the patient were reviewed by me and considered in my medical decision making (see chart for details).  Patient presents to the emergency department after a fall at her nursing facility.  Differential would include seizure, syncope, CVA, mechanical fall.  Overall the patient appears very well, she does have a right facial droop which she states is baseline and unchanged.  Patient is answering questions appropriately, oriented x4.  We will check labs, CT scan of the head, EKG and continue to closely monitor. Patient CT scan of the head shows no acute abnormality with chronic but stable right encephalomalacia.  EKG is reassuring.  Patient is already on Plavix for anticoagulation.  Do not suspect CVA.  Awaiting labs including cardiac panel.  Patient's labs are largely within normal limits.  Slight amount of blood in her urinalysis although this was a cath specimen so could be  traumatic.  Patient denies any hematuria or dysuria.  With normal labs and a fall I suspect the patient either had a syncopal episode possible seizure with a normal workup I believe the patient is safe for discharge home.  ____________________________________________   FINAL CLINICAL IMPRESSION(S) / ED DIAGNOSES  Cheral Marker, MD 08/04/17 1347

## 2017-08-04 NOTE — ED Notes (Signed)
NAD. Unlabored. Pt has no needs at this time. Waiting on UA results for disposition.

## 2017-08-05 ENCOUNTER — Other Ambulatory Visit: Payer: Self-pay | Admitting: Physician Assistant

## 2017-08-05 DIAGNOSIS — R1031 Right lower quadrant pain: Secondary | ICD-10-CM

## 2017-08-14 ENCOUNTER — Ambulatory Visit
Admission: RE | Admit: 2017-08-14 | Discharge: 2017-08-14 | Disposition: A | Discharge status: Home / Self Care | Payer: Medicaid Other | Source: Ambulatory Visit | Attending: Physician Assistant | Admitting: Physician Assistant

## 2017-08-20 ENCOUNTER — Other Ambulatory Visit: Payer: Self-pay | Admitting: Physician Assistant

## 2017-08-21 ENCOUNTER — Ambulatory Visit
Admission: RE | Admit: 2017-08-21 | Discharge: 2017-08-21 | Disposition: A | Discharge status: Home / Self Care | Payer: Medicaid Other | Source: Ambulatory Visit | Attending: Physician Assistant | Admitting: Physician Assistant

## 2017-08-21 DIAGNOSIS — R1031 Right lower quadrant pain: Secondary | ICD-10-CM | POA: Insufficient documentation

## 2017-08-21 MED ORDER — IOPAMIDOL (ISOVUE-300) INJECTION 61%
100.0000 mL | Freq: Once | INTRAVENOUS | Status: AC | PRN
  Administered 2017-08-21: 100 mL via INTRAVENOUS

## 2017-09-11 ENCOUNTER — Other Ambulatory Visit: Payer: Self-pay | Admitting: Physician Assistant

## 2017-09-11 DIAGNOSIS — R599 Enlarged lymph nodes, unspecified: Secondary | ICD-10-CM

## 2017-09-24 ENCOUNTER — Ambulatory Visit
Admission: RE | Admit: 2017-09-24 | Discharge: 2017-09-24 | Disposition: A | Discharge status: Home / Self Care | Payer: Medicaid Other | Source: Ambulatory Visit | Attending: Physician Assistant | Admitting: Physician Assistant

## 2017-09-24 DIAGNOSIS — R599 Enlarged lymph nodes, unspecified: Secondary | ICD-10-CM

## 2017-09-24 DIAGNOSIS — N644 Mastodynia: Secondary | ICD-10-CM | POA: Diagnosis not present

## 2017-09-24 DIAGNOSIS — R590 Localized enlarged lymph nodes: Secondary | ICD-10-CM | POA: Insufficient documentation

## 2018-04-02 ENCOUNTER — Encounter: Payer: Self-pay | Admitting: Emergency Medicine

## 2018-04-02 ENCOUNTER — Other Ambulatory Visit: Payer: Self-pay

## 2018-04-02 DIAGNOSIS — F419 Anxiety disorder, unspecified: Secondary | ICD-10-CM | POA: Diagnosis not present

## 2018-04-02 DIAGNOSIS — J449 Chronic obstructive pulmonary disease, unspecified: Secondary | ICD-10-CM | POA: Diagnosis not present

## 2018-04-02 DIAGNOSIS — Z79899 Other long term (current) drug therapy: Secondary | ICD-10-CM | POA: Diagnosis not present

## 2018-04-02 DIAGNOSIS — F17210 Nicotine dependence, cigarettes, uncomplicated: Secondary | ICD-10-CM | POA: Insufficient documentation

## 2018-04-02 DIAGNOSIS — J45909 Unspecified asthma, uncomplicated: Secondary | ICD-10-CM | POA: Insufficient documentation

## 2018-04-02 DIAGNOSIS — F329 Major depressive disorder, single episode, unspecified: Secondary | ICD-10-CM | POA: Diagnosis not present

## 2018-04-02 DIAGNOSIS — M7918 Myalgia, other site: Secondary | ICD-10-CM | POA: Insufficient documentation

## 2018-04-02 DIAGNOSIS — M545 Low back pain: Secondary | ICD-10-CM | POA: Insufficient documentation

## 2018-04-02 NOTE — ED Triage Notes (Addendum)
Pt to triage via w/c with no distress noted, brought in by EMS; st fell getting out of shower c/o back & buttock pain; denies hitting head; no cervical tenderness with palpation; pt also c/o bilat leg pain but st that is baseline for her, now from the fall

## 2018-04-03 ENCOUNTER — Emergency Department
Admission: EM | Admit: 2018-04-03 | Discharge: 2018-04-03 | Disposition: A | Discharge status: Home / Self Care | Payer: Medicaid Other | Attending: Emergency Medicine | Admitting: Emergency Medicine

## 2018-04-03 ENCOUNTER — Emergency Department: Payer: Medicaid Other

## 2018-04-03 DIAGNOSIS — W19XXXA Unspecified fall, initial encounter: Secondary | ICD-10-CM

## 2018-04-03 DIAGNOSIS — M545 Low back pain, unspecified: Secondary | ICD-10-CM

## 2018-04-03 DIAGNOSIS — M7918 Myalgia, other site: Secondary | ICD-10-CM

## 2018-04-03 NOTE — ED Notes (Signed)
Pt to ED reporting having sl;ipped on the tile at while changing this evening. Pt denies having hit her head. Pain in buttocks, left side and back. No deformities noted. No decreased ROM.

## 2018-04-03 NOTE — ED Notes (Signed)
Pt is a resident of [facility]

## 2018-04-03 NOTE — ED Notes (Signed)
Ms Marcia Brown from the [facility] states [name] who does transport will be here at 8am to pick her up from the lobby

## 2018-04-03 NOTE — ED Provider Notes (Signed)
[facility] Emergency Department Provider Note  ____________________________________________   First MD Initiated Contact with Patient 04/03/18 [id number]     (approximate)  I have reviewed the triage vital signs and the nursing notes.   HISTORY  Chief Complaint Fall    HPI Marcia Brown is a 62 y.o. female with extensive past medical history who presents for evaluation of pain in her lower back and buttocks after a fall.  She reports that she had a mechanical fall which she says is common for her.  She says that she tripped and lost her balance and landed on her buttocks.  She has some chronic back pain radiating down both legs at baseline and says that it is worse now as result the fall.  Moving around makes it worse and nothing particular makes it better.  She has no focal numbness or weakness in her extremities.  The pain is mostly on the sides of her lower back, not in the midline.  She denies striking her head and loss of consciousness.  She denies chest pain, shortness of breath, nausea, vomiting, and abdominal pain.  Past Medical History:  Diagnosis Date  . Anxiety   . Asthma   . COPD (chronic obstructive pulmonary disease) ([place])   . Depression   . High cholesterol   . Polyneuropathy   . Seizures ([place])   . Sleep apnea     Patient Active Problem List   Diagnosis Date Noted  . Seizure ([place]) 02/03/2015    Past Surgical History:  Procedure Laterality Date  . ESOPHAGEAL DILATION    . FOOT SURGERY Right     Prior to Admission medications   Medication Sig Start Date End Date Taking? Authorizing Provider  acetaminophen (MAPAP) 500 MG tablet Take 500 mg by mouth every 6 (six) hours as needed for mild pain or moderate pain.    [provider]  acetaminophen (TYLENOL) 325 MG tablet Take 325 mg by mouth every 4 (four) hours as needed for mild pain. Take 2 tablets (650 mg) by mouth every 4 hours daily as needed    [provider]    albuterol (PROVENTIL HFA;VENTOLIN HFA) 108 (90 BASE) MCG/ACT inhaler Inhale 2 puffs into the lungs every 4 (four) hours as needed for wheezing or shortness of breath.    [provider]  alum & mag hydroxide-simeth (ANTACID LIQUID) 200-200-20 MG/5ML suspension Take 30 mLs by mouth every 6 (six) hours as needed for indigestion or heartburn.    [provider]  atorvastatin (LIPITOR) 10 MG tablet Take 10 mg by mouth at bedtime.     [provider]  barrier cream (NON-SPECIFIED) CREA Apply 1 application topically as needed. Apply topically after toileting as needed to prevent skin breakdown    [provider]  cholecalciferol (VITAMIN D) 1000 units tablet Take 2,000 Units by mouth daily. Give with Vitamin D3 400 units    [provider]  cholecalciferol (VITAMIN D) 400 UNITS TABS tablet Take 400 Units by mouth daily. Give with Vitamin D3 2000 units    [provider]  clonazePAM (KLONOPIN) 0.5 MG tablet Take 0.5 mg by mouth 3 (three) times daily.     [provider]  cyanocobalamin 500 MCG tablet Take 500 mcg by mouth daily.    [provider]  diclofenac sodium (VOLTAREN) 1 % GEL Apply 4 g topically 3 (three) times daily. Apply to both knees    [provider]  diphenhydrAMINE (BENADRYL) 25 MG  tablet Take 25 mg by mouth every 6 (six) hours as needed for allergies.    [provider]  DULoxetine (CYMBALTA) 60 MG capsule Take 60 mg by mouth daily.    [provider]  ferrous sulfate 325 (65 FE) MG tablet Take 325 mg by mouth 3 (three) times daily with meals.    [provider]  gabapentin (NEURONTIN) 100 MG capsule Take 200 mg by mouth 2 (two) times daily.    [provider]  guaiFENesin (ROBITUSSIN) 100 MG/5ML SOLN Take 15 mLs by mouth every 4 (four) hours as needed for cough or to loosen phlegm.    [provider]  HYDROcodone-acetaminophen (NORCO) 5-325 MG tablet Take 1 tablet  by mouth every 6 (six) hours as needed for moderate pain. 07/25/17   Paulette Blanch, MD  lacosamide 100 MG TABS Take 1 tablet (100 mg total) by mouth 2 (two) times daily. 02/04/15   Hillary Bow, MD  lamoTRIgine (LAMICTAL) 200 MG tablet Take 200-400 mg by mouth 2 (two) times daily. Pt takes 2 tablets (400 mg) in the morning on Monday, Wednesday and Friday and only 1 tablet (200 mg) bid on all other days in the morning.    [provider]  levETIRAcetam (KEPPRA) 1000 MG tablet Take 1 tablet (1,000 mg total) by mouth 2 (two) times daily. Patient taking differently: Take 1,500-2,000 mg by mouth daily. Take 2000 mg by mouth in the morning Tuesday, Thursday, Saturday and Sunday and 1500 mg by mouth every Monday, Wednesday and Friday morning. 11/26/15   Daymon Larsen, MD  loperamide (IMODIUM) 2 MG capsule Take 4 mg by mouth as needed for diarrhea or loose stools.    [provider]  magnesium hydroxide (MILK OF MAGNESIA) 400 MG/5ML suspension Take 30 mLs by mouth daily as needed for mild constipation or moderate constipation.    [provider]  meloxicam (MOBIC) 15 MG tablet Take 1 tablet (15 mg total) by mouth daily. 12/23/15   Cuthriell, Charline Bills, PA-C  mometasone-formoterol (DULERA) 100-5 MCG/ACT AERO Inhale 2 puffs into the lungs 2 (two) times daily.    [provider]  mupirocin ointment (BACTROBAN) 2 % Apply 1 application topically 2 (two) times daily. 10/30/16   Edrick Kins, DPM  Neomycin-Bacitracin-Polymyxin (TRIPLE ANTIBIOTIC) [phone] OINT Apply topically daily.    [provider]  NONFORMULARY OR COMPOUNDED ITEM Apply 1-2 g topically 4 (four) times daily. 04/03/16   Edrick Kins, DPM  ondansetron (ZOFRAN ODT) 4 MG disintegrating tablet Take 1 tablet (4 mg total) by mouth every 8 (eight) hours as needed for nausea or vomiting. 07/21/16   Earleen Newport, MD  pantoprazole (PROTONIX) 40 MG tablet Take 40 mg by mouth daily before breakfast.      [provider]  phenytoin (DILANTIN) 100 MG ER capsule Take 100 mg by mouth 2 (two) times daily.     [provider]  Powders (GOLD BOND BABY POWDER EX) Apply topically daily as needed. For dry itchy skin    [provider]  pyrithione zinc (HEAD AND SHOULDERS) 1 % shampoo Apply topically daily as needed for itching.    [provider]  traMADol (ULTRAM) 50 MG tablet Take 50 mg by mouth 2 (two) times daily as needed for moderate pain.    [provider]    Allergies Ciprofloxacin and Aspirin  Family History  Problem Relation Age of Onset  . Breast cancer Daughter 3    Social History Social  History   Tobacco Use  . Smoking status: Current Every Day Smoker    Packs/day: 0.50    Types: Cigarettes  . Smokeless tobacco: Never Used  Substance Use Topics  . Alcohol use: No  . Drug use: No    Review of Systems Constitutional: No fever/chills Cardiovascular: Denies chest pain. Respiratory: Denies shortness of breath. Gastrointestinal: No abdominal pain.  No nausea, no vomiting.   Genitourinary: Negative for dysuria. Musculoskeletal: Low back and buttock pain as described above. Integumentary: Negative for rash. Neurological: Negative for headaches, focal weakness or numbness.   ____________________________________________   PHYSICAL EXAM:  VITAL SIGNS: ED Triage Vitals  Enc Vitals Group     BP 04/02/18 2315 122/70     Pulse Rate 04/02/18 2315 (!) 56     Resp 04/02/18 2315 18     Temp 04/02/18 2315 98.4 F (36.9 C)     Temp Source 04/02/18 2315 Oral     SpO2 04/02/18 2315 97 %     Weight 04/02/18 2316 90.7 kg (200 lb)     Height --      Head Circumference --      Peak Flow --      Pain Score 04/02/18 2315 9     Pain Loc --      Pain Edu? --      Excl. in [place]? --     Constitutional: Alert and oriented. Well appearing and in no acute distress. Eyes: Conjunctivae are normal.  Head: Atraumatic. Neck: No stridor.  No  meningeal signs.  No cervical spine tenderness to palpation. Cardiovascular: Normal rate, regular rhythm. Good peripheral circulation. Grossly normal heart sounds. Respiratory: Normal respiratory effort.  No retractions. Lungs CTAB. Gastrointestinal: Soft and nontender. No distention.  Musculoskeletal: No lower extremity tenderness nor edema. No gross deformities of extremities.  Some reproducible pain with range of motion of both lower extremities.  No tenderness to palpation down the C, T, nor L spines.  Paraspinal muscle tenderness on bilateral inferior lumbar region.  No tenderness to palpation of the coccyx or sacrum. Neurologic:  Normal speech and language. No gross focal neurologic deficits are appreciated.  Skin:  Skin is warm, dry and intact. No rash noted.   ____________________________________________   LABS (all labs ordered are listed, but only abnormal results are displayed)  Labs Reviewed - No data to display ____________________________________________  EKG  No indication for EKG ____________________________________________  RADIOLOGY   ED MD interpretation: No acute fracture or dislocation on lumbar and sacrum/coccyx radiographs  Official radiology report(s): Dg Lumbar Spine 2-3 Views  Result Date: 04/03/2018 CLINICAL DATA:  Low back and buttock pain after fall EXAM: LUMBAR SPINE - 2-3 VIEW COMPARISON:  None. FINDINGS: Mild diffuse degenerative facet disease. Slight retrolisthesis of L1 on L2. No fracture. SI joints symmetric and unremarkable. IMPRESSION: Degenerative facet disease.  No acute bony abnormality. Electronically Signed   By: Rolm Baptise M.D.   On: 04/03/2018 02:40   Dg Sacrum/coccyx  Result Date: 04/03/2018 CLINICAL DATA:  Pain after fall EXAM: SACRUM AND COCCYX - 2+ VIEW COMPARISON:  Lumbar spine series performed today FINDINGS: SI joints and hip joints are symmetric and unremarkable. No visible sacral or coccygeal fracture. IMPRESSION: No acute  bony abnormality. Electronically Signed   By: Rolm Baptise M.D.   On: 04/03/2018 02:41    ____________________________________________   PROCEDURES  Critical Care performed: No   Procedure(s) performed:   Procedures   ____________________________________________   INITIAL IMPRESSION / ASSESSMENT AND PLAN /  ED COURSE  As part of my medical decision making, I reviewed the following data within the [place] notes reviewed and incorporated, Radiograph reviewed  and Notes from prior ED visits    Low risk low back pain and sacral pain after a mechanical fall.  No indication for further work-up.  The patient has been sleeping comfortably for hours in the emergency department awaiting my evaluation.  She has no evidence of acute severe injury other than musculoskeletal pain on exam.  She is appropriate for discharge back to her facility.  I gave my usual customary return precautions.     ____________________________________________  FINAL CLINICAL IMPRESSION(S) / ED DIAGNOSES  Final diagnoses:  Fall, initial encounter  Bilateral low back pain, unspecified chronicity, unspecified whether sciatica present  Musculoskeletal pain     MEDICATIONS GIVEN DURING THIS VISIT:  Medications - No data to display   ED Discharge Orders    None       Note:  This document was prepared using Dragon voice recognition software and may include unintentional dictation errors.    Hinda Kehr, MD 04/03/18 [phone]

## 2018-04-03 NOTE — Discharge Instructions (Signed)

## 2018-04-03 NOTE — ED Notes (Signed)
Patient transported to X-ray 

## 2018-04-10 ENCOUNTER — Emergency Department
Admission: EM | Admit: 2018-04-10 | Discharge: 2018-04-10 | Disposition: A | Discharge status: Home / Self Care | Payer: Medicaid Other | Attending: Emergency Medicine | Admitting: Emergency Medicine

## 2018-04-10 ENCOUNTER — Emergency Department: Payer: Medicaid Other

## 2018-04-10 DIAGNOSIS — S0990XA Unspecified injury of head, initial encounter: Secondary | ICD-10-CM | POA: Diagnosis present

## 2018-04-10 DIAGNOSIS — J449 Chronic obstructive pulmonary disease, unspecified: Secondary | ICD-10-CM | POA: Insufficient documentation

## 2018-04-10 DIAGNOSIS — S20211A Contusion of right front wall of thorax, initial encounter: Secondary | ICD-10-CM | POA: Insufficient documentation

## 2018-04-10 DIAGNOSIS — Y92121 Bathroom in nursing home as the place of occurrence of the external cause: Secondary | ICD-10-CM | POA: Insufficient documentation

## 2018-04-10 DIAGNOSIS — Z79899 Other long term (current) drug therapy: Secondary | ICD-10-CM | POA: Insufficient documentation

## 2018-04-10 DIAGNOSIS — S8001XA Contusion of right knee, initial encounter: Secondary | ICD-10-CM | POA: Insufficient documentation

## 2018-04-10 DIAGNOSIS — Y999 Unspecified external cause status: Secondary | ICD-10-CM | POA: Diagnosis not present

## 2018-04-10 DIAGNOSIS — S2020XA Contusion of thorax, unspecified, initial encounter: Secondary | ICD-10-CM

## 2018-04-10 DIAGNOSIS — W1811XA Fall from or off toilet without subsequent striking against object, initial encounter: Secondary | ICD-10-CM | POA: Insufficient documentation

## 2018-04-10 DIAGNOSIS — Y93E8 Activity, other personal hygiene: Secondary | ICD-10-CM | POA: Insufficient documentation

## 2018-04-10 DIAGNOSIS — F17210 Nicotine dependence, cigarettes, uncomplicated: Secondary | ICD-10-CM | POA: Insufficient documentation

## 2018-04-10 MED ORDER — OXYCODONE-ACETAMINOPHEN 5-325 MG PO TABS
1.0000 | ORAL_TABLET | Freq: Once | ORAL | Status: AC
  Administered 2018-04-10: 1 via ORAL
  Filled 2018-04-10: qty 1

## 2018-04-10 NOTE — ED Notes (Signed)
Secretary notiified to call ems for transport

## 2018-04-10 NOTE — ED Provider Notes (Signed)
[facility] Emergency Department Provider Note   First MD Initiated Contact with Patient 04/10/18 [phone]     (approximate)  I have reviewed the triage vital signs and the nursing notes.   HISTORY  Chief Complaint Fall    HPI Marcia Brown is a 62 y.o. female with below list of chronic medical conditions presents to the emergency department via EMS following accidental fall with resultant right chest wall knee discomfort.  Patient states that she lost her balance while standing from the toilet and subsequently fell backwards striking her head knee and right chest wall.  Patient states current pain score is 8 out of 10.  Patient denies any visual changes no new findings of weakness numbness nausea or vomiting.  Patient denies any dyspnea  Past Medical History:  Diagnosis Date  . Anxiety   . Asthma   . COPD (chronic obstructive pulmonary disease) ([place])   . Depression   . High cholesterol   . Polyneuropathy   . Seizures ([place])   . Sleep apnea     Patient Active Problem List   Diagnosis Date Noted  . Seizure ([place]) 02/03/2015    Past Surgical History:  Procedure Laterality Date  . ESOPHAGEAL DILATION    . FOOT SURGERY Right     Prior to Admission medications   Medication Sig Start Date End Date Taking? Authorizing Provider  acetaminophen (MAPAP) 500 MG tablet Take 500 mg by mouth every 6 (six) hours as needed for mild pain or moderate pain.    [provider]  acetaminophen (TYLENOL) 325 MG tablet Take 325 mg by mouth every 4 (four) hours as needed for mild pain. Take 2 tablets (650 mg) by mouth every 4 hours daily as needed    [provider]  albuterol (PROVENTIL HFA;VENTOLIN HFA) 108 (90 BASE) MCG/ACT inhaler Inhale 2 puffs into the lungs every 4 (four) hours as needed for wheezing or shortness of breath.    [provider]  alum & mag hydroxide-simeth (ANTACID LIQUID) 200-200-20 MG/5ML suspension Take 30 mLs by mouth every 6  (six) hours as needed for indigestion or heartburn.    [provider]  atorvastatin (LIPITOR) 10 MG tablet Take 10 mg by mouth at bedtime.     [provider]  barrier cream (NON-SPECIFIED) CREA Apply 1 application topically as needed. Apply topically after toileting as needed to prevent skin breakdown    [provider]  cholecalciferol (VITAMIN D) 1000 units tablet Take 2,000 Units by mouth daily. Give with Vitamin D3 400 units    [provider]  cholecalciferol (VITAMIN D) 400 UNITS TABS tablet Take 400 Units by mouth daily. Give with Vitamin D3 2000 units    [provider]  clonazePAM (KLONOPIN) 0.5 MG tablet Take 0.5 mg by mouth 3 (three) times daily.     [provider]  cyanocobalamin 500 MCG tablet Take 500 mcg by mouth daily.    [provider]  diclofenac sodium (VOLTAREN) 1 % GEL Apply 4 g topically 3 (three) times daily. Apply to both knees    [provider]  diphenhydrAMINE (BENADRYL) 25 MG tablet Take 25 mg by mouth every 6 (six) hours as needed for allergies.    [provider]  DULoxetine (CYMBALTA) 60 MG capsule Take 60 mg by mouth daily.    [provider]  ferrous sulfate 325 (65 FE) MG tablet Take 325 mg by mouth 3 (three) times daily with meals.  [provider]  gabapentin (NEURONTIN) 100 MG capsule Take 200 mg by mouth 2 (two) times daily.    [provider]  guaiFENesin (ROBITUSSIN) 100 MG/5ML SOLN Take 15 mLs by mouth every 4 (four) hours as needed for cough or to loosen phlegm.    [provider]  HYDROcodone-acetaminophen (NORCO) 5-325 MG tablet Take 1 tablet by mouth every 6 (six) hours as needed for moderate pain. 07/25/17   Paulette Blanch, MD  lacosamide 100 MG TABS Take 1 tablet (100 mg total) by mouth 2 (two) times daily. 02/04/15   Hillary Bow, MD  lamoTRIgine (LAMICTAL) 200 MG tablet Take 200-400 mg by mouth 2 (two) times daily. Pt takes 2 tablets  (400 mg) in the morning on Monday, Wednesday and Friday and only 1 tablet (200 mg) bid on all other days in the morning.    [provider]  levETIRAcetam (KEPPRA) 1000 MG tablet Take 1 tablet (1,000 mg total) by mouth 2 (two) times daily. Patient taking differently: Take 1,500-2,000 mg by mouth daily. Take 2000 mg by mouth in the morning Tuesday, Thursday, Saturday and Sunday and 1500 mg by mouth every Monday, Wednesday and Friday morning. 11/26/15   Daymon Larsen, MD  loperamide (IMODIUM) 2 MG capsule Take 4 mg by mouth as needed for diarrhea or loose stools.    [provider]  magnesium hydroxide (MILK OF MAGNESIA) 400 MG/5ML suspension Take 30 mLs by mouth daily as needed for mild constipation or moderate constipation.    [provider]  meloxicam (MOBIC) 15 MG tablet Take 1 tablet (15 mg total) by mouth daily. 12/23/15   Cuthriell, Charline Bills, PA-C  mometasone-formoterol (DULERA) 100-5 MCG/ACT AERO Inhale 2 puffs into the lungs 2 (two) times daily.    [provider]  mupirocin ointment (BACTROBAN) 2 % Apply 1 application topically 2 (two) times daily. 10/30/16   Edrick Kins, DPM  Neomycin-Bacitracin-Polymyxin (TRIPLE ANTIBIOTIC) [phone] OINT Apply topically daily.    [provider]  NONFORMULARY OR COMPOUNDED ITEM Apply 1-2 g topically 4 (four) times daily. 04/03/16   Edrick Kins, DPM  ondansetron (ZOFRAN ODT) 4 MG disintegrating tablet Take 1 tablet (4 mg total) by mouth every 8 (eight) hours as needed for nausea or vomiting. 07/21/16   Earleen Newport, MD  pantoprazole (PROTONIX) 40 MG tablet Take 40 mg by mouth daily before breakfast.     [provider]  phenytoin (DILANTIN) 100 MG ER capsule Take 100 mg by mouth 2 (two) times daily.     [provider]  Powders (GOLD BOND BABY POWDER EX) Apply topically daily as needed. For dry itchy skin    [provider]  pyrithione zinc (HEAD AND SHOULDERS) 1 %  shampoo Apply topically daily as needed for itching.    [provider]  traMADol (ULTRAM) 50 MG tablet Take 50 mg by mouth 2 (two) times daily as needed for moderate pain.    [provider]    Allergies Ciprofloxacin and Aspirin  Family History  Problem Relation Age of Onset  . Breast cancer Daughter 94    Social History Social History   Tobacco Use  . Smoking status: Current Every Day Smoker    Packs/day: 0.50    Types: Cigarettes  . Smokeless tobacco: Never Used  Substance Use Topics  . Alcohol use: No  . Drug use: No    Review of Systems Constitutional: No fever/chills Eyes: No visual changes. ENT: No sore throat.  Cardiovascular: Denies chest pain. Respiratory: Denies shortness of breath. Gastrointestinal: No abdominal pain.  No nausea, no vomiting.  No diarrhea.  No constipation. Genitourinary: Negative for dysuria. Musculoskeletal: Negative for neck pain.  Negative for back pain.  Positive for right pain Integumentary: Negative for rash. Neurological: Negative for headaches, focal weakness or numbness.   ____________________________________________   PHYSICAL EXAM:  VITAL SIGNS: ED Triage Vitals  Enc Vitals Group     BP 04/10/18 0515 129/74     Pulse Rate 04/10/18 0515 (!) 55     Resp 04/10/18 1033 15     Temp 04/10/18 0515 98.7 F (37.1 C)     Temp Source 04/10/18 0515 Oral     SpO2 04/10/18 0515 97 %     Weight 04/10/18 0517 90 kg (198 lb 6.6 oz)     Height 04/10/18 0517 1.626 m (5\' 4" )     Head Circumference --      Peak Flow --      Pain Score 04/10/18 0516 10     Pain Loc --      Pain Edu? --      Excl. in [place]? --     Constitutional: Alert and oriented. Well appearing and in no acute distress. Eyes: Conjunctivae are normal. PERRL. EOMI. Head: Atraumatic. Mouth/Throat: Mucous membranes are moist.  Oropharynx non-erythematous. Neck: No stridor.  No meningeal signs.  No cervical spine tenderness to palpation. Chest: Pain  with palpation diffuse right chest wall, no crepitance Cardiovascular: Normal rate, regular rhythm. Good peripheral circulation. Grossly normal heart sounds. Respiratory: Normal respiratory effort.  No retractions. Lungs CTAB. Gastrointestinal: Soft and nontender. No distention.  Musculoskeletal: Ecchymosis noted inferior to the right patella in the area of the right patella tendon insertion. Neurologic:  Normal speech and language. No gross focal neurologic deficits are appreciated.  Skin:  Skin is warm, dry and intact. No rash noted. Psychiatric: Mood and affect are normal. Speech and behavior are normal.*    RADIOLOGY I, [place] Azelyn Batie, personally viewed and evaluated these images (plain radiographs) as part of my medical decision making, as well as reviewing the written report by the radiologist.  ED MD interpretation: Ribs knee CT head and cervical spine revealed no acute pathology per radiologist  Official radiology report(s): Dg Ribs Unilateral W/chest Right  Result Date: 04/10/2018 CLINICAL DATA:  62 year old female with fall and right chest wall pain. EXAM: RIGHT RIBS AND CHEST - 3+ VIEW COMPARISON:  Chest radiograph dated 08/04/2017 FINDINGS: The lungs are clear. There is no pleural effusion or pneumothorax. The cardiac silhouette is within normal limits. Mild atherosclerotic calcification of the aortic arch. A hiatal hernia is noted. No acute osseous pathology. No obvious rib fracture. IMPRESSION: Negative. Electronically Signed   By: Anner Crete M.D.   On: 04/10/2018 06:35   Ct Head Wo Contrast  Result Date: 04/10/2018 CLINICAL DATA:  62 year old female with C-spine trauma. EXAM: CT HEAD WITHOUT CONTRAST CT CERVICAL SPINE WITHOUT CONTRAST TECHNIQUE: Multidetector CT imaging of the head and cervical spine was performed following the standard protocol without intravenous contrast. Multiplanar CT image reconstructions of the cervical spine were also generated. COMPARISON:   Head CT dated 08/04/2017 FINDINGS: CT HEAD FINDINGS Brain: The ventricles and sulci appropriate size for patient's age. Minimal periventricular and deep white matter chronic microvascular ischemic changes. Stable area of encephalomalacia in the inferior right temporal lobe. There is no acute intracranial hemorrhage. No mass effect or midline shift. No extra-axial fluid collection. Vascular: No hyperdense vessel  or unexpected calcification. Skull: No acute calvarial pathology. Postsurgical changes of right temporal and sphenoid bones. Sinuses/Orbits: No acute finding. Other: None CT CERVICAL SPINE FINDINGS Alignment: No acute subluxation. Skull base and vertebrae: No acute fracture. No primary bone lesion or focal pathologic process. Soft tissues and spinal canal: No prevertebral fluid or swelling. No visible canal hematoma. Disc levels: Multilevel degenerative changes with endplate irregularity and disc space narrowing. Upper chest: Negative. Other: Left carotid bulb calcified plaques. IMPRESSION: 1. No acute intracranial pathology. 2. No acute/traumatic cervical spine pathology. Electronically Signed   By: Anner Crete M.D.   On: 04/10/2018 06:14   Ct Cervical Spine Wo Contrast  Result Date: 04/10/2018 CLINICAL DATA:  62 year old female with C-spine trauma. EXAM: CT HEAD WITHOUT CONTRAST CT CERVICAL SPINE WITHOUT CONTRAST TECHNIQUE: Multidetector CT imaging of the head and cervical spine was performed following the standard protocol without intravenous contrast. Multiplanar CT image reconstructions of the cervical spine were also generated. COMPARISON:  Head CT dated 08/04/2017 FINDINGS: CT HEAD FINDINGS Brain: The ventricles and sulci appropriate size for patient's age. Minimal periventricular and deep white matter chronic microvascular ischemic changes. Stable area of encephalomalacia in the inferior right temporal lobe. There is no acute intracranial hemorrhage. No mass effect or midline shift. No  extra-axial fluid collection. Vascular: No hyperdense vessel or unexpected calcification. Skull: No acute calvarial pathology. Postsurgical changes of right temporal and sphenoid bones. Sinuses/Orbits: No acute finding. Other: None CT CERVICAL SPINE FINDINGS Alignment: No acute subluxation. Skull base and vertebrae: No acute fracture. No primary bone lesion or focal pathologic process. Soft tissues and spinal canal: No prevertebral fluid or swelling. No visible canal hematoma. Disc levels: Multilevel degenerative changes with endplate irregularity and disc space narrowing. Upper chest: Negative. Other: Left carotid bulb calcified plaques. IMPRESSION: 1. No acute intracranial pathology. 2. No acute/traumatic cervical spine pathology. Electronically Signed   By: Anner Crete M.D.   On: 04/10/2018 06:14   Dg Knee Complete 4 Views Right  Result Date: 04/10/2018 CLINICAL DATA:  Initial evaluation for acute pain status post fall. EXAM: RIGHT KNEE - COMPLETE 4+ VIEW COMPARISON:  None. FINDINGS: No acute fracture or dislocation. Small joint effusion present within the suprapatellar recess. Moderate tricompartmental degenerative osteoarthrosis. Osseous mineralization normal. No acute soft tissue abnormality. IMPRESSION: 1. No acute osseous abnormality about the knee. 2. Small joint effusion. 3. Moderate tricompartmental degenerative osteoarthrosis. Electronically Signed   By: Jeannine Boga M.D.   On: 04/10/2018 06:32    ____________________________________________  Procedures   ____________________________________________   INITIAL IMPRESSION / ASSESSMENT AND PLAN / ED COURSE  As part of my medical decision making, I reviewed the following data within the electronic MEDICAL RECORD NUMBER   62 year old female presented with above-stated history and physical exam secondary to accidental fall.  Appropriate imaging performed secondary to clinical exam which revealed no acute findings.     ____________________________________________  FINAL CLINICAL IMPRESSION(S) / ED DIAGNOSES  Final diagnoses:  Contusion of thoracic wall, unspecified area of thoracic wall, initial encounter  Right knee contusion.   MEDICATIONS GIVEN DURING THIS VISIT:  Medications  oxyCODONE-acetaminophen (PERCOCET/ROXICET) 5-325 MG per tablet 1 tablet (1 tablet Oral Given 04/10/18 1517)     ED Discharge Orders    None       Note:  This document was prepared using Dragon voice recognition software and may include unintentional dictation errors.    Gregor Hams, MD 04/10/18 2230

## 2018-04-10 NOTE — ED Triage Notes (Signed)
Pt reports falling backward after getting up from the toilet , denies loc , c/o pain to right ribs ,right knee , and neck . Pt had fall on the 23rd as well .

## 2018-04-10 NOTE — ED Notes (Signed)
EMS here to pick pt up and transport back to facility - per night shift RN report has already been called to nursing facility - pt unable to sign for discharge due to dementia

## 2018-04-10 NOTE — ED Notes (Signed)
Pt assisted to toilet to void 

## 2018-04-28 ENCOUNTER — Encounter: Payer: Self-pay | Admitting: Family Medicine

## 2018-04-28 ENCOUNTER — Emergency Department: Payer: Medicaid Other

## 2018-04-28 ENCOUNTER — Emergency Department
Admission: EM | Admit: 2018-04-28 | Discharge: 2018-04-29 | Disposition: A | Discharge status: Nursing Home (Includes ICF) | Payer: Medicaid Other | Attending: Emergency Medicine | Admitting: Emergency Medicine

## 2018-04-28 ENCOUNTER — Other Ambulatory Visit: Payer: Self-pay

## 2018-04-28 DIAGNOSIS — S0083XA Contusion of other part of head, initial encounter: Secondary | ICD-10-CM | POA: Diagnosis not present

## 2018-04-28 DIAGNOSIS — Z79899 Other long term (current) drug therapy: Secondary | ICD-10-CM | POA: Diagnosis not present

## 2018-04-28 DIAGNOSIS — F419 Anxiety disorder, unspecified: Secondary | ICD-10-CM | POA: Diagnosis not present

## 2018-04-28 DIAGNOSIS — W01198A Fall on same level from slipping, tripping and stumbling with subsequent striking against other object, initial encounter: Secondary | ICD-10-CM | POA: Insufficient documentation

## 2018-04-28 DIAGNOSIS — F329 Major depressive disorder, single episode, unspecified: Secondary | ICD-10-CM | POA: Diagnosis not present

## 2018-04-28 DIAGNOSIS — Y929 Unspecified place or not applicable: Secondary | ICD-10-CM | POA: Insufficient documentation

## 2018-04-28 DIAGNOSIS — M25532 Pain in left wrist: Secondary | ICD-10-CM | POA: Diagnosis not present

## 2018-04-28 DIAGNOSIS — M79604 Pain in right leg: Secondary | ICD-10-CM | POA: Diagnosis not present

## 2018-04-28 DIAGNOSIS — J449 Chronic obstructive pulmonary disease, unspecified: Secondary | ICD-10-CM | POA: Diagnosis not present

## 2018-04-28 DIAGNOSIS — F17210 Nicotine dependence, cigarettes, uncomplicated: Secondary | ICD-10-CM | POA: Diagnosis not present

## 2018-04-28 DIAGNOSIS — Y998 Other external cause status: Secondary | ICD-10-CM | POA: Insufficient documentation

## 2018-04-28 DIAGNOSIS — S0990XA Unspecified injury of head, initial encounter: Secondary | ICD-10-CM | POA: Diagnosis present

## 2018-04-28 DIAGNOSIS — Y9389 Activity, other specified: Secondary | ICD-10-CM | POA: Insufficient documentation

## 2018-04-28 DIAGNOSIS — J45909 Unspecified asthma, uncomplicated: Secondary | ICD-10-CM | POA: Insufficient documentation

## 2018-04-28 DIAGNOSIS — R252 Cramp and spasm: Secondary | ICD-10-CM

## 2018-04-28 HISTORY — DX: Cerebral ischemia: I67.82

## 2018-04-28 NOTE — ED Provider Notes (Signed)
[facility] Emergency Department Provider Note  ____________________________________________   None    (approximate)  I have reviewed the triage vital signs and the nursing notes.   HISTORY  Chief Complaint Fall   HPI Marcia Brown is a 62 y.o. female who presents to the emergency department for treatment and evaluation after a mechanical fall.  She states that she was using her walker, leaned over to pick up some books that she had dropped and fell forward.  She landed on her outstretched left hand.  She struck her head on presumably the walker and now has a hematoma on the forehead.  She is on Plavix.  She complains of a headache that she did not have prior to the fall.  She also states that she has had right leg pain that started today prior to her fall.  She denies noticing any swelling.  She denies injury.  No alleviating measures were attempted prior to arrival.  Patient arrives via EMS.  She is a significant past medical history of seizures and CVA  Past Medical History:  Diagnosis Date  . Anxiety   . Asthma   . COPD (chronic obstructive pulmonary disease) ([place])   . Depression   . High cholesterol   . Polyneuropathy   . Seizures ([place])   . Sleep apnea     Patient Active Problem List   Diagnosis Date Noted  . Seizure ([place]) 02/03/2015    Past Surgical History:  Procedure Laterality Date  . ESOPHAGEAL DILATION    . FOOT SURGERY Right     Prior to Admission medications   Medication Sig Start Date End Date Taking? Authorizing Provider  acetaminophen (MAPAP) 500 MG tablet Take 500 mg by mouth every 6 (six) hours as needed for mild pain or moderate pain.    [provider]  acetaminophen (TYLENOL) 325 MG tablet Take 325 mg by mouth every 4 (four) hours as needed for mild pain. Take 2 tablets (650 mg) by mouth every 4 hours daily as needed    [provider]  albuterol (PROVENTIL HFA;VENTOLIN HFA) 108 (90 BASE) MCG/ACT inhaler  Inhale 2 puffs into the lungs every 4 (four) hours as needed for wheezing or shortness of breath.    [provider]  alum & mag hydroxide-simeth (ANTACID LIQUID) 200-200-20 MG/5ML suspension Take 30 mLs by mouth every 6 (six) hours as needed for indigestion or heartburn.    [provider]  atorvastatin (LIPITOR) 10 MG tablet Take 10 mg by mouth at bedtime.     [provider]  barrier cream (NON-SPECIFIED) CREA Apply 1 application topically as needed. Apply topically after toileting as needed to prevent skin breakdown    [provider]  cholecalciferol (VITAMIN D) 1000 units tablet Take 2,000 Units by mouth daily. Give with Vitamin D3 400 units    [provider]  cholecalciferol (VITAMIN D) 400 UNITS TABS tablet Take 400 Units by mouth daily. Give with Vitamin D3 2000 units    [provider]  clonazePAM (KLONOPIN) 0.5 MG tablet Take 0.5 mg by mouth 3 (three) times daily.     [provider]  cyanocobalamin 500 MCG tablet Take 500 mcg by mouth daily.    [provider]  diclofenac sodium (VOLTAREN) 1 % GEL Apply 4 g topically 3 (three) times daily. Apply to both knees    [provider]  diphenhydrAMINE (BENADRYL) 25 MG tablet Take 25 mg by mouth every 6 (six) hours as  needed for allergies.    [provider]  DULoxetine (CYMBALTA) 60 MG capsule Take 60 mg by mouth daily.    [provider]  ferrous sulfate 325 (65 FE) MG tablet Take 325 mg by mouth 3 (three) times daily with meals.    [provider]  gabapentin (NEURONTIN) 100 MG capsule Take 200 mg by mouth 2 (two) times daily.    [provider]  guaiFENesin (ROBITUSSIN) 100 MG/5ML SOLN Take 15 mLs by mouth every 4 (four) hours as needed for cough or to loosen phlegm.    [provider]  HYDROcodone-acetaminophen (NORCO) 5-325 MG tablet Take 1 tablet by mouth every 6 (six) hours as needed for moderate pain. 07/25/17    Paulette Blanch, MD  lacosamide 100 MG TABS Take 1 tablet (100 mg total) by mouth 2 (two) times daily. 02/04/15   Hillary Bow, MD  lamoTRIgine (LAMICTAL) 200 MG tablet Take 200-400 mg by mouth 2 (two) times daily. Pt takes 2 tablets (400 mg) in the morning on Monday, Wednesday and Friday and only 1 tablet (200 mg) bid on all other days in the morning.    [provider]  levETIRAcetam (KEPPRA) 1000 MG tablet Take 1 tablet (1,000 mg total) by mouth 2 (two) times daily. Patient taking differently: Take 1,500-2,000 mg by mouth daily. Take 2000 mg by mouth in the morning Tuesday, Thursday, Saturday and Sunday and 1500 mg by mouth every Monday, Wednesday and Friday morning. 11/26/15   Daymon Larsen, MD  loperamide (IMODIUM) 2 MG capsule Take 4 mg by mouth as needed for diarrhea or loose stools.    [provider]  magnesium hydroxide (MILK OF MAGNESIA) 400 MG/5ML suspension Take 30 mLs by mouth daily as needed for mild constipation or moderate constipation.    [provider]  meloxicam (MOBIC) 15 MG tablet Take 1 tablet (15 mg total) by mouth daily. 12/23/15   Cuthriell, Charline Bills, PA-C  mometasone-formoterol (DULERA) 100-5 MCG/ACT AERO Inhale 2 puffs into the lungs 2 (two) times daily.    [provider]  mupirocin ointment (BACTROBAN) 2 % Apply 1 application topically 2 (two) times daily. 10/30/16   Edrick Kins, DPM  Neomycin-Bacitracin-Polymyxin (TRIPLE ANTIBIOTIC) [phone] OINT Apply topically daily.    [provider]  NONFORMULARY OR COMPOUNDED ITEM Apply 1-2 g topically 4 (four) times daily. 04/03/16   Edrick Kins, DPM  ondansetron (ZOFRAN ODT) 4 MG disintegrating tablet Take 1 tablet (4 mg total) by mouth every 8 (eight) hours as needed for nausea or vomiting. 07/21/16   Earleen Newport, MD  pantoprazole (PROTONIX) 40 MG tablet Take 40 mg by mouth daily before breakfast.     [provider]  phenytoin (DILANTIN) 100 MG ER capsule  Take 100 mg by mouth 2 (two) times daily.     [provider]  Powders (GOLD BOND BABY POWDER EX) Apply topically daily as needed. For dry itchy skin    [provider]  pyrithione zinc (HEAD AND SHOULDERS) 1 % shampoo Apply topically daily as needed for itching.    [provider]  traMADol (ULTRAM) 50 MG tablet Take 50 mg by mouth 2 (two) times daily as needed for moderate pain.    [provider]    Allergies Ciprofloxacin and Aspirin  Family History  Problem Relation Age of Onset  . Breast cancer Daughter 79    Social History Social History   Tobacco Use  . Smoking status: Current Every  Day Smoker    Packs/day: 0.50    Types: Cigarettes  . Smokeless tobacco: Never Used  Substance Use Topics  . Alcohol use: No  . Drug use: No    Review of Systems  Constitutional: No fever/chills Eyes: No visual changes. ENT: No sore throat. Cardiovascular: Denies chest pain. Respiratory: Denies shortness of breath. Gastrointestinal: No abdominal pain.  No nausea, no vomiting.  No diarrhea.  No constipation. Genitourinary: Negative for dysuria. Musculoskeletal: Positive for left wrist pain and right lower extremity pain. Skin: Positive for hematoma to the forehead. Neurological: Positive for headache, negative for focal weakness or numbness. ____________________________________________   PHYSICAL EXAM:  VITAL SIGNS: ED Triage Vitals [04/28/18 2156]  Enc Vitals Group     BP 138/82     Pulse Rate (!) 54     Resp 16     Temp 98.3 F (36.8 C)     Temp Source Oral     SpO2 98 %     Weight 200 lb (90.7 kg)     Height 5\' 4"  (1.626 m)     Head Circumference      Peak Flow      Pain Score 9     Pain Loc      Pain Edu?      Excl. in [place]?     Constitutional: Alert and oriented. Well appearing and in no acute distress. Eyes: Conjunctivae are normal. Head: Atraumatic. Nose: No congestion/rhinnorhea. Mouth/Throat: Mucous membranes are moist.   Oropharynx non-erythematous. Neck: No stridor.   Cardiovascular: Normal rate, regular rhythm. Grossly normal heart sounds.  Good peripheral circulation. Respiratory: Normal respiratory effort.  No retractions. Lungs CTAB. Gastrointestinal: Soft and nontender. No distention. No abdominal bruits.  Musculoskeletal: Left wrist diffusely tender, but with FROM. No swelling appreciated. Right lower extremity appears equal in size to the left.  Neurologic:  Normal speech and language. No gross focal neurologic deficits are appreciated. No gait instability. Skin:  Skin is warm, dry and intact. No rash noted. Psychiatric: Mood and affect are normal. Speech and behavior are normal.  ____________________________________________   LABS (all labs ordered are listed, but only abnormal results are displayed)  Labs Reviewed  COMPREHENSIVE METABOLIC PANEL  CBC WITH DIFFERENTIAL/PLATELET  URINALYSIS, COMPLETE (UACMP) WITH MICROSCOPIC   ____________________________________________  EKG  Not indicated. ____________________________________________  RADIOLOGY  ED MD interpretation:    Image of the left wrist is negative for acute bony abnormality per radiology CT head without contrast negative for acute findings per radiology. Venous ultrasound right lower extremity negative for acute findings.  Official radiology report(s): No results found.  ____________________________________________   PROCEDURES  Procedure(s) performed: None  Procedures  Critical Care performed: No  ____________________________________________   INITIAL IMPRESSION / ASSESSMENT AND PLAN / ED COURSE  As part of my medical decision making, I reviewed the following data within the electronic MEDICAL RECORD NUMBER Notes from prior ED visits   62 year old female presenting to the emergency department after a mechanical, non-syncopal fall at home.  She has a history of frequent falls secondary to cerebral ischemia.   Today, she states that her right lower leg began to hurt but this is not related to the fall. Because of her significant comorbidities, ultrasound of the right lower extremity will be completed to rule out DVT.  Also, because she fell and struck her head while on Plavix a CT of her head will be ordered.  Patient made aware of these plans and agrees to treatment.  Patient discharged  home after labs and images are all reassuring.  She is to schedule an appointment with her primary care provider if her symptoms are not improving over the next few days.  She was encouraged to return to the emergency department for symptoms change or worsen or she is unable to schedule an appointment.     ____________________________________________   FINAL CLINICAL IMPRESSION(S) / ED DIAGNOSES  Final diagnoses:  None     ED Discharge Orders    None       Note:  This document was prepared using Dragon voice recognition software and may include unintentional dictation errors.    Victorino Dike, FNP 04/29/18 0226    Gregor Hams, MD 04/30/18 [phone]

## 2018-04-28 NOTE — ED Notes (Signed)
Patient transported to Ultrasound 

## 2018-04-28 NOTE — ED Triage Notes (Signed)
Pt brought in by [facility] due to fall while bending down to pick up books off the floor. She normally ambulates with walker and states she "lost her balance" which is normal per pt. She has head pain located in between eyes, small hematoma noted. No LOC with fall. States she remembers events leading up to the fall. She is A&Ox3. Pt is on blood thinners.

## 2018-04-29 LAB — COMPREHENSIVE METABOLIC PANEL
ALT: 22 U/L (ref 0–44)
AST: 25 U/L (ref 15–41)
Albumin: 3.8 g/dL (ref 3.5–5.0)
Alkaline Phosphatase: 58 U/L (ref 38–126)
Anion gap: 6 (ref 5–15)
BILIRUBIN TOTAL: 0.3 mg/dL (ref 0.3–1.2)
BUN: 16 mg/dL (ref 8–23)
CO2: 26 mmol/L (ref 22–32)
Calcium: 8.7 mg/dL — ABNORMAL LOW (ref 8.9–10.3)
Chloride: 107 mmol/L (ref 98–111)
Creatinine, Ser: 0.83 mg/dL (ref 0.44–1.00)
Glucose, Bld: 86 mg/dL (ref 70–99)
Potassium: 3.9 mmol/L (ref 3.5–5.1)
Sodium: 139 mmol/L (ref 135–145)
TOTAL PROTEIN: 6.1 g/dL — AB (ref 6.5–8.1)

## 2018-04-29 LAB — CBC WITH DIFFERENTIAL/PLATELET
ABS IMMATURE GRANULOCYTES: 0.01 10*3/uL (ref 0.00–0.07)
BASOS ABS: 0 10*3/uL (ref 0.0–0.1)
Basophils Relative: 0 %
EOS PCT: 3 %
Eosinophils Absolute: 0.1 10*3/uL (ref 0.0–0.5)
HCT: 37.1 % (ref 36.0–46.0)
HEMOGLOBIN: 12.8 g/dL (ref 12.0–15.0)
IMMATURE GRANULOCYTES: 0 %
LYMPHS ABS: 1.5 10*3/uL (ref 0.7–4.0)
LYMPHS PCT: 35 %
MCH: 35.2 pg — ABNORMAL HIGH (ref 26.0–34.0)
MCHC: 34.5 g/dL (ref 30.0–36.0)
MCV: 101.9 fL — ABNORMAL HIGH (ref 80.0–100.0)
Monocytes Absolute: 0.5 10*3/uL (ref 0.1–1.0)
Monocytes Relative: 12 %
NRBC: 0 % (ref 0.0–0.2)
Neutro Abs: 2.1 10*3/uL (ref 1.7–7.7)
Neutrophils Relative %: 50 %
Platelets: 167 10*3/uL (ref 150–400)
RBC: 3.64 MIL/uL — ABNORMAL LOW (ref 3.87–5.11)
RDW: 12.5 % (ref 11.5–15.5)
WBC: 4.2 10*3/uL (ref 4.0–10.5)

## 2018-04-29 LAB — URINALYSIS, COMPLETE (UACMP) WITH MICROSCOPIC
BILIRUBIN URINE: NEGATIVE
GLUCOSE, UA: NEGATIVE mg/dL
Ketones, ur: NEGATIVE mg/dL
LEUKOCYTES UA: NEGATIVE
NITRITE: NEGATIVE
Protein, ur: NEGATIVE mg/dL
Specific Gravity, Urine: 1.006 (ref 1.005–1.030)
WBC UA: NONE SEEN WBC/hpf (ref 0–5)
pH: 6 (ref 5.0–8.0)

## 2018-04-29 NOTE — Discharge Instructions (Signed)
Follow up with primary care for symptoms of concern. Return to the ER if your symptoms worsen and you are unable to schedule an appointment.

## 2018-05-30 ENCOUNTER — Emergency Department: Payer: Medicaid Other

## 2018-05-30 DIAGNOSIS — M7062 Trochanteric bursitis, left hip: Secondary | ICD-10-CM | POA: Diagnosis not present

## 2018-05-30 DIAGNOSIS — W01198A Fall on same level from slipping, tripping and stumbling with subsequent striking against other object, initial encounter: Secondary | ICD-10-CM | POA: Diagnosis not present

## 2018-05-30 DIAGNOSIS — Z79899 Other long term (current) drug therapy: Secondary | ICD-10-CM | POA: Diagnosis not present

## 2018-05-30 DIAGNOSIS — S7002XA Contusion of left hip, initial encounter: Secondary | ICD-10-CM | POA: Insufficient documentation

## 2018-05-30 DIAGNOSIS — F17210 Nicotine dependence, cigarettes, uncomplicated: Secondary | ICD-10-CM | POA: Insufficient documentation

## 2018-05-30 DIAGNOSIS — Y9389 Activity, other specified: Secondary | ICD-10-CM | POA: Diagnosis not present

## 2018-05-30 DIAGNOSIS — M25552 Pain in left hip: Secondary | ICD-10-CM | POA: Diagnosis present

## 2018-05-30 DIAGNOSIS — Y999 Unspecified external cause status: Secondary | ICD-10-CM | POA: Insufficient documentation

## 2018-05-30 DIAGNOSIS — J449 Chronic obstructive pulmonary disease, unspecified: Secondary | ICD-10-CM | POA: Insufficient documentation

## 2018-05-30 DIAGNOSIS — Y92128 Other place in nursing home as the place of occurrence of the external cause: Secondary | ICD-10-CM | POA: Insufficient documentation

## 2018-05-30 NOTE — ED Triage Notes (Addendum)
Patient c/o [facility] from the [facility] for mechanical fall. Patient c/o left hip pain post fall.   Per EMS patient able to stand and move/walk to stretcher

## 2018-05-30 NOTE — ED Triage Notes (Signed)
First Nurse: patient brought in by ems from [facility] [place]. Patient fell yesterday with complaint of left hip pain since fall. Patient able to stand and VSS per ems. Per ems patient uses a walker to ambulate but was bending over to pick something up and fell.

## 2018-05-31 ENCOUNTER — Emergency Department
Admission: EM | Admit: 2018-05-31 | Discharge: 2018-05-31 | Disposition: A | Discharge status: Home / Self Care | Payer: Medicaid Other | Attending: Emergency Medicine | Admitting: Emergency Medicine

## 2018-05-31 ENCOUNTER — Emergency Department: Payer: Medicaid Other

## 2018-05-31 DIAGNOSIS — T148XXA Other injury of unspecified body region, initial encounter: Secondary | ICD-10-CM

## 2018-05-31 DIAGNOSIS — M7062 Trochanteric bursitis, left hip: Secondary | ICD-10-CM

## 2018-05-31 MED ORDER — BUPIVACAINE HCL (PF) 0.5 % IJ SOLN
30.0000 mL | Freq: Once | INTRAMUSCULAR | Status: AC
  Administered 2018-05-31: 30 mL
  Filled 2018-05-31: qty 30

## 2018-05-31 MED ORDER — HYDROCODONE-ACETAMINOPHEN 5-325 MG PO TABS
1.0000 | ORAL_TABLET | Freq: Once | ORAL | Status: AC
  Administered 2018-05-31: 1 via ORAL
  Filled 2018-05-31: qty 1

## 2018-05-31 MED ORDER — HYDROCODONE-ACETAMINOPHEN 5-325 MG PO TABS: 1.0000 | ORAL_TABLET | Freq: Four times a day (QID) | ORAL | 0 refills | Status: DC | PRN

## 2018-05-31 NOTE — ED Provider Notes (Signed)
[facility] Emergency Department Provider Note  ____________________________________________   First MD Initiated Contact with Patient 05/31/18 0115     (approximate)  I have reviewed the triage vital signs and the nursing notes.   HISTORY  Chief Complaint Fall and Hip Pain   HPI Marcia Brown is a 62 y.o. female who comes to the emergency department via EMS after sustaining a mechanical fall yesterday.  She states she tripped on her walker landing on her left hip.   She was able to get up and ambulate thereafter.  She presents to the emergency department today with persistent throbbing aching discomfort in that left hip.  It is in the left lateral aspect.  It is nonradiating.  The pain is sharp and aching and severe.  She denies numbness or weakness.  She did not hit her head.  She denies chest pain shortness of breath abdominal pain nausea or vomiting.   Past Medical History:  Diagnosis Date  . Anxiety   . Asthma   . Cerebral ischemia   . COPD (chronic obstructive pulmonary disease) ([place])   . Depression   . High cholesterol   . Polyneuropathy   . Seizures ([place])   . Sleep apnea     Patient Active Problem List   Diagnosis Date Noted  . Seizure ([place]) 02/03/2015    Past Surgical History:  Procedure Laterality Date  . ESOPHAGEAL DILATION    . FOOT SURGERY Right     Prior to Admission medications   Medication Sig Start Date End Date Taking? Authorizing Provider  acetaminophen (MAPAP) 500 MG tablet Take 500 mg by mouth every 6 (six) hours as needed for mild pain or moderate pain.    [provider]  acetaminophen (TYLENOL) 325 MG tablet Take 325 mg by mouth every 4 (four) hours as needed for mild pain. Take 2 tablets (650 mg) by mouth every 4 hours daily as needed    [provider]  albuterol (PROVENTIL HFA;VENTOLIN HFA) 108 (90 BASE) MCG/ACT inhaler Inhale 2 puffs into the lungs every 4 (four) hours as needed for wheezing or  shortness of breath.    [provider]  alum & mag hydroxide-simeth (ANTACID LIQUID) 200-200-20 MG/5ML suspension Take 30 mLs by mouth every 6 (six) hours as needed for indigestion or heartburn.    [provider]  atorvastatin (LIPITOR) 10 MG tablet Take 10 mg by mouth at bedtime.     [provider]  barrier cream (NON-SPECIFIED) CREA Apply 1 application topically as needed. Apply topically after toileting as needed to prevent skin breakdown    [provider]  cholecalciferol (VITAMIN D) 1000 units tablet Take 2,000 Units by mouth daily. Give with Vitamin D3 400 units    [provider]  cholecalciferol (VITAMIN D) 400 UNITS TABS tablet Take 400 Units by mouth daily. Give with Vitamin D3 2000 units    [provider]  clonazePAM (KLONOPIN) 0.5 MG tablet Take 0.5 mg by mouth 3 (three) times daily.     [provider]  cyanocobalamin 500 MCG tablet Take 500 mcg by mouth daily.    [provider]  diclofenac sodium (VOLTAREN) 1 % GEL Apply 4 g topically 3 (three) times daily. Apply to both knees    [provider]  diphenhydrAMINE (BENADRYL) 25 MG tablet Take 25 mg by mouth every 6 (six) hours as needed for allergies.    [provider]  DULoxetine (CYMBALTA) 60 MG capsule Take 60  mg by mouth daily.    [provider]  ferrous sulfate 325 (65 FE) MG tablet Take 325 mg by mouth 3 (three) times daily with meals.    [provider]  gabapentin (NEURONTIN) 100 MG capsule Take 200 mg by mouth 2 (two) times daily.    [provider]  guaiFENesin (ROBITUSSIN) 100 MG/5ML SOLN Take 15 mLs by mouth every 4 (four) hours as needed for cough or to loosen phlegm.    [provider]  HYDROcodone-acetaminophen (NORCO) 5-325 MG tablet Take 1 tablet by mouth every 6 (six) hours as needed for up to 7 doses for severe pain. 05/31/18   Darel Hong, MD  lacosamide 100 MG TABS Take 1 tablet (100  mg total) by mouth 2 (two) times daily. 02/04/15   Hillary Bow, MD  lamoTRIgine (LAMICTAL) 200 MG tablet Take 200-400 mg by mouth 2 (two) times daily. Pt takes 2 tablets (400 mg) in the morning on Monday, Wednesday and Friday and only 1 tablet (200 mg) bid on all other days in the morning.    [provider]  levETIRAcetam (KEPPRA) 1000 MG tablet Take 1 tablet (1,000 mg total) by mouth 2 (two) times daily. Patient taking differently: Take 1,500-2,000 mg by mouth daily. Take 2000 mg by mouth in the morning Tuesday, Thursday, Saturday and Sunday and 1500 mg by mouth every Monday, Wednesday and Friday morning. 11/26/15   Daymon Larsen, MD  loperamide (IMODIUM) 2 MG capsule Take 4 mg by mouth as needed for diarrhea or loose stools.    [provider]  magnesium hydroxide (MILK OF MAGNESIA) 400 MG/5ML suspension Take 30 mLs by mouth daily as needed for mild constipation or moderate constipation.    [provider]  meloxicam (MOBIC) 15 MG tablet Take 1 tablet (15 mg total) by mouth daily. 12/23/15   Cuthriell, Charline Bills, PA-C  mometasone-formoterol (DULERA) 100-5 MCG/ACT AERO Inhale 2 puffs into the lungs 2 (two) times daily.    [provider]  mupirocin ointment (BACTROBAN) 2 % Apply 1 application topically 2 (two) times daily. 10/30/16   Edrick Kins, DPM  Neomycin-Bacitracin-Polymyxin (TRIPLE ANTIBIOTIC) [phone] OINT Apply topically daily.    [provider]  NONFORMULARY OR COMPOUNDED ITEM Apply 1-2 g topically 4 (four) times daily. 04/03/16   Edrick Kins, DPM  ondansetron (ZOFRAN ODT) 4 MG disintegrating tablet Take 1 tablet (4 mg total) by mouth every 8 (eight) hours as needed for nausea or vomiting. 07/21/16   Earleen Newport, MD  pantoprazole (PROTONIX) 40 MG tablet Take 40 mg by mouth daily before breakfast.     [provider]  phenytoin (DILANTIN) 100 MG ER capsule Take 100 mg by mouth 2 (two) times daily.     [provider]  Powders (GOLD BOND BABY POWDER EX) Apply topically daily as needed. For dry itchy skin    [provider]  pyrithione zinc (HEAD AND SHOULDERS) 1 % shampoo Apply topically daily as needed for itching.    [provider]    Allergies Ciprofloxacin and Aspirin  Family History  Problem Relation Age of Onset  . Breast cancer Daughter 108    Social History Social History   Tobacco Use  . Smoking status: Current Every Day Smoker    Packs/day: 0.50    Types: Cigarettes  . Smokeless tobacco: Never Used  Substance Use Topics  . Alcohol use: No  . Drug use: No    Review of Systems Constitutional:  No fever/chills Eyes: No visual changes. ENT: No sore throat. Cardiovascular: Denies chest pain. Respiratory: Denies shortness of breath. Gastrointestinal: No abdominal pain.  No nausea, no vomiting.  No diarrhea.  No constipation. Genitourinary: Negative for dysuria. Musculoskeletal: Positive for hip pain Skin: Negative for rash. Neurological: Negative for headaches, focal weakness or numbness.   ____________________________________________   PHYSICAL EXAM:  VITAL SIGNS: ED Triage Vitals  Enc Vitals Group     BP 05/30/18 2212 128/66     Pulse Rate 05/30/18 2212 (!) 55     Resp --      Temp 05/30/18 2212 98.1 F (36.7 C)     Temp Source 05/30/18 2212 Oral     SpO2 05/30/18 2212 97 %     Weight 05/30/18 2215 200 lb 9.9 oz (91 kg)     Height 05/30/18 2215 5\' 4"  (1.626 m)     Head Circumference --      Peak Flow --      Pain Score 05/30/18 2215 9     Pain Loc --      Pain Edu? --      Excl. in [place]? --     Constitutional: Alert and oriented x4 nontoxic no diaphoresis speaks in full clear sentences.  Does appear quite uncomfortable Eyes: PERRL EOMI. Head: Atraumatic. Nose: No congestion/rhinnorhea. Mouth/Throat: No trismus Neck: No stridor.   Cardiovascular: Normal rate, regular rhythm. Grossly normal heart sounds.  Good peripheral  circulation. Respiratory: Normal respiratory effort.  No retractions. Lungs CTAB and moving good air Gastrointestinal: Obese soft nontender Musculoskeletal: Quizzically tender over left lateral hip along her trochanteric bursa.  Legs are equal in length and not rotated.  Minimal discomfort when ranging the hip Neurologic:  Normal speech and language. No gross focal neurologic deficits are appreciated. Skin:  Skin is warm, dry and intact. No rash noted. Psychiatric: Mood and affect are normal. Speech and behavior are normal.    ____________________________________________   DIFFERENTIAL includes but not limited to  Trochanteric bursitis, hip fracture, hematoma, muscle strain ____________________________________________   LABS (all labs ordered are listed, but only abnormal results are displayed)  Labs Reviewed - No data to display   __________________________________________  EKG   ____________________________________________  RADIOLOGY  X-ray of the left hip reviewed by me with no acute traumatic injury noted CT scan of the pelvis reviewed by me with no acute traumatic fracture ____________________________________________   PROCEDURES  Procedure(s) performed: Yes  .Nerve Block Date/Time: 05/31/2018 3:00 AM Performed by: Darel Hong, MD Authorized by: Darel Hong, MD   Consent:    Consent obtained:  Verbal   Consent given by:  Patient   Risks discussed:  Allergic reaction, bleeding, pain and unsuccessful block   Alternatives discussed:  Alternative treatment Indications:    Indications:  Pain relief Location:    Body area:  Lower extremity   Lower extremity nerve blocked: trochanteric bursa.   Laterality:  Left Pre-procedure details:    Skin preparation:  Alcohol Skin anesthesia (see MAR for exact dosages):    Skin anesthesia method:  None Procedure details (see MAR for exact dosages):    Block needle gauge:  22 G   Anesthetic injected:   Bupivacaine 0.5% w/o epi   Steroid injected:  None   Additive injected:  None   Injection procedure:  Anatomic landmarks identified, anatomic landmarks palpated, incremental injection and negative aspiration for blood   Paresthesia:  Immediately resolved Post-procedure details:    Dressing:  None   Outcome:  Pain improved   Patient tolerance of procedure:  Tolerated well, no immediate complications Comments:     I injected a total of 5 cc of 0.5% bupivacaine into the patient's left trochanteric bursa without complication with improvement in her pain    Critical Care performed: No  ____________________________________________   INITIAL IMPRESSION / ASSESSMENT AND PLAN / ED COURSE  Pertinent labs & imaging results that were available during my care of the patient were reviewed by me and considered in my medical decision making (see chart for details).   As part of my medical decision making, I reviewed the following data within the [facility] History obtained from family if available, nursing notes, old chart and ekg, as well as notes from prior ED visits.  Patient arrives 24 hours after mechanical fall.  She has pain over her left trochanteric bursa.  X-ray performed and is negative however given the severity of her pain I obtained a CT scan to evaluate for occult fracture which fortunately is negative as well.  I gave the patient a tablet of hydrocodone and offered her an injection into her trochanteric bursa which she accepted and her pain improved.  We discussed the predicted clinical course and she is discharged home in improved condition.  Strict return precautions been given.      ____________________________________________   FINAL CLINICAL IMPRESSION(S) / ED DIAGNOSES  Final diagnoses:  Trochanteric bursitis of left hip  Hematoma      NEW MEDICATIONS STARTED DURING THIS VISIT:  Discharge Medication List as of 05/31/2018  3:38 AM       Note:   This document was prepared using Dragon voice recognition software and may include unintentional dictation errors.    Darel Hong, MD 06/03/18 [phone]

## 2018-05-31 NOTE — ED Notes (Signed)
Pt sleeping in waiting room. Pt is in NAD.

## 2018-05-31 NOTE — Discharge Instructions (Signed)
It was a pleasure to take care of you today, and thank you for coming to our emergency department.  If you have any questions or concerns before leaving please ask the nurse to grab me and I'm more than happy to go through your aftercare instructions again.  If you were prescribed any opioid pain medication today such as Norco, Vicodin, Percocet, morphine, hydrocodone, or oxycodone please make sure you do not drive when you are taking this medication as it can alter your ability to drive safely.  If you have any concerns once you are home that you are not improving or are in fact getting worse before you can make it to your follow-up appointment, please do not hesitate to call 911 and come back for further evaluation.  Darel Hong, MD  Results for orders placed or performed during the hospital encounter of 04/28/18  Comprehensive metabolic panel  Result Value Ref Range   Sodium 139 135 - 145 mmol/L   Potassium 3.9 3.5 - 5.1 mmol/L   Chloride 107 98 - 111 mmol/L   CO2 26 22 - 32 mmol/L   Glucose, Bld 86 70 - 99 mg/dL   BUN 16 8 - 23 mg/dL   Creatinine, Ser 0.83 0.44 - 1.00 mg/dL   Calcium 8.7 (L) 8.9 - 10.3 mg/dL   Total Protein 6.1 (L) 6.5 - 8.1 g/dL   Albumin 3.8 3.5 - 5.0 g/dL   AST 25 15 - 41 U/L   ALT 22 0 - 44 U/L   Alkaline Phosphatase 58 38 - 126 U/L   Total Bilirubin 0.3 0.3 - 1.2 mg/dL   GFR calc non Af Amer >60 >60 mL/min   GFR calc Af Amer >60 >60 mL/min   Anion gap 6 5 - 15  CBC with Differential  Result Value Ref Range   WBC 4.2 4.0 - 10.5 K/uL   RBC 3.64 (L) 3.87 - 5.11 MIL/uL   Hemoglobin 12.8 12.0 - 15.0 g/dL   HCT 37.1 36.0 - 46.0 %   MCV 101.9 (H) 80.0 - 100.0 fL   MCH 35.2 (H) 26.0 - 34.0 pg   MCHC 34.5 30.0 - 36.0 g/dL   RDW 12.5 11.5 - 15.5 %   Platelets 167 150 - 400 K/uL   nRBC 0.0 0.0 - 0.2 %   Neutrophils Relative % 50 %   Neutro Abs 2.1 1.7 - 7.7 K/uL   Lymphocytes Relative 35 %   Lymphs Abs 1.5 0.7 - 4.0 K/uL   Monocytes Relative 12 %   Monocytes Absolute 0.5 0.1 - 1.0 K/uL   Eosinophils Relative 3 %   Eosinophils Absolute 0.1 0.0 - 0.5 K/uL   Basophils Relative 0 %   Basophils Absolute 0.0 0.0 - 0.1 K/uL   Immature Granulocytes 0 %   Abs Immature Granulocytes 0.01 0.00 - 0.07 K/uL  Urinalysis, Complete w Microscopic  Result Value Ref Range   Color, Urine STRAW (A) YELLOW   APPearance CLEAR (A) CLEAR   Specific Gravity, Urine 1.006 1.005 - 1.030   pH 6.0 5.0 - 8.0   Glucose, UA NEGATIVE NEGATIVE mg/dL   Hgb urine dipstick MODERATE (A) NEGATIVE   Bilirubin Urine NEGATIVE NEGATIVE   Ketones, ur NEGATIVE NEGATIVE mg/dL   Protein, ur NEGATIVE NEGATIVE mg/dL   Nitrite NEGATIVE NEGATIVE   Leukocytes, UA NEGATIVE NEGATIVE   RBC / HPF 6-10 0 - 5 RBC/hpf   WBC, UA NONE SEEN 0 - 5 WBC/hpf   Bacteria, UA RARE (A) NONE  SEEN   Squamous Epithelial / LPF 0-5 0 - 5   Mucus PRESENT    Ct Pelvis Wo Contrast  Result Date: 05/31/2018 CLINICAL DATA:  Status post fall, with left hip pain. Initial encounter. EXAM: CT PELVIS WITHOUT CONTRAST TECHNIQUE: Multidetector CT imaging of the pelvis was performed following the standard protocol without intravenous contrast. COMPARISON:  CT of the abdomen and pelvis performed 08/21/2017 FINDINGS: Urinary Tract: The bladder is mildly distended and grossly unremarkable. Bowel: Visualized small and large bowel loops are grossly unremarkable. Vascular/Lymphatic: Scattered calcification is noted along the common and internal iliac arteries bilaterally. No retroperitoneal or pelvic sidewall lymphadenopathy is seen. Reproductive: The uterus is unremarkable in appearance. The ovaries are relatively symmetric. No suspicious adnexal masses are seen. Other:  Soft tissue swelling is noted overlying the right hip. Musculoskeletal: No acute osseous abnormalities are identified. Vacuum phenomenon is noted at L5-S1. The visualized musculature is unremarkable in appearance. IMPRESSION: 1. No evidence of fracture or  dislocation. 2. Soft tissue swelling overlying the right hip. Electronically Signed   By: Garald Balding M.D.   On: 05/31/2018 01:47   Dg Hip Unilat With Pelvis 2-3 Views Left  Result Date: 05/30/2018 CLINICAL DATA:  Fall with hip pain EXAM: DG HIP (WITH OR WITHOUT PELVIS) 2-3V LEFT COMPARISON:  04/27/2017 FINDINGS: There is no evidence of hip fracture or dislocation. There is no evidence of arthropathy or other focal bone abnormality. IMPRESSION: Negative. Electronically Signed   By: Donavan Foil M.D.   On: 05/30/2018 22:57

## 2018-06-21 ENCOUNTER — Other Ambulatory Visit: Payer: Self-pay

## 2018-06-21 ENCOUNTER — Emergency Department
Admission: EM | Admit: 2018-06-21 | Discharge: 2018-06-22 | Disposition: A | Discharge status: Home / Self Care | Payer: Medicaid Other | Attending: Emergency Medicine | Admitting: Emergency Medicine

## 2018-06-21 ENCOUNTER — Emergency Department: Payer: Medicaid Other

## 2018-06-21 DIAGNOSIS — J449 Chronic obstructive pulmonary disease, unspecified: Secondary | ICD-10-CM | POA: Insufficient documentation

## 2018-06-21 DIAGNOSIS — R569 Unspecified convulsions: Secondary | ICD-10-CM | POA: Insufficient documentation

## 2018-06-21 DIAGNOSIS — N390 Urinary tract infection, site not specified: Secondary | ICD-10-CM | POA: Diagnosis not present

## 2018-06-21 DIAGNOSIS — F17210 Nicotine dependence, cigarettes, uncomplicated: Secondary | ICD-10-CM | POA: Diagnosis not present

## 2018-06-21 LAB — COMPREHENSIVE METABOLIC PANEL
ALT: 18 U/L (ref 0–44)
ANION GAP: 8 (ref 5–15)
AST: 25 U/L (ref 15–41)
Albumin: 3.7 g/dL (ref 3.5–5.0)
Alkaline Phosphatase: 77 U/L (ref 38–126)
BUN: 14 mg/dL (ref 8–23)
CO2: 26 mmol/L (ref 22–32)
Calcium: 8.6 mg/dL — ABNORMAL LOW (ref 8.9–10.3)
Chloride: 105 mmol/L (ref 98–111)
Creatinine, Ser: 0.75 mg/dL (ref 0.44–1.00)
GFR calc Af Amer: >60 mL/min (ref >60)
GFR calc non Af Amer: >60 mL/min (ref >60)
Glucose, Bld: 97 mg/dL (ref 70–99)
POTASSIUM: 4 mmol/L (ref 3.5–5.1)
Sodium: 139 mmol/L (ref 135–145)
Total Bilirubin: 0.6 mg/dL (ref 0.3–1.2)
Total Protein: 6.2 g/dL — ABNORMAL LOW (ref 6.5–8.1)

## 2018-06-21 LAB — CBC WITH DIFFERENTIAL/PLATELET
Abs Immature Granulocytes: 0.01 10*3/uL (ref 0.00–0.07)
Basophils Absolute: 0 10*3/uL (ref 0.0–0.1)
Basophils Relative: 0 %
Eosinophils Absolute: 0.1 10*3/uL (ref 0.0–0.5)
Eosinophils Relative: 3 %
HEMATOCRIT: 36.9 % (ref 36.0–46.0)
Hemoglobin: 12.7 g/dL (ref 12.0–15.0)
Immature Granulocytes: 0 %
Lymphocytes Relative: 27 %
Lymphs Abs: 1.2 10*3/uL (ref 0.7–4.0)
MCH: 35.2 pg — ABNORMAL HIGH (ref 26.0–34.0)
MCHC: 34.4 g/dL (ref 30.0–36.0)
MCV: 102.2 fL — ABNORMAL HIGH (ref 80.0–100.0)
Monocytes Absolute: 0.5 10*3/uL (ref 0.1–1.0)
Monocytes Relative: 12 %
Neutro Abs: 2.6 10*3/uL (ref 1.7–7.7)
Neutrophils Relative %: 58 %
Platelets: 185 10*3/uL (ref 150–400)
RBC: 3.61 MIL/uL — ABNORMAL LOW (ref 3.87–5.11)
RDW: 13.2 % (ref 11.5–15.5)
WBC: 4.5 10*3/uL (ref 4.0–10.5)
nRBC: 0 % (ref 0.0–0.2)

## 2018-06-21 LAB — PHENYTOIN LEVEL, TOTAL: Phenytoin Lvl: 12.4 ug/mL (ref 10.0–20.0)

## 2018-06-21 LAB — TROPONIN I: Troponin I: <0.03 ng/mL (ref <0.03)

## 2018-06-21 NOTE — ED Provider Notes (Signed)
[facility] Emergency Department Provider Note       Time seen: ----------------------------------------- 10:09 PM on 06/21/2018 -----------------------------------------   I have reviewed the triage vital signs and the nursing notes.  HISTORY   Chief Complaint No chief complaint on file.    HPI Marcia Brown is a 63 y.o. female with a history of anxiety, cerebral ischemia, COPD, hyperlipidemia, seizures who presents to the ED for seizure-like activity at her place of living.  Patient is reportedly taking her medications as prescribed.  She does not remember the event, has no complaints at this time.  She was not reportedly postictal, did not bite her tongue.  Past Medical History:  Diagnosis Date  . Anxiety   . Asthma   . Cerebral ischemia   . COPD (chronic obstructive pulmonary disease) ([place])   . Depression   . High cholesterol   . Polyneuropathy   . Seizures ([place])   . Sleep apnea     Patient Active Problem List   Diagnosis Date Noted  . Seizure ([place]) 02/03/2015    Past Surgical History:  Procedure Laterality Date  . ESOPHAGEAL DILATION    . FOOT SURGERY Right     Allergies Ciprofloxacin and Aspirin  Social History Social History   Tobacco Use  . Smoking status: Current Every Day Smoker    Packs/day: 0.50    Types: Cigarettes  . Smokeless tobacco: Never Used  Substance Use Topics  . Alcohol use: No  . Drug use: No   Review of Systems Constitutional: Negative for fever. Cardiovascular: Negative for chest pain. Respiratory: Negative for shortness of breath. Gastrointestinal: Negative for abdominal pain, vomiting and diarrhea. Musculoskeletal: Negative for back pain. Skin: Negative for rash. Neurological: Negative for headaches, focal weakness or numbness.  Positive for seizure  All systems negative/normal/unremarkable except as stated in the HPI  ____________________________________________   PHYSICAL EXAM:  VITAL  SIGNS: ED Triage Vitals  Enc Vitals Group     BP      Pulse      Resp      Temp      Temp src      SpO2      Weight      Height      Head Circumference      Peak Flow      Pain Score      Pain Loc      Pain Edu?      Excl. in [place]?    Constitutional: Alert and oriented. Well appearing and in no distress. Eyes: Conjunctivae are normal. Normal extraocular movements. ENT      Head: Normocephalic and atraumatic.      Nose: No congestion/rhinnorhea.      Mouth/Throat: Mucous membranes are moist.      Neck: No stridor. Cardiovascular: Normal rate, regular rhythm. No murmurs, rubs, or gallops. Respiratory: Normal respiratory effort without tachypnea nor retractions. Breath sounds are clear and equal bilaterally. No wheezes/rales/rhonchi. Gastrointestinal: Soft and nontender. Normal bowel sounds Musculoskeletal: Nontender with normal range of motion in extremities. No lower extremity tenderness nor edema. Neurologic:  Normal speech and language. No gross focal neurologic deficits are appreciated.  Skin:  Skin is warm, dry and intact. No rash noted. Psychiatric: Mood and affect are normal. Speech and behavior are normal.  ___________________________________________  ED COURSE:  As part of my medical decision making, I reviewed the following data within the [facility] History obtained from family if available, nursing notes, old  chart and ekg, as well as notes from prior ED visits. Patient presented for likely seizure, we will assess with labs and imaging as indicated at this time.   Procedures ____________________________________________   LABS (pertinent positives/negatives)  Labs Reviewed  CBC WITH DIFFERENTIAL/PLATELET  COMPREHENSIVE METABOLIC PANEL  TROPONIN I  URINALYSIS, COMPLETE (UACMP) WITH MICROSCOPIC  PHENYTOIN LEVEL, TOTAL  LEVETIRACETAM LEVEL  CBG MONITORING, ED   ____________________________________________   DIFFERENTIAL DIAGNOSIS    Seizure, medication noncompliance, dehydration, electrolyte abnormality, occult infection  FINAL ASSESSMENT AND PLAN  Seizure   Plan: The patient had presented for possible seizure. Patient's labs are still pending.  I do not anticipate admission at this time.  She will require period of observation and medical clearance prior to discharge.   Laurence Aly, MD    Note: This note was generated in part or whole with voice recognition software. Voice recognition is usually quite accurate but there are transcription errors that can and very often do occur. I apologize for any typographical errors that were not detected and corrected.     Earleen Newport, MD 06/21/18 2255

## 2018-06-21 NOTE — ED Triage Notes (Signed)
Pt BIB EMS from the [facility] at [place] for a seizure. Pt has hx of the same, has been compliant with all home anticonvulsant meds. No apparent urinary incontinence or oral injuries noted. Pt is alert and oriented in triage. VSS.

## 2018-06-21 NOTE — Discharge Instructions (Addendum)
The lab work-up is normal.  Continue taking your normal seizure medications.  An antibiotic has been prescribed for the urinary tract infection.  This should be given as prescribed and you should finish the full course.  Return to the ER for new, worsening, recurrent seizure-like activity, fever, vomiting, weakness, or any other new or worsening symptoms.

## 2018-06-22 LAB — URINALYSIS, COMPLETE (UACMP) WITH MICROSCOPIC
Bilirubin Urine: NEGATIVE
Glucose, UA: NEGATIVE mg/dL
Ketones, ur: NEGATIVE mg/dL
NITRITE: POSITIVE — AB
PH: 7 (ref 5.0–8.0)
Protein, ur: NEGATIVE mg/dL
Specific Gravity, Urine: 1.013 (ref 1.005–1.030)
WBC, UA: >50 WBC/hpf — ABNORMAL HIGH (ref 0–5)

## 2018-06-22 MED ORDER — CEPHALEXIN 500 MG PO CAPS: 500.0000 mg | ORAL_CAPSULE | Freq: Two times a day (BID) | ORAL | 0 refills | Status: AC

## 2018-06-22 MED ORDER — CEPHALEXIN 500 MG PO CAPS
500.0000 mg | ORAL_CAPSULE | Freq: Once | ORAL | Status: AC
  Administered 2018-06-22: 500 mg via ORAL
  Filled 2018-06-22: qty 1

## 2018-06-22 MED ORDER — GUAIFENESIN 100 MG/5ML PO SOLN
15.0000 mL | Freq: Once | ORAL | Status: DC
  Filled 2018-06-22: qty 15

## 2018-06-22 NOTE — ED Notes (Signed)
Pt verbalized understanding of d/c instructions, rx, and f/u care. No further questions. Report has been called to Mrs. Owens Shark at [facility]. Pt off the floor at this time with [facility].

## 2018-06-22 NOTE — ED Provider Notes (Signed)
-----------------------------------------   1:10 AM on 06/22/2018 -----------------------------------------  I took over care on this patient from Dr. Jimmye Norman.  At the time of signout, the plan was to follow-up on the patient's lab work-up and observe her.  If the labs were unremarkable and the patient remained without recurrent seizure, she would be discharged back to her facility.  On reassessment, the patient is alert and at baseline mental status.  She has had no further seizures.  Her vital signs are stable.  The lab work-up is unremarkable except that the patient's UA is consistent with a UTI.  Her phenytoin level is normal and the levetiracetam level likely not result during the visit.  I discussed the UA result with the patient.  The patient states that she was diagnosed with a UTI a short time ago and was told that she was being treated for 1.  However I reviewed the [facility] that came with her paperwork and I do not see any antibiotic listed.  Therefore, I will start the patient on Keflex.  I will give a dose here and prescribed it for her to take at the facility.  I informed her of this and she agreed.  The patient is stable for discharge at this time.  Return precautions given, and she expresses understanding.   Arta Silence, MD 06/22/18 [phone]

## 2018-06-25 LAB — LEVETIRACETAM LEVEL: Levetiracetam Lvl: 48.8 ug/mL — ABNORMAL HIGH (ref 10.0–40.0)

## 2018-07-08 ENCOUNTER — Other Ambulatory Visit: Payer: Self-pay

## 2018-07-08 ENCOUNTER — Emergency Department: Payer: Medicaid Other

## 2018-07-08 ENCOUNTER — Emergency Department
Admission: EM | Admit: 2018-07-08 | Discharge: 2018-07-08 | Disposition: A | Discharge status: Home / Self Care | Payer: Medicaid Other | Attending: Emergency Medicine | Admitting: Emergency Medicine

## 2018-07-08 ENCOUNTER — Encounter: Payer: Self-pay | Admitting: *Deleted

## 2018-07-08 DIAGNOSIS — Z79899 Other long term (current) drug therapy: Secondary | ICD-10-CM | POA: Diagnosis not present

## 2018-07-08 DIAGNOSIS — F419 Anxiety disorder, unspecified: Secondary | ICD-10-CM | POA: Insufficient documentation

## 2018-07-08 DIAGNOSIS — J45909 Unspecified asthma, uncomplicated: Secondary | ICD-10-CM | POA: Insufficient documentation

## 2018-07-08 DIAGNOSIS — F17210 Nicotine dependence, cigarettes, uncomplicated: Secondary | ICD-10-CM | POA: Insufficient documentation

## 2018-07-08 DIAGNOSIS — F329 Major depressive disorder, single episode, unspecified: Secondary | ICD-10-CM | POA: Diagnosis not present

## 2018-07-08 DIAGNOSIS — J449 Chronic obstructive pulmonary disease, unspecified: Secondary | ICD-10-CM | POA: Diagnosis not present

## 2018-07-08 DIAGNOSIS — R569 Unspecified convulsions: Secondary | ICD-10-CM

## 2018-07-08 LAB — CBC WITH DIFFERENTIAL/PLATELET
Abs Immature Granulocytes: 0.02 10*3/uL (ref 0.00–0.07)
Basophils Absolute: 0 10*3/uL (ref 0.0–0.1)
Basophils Relative: 0 %
Eosinophils Absolute: 0.1 10*3/uL (ref 0.0–0.5)
Eosinophils Relative: 2 %
HCT: 39.3 % (ref 36.0–46.0)
Hemoglobin: 13.2 g/dL (ref 12.0–15.0)
Immature Granulocytes: 0 %
Lymphocytes Relative: 26 %
Lymphs Abs: 1.2 10*3/uL (ref 0.7–4.0)
MCH: 35 pg — ABNORMAL HIGH (ref 26.0–34.0)
MCHC: 33.6 g/dL (ref 30.0–36.0)
MCV: 104.2 fL — ABNORMAL HIGH (ref 80.0–100.0)
MONO ABS: 0.5 10*3/uL (ref 0.1–1.0)
Monocytes Relative: 11 %
Neutro Abs: 2.9 10*3/uL (ref 1.7–7.7)
Neutrophils Relative %: 61 %
Platelets: 182 10*3/uL (ref 150–400)
RBC: 3.77 MIL/uL — ABNORMAL LOW (ref 3.87–5.11)
RDW: 13.4 % (ref 11.5–15.5)
WBC: 4.8 10*3/uL (ref 4.0–10.5)
nRBC: 0 % (ref 0.0–0.2)

## 2018-07-08 LAB — COMPREHENSIVE METABOLIC PANEL
ALT: 22 U/L (ref 0–44)
AST: 35 U/L (ref 15–41)
Albumin: 3.9 g/dL (ref 3.5–5.0)
Alkaline Phosphatase: 62 U/L (ref 38–126)
Anion gap: 7 (ref 5–15)
BUN: 17 mg/dL (ref 8–23)
CO2: 25 mmol/L (ref 22–32)
Calcium: 8.6 mg/dL — ABNORMAL LOW (ref 8.9–10.3)
Chloride: 103 mmol/L (ref 98–111)
Creatinine, Ser: 0.83 mg/dL (ref 0.44–1.00)
GFR calc Af Amer: >60 mL/min (ref >60)
GFR calc non Af Amer: >60 mL/min (ref >60)
Glucose, Bld: 104 mg/dL — ABNORMAL HIGH (ref 70–99)
POTASSIUM: 3.4 mmol/L — AB (ref 3.5–5.1)
Sodium: 135 mmol/L (ref 135–145)
Total Bilirubin: 0.6 mg/dL (ref 0.3–1.2)
Total Protein: 6.8 g/dL (ref 6.5–8.1)

## 2018-07-08 LAB — URINALYSIS, COMPLETE (UACMP) WITH MICROSCOPIC
Bacteria, UA: NONE SEEN
Bilirubin Urine: NEGATIVE
GLUCOSE, UA: NEGATIVE mg/dL
KETONES UR: NEGATIVE mg/dL
Leukocytes, UA: NEGATIVE
Nitrite: NEGATIVE
Protein, ur: NEGATIVE mg/dL
Specific Gravity, Urine: 1.008 (ref 1.005–1.030)
pH: 6 (ref 5.0–8.0)

## 2018-07-08 LAB — PHENYTOIN LEVEL, TOTAL: Phenytoin Lvl: 10.4 ug/mL (ref 10.0–20.0)

## 2018-07-08 MED ORDER — SODIUM CHLORIDE 0.9 % IV SOLN
50.0000 mg | Freq: Once | INTRAVENOUS | Status: AC
  Administered 2018-07-08: 50 mg via INTRAVENOUS
  Filled 2018-07-08: qty 5

## 2018-07-08 MED ORDER — LACOSAMIDE 50 MG PO TABS: 50.0000 mg | ORAL_TABLET | Freq: Two times a day (BID) | ORAL | 0 refills | Status: DC

## 2018-07-08 NOTE — ED Notes (Signed)
Resumed care from sonjia rn.  Pt alert. Up to br with assistance.

## 2018-07-08 NOTE — ED Notes (Signed)
Seizure pads in place

## 2018-07-08 NOTE — ED Notes (Signed)
Pt continues to wait on ems for transport   Pt alert.

## 2018-07-08 NOTE — ED Notes (Signed)
Report given to Amy C. RN

## 2018-07-08 NOTE — ED Notes (Signed)
Pt in hallway bed.  Pt continues to wait on ems.

## 2018-07-08 NOTE — ED Provider Notes (Signed)
[facility] Emergency Department Provider Note  ____________________________________________   First MD Initiated Contact with Patient 07/08/18 1258     (approximate)  I have reviewed the triage vital signs and the nursing notes.   HISTORY  Chief Complaint Seizures   HPI Marcia Brown is a 63 y.o. female with history of COPD, seizure disorder as well as CVA was presented emergency department today with seizure-like activity.  Seizure-like activity was noted by the patient's roommate.  Patient says that she did feel like she had a seizure because she felt a degree of confusion both before and after the incident.  Denies any weakness or numbness at this time.  Denying any pain.  EMS reported the patient was mildly confused in route.  Patient reports compliance with medications.  Also seen in the emergency department approximate 1 week ago for seizure-like activity and diagnosed with UTI at that time.   Past Medical History:  Diagnosis Date  . Anxiety   . Asthma   . Cerebral ischemia   . COPD (chronic obstructive pulmonary disease) ([place])   . Depression   . High cholesterol   . Polyneuropathy   . Seizures ([place])   . Sleep apnea     Patient Active Problem List   Diagnosis Date Noted  . Seizure ([place]) 02/03/2015    Past Surgical History:  Procedure Laterality Date  . ESOPHAGEAL DILATION    . FOOT SURGERY Right     Prior to Admission medications   Medication Sig Start Date End Date Taking? Authorizing Provider  acetaminophen (MAPAP) 500 MG tablet Take 500 mg by mouth every 6 (six) hours as needed for mild pain or moderate pain.    [provider]  acetaminophen (TYLENOL) 325 MG tablet Take 325 mg by mouth every 4 (four) hours as needed for mild pain. Take 2 tablets (650 mg) by mouth every 4 hours daily as needed    [provider]  albuterol (PROVENTIL HFA;VENTOLIN HFA) 108 (90 BASE) MCG/ACT inhaler Inhale 2 puffs into the lungs every  4 (four) hours as needed for wheezing or shortness of breath.    [provider]  alum & mag hydroxide-simeth (ANTACID LIQUID) 200-200-20 MG/5ML suspension Take 30 mLs by mouth every 6 (six) hours as needed for indigestion or heartburn.    [provider]  atorvastatin (LIPITOR) 10 MG tablet Take 10 mg by mouth at bedtime.     [provider]  barrier cream (NON-SPECIFIED) CREA Apply 1 application topically as needed. Apply topically after toileting as needed to prevent skin breakdown    [provider]  cholecalciferol (VITAMIN D) 1000 units tablet Take 2,000 Units by mouth daily. Give with Vitamin D3 400 units    [provider]  cholecalciferol (VITAMIN D) 400 UNITS TABS tablet Take 400 Units by mouth daily. Give with Vitamin D3 2000 units    [provider]  clonazePAM (KLONOPIN) 0.5 MG tablet Take 0.5 mg by mouth 3 (three) times daily.     [provider]  cyanocobalamin 500 MCG tablet Take 500 mcg by mouth daily.    [provider]  diclofenac sodium (VOLTAREN) 1 % GEL Apply 4 g topically 3 (three) times daily. Apply to both knees    [provider]  diphenhydrAMINE (BENADRYL) 25 MG tablet Take 25 mg by mouth every 6 (six) hours as needed for allergies.    [provider]  DULoxetine (CYMBALTA) 60 MG capsule Take 60 mg by mouth  daily.    [provider]  ferrous sulfate 325 (65 FE) MG tablet Take 325 mg by mouth 3 (three) times daily with meals.    [provider]  gabapentin (NEURONTIN) 100 MG capsule Take 200 mg by mouth 2 (two) times daily.    [provider]  guaiFENesin (ROBITUSSIN) 100 MG/5ML SOLN Take 15 mLs by mouth every 4 (four) hours as needed for cough or to loosen phlegm.    [provider]  HYDROcodone-acetaminophen (NORCO) 5-325 MG tablet Take 1 tablet by mouth every 6 (six) hours as needed for up to 7 doses for severe pain. 05/31/18   Darel Hong, MD    lacosamide 100 MG TABS Take 1 tablet (100 mg total) by mouth 2 (two) times daily. 02/04/15   Hillary Bow, MD  lamoTRIgine (LAMICTAL) 200 MG tablet Take 200-400 mg by mouth 2 (two) times daily. Pt takes 2 tablets (400 mg) in the morning on Monday, Wednesday and Friday and only 1 tablet (200 mg) bid on all other days in the morning.    [provider]  levETIRAcetam (KEPPRA) 1000 MG tablet Take 1 tablet (1,000 mg total) by mouth 2 (two) times daily. Patient taking differently: Take 1,500-2,000 mg by mouth daily. Take 2000 mg by mouth in the morning Tuesday, Thursday, Saturday and Sunday and 1500 mg by mouth every Monday, Wednesday and Friday morning. 11/26/15   Daymon Larsen, MD  loperamide (IMODIUM) 2 MG capsule Take 4 mg by mouth as needed for diarrhea or loose stools.    [provider]  magnesium hydroxide (MILK OF MAGNESIA) 400 MG/5ML suspension Take 30 mLs by mouth daily as needed for mild constipation or moderate constipation.    [provider]  meloxicam (MOBIC) 15 MG tablet Take 1 tablet (15 mg total) by mouth daily. 12/23/15   Cuthriell, Charline Bills, PA-C  mometasone-formoterol (DULERA) 100-5 MCG/ACT AERO Inhale 2 puffs into the lungs 2 (two) times daily.    [provider]  mupirocin ointment (BACTROBAN) 2 % Apply 1 application topically 2 (two) times daily. 10/30/16   Edrick Kins, DPM  Neomycin-Bacitracin-Polymyxin (TRIPLE ANTIBIOTIC) [phone] OINT Apply topically daily.    [provider]  NONFORMULARY OR COMPOUNDED ITEM Apply 1-2 g topically 4 (four) times daily. 04/03/16   Edrick Kins, DPM  ondansetron (ZOFRAN ODT) 4 MG disintegrating tablet Take 1 tablet (4 mg total) by mouth every 8 (eight) hours as needed for nausea or vomiting. 07/21/16   Earleen Newport, MD  pantoprazole (PROTONIX) 40 MG tablet Take 40 mg by mouth daily before breakfast.     [provider]  phenytoin (DILANTIN) 100 MG ER capsule Take 100 mg by  mouth 2 (two) times daily.     [provider]  Powders (GOLD BOND BABY POWDER EX) Apply topically daily as needed. For dry itchy skin    [provider]  pyrithione zinc (HEAD AND SHOULDERS) 1 % shampoo Apply topically daily as needed for itching.    [provider]  traMADol (ULTRAM) 50 MG tablet Take 50 mg by mouth every 6 (six) hours as needed.    [provider]    Allergies Ciprofloxacin and Aspirin  Family History  Problem Relation Age of Onset  . Breast cancer Daughter 41    Social History Social History   Tobacco Use  . Smoking status: Current Every Day Smoker    Packs/day: 0.50    Types: Cigarettes  . Smokeless tobacco: Never Used  Substance Use Topics  . Alcohol use: No  . Drug use: No    Review of Systems  Constitutional: No fever/chills Eyes: No visual changes. ENT: No sore throat. Cardiovascular: Denies chest pain. Respiratory: Denies shortness of breath. Gastrointestinal: No abdominal pain.  No nausea, no vomiting.  No diarrhea.  No constipation. Genitourinary: Negative for dysuria. Musculoskeletal: Negative for back pain. Skin: Negative for rash. Neurological: Negative for headaches, focal weakness or numbness.   ____________________________________________   PHYSICAL EXAM:  VITAL SIGNS: ED Triage Vitals  Enc Vitals Group     BP 07/08/18 1254 122/65     Pulse Rate 07/08/18 1254 60     Resp 07/08/18 1254 16     Temp 07/08/18 1254 99.3 F (37.4 C)     Temp Source 07/08/18 1254 Oral     SpO2 07/08/18 1254 99 %     Weight 07/08/18 1255 178 lb (80.7 kg)     Height 07/08/18 1255 5\' 4"  (1.626 m)     Head Circumference --      Peak Flow --      Pain Score 07/08/18 1255 0     Pain Loc --      Pain Edu? --      Excl. in [place]? --     Constitutional: Alert and oriented. Well appearing and in no acute distress. Eyes: Conjunctivae are normal.  Head: Atraumatic. Nose: No congestion/rhinnorhea. Mouth/Throat:  Mucous membranes are moist.  No tongue bite noted. Neck: No stridor.   Cardiovascular: Normal rate, regular rhythm. Grossly normal heart sounds.   Respiratory: Normal respiratory effort.  No retractions. Lungs CTAB. Gastrointestinal: Soft and nontender. No distention. No CVA tenderness. Musculoskeletal: No lower extremity tenderness nor edema.  No joint effusions. Neurologic:  Normal speech and language.  Right-sided facial drooping which the patient says is chronic and unchanged.  Otherwise, no focal finding.   Skin:  Skin is warm, dry and intact. No rash noted. Psychiatric: Mood and affect are normal. Speech and behavior are normal.  ____________________________________________   LABS (all labs ordered are listed, but only abnormal results are displayed)  Labs Reviewed  CBC WITH DIFFERENTIAL/PLATELET - Abnormal; Notable for the following components:      Result Value   RBC 3.77 (*)    MCV 104.2 (*)    MCH 35.0 (*)    All other components within normal limits  COMPREHENSIVE METABOLIC PANEL - Abnormal; Notable for the following components:   Potassium 3.4 (*)    Glucose, Bld 104 (*)    Calcium 8.6 (*)    All other components within normal limits  URINALYSIS, COMPLETE (UACMP) WITH MICROSCOPIC - Abnormal; Notable for the following components:   Color, Urine YELLOW (*)    APPearance CLEAR (*)    Hgb urine dipstick MODERATE (*)    All other components within normal limits   ____________________________________________  EKG  ED ECG REPORT I, Doran Stabler, the attending physician, personally viewed and interpreted this ECG.   Date: 07/08/2018  EKG Time: 1252  Rate: 61  Rhythm: normal sinus rhythm  Axis: normal  Intervals:nonspecific intraventricular conduction delay  ST&T Change: No ST segment ovation or depression.  No abnormal T wave inversion.  ____________________________________________  RADIOLOGY  Chest x-ray without any active  disease. ____________________________________________   PROCEDURES  Procedure(s) performed:   Procedures  Critical Care performed:   ____________________________________________   INITIAL IMPRESSION / ASSESSMENT AND PLAN / ED COURSE  Pertinent labs & imaging results that were available during  my care of the patient were reviewed by me and considered in my medical decision making (see chart for details).  DDX: Breakthrough seizure, electrolyte abnormality, arrhythmia, medication noncompliance, infectious source As part of my medical decision making, I reviewed the following data within the [facility] Notes from prior ED visits  ----------------------------------------- 3:26 PM on 07/08/2018 -----------------------------------------  I discussed the case with Dr. Doy Mince of neurology who recommends increasing the patient's Vimpat to 150 mg twice daily.  Also recommends deseeding the patient's tramadol and giving the patient a 50 mg bolus of Vimpat prior to being discharged from the hospital today.  Patient is continuing to be at her baseline without any focal neurologic findings, confusion or complaints at this time.  She is understanding the diagnosis as well as treatment plan willing to comply P will be discharged at this time. ____________________________________________   FINAL CLINICAL IMPRESSION(S) / ED DIAGNOSES  Seizure  NEW MEDICATIONS STARTED DURING THIS VISIT:  New Prescriptions   No medications on file     Note:  This document was prepared using Dragon voice recognition software and may include unintentional dictation errors.     Orbie Pyo, MD 07/08/18 [phone]

## 2018-07-08 NOTE — ED Notes (Signed)
Ems here for transport.  Pt alert. Skin warm and dry.  Iv removed.

## 2018-07-08 NOTE — ED Notes (Signed)
Pt moved to 11h.  Pt alert.  Iv in place   No seizure activity noted.  Skin warm and dry.  Seizure pads in place on stretcher.

## 2018-07-08 NOTE — ED Triage Notes (Signed)
Per EMS report, patient had an unwitnessed seizure at [place]. Patient is oriented x4 at this time. EMT reports patient didn't know where she was at. Patient's roommate reported patient had a seizure. CBG 120.

## 2018-07-08 NOTE — ED Notes (Signed)
Patient ambulated to room commode with a steady gait. 

## 2018-07-22 ENCOUNTER — Other Ambulatory Visit: Payer: Self-pay | Admitting: Acute Care

## 2018-07-22 DIAGNOSIS — R569 Unspecified convulsions: Secondary | ICD-10-CM

## 2018-08-01 ENCOUNTER — Ambulatory Visit
Admission: RE | Admit: 2018-08-01 | Discharge: 2018-08-01 | Disposition: A | Discharge status: Home / Self Care | Payer: Medicaid Other | Source: Ambulatory Visit | Attending: Acute Care | Admitting: Acute Care

## 2018-08-01 DIAGNOSIS — R569 Unspecified convulsions: Secondary | ICD-10-CM | POA: Diagnosis present

## 2018-08-30 ENCOUNTER — Other Ambulatory Visit: Payer: Self-pay

## 2018-08-30 ENCOUNTER — Emergency Department
Admission: EM | Admit: 2018-08-30 | Discharge: 2018-08-30 | Disposition: A | Discharge status: Skilled Nursing Facility | Payer: Medicaid Other | Attending: Emergency Medicine | Admitting: Emergency Medicine

## 2018-08-30 ENCOUNTER — Emergency Department: Payer: Medicaid Other

## 2018-08-30 DIAGNOSIS — Y9389 Activity, other specified: Secondary | ICD-10-CM | POA: Insufficient documentation

## 2018-08-30 DIAGNOSIS — J449 Chronic obstructive pulmonary disease, unspecified: Secondary | ICD-10-CM | POA: Insufficient documentation

## 2018-08-30 DIAGNOSIS — W010XXA Fall on same level from slipping, tripping and stumbling without subsequent striking against object, initial encounter: Secondary | ICD-10-CM | POA: Diagnosis not present

## 2018-08-30 DIAGNOSIS — S60222A Contusion of left hand, initial encounter: Secondary | ICD-10-CM | POA: Diagnosis not present

## 2018-08-30 DIAGNOSIS — Y998 Other external cause status: Secondary | ICD-10-CM | POA: Diagnosis not present

## 2018-08-30 DIAGNOSIS — R569 Unspecified convulsions: Secondary | ICD-10-CM | POA: Diagnosis not present

## 2018-08-30 DIAGNOSIS — Y92129 Unspecified place in nursing home as the place of occurrence of the external cause: Secondary | ICD-10-CM | POA: Diagnosis not present

## 2018-08-30 DIAGNOSIS — S6992XA Unspecified injury of left wrist, hand and finger(s), initial encounter: Secondary | ICD-10-CM | POA: Diagnosis present

## 2018-08-30 MED ORDER — TRAMADOL HCL 50 MG PO TABS
50.0000 mg | ORAL_TABLET | ORAL | Status: AC
  Administered 2018-08-30: 50 mg via ORAL
  Filled 2018-08-30: qty 1

## 2018-08-30 NOTE — ED Provider Notes (Signed)
[facility] Emergency Department Provider Note   ____________________________________________   First MD Initiated Contact with Patient 08/30/18 1845     (approximate)  I have reviewed the triage vital signs and the nursing notes.   HISTORY  Chief Complaint Hand Pain    HPI Marcia Brown is a 63 y.o. female history of COPD, polyneuropathy and seizures  Patient 2 days ago tripped she fell, struck her hand on the floor.  She presents that she has been having a lot of soreness in the left hand especially around the area of thumb wart slightly swollen.  He continues to be painful, prompting her to call for an ambulance to have her left hand evaluated.  She does not have any neck pain did not strike her head.  No loss of consciousness.  She did not feel weak ill or short of breath or have any chest pain.  She reports she simply stumbled, fell forward and hit her left hand pretty hard on the floor  Denies any other injuries.  No hip pain.  No shoulder pain no elbow pain.  Only discomfort and pain is located in the left hand.  She able to move all of her fingers and can feel them normally, but reports when she tries to move especially her thumb it just feels very sore and she thinks she might of broke something in the middle of her hand   Past Medical History:  Diagnosis Date  . Anxiety   . Asthma   . Cerebral ischemia   . COPD (chronic obstructive pulmonary disease) ([place])   . Depression   . High cholesterol   . Polyneuropathy   . Seizures ([place])   . Sleep apnea     Patient Active Problem List   Diagnosis Date Noted  . Seizure ([place]) 02/03/2015    Past Surgical History:  Procedure Laterality Date  . ESOPHAGEAL DILATION    . FOOT SURGERY Right     Prior to Admission medications   Medication Sig Start Date End Date Taking? Authorizing Provider  acetaminophen (MAPAP) 500 MG tablet Take 500 mg by mouth every 6 (six) hours as needed for mild pain or  moderate pain.    [provider]  acetaminophen (TYLENOL) 325 MG tablet Take 325 mg by mouth every 4 (four) hours as needed for mild pain. Take 2 tablets (650 mg) by mouth every 4 hours daily as needed    [provider]  albuterol (PROVENTIL HFA;VENTOLIN HFA) 108 (90 BASE) MCG/ACT inhaler Inhale 2 puffs into the lungs every 4 (four) hours as needed for wheezing or shortness of breath.    [provider]  alum & mag hydroxide-simeth (ANTACID LIQUID) 200-200-20 MG/5ML suspension Take 30 mLs by mouth every 6 (six) hours as needed for indigestion or heartburn.    [provider]  atorvastatin (LIPITOR) 10 MG tablet Take 10 mg by mouth at bedtime.     [provider]  barrier cream (NON-SPECIFIED) CREA Apply 1 application topically as needed. Apply topically after toileting as needed to prevent skin breakdown    [provider]  cholecalciferol (VITAMIN D) 1000 units tablet Take 2,000 Units by mouth daily. Give with Vitamin D3 400 units    [provider]  cholecalciferol (VITAMIN D) 400 UNITS TABS tablet Take 400 Units by mouth daily. Give with Vitamin D3 2000 units    [provider]  clonazePAM (KLONOPIN) 0.5 MG tablet Take 0.5 mg by mouth 3 (three)  times daily.     [provider]  cyanocobalamin 500 MCG tablet Take 500 mcg by mouth daily.    [provider]  diclofenac sodium (VOLTAREN) 1 % GEL Apply 4 g topically 3 (three) times daily. Apply to both knees    [provider]  diphenhydrAMINE (BENADRYL) 25 MG tablet Take 25 mg by mouth every 6 (six) hours as needed for allergies.    [provider]  DULoxetine (CYMBALTA) 60 MG capsule Take 60 mg by mouth daily.    [provider]  ferrous sulfate 325 (65 FE) MG tablet Take 325 mg by mouth 3 (three) times daily with meals.    [provider]  gabapentin (NEURONTIN) 100 MG capsule Take 200 mg by mouth 2 (two) times daily.     [provider]  guaiFENesin (ROBITUSSIN) 100 MG/5ML SOLN Take 15 mLs by mouth every 4 (four) hours as needed for cough or to loosen phlegm.    [provider]  HYDROcodone-acetaminophen (NORCO) 5-325 MG tablet Take 1 tablet by mouth every 6 (six) hours as needed for up to 7 doses for severe pain. 05/31/18   Darel Hong, MD  lacosamide (VIMPAT) 50 MG TABS tablet Take 1 tablet (50 mg total) by mouth 2 (two) times daily. 07/08/18   Schaevitz, Randall An, MD  lacosamide 100 MG TABS Take 1 tablet (100 mg total) by mouth 2 (two) times daily. 02/04/15   Hillary Bow, MD  lamoTRIgine (LAMICTAL) 200 MG tablet Take 200-400 mg by mouth 2 (two) times daily. Pt takes 2 tablets (400 mg) in the morning on Monday, Wednesday and Friday and only 1 tablet (200 mg) bid on all other days in the morning.    [provider]  levETIRAcetam (KEPPRA) 1000 MG tablet Take 1 tablet (1,000 mg total) by mouth 2 (two) times daily. Patient taking differently: Take 1,500-2,000 mg by mouth daily. Take 2000 mg by mouth in the morning Tuesday, Thursday, Saturday and Sunday and 1500 mg by mouth every Monday, Wednesday and Friday morning. 11/26/15   Daymon Larsen, MD  loperamide (IMODIUM) 2 MG capsule Take 4 mg by mouth as needed for diarrhea or loose stools.    [provider]  magnesium hydroxide (MILK OF MAGNESIA) 400 MG/5ML suspension Take 30 mLs by mouth daily as needed for mild constipation or moderate constipation.    [provider]  meloxicam (MOBIC) 15 MG tablet Take 1 tablet (15 mg total) by mouth daily. 12/23/15   Cuthriell, Charline Bills, PA-C  mometasone-formoterol (DULERA) 100-5 MCG/ACT AERO Inhale 2 puffs into the lungs 2 (two) times daily.    [provider]  mupirocin ointment (BACTROBAN) 2 % Apply 1 application topically 2 (two) times daily. 10/30/16   Edrick Kins, DPM  Neomycin-Bacitracin-Polymyxin (TRIPLE ANTIBIOTIC) [phone] OINT Apply topically daily.     [provider]  NONFORMULARY OR COMPOUNDED ITEM Apply 1-2 g topically 4 (four) times daily. 04/03/16   Edrick Kins, DPM  ondansetron (ZOFRAN ODT) 4 MG disintegrating tablet Take 1 tablet (4 mg total) by mouth every 8 (eight) hours as needed for nausea or vomiting. 07/21/16   Earleen Newport, MD  pantoprazole (PROTONIX) 40 MG tablet Take 40 mg by mouth daily before breakfast.     [provider]  phenytoin (DILANTIN) 100 MG ER capsule Take 100 mg by mouth 2 (two) times daily.     [provider]  Powders (GOLD BOND BABY POWDER EX) Apply topically daily as needed.  For dry itchy skin    [provider]  pyrithione zinc (HEAD AND SHOULDERS) 1 % shampoo Apply topically daily as needed for itching.    [provider]  traMADol (ULTRAM) 50 MG tablet Take 50 mg by mouth every 6 (six) hours as needed.    [provider]    Allergies Ciprofloxacin and Aspirin  Family History  Problem Relation Age of Onset  . Breast cancer Daughter 62    Social History Social History   Tobacco Use  . Smoking status: Current Every Day Smoker    Packs/day: 0.50    Types: Cigarettes  . Smokeless tobacco: Never Used  Substance Use Topics  . Alcohol use: No  . Drug use: No    Review of Systems Constitutional: No fever/chills Eyes: No visual changes. ENT: No sore throat. Cardiovascular: Denies chest pain. Respiratory: Denies shortness of breath. Gastrointestinal: No abdominal pain.   Musculoskeletal: Negative for back pain.  See HPI.  No neck pain. Skin: Negative for rash. Neurological: Negative for headaches, areas of focal weakness or numbness.    ____________________________________________   PHYSICAL EXAM:  VITAL SIGNS: ED Triage Vitals  Enc Vitals Group     BP 08/30/18 1848 (!) 122/105     Pulse Rate 08/30/18 1846 (!) 55     Resp --      Temp 08/30/18 1846 98.2 F (36.8 C)     Temp Source 08/30/18 1846 Oral     SpO2 08/30/18  1846 97 %     Weight 08/30/18 1853 200 lb (90.7 kg)     Height 08/30/18 1853 5\' 4"  (1.626 m)     Head Circumference --      Peak Flow --      Pain Score 08/30/18 1848 0     Pain Loc --      Pain Edu? --      Excl. in [place]? --     Constitutional: Alert and oriented. Well appearing and in no acute distress.  She is very pleasant.  She is holding her left hand utilizing her right to hold but reports discomfort and pain around the area of the thumb and mid hand. Eyes: Conjunctivae are normal. Head: Atraumatic. Nose: No congestion/rhinnorhea. Mouth/Throat: Mucous membranes are moist. Neck: No stridor.  Cardiovascular: Normal rate, regular rhythm. Grossly normal heart sounds.  Good peripheral circulation. Respiratory: Normal respiratory effort.  No retractions. Lungs CTAB. Gastrointestinal: Soft and nontender. No distention. Musculoskeletal:   RIGHT Right upper extremity demonstrates normal strength, good use of all muscles. No edema bruising or contusions of the right shoulder/upper arm, right elbow, right forearm / hand. Full range of motion of the right right upper extremity without pain. No evidence of trauma. Strong radial pulse. Intact median/ulnar/radial neuro-muscular exam.  LEFT Left upper extremity demonstrates normal strength, good use of all muscles. No edema bruising or contusions of the left shoulder/upper arm, left elbow, left forearm.  The wrist is nontender without deformity or bruising.  Full range of motion of the left  upper extremity without pain except in the left hand. No evidence of trauma except for some swelling primarily over the thenar eminence of the left hand without laceration. Strong radial pulse. Intact median/ulnar/radial neuro-muscular exam.  Lower Extremities  No edema. Normal DP/PT pulses bilateral with good cap refill.  Normal neuro-motor function lower extremities bilateral.  RIGHT Right lower extremity demonstrates normal strength, good use of all  muscles. No edema bruising or contusions of the right hip, right  knee, right ankle. Full range of motion of the right lower extremity without pain. No pain on axial loading. No evidence of trauma.  LEFT Left lower extremity demonstrates normal strength, good use of all muscles. No edema bruising or contusions of the hip,  knee, ankle. Full range of motion of the left lower extremity without pain. No pain on axial loading. No evidence of trauma.   Neurologic:  Normal speech and language. No gross focal neurologic deficits are appreciated.  Skin:  Skin is warm, dry and intact. No rash noted. Psychiatric: Mood and affect are normal. Speech and behavior are normal.  ____________________________________________   LABS (all labs ordered are listed, but only abnormal results are displayed)  Labs Reviewed - No data to display ____________________________________________  EKG   ____________________________________________  RADIOLOGY  Dg Hand Complete Left  Result Date: 08/30/2018 CLINICAL DATA:  [place] 2 days ago, LEFT hand pain, limited hand and finger movement, pain greatest at thumb EXAM: LEFT HAND - COMPLETE 3+ VIEW COMPARISON:  LEFT wrist radiographs 04/28/2018 FINDINGS: Osseous demineralization. Degenerative changes first [facility] joint with joint space narrowing, spur formation and slight radial subluxation of the first metacarpal base. Additional degenerative changes at IP joint LEFT thumb with radial spurring and probable adjacent loose body. No acute fracture, dislocation, or bone destruction. IMPRESSION: Osseous demineralization with degenerative changes at first [facility] joint and IP joint LEFT thumb. No acute bony abnormalities. Electronically Signed   By: Lavonia Dana M.D.   On: 08/30/2018 19:45     ____________________________________________   PROCEDURES  Procedure(s) performed: None  Procedures  Critical Care performed: No  ____________________________________________    INITIAL IMPRESSION / ASSESSMENT AND PLAN / ED COURSE  Pertinent labs & imaging results that were available during my care of the patient were reviewed by me and considered in my medical decision making (see chart for details).   Patient presents for left hand pain after mechanical fall.  No head injury no neck pain.  Reassuring clinical exam.  Suspect based on the location of her tenderness over the thenar eminence and region of the mid hand it is potential that there could be a scaphoid injury.  X-rays do not show an obvious radiographic dislocation or fracture.  There is some slight subluxation of the first metacarpal base however there is also a what appears to be notable degenerative changes.  Use fracture or dislocation.  No systemic symptoms, no concerning symptoms that led to her fall.  Described as purely mechanical in nature.  Discussed with the patient, will place her into a thumb spica, she will follow-up with doctors making house calls in about a week's time for reevaluation.  She has a prescription for tramadol already.  Pain is well controlled at this time.  Return precautions and treatment recommendations and follow-up discussed with the patient who is agreeable with the plan.   Clinical Course as of Aug 30 2051  Sat Aug 30, 2018  2052 Patient has thumb spica on, she reports her pain is much better.  Normal capillary refill able to wiggle all fingers well denies any numbness or tingling in the left hand on reexam.  Appears well.  We will discharge the patient, she reports she can follow-up with doctors making house calls in 1 week.   [MQ]    Clinical Course User Index [MQ] Delman Kitten, MD   ----------------------------------------- 8:53 PM on 08/30/2018 -----------------------------------------  Return precautions and treatment recommendations and follow-up discussed with the patient who is agreeable with the  plan.   ____________________________________________   FINAL  CLINICAL IMPRESSION(S) / ED DIAGNOSES  Final diagnoses:  Contusion of left hand, initial encounter        Note:  This document was prepared using Dragon voice recognition software and may include unintentional dictation errors       Delman Kitten, MD 08/30/18 2053

## 2018-08-30 NOTE — ED Notes (Signed)
This RN and the pt has attempted to call niece twice in order to pick pt up

## 2018-08-30 NOTE — ED Triage Notes (Addendum)
Had a fall 2 days ago and is having pain in her left hand- limited hand and finger movement

## 2019-03-04 ENCOUNTER — Other Ambulatory Visit: Payer: Self-pay

## 2019-03-27 ENCOUNTER — Other Ambulatory Visit: Payer: Self-pay

## 2019-03-27 ENCOUNTER — Emergency Department: Payer: Medicare Other

## 2019-03-27 ENCOUNTER — Encounter: Payer: Self-pay | Admitting: Emergency Medicine

## 2019-03-27 ENCOUNTER — Emergency Department
Admission: EM | Admit: 2019-03-27 | Discharge: 2019-03-27 | Disposition: A | Discharge status: Home / Self Care | Payer: Medicare Other | Attending: Emergency Medicine | Admitting: Emergency Medicine

## 2019-03-27 DIAGNOSIS — J449 Chronic obstructive pulmonary disease, unspecified: Secondary | ICD-10-CM | POA: Insufficient documentation

## 2019-03-27 DIAGNOSIS — R079 Chest pain, unspecified: Secondary | ICD-10-CM

## 2019-03-27 DIAGNOSIS — R0789 Other chest pain: Secondary | ICD-10-CM | POA: Insufficient documentation

## 2019-03-27 DIAGNOSIS — J45909 Unspecified asthma, uncomplicated: Secondary | ICD-10-CM | POA: Diagnosis not present

## 2019-03-27 DIAGNOSIS — F17210 Nicotine dependence, cigarettes, uncomplicated: Secondary | ICD-10-CM | POA: Diagnosis not present

## 2019-03-27 DIAGNOSIS — R609 Edema, unspecified: Secondary | ICD-10-CM

## 2019-03-27 LAB — COMPREHENSIVE METABOLIC PANEL
ALT: 20 U/L (ref 0–44)
AST: 22 U/L (ref 15–41)
Albumin: 3.8 g/dL (ref 3.5–5.0)
Alkaline Phosphatase: 51 U/L (ref 38–126)
Anion gap: 7 (ref 5–15)
BUN: 13 mg/dL (ref 8–23)
CO2: 27 mmol/L (ref 22–32)
Calcium: 8.8 mg/dL — ABNORMAL LOW (ref 8.9–10.3)
Chloride: 106 mmol/L (ref 98–111)
Creatinine, Ser: 0.7 mg/dL (ref 0.44–1.00)
GFR calc Af Amer: >60 mL/min (ref >60)
GFR calc non Af Amer: >60 mL/min (ref >60)
Glucose, Bld: 89 mg/dL (ref 70–99)
Potassium: 3.7 mmol/L (ref 3.5–5.1)
Sodium: 140 mmol/L (ref 135–145)
Total Bilirubin: 0.5 mg/dL (ref 0.3–1.2)
Total Protein: 6.4 g/dL — ABNORMAL LOW (ref 6.5–8.1)

## 2019-03-27 LAB — CBC WITH DIFFERENTIAL/PLATELET
Abs Immature Granulocytes: 0.02 10*3/uL (ref 0.00–0.07)
Basophils Absolute: 0 10*3/uL (ref 0.0–0.1)
Basophils Relative: 1 %
Eosinophils Absolute: 0.1 10*3/uL (ref 0.0–0.5)
Eosinophils Relative: 3 %
HCT: 35.9 % — ABNORMAL LOW (ref 36.0–46.0)
Hemoglobin: 12.4 g/dL (ref 12.0–15.0)
Immature Granulocytes: 1 %
Lymphocytes Relative: 29 %
Lymphs Abs: 1.2 10*3/uL (ref 0.7–4.0)
MCH: 35.1 pg — ABNORMAL HIGH (ref 26.0–34.0)
MCHC: 34.5 g/dL (ref 30.0–36.0)
MCV: 101.7 fL — ABNORMAL HIGH (ref 80.0–100.0)
Monocytes Absolute: 0.4 10*3/uL (ref 0.1–1.0)
Monocytes Relative: 10 %
Neutro Abs: 2.3 10*3/uL (ref 1.7–7.7)
Neutrophils Relative %: 56 %
Platelets: 188 10*3/uL (ref 150–400)
RBC: 3.53 MIL/uL — ABNORMAL LOW (ref 3.87–5.11)
RDW: 14 % (ref 11.5–15.5)
WBC: 4.1 10*3/uL (ref 4.0–10.5)
nRBC: 0 % (ref 0.0–0.2)

## 2019-03-27 LAB — TROPONIN I (HIGH SENSITIVITY)
Troponin I (High Sensitivity): 5 ng/L (ref <18)
Troponin I (High Sensitivity): 5 ng/L (ref <18)

## 2019-03-27 MED ORDER — IOHEXOL 350 MG/ML SOLN
100.0000 mL | Freq: Once | INTRAVENOUS | Status: AC | PRN
  Administered 2019-03-27: 100 mL via INTRAVENOUS

## 2019-03-27 MED ORDER — ACETAMINOPHEN 500 MG PO TABS
1000.0000 mg | ORAL_TABLET | Freq: Once | ORAL | Status: AC
  Administered 2019-03-27: 1000 mg via ORAL
  Filled 2019-03-27: qty 2

## 2019-03-27 NOTE — Discharge Instructions (Addendum)
Please seek medical attention for any high fevers, chest pain, shortness of breath, change in behavior, persistent vomiting, bloody stool or any other new or concerning symptoms.  

## 2019-03-27 NOTE — ED Notes (Signed)
This pt has requested transport via ambulance, [place] [facility] [place] reports no transport available, Dr Kermit Balo at bedside and this RN conducted DC instructions

## 2019-03-27 NOTE — ED Triage Notes (Signed)
Pt to ED via [facility] from [place] for left sided chest pain and left arm swelling. Pt states that she has been having pain in her chest for the past few days. Pt reports that the swelling in her arm has been there for "a while". Pt is in NAD at this time. A & O x 3.

## 2019-03-27 NOTE — ED Notes (Signed)
Returned from CT.

## 2019-03-27 NOTE — ED Provider Notes (Signed)
CT chest without concerning symptoms. The patient's second troponin without concerning elevation. Discussed this with patient. Discussed follow up with PCP.   Nance Pear, MD 03/27/19 2156

## 2019-03-27 NOTE — ED Notes (Signed)
Patient transported to CT 

## 2019-03-27 NOTE — ED Provider Notes (Addendum)
[facility] Emergency Department Provider Note  ____________________________________________   First MD Initiated Contact with Patient 03/27/19 1412     (approximate)  I have reviewed the triage vital signs and the nursing notes.   HISTORY  Chief Complaint Chest Pain and Arm Swelling    HPI Marcia Brown is a 63 y.o. female with anxiety, COPD, seizures who comes in from [place] for left-sided chest pain and left arm swelling.  The chest pain is been going on for the past few days, constant, nothing makes it better, nothing makes it worse she also endorses left arm swelling but says it has been going on for months to years.  Patient is not a great historian.  Denies any fevers, cough.      Past Medical History:  Diagnosis Date   Anxiety    Asthma    Cerebral ischemia    COPD (chronic obstructive pulmonary disease) ([place])    Depression    High cholesterol    Polyneuropathy    Seizures (HCC)    Sleep apnea     Patient Active Problem List   Diagnosis Date Noted   Seizure ([place]) 02/03/2015    Past Surgical History:  Procedure Laterality Date   ESOPHAGEAL DILATION     FOOT SURGERY Right     Prior to Admission medications   Medication Sig Start Date End Date Taking? Authorizing Provider  acetaminophen (MAPAP) 500 MG tablet Take 500 mg by mouth every 6 (six) hours as needed for mild pain or moderate pain.    [provider]  acetaminophen (TYLENOL) 325 MG tablet Take 325 mg by mouth every 4 (four) hours as needed for mild pain. Take 2 tablets (650 mg) by mouth every 4 hours daily as needed    [provider]  albuterol (PROVENTIL HFA;VENTOLIN HFA) 108 (90 BASE) MCG/ACT inhaler Inhale 2 puffs into the lungs every 4 (four) hours as needed for wheezing or shortness of breath.    [provider]  alum & mag hydroxide-simeth (ANTACID LIQUID) 200-200-20 MG/5ML suspension Take 30 mLs by mouth every 6 (six)  hours as needed for indigestion or heartburn.    [provider]  atorvastatin (LIPITOR) 10 MG tablet Take 10 mg by mouth at bedtime.     [provider]  barrier cream (NON-SPECIFIED) CREA Apply 1 application topically as needed. Apply topically after toileting as needed to prevent skin breakdown    [provider]  cholecalciferol (VITAMIN D) 1000 units tablet Take 2,000 Units by mouth daily. Give with Vitamin D3 400 units    [provider]  cholecalciferol (VITAMIN D) 400 UNITS TABS tablet Take 400 Units by mouth daily. Give with Vitamin D3 2000 units    [provider]  clonazePAM (KLONOPIN) 0.5 MG tablet Take 0.5 mg by mouth 3 (three) times daily.     [provider]  cyanocobalamin 500 MCG tablet Take 500 mcg by mouth daily.    [provider]  diclofenac sodium (VOLTAREN) 1 % GEL Apply 4 g topically 3 (three) times daily. Apply to both knees    [provider]  diphenhydrAMINE (BENADRYL) 25 MG tablet Take 25 mg by mouth every 6 (six) hours as needed for allergies.    [provider]  DULoxetine (CYMBALTA) 60 MG capsule Take 60 mg by mouth daily.    [provider]  ferrous sulfate 325 (65 FE) MG tablet Take 325 mg by mouth 3 (three) times daily  with meals.    [provider]  gabapentin (NEURONTIN) 100 MG capsule Take 200 mg by mouth 2 (two) times daily.    [provider]  guaiFENesin (ROBITUSSIN) 100 MG/5ML SOLN Take 15 mLs by mouth every 4 (four) hours as needed for cough or to loosen phlegm.    [provider]  HYDROcodone-acetaminophen (NORCO) 5-325 MG tablet Take 1 tablet by mouth every 6 (six) hours as needed for up to 7 doses for severe pain. 05/31/18   Darel Hong, MD  lacosamide (VIMPAT) 50 MG TABS tablet Take 1 tablet (50 mg total) by mouth 2 (two) times daily. 07/08/18   Schaevitz, Randall An, MD  lacosamide 100 MG TABS Take 1 tablet (100 mg total) by mouth 2  (two) times daily. 02/04/15   Hillary Bow, MD  lamoTRIgine (LAMICTAL) 200 MG tablet Take 200-400 mg by mouth 2 (two) times daily. Pt takes 2 tablets (400 mg) in the morning on Monday, Wednesday and Friday and only 1 tablet (200 mg) bid on all other days in the morning.    [provider]  levETIRAcetam (KEPPRA) 1000 MG tablet Take 1 tablet (1,000 mg total) by mouth 2 (two) times daily. Patient taking differently: Take 1,500-2,000 mg by mouth daily. Take 2000 mg by mouth in the morning Tuesday, Thursday, Saturday and Sunday and 1500 mg by mouth every Monday, Wednesday and Friday morning. 11/26/15   Daymon Larsen, MD  loperamide (IMODIUM) 2 MG capsule Take 4 mg by mouth as needed for diarrhea or loose stools.    [provider]  magnesium hydroxide (MILK OF MAGNESIA) 400 MG/5ML suspension Take 30 mLs by mouth daily as needed for mild constipation or moderate constipation.    [provider]  meloxicam (MOBIC) 15 MG tablet Take 1 tablet (15 mg total) by mouth daily. 12/23/15   Cuthriell, Charline Bills, PA-C  mometasone-formoterol (DULERA) 100-5 MCG/ACT AERO Inhale 2 puffs into the lungs 2 (two) times daily.    [provider]  mupirocin ointment (BACTROBAN) 2 % Apply 1 application topically 2 (two) times daily. 10/30/16   Edrick Kins, DPM  Neomycin-Bacitracin-Polymyxin (TRIPLE ANTIBIOTIC) [phone] OINT Apply topically daily.    [provider]  NONFORMULARY OR COMPOUNDED ITEM Apply 1-2 g topically 4 (four) times daily. 04/03/16   Edrick Kins, DPM  ondansetron (ZOFRAN ODT) 4 MG disintegrating tablet Take 1 tablet (4 mg total) by mouth every 8 (eight) hours as needed for nausea or vomiting. 07/21/16   Earleen Newport, MD  pantoprazole (PROTONIX) 40 MG tablet Take 40 mg by mouth daily before breakfast.     [provider]  phenytoin (DILANTIN) 100 MG ER capsule Take 100 mg by mouth 2 (two) times daily.     [provider]  Powders  (GOLD BOND BABY POWDER EX) Apply topically daily as needed. For dry itchy skin    [provider]  pyrithione zinc (HEAD AND SHOULDERS) 1 % shampoo Apply topically daily as needed for itching.    [provider]  traMADol (ULTRAM) 50 MG tablet Take 50 mg by mouth every 6 (six) hours as needed.    [provider]    Allergies Ciprofloxacin and Aspirin  Family History  Problem Relation Age of Onset   Breast cancer Daughter 57    Social History Social History   Tobacco Use   Smoking status: Current Every Day Smoker    Packs/day: 0.50    Types: Cigarettes   Smokeless tobacco: Never  Used  Substance Use Topics   Alcohol use: No   Drug use: No      Review of Systems Constitutional: No fever/chills Eyes: No visual changes. ENT: No sore throat. Cardiovascular: Positive chest pain Respiratory: Denies shortness of breath. Gastrointestinal: No abdominal pain.  No nausea, no vomiting.  No diarrhea.  No constipation. Genitourinary: Negative for dysuria. Musculoskeletal: Negative for back pain.  Left arm swelling Skin: Negative for rash. Neurological: Negative for headaches, focal weakness or numbness. All other ROS negative ____________________________________________   PHYSICAL EXAM:  VITAL SIGNS: Blood pressure (!) 141/65, pulse 80, temperature 98.2 F (36.8 C), temperature source Oral, resp. rate 16, SpO2 97 %.   Constitutional: Alert and oriented. Well appearing and in no acute distress. Eyes: Conjunctivae are normal. EOMI. Head: Atraumatic. Nose: No congestion/rhinnorhea. Mouth/Throat: Mucous membranes are moist.   Neck: No stridor. Trachea Midline. FROM Cardiovascular: Normal rate, regular rhythm. Grossly normal heart sounds.  Good peripheral circulation.  Decreased pulse on the right wrist but still palable. Pt states she may have had arterial line in there before. No changes in color of skin. Normal cap refill bilaterally.    Respiratory: Normal respiratory effort.  No retractions. Lungs CTAB. Gastrointestinal: Soft and nontender. No distention. No abdominal bruits.  Musculoskeletal: No lower extremity tenderness nor edema.  No joint effusions.  Left arm larger than the right slightly.  Neurologic:  Normal speech and language. No gross focal neurologic deficits are appreciated.  Skin:  Skin is warm, dry and intact. No rash noted. Psychiatric: Mood and affect are normal. Speech and behavior are normal. GU: Deferred   ____________________________________________   LABS (all labs ordered are listed, but only abnormal results are displayed)  Labs Reviewed  CBC WITH DIFFERENTIAL/PLATELET  COMPREHENSIVE METABOLIC PANEL  TROPONIN I (HIGH SENSITIVITY)   ____________________________________________   ED ECG REPORT I, Vanessa Slick, the attending physician, personally viewed and interpreted this ECG.  EKG is normal sinus rate of 63, no ST elevation no T wave inversions, QTC of 478  ____________________________________________  RADIOLOGY Robert Bellow, personally viewed and evaluated these images (plain radiographs) as part of my medical decision making, as well as reviewing the written report by the radiologist.  ED MD interpretation: No pneumonia.  Official radiology report(s): Dg Chest 1 View  Result Date: 03/27/2019 CLINICAL DATA:  Chest pain EXAM: CHEST  1 VIEW COMPARISON:  07/08/2018 FINDINGS: Cardiomegaly. Large hiatal hernia. Both lungs are clear. The visualized skeletal structures are unremarkable. IMPRESSION: 1.  Cardiomegaly without acute abnormality of the lungs. 2.  Large hiatal hernia. Electronically Signed   By: Eddie Candle M.D.   On: 03/27/2019 15:09   US Venous Img Upper Uni Left  Result Date: 03/27/2019 CLINICAL DATA:  Left arm swelling and pain EXAM: LEFT UPPER EXTREMITY VENOUS DOPPLER ULTRASOUND TECHNIQUE: Gray-scale sonography with graded compression, as well as color Doppler and  duplex ultrasound were performed to evaluate the upper extremity deep venous system from the level of the subclavian vein and including the jugular, axillary, basilic, radial, ulnar and upper cephalic vein. Spectral Doppler was utilized to evaluate flow at rest and with distal augmentation maneuvers. COMPARISON:  None. FINDINGS: Contralateral Subclavian Vein: Respiratory phasicity is normal and symmetric with the symptomatic side. No evidence of thrombus. Normal compressibility. Internal Jugular Vein: No evidence of thrombus. Normal compressibility, respiratory phasicity and response to augmentation. Subclavian Vein: No evidence of thrombus. Normal compressibility, respiratory phasicity and response to augmentation. Axillary Vein: No evidence of thrombus. Normal compressibility,  respiratory phasicity and response to augmentation. Cephalic Vein: No evidence of thrombus. Normal compressibility, respiratory phasicity and response to augmentation. Basilic Vein: No evidence of thrombus. Normal compressibility, respiratory phasicity and response to augmentation. Brachial Veins: No evidence of thrombus. Normal compressibility, respiratory phasicity and response to augmentation. Radial Veins: No evidence of thrombus. Normal compressibility, respiratory phasicity and response to augmentation. Ulnar Veins: No evidence of thrombus. Normal compressibility, respiratory phasicity and response to augmentation. Venous Reflux:  Not assessed Other Findings:  None visualized. IMPRESSION: No evidence of DVT within the left upper extremity. Electronically Signed   By: Jerilynn Mages.  Shick M.D.   On: 03/27/2019 15:07    ____________________________________________   PROCEDURES  Procedure(s) performed (including Critical Care):  Procedures   ____________________________________________   INITIAL IMPRESSION / ASSESSMENT AND PLAN / ED COURSE   CLORISA ANTUNEZ was evaluated in Emergency Department on 03/27/2019 for the symptoms  described in the history of present illness. She was evaluated in the context of the global COVID-19 pandemic, which necessitated consideration that the patient might be at risk for infection with the SARS-CoV-2 virus that causes COVID-19. Institutional protocols and algorithms that pertain to the evaluation of patients at risk for COVID-19 are in a state of rapid change based on information released by regulatory bodies including the CDC and federal and state organizations. These policies and algorithms were followed during the patient's care in the ED.    Most Likely DDx:  -MSK (atypical chest pain) but will get cardiac markers to evaluate for ACS given risk factors/age -Given unequal pulses will get CT dissection as well as a can help make sure there is no evidence of mass causing the swelling on the left arm or large proximal arterial occlusion although both arms are warm and feel well perfused and the swelling is minimal in nature.   DDx that was also considered d/t potential to cause harm, but was found less likely based on history and physical (as detailed above): -PNA (no fevers, cough but CXR to evaluate) -PNX (reassured with equal b/l breath sounds, CXR to evaluate) -Symptomatic anemia (will get H&H) -Pulmonary embolism as no sob at rest, not pleuritic in nature, no hypoxia -Pericarditis no rub on exam, EKG changes or hx to suggest dx -Tamponade (no notable SOB, tachycardic, hypotensive) -Esophageal rupture (no h/o diffuse vomitting/no crepitus)  Ultrasound was negative.  Chest x-ray was negative.  Added on CT chest.  Patient handed off to oncoming team pending labs and imaging.  If negative patient can be discharged back to facility.  ____________________________________________   FINAL CLINICAL IMPRESSION(S) / ED DIAGNOSES   Final diagnoses:  Chest pain     MEDICATIONS GIVEN DURING THIS VISIT:  Medications  acetaminophen (TYLENOL) tablet 1,000 mg (has no administration in  time range)     ED Discharge Orders    None       Note:  This document was prepared using Dragon voice recognition software and may include unintentional dictation errors.   Vanessa Florala, MD 03/27/19 1537    Vanessa Boulder, MD 03/27/19 1540

## 2019-05-30 ENCOUNTER — Emergency Department
Admission: EM | Admit: 2019-05-30 | Discharge: 2019-05-31 | Disposition: A | Discharge status: Skilled Nursing Facility | Payer: Medicare Other | Attending: Emergency Medicine | Admitting: Emergency Medicine

## 2019-05-30 ENCOUNTER — Encounter: Payer: Self-pay | Admitting: Emergency Medicine

## 2019-05-30 ENCOUNTER — Emergency Department: Payer: Medicare Other

## 2019-05-30 ENCOUNTER — Other Ambulatory Visit: Payer: Self-pay

## 2019-05-30 DIAGNOSIS — E876 Hypokalemia: Secondary | ICD-10-CM | POA: Diagnosis not present

## 2019-05-30 DIAGNOSIS — W19XXXA Unspecified fall, initial encounter: Secondary | ICD-10-CM | POA: Diagnosis not present

## 2019-05-30 DIAGNOSIS — Y999 Unspecified external cause status: Secondary | ICD-10-CM | POA: Insufficient documentation

## 2019-05-30 DIAGNOSIS — Y9289 Other specified places as the place of occurrence of the external cause: Secondary | ICD-10-CM | POA: Insufficient documentation

## 2019-05-30 DIAGNOSIS — Z79899 Other long term (current) drug therapy: Secondary | ICD-10-CM | POA: Insufficient documentation

## 2019-05-30 DIAGNOSIS — J45909 Unspecified asthma, uncomplicated: Secondary | ICD-10-CM | POA: Diagnosis not present

## 2019-05-30 DIAGNOSIS — Z87820 Personal history of traumatic brain injury: Secondary | ICD-10-CM | POA: Diagnosis not present

## 2019-05-30 DIAGNOSIS — Y9389 Activity, other specified: Secondary | ICD-10-CM | POA: Diagnosis not present

## 2019-05-30 DIAGNOSIS — F17210 Nicotine dependence, cigarettes, uncomplicated: Secondary | ICD-10-CM | POA: Diagnosis not present

## 2019-05-30 DIAGNOSIS — S0990XA Unspecified injury of head, initial encounter: Secondary | ICD-10-CM | POA: Diagnosis present

## 2019-05-30 LAB — CBC WITH DIFFERENTIAL/PLATELET
Abs Immature Granulocytes: 0.01 10*3/uL (ref 0.00–0.07)
Basophils Absolute: 0 10*3/uL (ref 0.0–0.1)
Basophils Relative: 1 %
Eosinophils Absolute: 0.2 10*3/uL (ref 0.0–0.5)
Eosinophils Relative: 3 %
HCT: 36 % (ref 36.0–46.0)
Hemoglobin: 12.2 g/dL (ref 12.0–15.0)
Immature Granulocytes: 0 %
Lymphocytes Relative: 32 %
Lymphs Abs: 1.4 10*3/uL (ref 0.7–4.0)
MCH: 34.9 pg — ABNORMAL HIGH (ref 26.0–34.0)
MCHC: 33.9 g/dL (ref 30.0–36.0)
MCV: 102.9 fL — ABNORMAL HIGH (ref 80.0–100.0)
Monocytes Absolute: 0.5 10*3/uL (ref 0.1–1.0)
Monocytes Relative: 12 %
Neutro Abs: 2.3 10*3/uL (ref 1.7–7.7)
Neutrophils Relative %: 52 %
Platelets: 192 10*3/uL (ref 150–400)
RBC: 3.5 MIL/uL — ABNORMAL LOW (ref 3.87–5.11)
RDW: 13.2 % (ref 11.5–15.5)
WBC: 4.4 10*3/uL (ref 4.0–10.5)
nRBC: 0 % (ref 0.0–0.2)

## 2019-05-30 LAB — COMPREHENSIVE METABOLIC PANEL
ALT: 22 U/L (ref 0–44)
AST: 23 U/L (ref 15–41)
Albumin: 3.9 g/dL (ref 3.5–5.0)
Alkaline Phosphatase: 49 U/L (ref 38–126)
Anion gap: 9 (ref 5–15)
BUN: 12 mg/dL (ref 8–23)
CO2: 27 mmol/L (ref 22–32)
Calcium: 8.8 mg/dL — ABNORMAL LOW (ref 8.9–10.3)
Chloride: 104 mmol/L (ref 98–111)
Creatinine, Ser: 0.76 mg/dL (ref 0.44–1.00)
GFR calc Af Amer: >60 mL/min (ref >60)
GFR calc non Af Amer: >60 mL/min (ref >60)
Glucose, Bld: 107 mg/dL — ABNORMAL HIGH (ref 70–99)
Potassium: 2.6 mmol/L — CL (ref 3.5–5.1)
Sodium: 140 mmol/L (ref 135–145)
Total Bilirubin: 0.4 mg/dL (ref 0.3–1.2)
Total Protein: 6.4 g/dL — ABNORMAL LOW (ref 6.5–8.1)

## 2019-05-30 LAB — TROPONIN I (HIGH SENSITIVITY): Troponin I (High Sensitivity): 6 ng/L (ref <18)

## 2019-05-30 MED ORDER — POTASSIUM CHLORIDE 10 MEQ/100ML IV SOLN
10.0000 meq | INTRAVENOUS | Status: AC
  Administered 2019-05-31 (×2): 10 meq via INTRAVENOUS
  Filled 2019-05-30 (×2): qty 100

## 2019-05-30 MED ORDER — MAGNESIUM SULFATE 2 GM/50ML IV SOLN
2.0000 g | Freq: Once | INTRAVENOUS | Status: AC
  Administered 2019-05-31: 2 g via INTRAVENOUS
  Filled 2019-05-30: qty 50

## 2019-05-30 MED ORDER — POTASSIUM CHLORIDE 20 MEQ/15ML (10%) PO SOLN
40.0000 meq | Freq: Once | ORAL | Status: AC
  Administered 2019-05-31: 40 meq via ORAL
  Filled 2019-05-30: qty 30

## 2019-05-30 NOTE — ED Provider Notes (Signed)
[facility] Emergency Department Provider Note   ____________________________________________   First MD Initiated Contact with Patient 05/30/19 2221     (approximate)  I have reviewed the triage vital signs and the nursing notes.   HISTORY  Chief Complaint Fall   HPI Marcia Brown is a 63 y.o. female patient with history of traumatic brain injury and frequent falls fell and hit her head.  She has some tenderness on the upper and posterior part of the head.         Past Medical History:  Diagnosis Date  . Anxiety   . Asthma   . Cerebral ischemia   . COPD (chronic obstructive pulmonary disease) ([place])   . Depression   . High cholesterol   . Polyneuropathy   . Seizures ([place])   . Sleep apnea     Patient Active Problem List   Diagnosis Date Noted  . Seizure ([place]) 02/03/2015    Past Surgical History:  Procedure Laterality Date  . ESOPHAGEAL DILATION    . FOOT SURGERY Right     Prior to Admission medications   Medication Sig Start Date End Date Taking? Authorizing Provider  acetaminophen (MAPAP) 500 MG tablet Take 500 mg by mouth every 6 (six) hours as needed for mild pain or moderate pain.    [provider]  acetaminophen (TYLENOL) 325 MG tablet Take 325 mg by mouth every 4 (four) hours as needed for mild pain. Take 2 tablets (650 mg) by mouth every 4 hours daily as needed    [provider]  albuterol (PROVENTIL HFA;VENTOLIN HFA) 108 (90 BASE) MCG/ACT inhaler Inhale 2 puffs into the lungs every 4 (four) hours as needed for wheezing or shortness of breath.    [provider]  alum & mag hydroxide-simeth (ANTACID LIQUID) 200-200-20 MG/5ML suspension Take 30 mLs by mouth every 6 (six) hours as needed for indigestion or heartburn.    [provider]  atorvastatin (LIPITOR) 10 MG tablet Take 10 mg by mouth at bedtime.     [provider]  barrier cream (NON-SPECIFIED) CREA Apply 1 application topically  as needed. Apply topically after toileting as needed to prevent skin breakdown    [provider]  cholecalciferol (VITAMIN D) 1000 units tablet Take 2,000 Units by mouth daily. Give with Vitamin D3 400 units    [provider]  cholecalciferol (VITAMIN D) 400 UNITS TABS tablet Take 400 Units by mouth daily. Give with Vitamin D3 2000 units    [provider]  clonazePAM (KLONOPIN) 0.5 MG tablet Take 0.5 mg by mouth 3 (three) times daily.     [provider]  cyanocobalamin 500 MCG tablet Take 500 mcg by mouth daily.    [provider]  diclofenac sodium (VOLTAREN) 1 % GEL Apply 4 g topically 3 (three) times daily. Apply to both knees    [provider]  diphenhydrAMINE (BENADRYL) 25 MG tablet Take 25 mg by mouth every 6 (six) hours as needed for allergies.    [provider]  DULoxetine (CYMBALTA) 60 MG capsule Take 60 mg by mouth daily.    [provider]  ferrous sulfate 325 (65 FE) MG tablet Take 325 mg by mouth 3 (three) times daily with meals.    [provider]  gabapentin (NEURONTIN) 100 MG capsule Take 200 mg by mouth 2 (two) times daily.    [provider]  guaiFENesin (ROBITUSSIN) 100 MG/5ML SOLN Take 15 mLs by mouth every 4 (  four) hours as needed for cough or to loosen phlegm.    [provider]  HYDROcodone-acetaminophen (NORCO) 5-325 MG tablet Take 1 tablet by mouth every 6 (six) hours as needed for up to 7 doses for severe pain. 05/31/18   Darel Hong, MD  lacosamide (VIMPAT) 50 MG TABS tablet Take 1 tablet (50 mg total) by mouth 2 (two) times daily. 07/08/18   Schaevitz, Randall An, MD  lacosamide 100 MG TABS Take 1 tablet (100 mg total) by mouth 2 (two) times daily. 02/04/15   Hillary Bow, MD  lamoTRIgine (LAMICTAL) 200 MG tablet Take 200-400 mg by mouth 2 (two) times daily. Pt takes 2 tablets (400 mg) in the morning on Monday, Wednesday and Friday and only 1 tablet (200 mg) bid on  all other days in the morning.    [provider]  levETIRAcetam (KEPPRA) 1000 MG tablet Take 1 tablet (1,000 mg total) by mouth 2 (two) times daily. Patient taking differently: Take 1,500-2,000 mg by mouth daily. Take 2000 mg by mouth in the morning Tuesday, Thursday, Saturday and Sunday and 1500 mg by mouth every Monday, Wednesday and Friday morning. 11/26/15   Daymon Larsen, MD  loperamide (IMODIUM) 2 MG capsule Take 4 mg by mouth as needed for diarrhea or loose stools.    [provider]  magnesium hydroxide (MILK OF MAGNESIA) 400 MG/5ML suspension Take 30 mLs by mouth daily as needed for mild constipation or moderate constipation.    [provider]  meloxicam (MOBIC) 15 MG tablet Take 1 tablet (15 mg total) by mouth daily. 12/23/15   Cuthriell, Charline Bills, PA-C  mometasone-formoterol (DULERA) 100-5 MCG/ACT AERO Inhale 2 puffs into the lungs 2 (two) times daily.    [provider]  mupirocin ointment (BACTROBAN) 2 % Apply 1 application topically 2 (two) times daily. 10/30/16   Edrick Kins, DPM  Neomycin-Bacitracin-Polymyxin (TRIPLE ANTIBIOTIC) [phone] OINT Apply topically daily.    [provider]  NONFORMULARY OR COMPOUNDED ITEM Apply 1-2 g topically 4 (four) times daily. 04/03/16   Edrick Kins, DPM  ondansetron (ZOFRAN ODT) 4 MG disintegrating tablet Take 1 tablet (4 mg total) by mouth every 8 (eight) hours as needed for nausea or vomiting. 07/21/16   Earleen Newport, MD  pantoprazole (PROTONIX) 40 MG tablet Take 40 mg by mouth daily before breakfast.     [provider]  phenytoin (DILANTIN) 100 MG ER capsule Take 100 mg by mouth 2 (two) times daily.     [provider]  Powders (GOLD BOND BABY POWDER EX) Apply topically daily as needed. For dry itchy skin    [provider]  pyrithione zinc (HEAD AND SHOULDERS) 1 % shampoo Apply topically daily as needed for itching.    [provider]  traMADol  (ULTRAM) 50 MG tablet Take 50 mg by mouth every 6 (six) hours as needed.    [provider]    Allergies Ciprofloxacin and Aspirin  Family History  Problem Relation Age of Onset  . Breast cancer Daughter 14    Social History Social History   Tobacco Use  . Smoking status: Current Every Day Smoker    Packs/day: 0.50    Types: Cigarettes  . Smokeless tobacco: Never Used  Substance Use Topics  . Alcohol use: No  . Drug use: No    Review of Systems  Constitutional: No fever/chills Eyes: No visual changes. ENT: No sore throat. Cardiovascular: Denies chest pain. Respiratory: Denies shortness of breath.  Gastrointestinal: No abdominal pain.  No nausea, no vomiting.  No diarrhea.  No constipation. Genitourinary: Negative for dysuria. Musculoskeletal: Negative for back pain. Skin: Negative for rash. Neurological: Negative for focal weakness   ____________________________________________   PHYSICAL EXAM:  VITAL SIGNS: ED Triage Vitals  Enc Vitals Group     BP 05/30/19 2211 129/69     Pulse Rate 05/30/19 2211 65     Resp 05/30/19 2211 18     Temp 05/30/19 2211 98 F (36.7 C)     Temp Source 05/30/19 2211 Oral     SpO2 05/30/19 2211 98 %     Weight 05/30/19 2212 200 lb (90.7 kg)     Height --      Head Circumference --      Peak Flow --      Pain Score 05/30/19 2212 0     Pain Loc --      Pain Edu? --      Excl. in [place]? --     Constitutional: Alert and oriented. Well appearing and in no acute distress. Eyes: Conjunctivae are normal.  Head: Atraumatic except for area of pain on top and posterior part of the head which is new since this fall. Nose: No congestion/rhinnorhea. Mouth/Throat: Mucous membranes are moist.  Oropharynx non-erythematous. Neck: No stridor. Cardiovascular: Normal rate, regular rhythm. Grossly normal heart sounds.  Good peripheral circulation. Respiratory: Normal respiratory effort.  No retractions. Lungs CTAB. Gastrointestinal: Soft  and nontender. No distention. No abdominal bruits. No CVA tenderness. Musculoskeletal: No lower extremity tenderness nor edema.  Neurologic:  Normal speech and language. No new gross focal neurologic deficits are appreciated.  Skin:  Skin is warm, dry and intact. No rash noted.   ____________________________________________   LABS (all labs ordered are listed, but only abnormal results are displayed)  Labs Reviewed  CBC WITH DIFFERENTIAL/PLATELET - Abnormal; Notable for the following components:      Result Value   RBC 3.50 (*)    MCV 102.9 (*)    MCH 34.9 (*)    All other components within normal limits  COMPREHENSIVE METABOLIC PANEL - Abnormal; Notable for the following components:   Potassium 2.6 (*)    Glucose, Bld 107 (*)    Calcium 8.8 (*)    Total Protein 6.4 (*)    All other components within normal limits  TROPONIN I (HIGH SENSITIVITY)   ____________________________________________  EKG   ____________________________________________  RADIOLOGY  ED MD interpretation:    Official radiology report(s): CT Head Wo Contrast  Result Date: 05/30/2019 CLINICAL DATA:  Mechanical fall, hit head EXAM: CT HEAD WITHOUT CONTRAST TECHNIQUE: Contiguous axial images were obtained from the base of the skull through the vertex without intravenous contrast. COMPARISON:  None. FINDINGS: Brain: No evidence of acute territorial infarction, hemorrhage, hydrocephalus,extra-axial collection or mass lesion/mass effect. There is dilatation the ventricles and sulci consistent with age-related atrophy. Low-attenuation changes in the deep white matter consistent with small vessel ischemia. Vascular: No hyperdense vessel or unexpected calcification. Skull: The skull is intact. No fracture or focal lesion identified. Sinuses/Orbits: The visualized paranasal sinuses and mastoid air cells are clear. The orbits and globes intact. Other: None Cervical spine: Alignment: There is straightening of the  normal cervical lordosis. Skull base and vertebrae: Visualized skull base is intact. No atlanto-occipital dissociation. The vertebral body heights are well maintained. No fracture or pathologic osseous lesion seen. Soft tissues and spinal canal: The visualized paraspinal soft tissues are unremarkable. No prevertebral soft tissue swelling is  seen. The spinal canal is grossly unremarkable, no large epidural collection or significant canal narrowing. Disc levels: Mild disc height loss with disc osteophyte complex and uncovertebral osteophytes are most notable from C4 through C6. There is mild canal narrowing and moderate neural foraminal narrowing at these levels. Upper chest: The lung apices are clear. Thoracic inlet is within normal limits. Aortic atherosclerosis noted. Other: None IMPRESSION: No acute intracranial abnormality. Findings consistent with age related atrophy and chronic small vessel ischemia No acute fracture or malalignment of the spine. Cervical spine spondylosis most notable from C4 through C6. Electronically Signed   By: Prudencio Pair M.D.   On: 05/30/2019 23:39   CT Cervical Spine Wo Contrast  Result Date: 05/30/2019 CLINICAL DATA:  Mechanical fall, hit head EXAM: CT HEAD WITHOUT CONTRAST TECHNIQUE: Contiguous axial images were obtained from the base of the skull through the vertex without intravenous contrast. COMPARISON:  None. FINDINGS: Brain: No evidence of acute territorial infarction, hemorrhage, hydrocephalus,extra-axial collection or mass lesion/mass effect. There is dilatation the ventricles and sulci consistent with age-related atrophy. Low-attenuation changes in the deep white matter consistent with small vessel ischemia. Vascular: No hyperdense vessel or unexpected calcification. Skull: The skull is intact. No fracture or focal lesion identified. Sinuses/Orbits: The visualized paranasal sinuses and mastoid air cells are clear. The orbits and globes intact. Other: None Cervical  spine: Alignment: There is straightening of the normal cervical lordosis. Skull base and vertebrae: Visualized skull base is intact. No atlanto-occipital dissociation. The vertebral body heights are well maintained. No fracture or pathologic osseous lesion seen. Soft tissues and spinal canal: The visualized paraspinal soft tissues are unremarkable. No prevertebral soft tissue swelling is seen. The spinal canal is grossly unremarkable, no large epidural collection or significant canal narrowing. Disc levels: Mild disc height loss with disc osteophyte complex and uncovertebral osteophytes are most notable from C4 through C6. There is mild canal narrowing and moderate neural foraminal narrowing at these levels. Upper chest: The lung apices are clear. Thoracic inlet is within normal limits. Aortic atherosclerosis noted. Other: None IMPRESSION: No acute intracranial abnormality. Findings consistent with age related atrophy and chronic small vessel ischemia No acute fracture or malalignment of the spine. Cervical spine spondylosis most notable from C4 through C6. Electronically Signed   By: Prudencio Pair M.D.   On: 05/30/2019 23:39    ____________________________________________   PROCEDURES  Procedure(s) performed (including Critical Care):  Procedures   ____________________________________________   INITIAL IMPRESSION / ASSESSMENT AND PLAN / ED COURSE Patient with fall and head tenderness.  CT is not back at this point additionally her EKG is different from previously.  Blood work is not back either.  I have signed this patient out to the oncoming physician.              ____________________________________________   FINAL CLINICAL IMPRESSION(S) / ED DIAGNOSES  Final diagnoses:  Fall, initial encounter     ED Discharge Orders    None       Note:  This document was prepared using Dragon voice recognition software and may include unintentional dictation errors.    Nena Polio, MD 05/30/19 2356

## 2019-05-30 NOTE — ED Provider Notes (Signed)
-----------------------------------------   11:22 PM on 05/30/2019 -----------------------------------------  Blood pressure 129/69, pulse 65, temperature 98 F (36.7 C), temperature source Oral, resp. rate 18, weight 90.7 kg, SpO2 98 %.  Assuming care from Dr. Cinda Quest.  In short, Marcia Brown is a 63 y.o. female with a chief complaint of Fall .  Refer to the original H&P for additional details.  The current plan of care is to follow-up imaging and lab results following a mechanical fall where patient struck her head.  Imaging is negative for acute process, but labs are significant for hypokalemia.  Her potassium was noted to be 2.6 and was repleted with both p.o. and IV forms in addition to magnesium repletion.  No obvious cause of her hypokalemia given she is not on any diuretics.  She will need to follow-up with her PCP for recheck of potassium within the next week and was advised of this.    Blake Divine, MD 05/31/19 [phone]

## 2019-05-30 NOTE — ED Triage Notes (Signed)
Pt had a mechanical fall and hit head. Not on blood thinner. No bleeding, bump or abrasion.

## 2019-05-31 DIAGNOSIS — E876 Hypokalemia: Secondary | ICD-10-CM | POA: Diagnosis not present

## 2019-05-31 MED ORDER — POTASSIUM CHLORIDE 20 MEQ/15ML (10%) PO SOLN
20.0000 meq | Freq: Once | ORAL | Status: AC
  Administered 2019-05-31: 20 meq via ORAL
  Filled 2019-05-31: qty 15

## 2019-05-31 NOTE — Discharge Instructions (Signed)
Your imaging today did not show any injuries from the fall.  Your lab work did show a low potassium level.  You were given potassium in the ER to bring this back to normal levels, but please follow-up with your primary care doctor for recheck of your potassium in the next week.

## 2019-06-26 ENCOUNTER — Observation Stay
Admission: EM | Admit: 2019-06-26 | Discharge: 2019-06-27 | Disposition: A | Discharge status: Home / Self Care | Payer: Medicare Other | Attending: Family Medicine | Admitting: Family Medicine

## 2019-06-26 ENCOUNTER — Emergency Department: Payer: Medicare Other

## 2019-06-26 ENCOUNTER — Encounter: Payer: Self-pay | Admitting: Radiology

## 2019-06-26 ENCOUNTER — Other Ambulatory Visit: Payer: Self-pay

## 2019-06-26 DIAGNOSIS — Z8616 Personal history of COVID-19: Secondary | ICD-10-CM | POA: Diagnosis not present

## 2019-06-26 DIAGNOSIS — F17210 Nicotine dependence, cigarettes, uncomplicated: Secondary | ICD-10-CM | POA: Insufficient documentation

## 2019-06-26 DIAGNOSIS — R4182 Altered mental status, unspecified: Secondary | ICD-10-CM | POA: Diagnosis not present

## 2019-06-26 DIAGNOSIS — R569 Unspecified convulsions: Secondary | ICD-10-CM | POA: Diagnosis not present

## 2019-06-26 DIAGNOSIS — F329 Major depressive disorder, single episode, unspecified: Secondary | ICD-10-CM | POA: Diagnosis not present

## 2019-06-26 DIAGNOSIS — J449 Chronic obstructive pulmonary disease, unspecified: Secondary | ICD-10-CM | POA: Insufficient documentation

## 2019-06-26 DIAGNOSIS — M722 Plantar fascial fibromatosis: Secondary | ICD-10-CM

## 2019-06-26 DIAGNOSIS — E7800 Pure hypercholesterolemia, unspecified: Secondary | ICD-10-CM | POA: Insufficient documentation

## 2019-06-26 DIAGNOSIS — Z8673 Personal history of transient ischemic attack (TIA), and cerebral infarction without residual deficits: Secondary | ICD-10-CM | POA: Insufficient documentation

## 2019-06-26 DIAGNOSIS — Z803 Family history of malignant neoplasm of breast: Secondary | ICD-10-CM | POA: Insufficient documentation

## 2019-06-26 DIAGNOSIS — Z7951 Long term (current) use of inhaled steroids: Secondary | ICD-10-CM | POA: Insufficient documentation

## 2019-06-26 DIAGNOSIS — Z1611 Resistance to penicillins: Secondary | ICD-10-CM | POA: Insufficient documentation

## 2019-06-26 DIAGNOSIS — R471 Dysarthria and anarthria: Secondary | ICD-10-CM | POA: Insufficient documentation

## 2019-06-26 DIAGNOSIS — E860 Dehydration: Secondary | ICD-10-CM | POA: Insufficient documentation

## 2019-06-26 DIAGNOSIS — M79671 Pain in right foot: Secondary | ICD-10-CM | POA: Diagnosis present

## 2019-06-26 DIAGNOSIS — Z7902 Long term (current) use of antithrombotics/antiplatelets: Secondary | ICD-10-CM | POA: Insufficient documentation

## 2019-06-26 DIAGNOSIS — G40909 Epilepsy, unspecified, not intractable, without status epilepticus: Secondary | ICD-10-CM | POA: Diagnosis not present

## 2019-06-26 DIAGNOSIS — G9389 Other specified disorders of brain: Secondary | ICD-10-CM | POA: Insufficient documentation

## 2019-06-26 DIAGNOSIS — Z1623 Resistance to quinolones and fluoroquinolones: Secondary | ICD-10-CM | POA: Diagnosis not present

## 2019-06-26 DIAGNOSIS — E876 Hypokalemia: Secondary | ICD-10-CM

## 2019-06-26 DIAGNOSIS — K219 Gastro-esophageal reflux disease without esophagitis: Secondary | ICD-10-CM | POA: Insufficient documentation

## 2019-06-26 DIAGNOSIS — Z720 Tobacco use: Secondary | ICD-10-CM

## 2019-06-26 DIAGNOSIS — F419 Anxiety disorder, unspecified: Secondary | ICD-10-CM | POA: Diagnosis not present

## 2019-06-26 DIAGNOSIS — R410 Disorientation, unspecified: Secondary | ICD-10-CM

## 2019-06-26 DIAGNOSIS — G4733 Obstructive sleep apnea (adult) (pediatric): Secondary | ICD-10-CM | POA: Diagnosis not present

## 2019-06-26 DIAGNOSIS — Z9049 Acquired absence of other specified parts of digestive tract: Secondary | ICD-10-CM | POA: Insufficient documentation

## 2019-06-26 DIAGNOSIS — X58XXXA Exposure to other specified factors, initial encounter: Secondary | ICD-10-CM | POA: Insufficient documentation

## 2019-06-26 DIAGNOSIS — G629 Polyneuropathy, unspecified: Secondary | ICD-10-CM | POA: Insufficient documentation

## 2019-06-26 DIAGNOSIS — Z1619 Resistance to other specified beta lactam antibiotics: Secondary | ICD-10-CM | POA: Diagnosis not present

## 2019-06-26 DIAGNOSIS — R531 Weakness: Secondary | ICD-10-CM | POA: Insufficient documentation

## 2019-06-26 DIAGNOSIS — I10 Essential (primary) hypertension: Secondary | ICD-10-CM | POA: Insufficient documentation

## 2019-06-26 DIAGNOSIS — B9620 Unspecified Escherichia coli [E. coli] as the cause of diseases classified elsewhere: Secondary | ICD-10-CM | POA: Diagnosis not present

## 2019-06-26 DIAGNOSIS — J45909 Unspecified asthma, uncomplicated: Secondary | ICD-10-CM | POA: Diagnosis not present

## 2019-06-26 DIAGNOSIS — Z886 Allergy status to analgesic agent status: Secondary | ICD-10-CM | POA: Insufficient documentation

## 2019-06-26 DIAGNOSIS — G4730 Sleep apnea, unspecified: Secondary | ICD-10-CM | POA: Insufficient documentation

## 2019-06-26 DIAGNOSIS — S0219XA Other fracture of base of skull, initial encounter for closed fracture: Secondary | ICD-10-CM | POA: Insufficient documentation

## 2019-06-26 DIAGNOSIS — I700 Atherosclerosis of aorta: Secondary | ICD-10-CM | POA: Diagnosis not present

## 2019-06-26 DIAGNOSIS — N390 Urinary tract infection, site not specified: Secondary | ICD-10-CM | POA: Diagnosis not present

## 2019-06-26 DIAGNOSIS — Z881 Allergy status to other antibiotic agents status: Secondary | ICD-10-CM | POA: Insufficient documentation

## 2019-06-26 DIAGNOSIS — Z1389 Encounter for screening for other disorder: Secondary | ICD-10-CM

## 2019-06-26 DIAGNOSIS — Z791 Long term (current) use of non-steroidal anti-inflammatories (NSAID): Secondary | ICD-10-CM | POA: Insufficient documentation

## 2019-06-26 DIAGNOSIS — Z79899 Other long term (current) drug therapy: Secondary | ICD-10-CM | POA: Insufficient documentation

## 2019-06-26 DIAGNOSIS — R296 Repeated falls: Secondary | ICD-10-CM

## 2019-06-26 LAB — CBC WITH DIFFERENTIAL/PLATELET
Abs Immature Granulocytes: 0.03 10*3/uL (ref 0.00–0.07)
Basophils Absolute: 0 10*3/uL (ref 0.0–0.1)
Basophils Relative: 0 %
Eosinophils Absolute: 0.1 10*3/uL (ref 0.0–0.5)
Eosinophils Relative: 2 %
HCT: 38.6 % (ref 36.0–46.0)
Hemoglobin: 13.4 g/dL (ref 12.0–15.0)
Immature Granulocytes: 1 %
Lymphocytes Relative: 19 %
Lymphs Abs: 1 10*3/uL (ref 0.7–4.0)
MCH: 34.4 pg — ABNORMAL HIGH (ref 26.0–34.0)
MCHC: 34.7 g/dL (ref 30.0–36.0)
MCV: 99 fL (ref 80.0–100.0)
Monocytes Absolute: 0.7 10*3/uL (ref 0.1–1.0)
Monocytes Relative: 13 %
Neutro Abs: 3.4 10*3/uL (ref 1.7–7.7)
Neutrophils Relative %: 65 %
Platelets: 292 10*3/uL (ref 150–400)
RBC: 3.9 MIL/uL (ref 3.87–5.11)
RDW: 12.4 % (ref 11.5–15.5)
WBC: 5.2 10*3/uL (ref 4.0–10.5)
nRBC: 0 % (ref 0.0–0.2)

## 2019-06-26 LAB — CBC
HCT: 34.7 % — ABNORMAL LOW (ref 36.0–46.0)
Hemoglobin: 12.3 g/dL (ref 12.0–15.0)
MCH: 35.1 pg — ABNORMAL HIGH (ref 26.0–34.0)
MCHC: 35.4 g/dL (ref 30.0–36.0)
MCV: 99.1 fL (ref 80.0–100.0)
Platelets: 278 10*3/uL (ref 150–400)
RBC: 3.5 MIL/uL — ABNORMAL LOW (ref 3.87–5.11)
RDW: 12.6 % (ref 11.5–15.5)
WBC: 5.9 10*3/uL (ref 4.0–10.5)
nRBC: 0 % (ref 0.0–0.2)

## 2019-06-26 LAB — COMPREHENSIVE METABOLIC PANEL
ALT: 27 U/L (ref 0–44)
ALT: 24 U/L (ref 0–44)
AST: 30 U/L (ref 15–41)
AST: 27 U/L (ref 15–41)
Albumin: 4 g/dL (ref 3.5–5.0)
Albumin: 3.6 g/dL (ref 3.5–5.0)
Alkaline Phosphatase: 63 U/L (ref 38–126)
Alkaline Phosphatase: 56 U/L (ref 38–126)
Anion gap: 13 (ref 5–15)
Anion gap: 13 (ref 5–15)
BUN: 28 mg/dL — ABNORMAL HIGH (ref 8–23)
BUN: 29 mg/dL — ABNORMAL HIGH (ref 8–23)
CO2: 24 mmol/L (ref 22–32)
CO2: 23 mmol/L (ref 22–32)
Calcium: 9.2 mg/dL (ref 8.9–10.3)
Calcium: 9 mg/dL (ref 8.9–10.3)
Chloride: 106 mmol/L (ref 98–111)
Chloride: 109 mmol/L (ref 98–111)
Creatinine, Ser: 0.96 mg/dL (ref 0.44–1.00)
Creatinine, Ser: 0.79 mg/dL (ref 0.44–1.00)
GFR calc Af Amer: >60 mL/min (ref >60)
GFR calc Af Amer: >60 mL/min (ref >60)
GFR calc non Af Amer: >60 mL/min (ref >60)
GFR calc non Af Amer: >60 mL/min (ref >60)
Glucose, Bld: 91 mg/dL (ref 70–99)
Glucose, Bld: 98 mg/dL (ref 70–99)
Potassium: 2.5 mmol/L — CL (ref 3.5–5.1)
Potassium: 3.8 mmol/L (ref 3.5–5.1)
Sodium: 143 mmol/L (ref 135–145)
Sodium: 145 mmol/L (ref 135–145)
Total Bilirubin: 1 mg/dL (ref 0.3–1.2)
Total Bilirubin: 0.7 mg/dL (ref 0.3–1.2)
Total Protein: 7.5 g/dL (ref 6.5–8.1)
Total Protein: 6.8 g/dL (ref 6.5–8.1)

## 2019-06-26 LAB — PHOSPHORUS: Phosphorus: 2.5 mg/dL (ref 2.5–4.6)

## 2019-06-26 LAB — HEMOGLOBIN A1C
Hgb A1c MFr Bld: 5.5 % (ref 4.8–5.6)
Mean Plasma Glucose: 111.15 mg/dL

## 2019-06-26 LAB — URINALYSIS, COMPLETE (UACMP) WITH MICROSCOPIC
Glucose, UA: NEGATIVE mg/dL
Ketones, ur: 80 mg/dL — AB
Nitrite: POSITIVE — AB
Protein, ur: >=300 mg/dL — AB
RBC / HPF: >50 RBC/hpf — ABNORMAL HIGH (ref 0–5)
Specific Gravity, Urine: 1.034 — ABNORMAL HIGH (ref 1.005–1.030)
WBC, UA: >50 WBC/hpf — ABNORMAL HIGH (ref 0–5)
pH: 5 (ref 5.0–8.0)

## 2019-06-26 LAB — TSH: TSH: 0.853 u[IU]/mL (ref 0.350–4.500)

## 2019-06-26 LAB — MAGNESIUM
Magnesium: 2 mg/dL (ref 1.7–2.4)
Magnesium: 2.1 mg/dL (ref 1.7–2.4)

## 2019-06-26 LAB — PHENYTOIN LEVEL, TOTAL: Phenytoin Lvl: 15.3 ug/mL (ref 10.0–20.0)

## 2019-06-26 LAB — MRSA PCR SCREENING: MRSA by PCR: NEGATIVE

## 2019-06-26 LAB — PROCALCITONIN: Procalcitonin: <0.1 ng/mL

## 2019-06-26 LAB — LACTIC ACID, PLASMA: Lactic Acid, Venous: 1.1 mmol/L (ref 0.5–1.9)

## 2019-06-26 MED ORDER — MAGNESIUM HYDROXIDE 400 MG/5ML PO SUSP: 30.0000 mL | Freq: Every day | ORAL | Status: DC | PRN

## 2019-06-26 MED ORDER — SODIUM CHLORIDE 0.9 % IV SOLN
1.0000 g | Freq: Once | INTRAVENOUS | Status: AC
  Administered 2019-06-26: 1 g via INTRAVENOUS
  Filled 2019-06-26: qty 10

## 2019-06-26 MED ORDER — LACOSAMIDE 50 MG PO TABS
150.0000 mg | ORAL_TABLET | Freq: Two times a day (BID) | ORAL | Status: DC
  Administered 2019-06-26 – 2019-06-27 (×2): 150 mg via ORAL
  Filled 2019-06-26 (×2): qty 3

## 2019-06-26 MED ORDER — MOMETASONE FURO-FORMOTEROL FUM 100-5 MCG/ACT IN AERO
2.0000 | INHALATION_SPRAY | Freq: Two times a day (BID) | RESPIRATORY_TRACT | Status: DC
  Administered 2019-06-26 – 2019-06-27 (×2): 2 via RESPIRATORY_TRACT
  Filled 2019-06-26: qty 8.8

## 2019-06-26 MED ORDER — ENOXAPARIN SODIUM 40 MG/0.4ML ~~LOC~~ SOLN
40.0000 mg | SUBCUTANEOUS | Status: DC
  Administered 2019-06-26: 40 mg via SUBCUTANEOUS
  Filled 2019-06-26: qty 0.4

## 2019-06-26 MED ORDER — BETAMETHASONE SOD PHOS & ACET 6 (3-3) MG/ML IJ SUSP: 3.0000 mg | Freq: Once | INTRAMUSCULAR | Status: DC

## 2019-06-26 MED ORDER — LAMOTRIGINE 100 MG PO TABS
400.0000 mg | ORAL_TABLET | ORAL | Status: DC
  Administered 2019-06-26: 400 mg via ORAL
  Filled 2019-06-26 (×2): qty 4

## 2019-06-26 MED ORDER — SUCRALFATE 1 G PO TABS
1.0000 g | ORAL_TABLET | Freq: Four times a day (QID) | ORAL | Status: DC
  Administered 2019-06-26 – 2019-06-27 (×4): 1 g via ORAL
  Filled 2019-06-26 (×4): qty 1

## 2019-06-26 MED ORDER — DULOXETINE HCL 30 MG PO CPEP
60.0000 mg | ORAL_CAPSULE | Freq: Every day | ORAL | Status: DC
  Administered 2019-06-27: 60 mg via ORAL
  Filled 2019-06-26: qty 2

## 2019-06-26 MED ORDER — ALBUTEROL SULFATE (2.5 MG/3ML) 0.083% IN NEBU: 2.5000 mg | INHALATION_SOLUTION | RESPIRATORY_TRACT | Status: DC | PRN

## 2019-06-26 MED ORDER — CLOPIDOGREL BISULFATE 75 MG PO TABS
75.0000 mg | ORAL_TABLET | Freq: Every day | ORAL | Status: DC
  Administered 2019-06-27: 75 mg via ORAL
  Filled 2019-06-26: qty 1

## 2019-06-26 MED ORDER — POTASSIUM CHLORIDE CRYS ER 20 MEQ PO TBCR
60.0000 meq | EXTENDED_RELEASE_TABLET | Freq: Once | ORAL | Status: AC
  Administered 2019-06-26: 60 meq via ORAL
  Filled 2019-06-26: qty 3

## 2019-06-26 MED ORDER — POTASSIUM CHLORIDE CRYS ER 20 MEQ PO TBCR
60.0000 meq | EXTENDED_RELEASE_TABLET | Freq: Once | ORAL | Status: AC
Start: 2019-06-26 — End: 2019-06-26
  Administered 2019-06-26: 60 meq via ORAL
  Filled 2019-06-26: qty 3

## 2019-06-26 MED ORDER — PHENYTOIN SODIUM EXTENDED 100 MG PO CAPS
100.0000 mg | ORAL_CAPSULE | Freq: Two times a day (BID) | ORAL | Status: DC
  Administered 2019-06-26 – 2019-06-27 (×2): 100 mg via ORAL
  Filled 2019-06-26 (×4): qty 1

## 2019-06-26 MED ORDER — SODIUM CHLORIDE 0.9 % IV SOLN
1.0000 g | INTRAVENOUS | Status: DC
  Administered 2019-06-27: 1 g via INTRAVENOUS
  Filled 2019-06-26: qty 1
  Filled 2019-06-26: qty 10

## 2019-06-26 MED ORDER — QUETIAPINE FUMARATE 25 MG PO TABS
25.0000 mg | ORAL_TABLET | Freq: Every day | ORAL | Status: DC
  Administered 2019-06-26: 25 mg via ORAL
  Filled 2019-06-26: qty 1

## 2019-06-26 MED ORDER — ALUM & MAG HYDROXIDE-SIMETH 200-200-20 MG/5ML PO SUSP: 30.0000 mL | Freq: Four times a day (QID) | ORAL | Status: DC | PRN

## 2019-06-26 MED ORDER — BETAMETHASONE SOD PHOS & ACET 6 (3-3) MG/ML IJ SUSP: 12.0000 mg | Freq: Once | INTRAMUSCULAR | Status: DC

## 2019-06-26 MED ORDER — LAMOTRIGINE 100 MG PO TABS
200.0000 mg | ORAL_TABLET | ORAL | Status: DC
  Administered 2019-06-27: 200 mg via ORAL
  Filled 2019-06-26: qty 2

## 2019-06-26 MED ORDER — PANTOPRAZOLE SODIUM 40 MG PO TBEC
40.0000 mg | DELAYED_RELEASE_TABLET | Freq: Every day | ORAL | Status: DC
  Administered 2019-06-27: 40 mg via ORAL
  Filled 2019-06-26: qty 1

## 2019-06-26 MED ORDER — ATORVASTATIN CALCIUM 10 MG PO TABS
10.0000 mg | ORAL_TABLET | Freq: Every day | ORAL | Status: DC
  Administered 2019-06-26: 10 mg via ORAL
  Filled 2019-06-26: qty 1

## 2019-06-26 MED ORDER — LEVETIRACETAM 500 MG PO TABS
500.0000 mg | ORAL_TABLET | Freq: Every day | ORAL | Status: DC
  Administered 2019-06-27: 500 mg via ORAL
  Filled 2019-06-26: qty 1

## 2019-06-26 MED ORDER — CLONAZEPAM 0.5 MG PO TABS
0.5000 mg | ORAL_TABLET | Freq: Three times a day (TID) | ORAL | Status: DC
  Administered 2019-06-26: 0.5 mg via ORAL
  Filled 2019-06-26: qty 1

## 2019-06-26 MED ORDER — FERROUS SULFATE 325 (65 FE) MG PO TABS
325.0000 mg | ORAL_TABLET | Freq: Three times a day (TID) | ORAL | Status: DC
  Administered 2019-06-27 (×2): 325 mg via ORAL
  Filled 2019-06-26 (×2): qty 1

## 2019-06-26 MED ORDER — VITAMIN B-12 1000 MCG PO TABS
500.0000 ug | ORAL_TABLET | Freq: Every day | ORAL | Status: DC
  Administered 2019-06-26 – 2019-06-27 (×2): 500 ug via ORAL
  Filled 2019-06-26 (×2): qty 1

## 2019-06-26 NOTE — ED Notes (Signed)
Pt asleep in the bed at this time.

## 2019-06-26 NOTE — ED Notes (Signed)
Patient transported to CT 

## 2019-06-26 NOTE — ED Notes (Signed)
Attempted to call report to floor 

## 2019-06-26 NOTE — ED Notes (Signed)
Found pt out of the bed and off the monitor. Pt had removed gown and pulled out IV. Got pt back in bed and put on BP and pulse ox.

## 2019-06-26 NOTE — ED Notes (Signed)
This tech in room with pt as 1:1 sitter. Pt giggling when spoken to. Pt able to follow simple commands and assist with rolling to doth dirty clothes. Legs were scrubbed with bath cloth, cleansing solution and warm water. Perineum cleaned with same. Clean brief put on pt and clean chuck's pad put under patient.

## 2019-06-26 NOTE — ED Notes (Addendum)
In and out cath performed on pt, pt tol well, peri care provided, pt not wearing any underwear under her pants, pt has dried stool noted to her perineum, pt cleaned prior to in and out cath, stool noted inside pt's vaginal cavity as well over the opening of the urethra, areas cleaned prior to in and out cath clean depends placed on pt

## 2019-06-26 NOTE — H&P (Addendum)
History and Physical    SAKYRA PARIS [id number] DOB: Aug 15, 1955 DOA: 06/26/2019    Referring MD/NP/PA: Dr.Monk PCP: [facility]  Patient coming from: [facility] Complaint: AMS.  HPI: Marcia Brown is a 64 y.o. female with medical history significant of Covid-19 2 weeks ago /CVA/COPD/ seizures seen in ed for AMS. Per EDMD pt is not at her baseline and is confused.pt wa found to have UTI in ed for hypokalemia.   ED Course: Pt in ed is A/A but confused and aphasic and perseverates to answer and then says an unrelated statement or word. No family at bedside.   Review of Systems:  Unable to obtain pt is dysarthric.   Past Medical History:  Diagnosis Date  . Anxiety   . Asthma   . Cerebral ischemia   . COPD (chronic obstructive pulmonary disease) ([place])   . Depression   . High cholesterol   . Polyneuropathy   . Seizures ([place])   . Sleep apnea     Past Surgical History:  Procedure Laterality Date  . ESOPHAGEAL DILATION    . FOOT SURGERY Right      reports that she has been smoking cigarettes. She has been smoking about 0.50 packs per day. She has never used smokeless tobacco. She reports that she does not drink alcohol or use drugs.  Allergies  Allergen Reactions  . Ciprofloxacin Other (See Comments)    Other Reaction: Lowered seizure threshold  . Aspirin Other (See Comments)    Reaction: Unknown    Family History  Problem Relation Age of Onset  . Breast cancer Daughter 53    Prior to Admission medications   Medication Sig Start Date End Date Taking? Authorizing Provider  acetaminophen (TYLENOL) 325 MG tablet Take 650 mg by mouth every 4 (four) hours as needed for pain.   Yes [provider]  atorvastatin (LIPITOR) 10 MG tablet Take 10 mg by mouth at bedtime.    Yes [provider]  celecoxib (CELEBREX) 100 MG capsule Take 100 mg by mouth 2 (two) times daily. 06/19/19  Yes [provider]  cholecalciferol  (VITAMIN D) 1000 units tablet Take 2,000 Units by mouth daily. Give with Vitamin D3 400 units   Yes [provider]  cholecalciferol (VITAMIN D) 400 UNITS TABS tablet Take 400 Units by mouth daily. Give with Vitamin D3 2000 units   Yes [provider]  clonazePAM (KLONOPIN) 0.5 MG tablet Take 0.5 mg by mouth 3 (three) times daily.    Yes [provider]  clopidogrel (PLAVIX) 75 MG tablet Take 75 mg by mouth daily. 06/19/19  Yes [provider]  cyanocobalamin 500 MCG tablet Take 500 mcg by mouth daily.   Yes [provider]  DULoxetine (CYMBALTA) 60 MG capsule Take 60 mg by mouth daily.   Yes [provider]  ferrous sulfate 325 (65 FE) MG tablet Take 325 mg by mouth 3 (three) times daily with meals.   Yes [provider]  gabapentin (NEURONTIN) 100 MG capsule Take 200 mg by mouth 2 (two) times daily.   Yes [provider]  lamoTRIgine (LAMICTAL) 200 MG tablet Take 200-400 mg by mouth 2 (two) times daily. Pt takes 2 tablets (400 mg) in the morning on Monday, Wednesday and Friday and only 1 tablet (200 mg) bid on all other days in the morning.   Yes [provider]  levETIRAcetam (KEPPRA) 500 MG tablet Take 500-750 mg by mouth 2 (two)  times daily. Take one tablet (500) by mouth daily in the morning and one and one-half tablets (750 mg) in the evening 06/19/19  Yes [provider]  losartan (COZAAR) 25 MG tablet Take 25 mg by mouth daily. 06/19/19  Yes [provider]  mometasone-formoterol (DULERA) 100-5 MCG/ACT AERO Inhale 2 puffs into the lungs 2 (two) times daily.   Yes [provider]  pantoprazole (PROTONIX) 40 MG tablet Take 40 mg by mouth daily before breakfast.    Yes [provider]  phenytoin (DILANTIN) 100 MG ER capsule Take 100 mg by mouth 2 (two) times daily.    Yes [provider]  QUEtiapine (SEROQUEL) 25 MG tablet Take 25 mg by mouth at bedtime. 06/19/19  Yes [provider]  sucralfate (CARAFATE) 1 g tablet Take 1 g by mouth 4 (four) times daily. 06/19/19  Yes [provider]  VIMPAT 150 MG TABS Take 1 tablet by mouth 2 (two) times daily. 06/03/19  Yes [provider]  albuterol (PROVENTIL HFA;VENTOLIN HFA) 108 (90 BASE) MCG/ACT inhaler Inhale 2 puffs into the lungs every 4 (four) hours as needed for wheezing or shortness of breath.    [provider]  alum & mag hydroxide-simeth (ANTACID LIQUID) 200-200-20 MG/5ML suspension Take 30 mLs by mouth every 6 (six) hours as needed for indigestion or heartburn.    [provider]  diclofenac sodium (VOLTAREN) 1 % GEL Apply 4 g topically 3 (three) times daily. Apply to both knees    [provider]  diphenhydrAMINE (BENADRYL) 25 MG tablet Take 25 mg by mouth every 6 (six) hours as needed for allergies.    [provider]  loperamide (IMODIUM) 2 MG capsule Take 4 mg by mouth as needed for diarrhea or loose stools.    [provider]  magnesium hydroxide (MILK OF MAGNESIA) 400 MG/5ML suspension Take 30 mLs by mouth daily as needed for mild constipation or moderate constipation.    [provider]  traMADol-acetaminophen (ULTRACET) 37.5-325 MG tablet Take 1 tablet by mouth every 4 (four) hours as needed. 05/29/19   [provider]    Physical Exam: Vitals:   06/26/19 0950 06/26/19 0951 06/26/19 1230  BP: (!) 154/72  139/70  Pulse: 84  75  Resp: 18  17  Temp: 99.9 F (37.7 C)    TempSrc: Oral    SpO2: 95%  95%  Weight:  90.7 kg   Height:  5\' 5"  (1.651 m)       Constitutional: NAD, calm, comfortable Vitals:   06/26/19 0950 06/26/19 0951 06/26/19 1230  BP: (!) 154/72  139/70  Pulse: 84  75  Resp: 18  17  Temp: 99.9 F (37.7 C)    TempSrc: Oral    SpO2: 95%  95%  Weight:  90.7 kg   Height:  5\' 5"  (1.651 m)    General: Left sided facial droop at rt basleine from prior cva. Eyes: PERRL, lids and conjunctivae  normal ENMT: Mucous membranes are moist. Posterior pharynx clear of any exudate or lesions.Normal dentition.  Neck: normal, supple, no masses, no thyromegaly Respiratory: clear to auscultation bilaterally, no wheezing, no crackles. Normal respiratory effort. No accessory muscle use.  Cardiovascular: Regular rate and rhythm, no murmurs / rubs / gallops. No extremity edema. 2+ pedal pulses. No carotid bruits.  Abdomen: no tenderness, no masses palpated. No hepatosplenomegaly. Bowel sounds positive.  Musculoskeletal: no clubbing / cyanosis. No joint deformity upper and lower extremities. Good ROM, no contractures.  Normal muscle tone.  Skin: no rashes, lesions, ulcers. No induration Neurologic: CN 2-12 grossly intact. Sensation intact, DTR normal. Strength 5/5 in all 4.  Psychiatric: Normal judgment and insight. Alert and oriented x 3. Normal mood.   Labs on Admission: I have personally reviewed following labs and imaging studies  CBC: Recent Labs  Lab 06/26/19 0955  WBC 5.2  NEUTROABS 3.4  HGB 13.4  HCT 38.6  MCV 99.0  PLT 123456   Basic Metabolic Panel: Recent Labs  Lab 06/26/19 0955  NA 143  K 2.5*  CL 106  CO2 24  GLUCOSE 91  BUN 28*  CREATININE 0.96  CALCIUM 9.2  MG 2.0   GFR: Estimated Creatinine Clearance: 65 mL/min (by C-G formula based on SCr of 0.96 mg/dL). Liver Function Tests: Recent Labs  Lab 06/26/19 0955  AST 30  ALT 27  ALKPHOS 63  BILITOT 1.0  PROT 7.5  ALBUMIN 4.0   No results for input(s): LIPASE, AMYLASE in the last 168 hours. No results for input(s): AMMONIA in the last 168 hours. Coagulation Profile: No results for input(s): INR, PROTIME in the last 168 hours. Cardiac Enzymes: No results for input(s): CKTOTAL, CKMB, CKMBINDEX, TROPONINI in the last 168 hours. BNP (last 3 results) No results for input(s): PROBNP in the last 8760 hours. HbA1C: No results for input(s): HGBA1C in the last 72 hours. CBG: No results for input(s): GLUCAP in the  last 168 hours. Lipid Profile: No results for input(s): CHOL, HDL, LDLCALC, TRIG, CHOLHDL, LDLDIRECT in the last 72 hours. Thyroid Function Tests: No results for input(s): TSH, T4TOTAL, FREET4, T3FREE, THYROIDAB in the last 72 hours. Anemia Panel: No results for input(s): VITAMINB12, FOLATE, FERRITIN, TIBC, IRON, RETICCTPCT in the last 72 hours. Urine analysis:    Component Value Date/Time   COLORURINE AMBER (A) 06/26/2019 1120   APPEARANCEUR CLOUDY (A) 06/26/2019 1120   APPEARANCEUR Clear 03/18/2014 1912   LABSPEC 1.034 (H) 06/26/2019 1120   LABSPEC 1.004 03/18/2014 1912   PHURINE 5.0 06/26/2019 1120   GLUCOSEU NEGATIVE 06/26/2019 1120   GLUCOSEU Negative 03/18/2014 1912   HGBUR MODERATE (A) 06/26/2019 1120   BILIRUBINUR SMALL (A) 06/26/2019 1120   BILIRUBINUR Negative 03/18/2014 1912   KETONESUR 80 (A) 06/26/2019 1120   PROTEINUR >=300 (A) 06/26/2019 1120   UROBILINOGEN 0.2 11/06/2010 0900   NITRITE POSITIVE (A) 06/26/2019 1120   LEUKOCYTESUR SMALL (A) 06/26/2019 1120   LEUKOCYTESUR Negative 03/18/2014 1912    Radiological Exams on Admission: CT HEAD WO CONTRAST  Result Date: 06/26/2019 CLINICAL DATA:  Altered mental status, confusion EXAM: CT HEAD WITHOUT CONTRAST TECHNIQUE: Contiguous axial images were obtained from the base of the skull through the vertex without intravenous contrast. COMPARISON:  05/30/2019 FINDINGS: Brain: No evidence of acute infarction, hemorrhage, hydrocephalus, extra-axial collection or mass lesion/mass effect. Right temporal lobe encephalomalacia is unchanged. Mild cerebral volume loss, stable from prior. Vascular: Mild atherosclerotic calcifications involving the large vessels of the skull base. No unexpected hyperdense vessel. Skull: Chronic right temporal bone fracture. Negative for acute fracture or focal lesion. Sinuses/Orbits: Air-fluid level within the left sphenoid sinus. Remaining paranasal sinuses are clear. Orbital structures intact. Other:  None. IMPRESSION: 1. No acute intracranial findings. 2. Age related cerebral volume loss, stable. Electronically Signed   By: Davina Poke D.O.   On: 06/26/2019 13:10   DG Chest Port 1 View  Result Date: 06/26/2019 CLINICAL DATA:  COVID-19 EXAM: PORTABLE CHEST 1 VIEW COMPARISON:  Portable exam 1016 hours compared to 03/27/2019 FINDINGS: Enlargement  of cardiac silhouette. Mediastinal contours and pulmonary vascularity normal. Atherosclerotic calcification aorta. Slightly decreased lung volumes versus previous study with minimal bibasilar atelectasis. No definite acute infiltrate, pleural effusion or pneumothorax. Bones demineralized. IMPRESSION: Enlargement of cardiac silhouette with minimal bibasilar atelectasis. No definite acute infiltrate. Electronically Signed   By: Lavonia Dana M.D.   On: 06/26/2019 10:49    EKG: Independently reviewed.  Sinus rhythm, ST depression in lead I.II ,, V2/V3/V4/V5. LAD, LVH.   Assessment/Plan Active Problems:   Seizure ([place])   Repeated falls   Tobacco abuse   Acute lower UTI   Hypokalemia Acute UTI/recurrent UTIs: Patient was given Rocephin in the emergency room will continue.  Current creatinine is within normal limits. Urine culture and sensitivity to tailor treatment plan.  History of CVA: Recurrent right-sided facial droop.  Altered mental status dysarthria and will evaluate.  Patient for TIA/CVA. 2D echo with bubble study,  MRI MRA of the brain. Patient to be kept n.p.o. until bedside swallow study. Continue patient on atorvastatin Plavix.  Hypokalemia: Patient given 20 mEq of potassium.  History of seizure disorder: She is currently on Lamictal, Keppra, phenytoin we will continue her on her current home regimen. Consider neurology consult.  Seizure precautions, aspiration precautions. D/C ULTRACET PT SHOULD NOT RECEIVE TRAMADOL OR ANY PRO EPILEPTIC MED'S.  Repeated falls, risk of falls.: Fall precautions, has been by physical therapy and  Occupational Therapy.   Hypertension: We will hold patient's home regimen of losartan to allow permissive hypertension.  DVT prophylaxis: Lovenox. Code Status: Full code  Family Communication: None at bedside  Disposition Plan: Discharge back to skilled nursing facility.  Consults called: None Admission status: Inpatient status with cardiac monitoring.     Para Skeans MD [facility] If 7PM-7AM, please contact night-coverage [email or web address] Password [facility]  06/26/2019, 1:37 PM

## 2019-06-26 NOTE — ED Triage Notes (Signed)
Pt arrives via ems from the [facility], ems reports that they were called do to pt acting more confused and as she walking down the hall she laid herself in the floor. Pt appears to be confused at this time, pt thinks she is at school, words are not matching, pt states "don't eat me to the dr upon assessment." pt states, " I like my baby and want to spend together, if you get what i'm saying."

## 2019-06-26 NOTE — ED Notes (Signed)
Turned TV on for pt.  

## 2019-06-26 NOTE — ED Provider Notes (Signed)
[facility] Emergency Department Provider Note  ____________________________________________   None    (approximate)  I have reviewed the triage vital signs and the nursing notes.  History  Chief Complaint Weakness and Altered Mental Status    HPI Marcia Brown is a 64 y.o. female with hx of COPD, TBI/chronic encephalomalacia, seizures, polyneuropathy who presents to the emergency department for altered mental status, confusion, generalized weakness.  Patient resides at the [facility].  Per EMS report, they were called out due to the patient acting more confused than normal and appearing weak.  She was walking down the hallway, and became so weak that she laid herself on the floor.  No falls or related trauma.  No reported seizure-like activity prior to this event.  Representative from the living facility states she thinks the patient might have had a seizure earlier this morning.  Per report, the patient has been seizing with increased frequency over the last several weeks.  Representative states the patient previously had seizures every few months or so, but now seems to have one every week for the past 2-3 weeks.  She has been taking her medications as prescribed.  No nausea, vomiting, diarrhea, or missed medications.  Of note, patient did test positive for COVID several weeks ago, but has not exhibited any respiratory distress, shortness of breath, cough, difficulty breathing.  Caveat: History limited due to patient's altered mental status on arrival, obtained primarily from EMS and representative from her living facility.   Past Medical Hx Past Medical History:  Diagnosis Date  . Anxiety   . Asthma   . Cerebral ischemia   . COPD (chronic obstructive pulmonary disease) ([place])   . Depression   . High cholesterol   . Polyneuropathy   . Seizures ([place])   . Sleep apnea     Problem List Patient Active Problem List   Diagnosis Date Noted  .  Seizure ([place]) 02/03/2015    Past Surgical Hx Past Surgical History:  Procedure Laterality Date  . ESOPHAGEAL DILATION    . FOOT SURGERY Right     Medications Prior to Admission medications   Medication Sig Start Date End Date Taking? Authorizing Provider  acetaminophen (MAPAP) 500 MG tablet Take 500 mg by mouth every 6 (six) hours as needed for mild pain or moderate pain.    [provider]  acetaminophen (TYLENOL) 325 MG tablet Take 325 mg by mouth every 4 (four) hours as needed for mild pain. Take 2 tablets (650 mg) by mouth every 4 hours daily as needed    [provider]  albuterol (PROVENTIL HFA;VENTOLIN HFA) 108 (90 BASE) MCG/ACT inhaler Inhale 2 puffs into the lungs every 4 (four) hours as needed for wheezing or shortness of breath.    [provider]  alum & mag hydroxide-simeth (ANTACID LIQUID) 200-200-20 MG/5ML suspension Take 30 mLs by mouth every 6 (six) hours as needed for indigestion or heartburn.    [provider]  atorvastatin (LIPITOR) 10 MG tablet Take 10 mg by mouth at bedtime.     [provider]  barrier cream (NON-SPECIFIED) CREA Apply 1 application topically as needed. Apply topically after toileting as needed to prevent skin breakdown    [provider]  cholecalciferol (VITAMIN D) 1000 units tablet Take 2,000 Units by mouth daily. Give with Vitamin D3 400 units    [provider]  cholecalciferol (VITAMIN D) 400 UNITS TABS tablet Take 400 Units by mouth daily. Give with  Vitamin D3 2000 units    [provider]  clonazePAM (KLONOPIN) 0.5 MG tablet Take 0.5 mg by mouth 3 (three) times daily.     [provider]  cyanocobalamin 500 MCG tablet Take 500 mcg by mouth daily.    [provider]  diclofenac sodium (VOLTAREN) 1 % GEL Apply 4 g topically 3 (three) times daily. Apply to both knees    [provider]  diphenhydrAMINE (BENADRYL) 25 MG tablet Take 25 mg by mouth every  6 (six) hours as needed for allergies.    [provider]  DULoxetine (CYMBALTA) 60 MG capsule Take 60 mg by mouth daily.    [provider]  ferrous sulfate 325 (65 FE) MG tablet Take 325 mg by mouth 3 (three) times daily with meals.    [provider]  gabapentin (NEURONTIN) 100 MG capsule Take 200 mg by mouth 2 (two) times daily.    [provider]  guaiFENesin (ROBITUSSIN) 100 MG/5ML SOLN Take 15 mLs by mouth every 4 (four) hours as needed for cough or to loosen phlegm.    [provider]  HYDROcodone-acetaminophen (NORCO) 5-325 MG tablet Take 1 tablet by mouth every 6 (six) hours as needed for up to 7 doses for severe pain. 05/31/18   Darel Hong, MD  lacosamide (VIMPAT) 50 MG TABS tablet Take 1 tablet (50 mg total) by mouth 2 (two) times daily. 07/08/18   Schaevitz, Randall An, MD  lacosamide 100 MG TABS Take 1 tablet (100 mg total) by mouth 2 (two) times daily. 02/04/15   Hillary Bow, MD  lamoTRIgine (LAMICTAL) 200 MG tablet Take 200-400 mg by mouth 2 (two) times daily. Pt takes 2 tablets (400 mg) in the morning on Monday, Wednesday and Friday and only 1 tablet (200 mg) bid on all other days in the morning.    [provider]  levETIRAcetam (KEPPRA) 1000 MG tablet Take 1 tablet (1,000 mg total) by mouth 2 (two) times daily. Patient taking differently: Take 1,500-2,000 mg by mouth daily. Take 2000 mg by mouth in the morning Tuesday, Thursday, Saturday and Sunday and 1500 mg by mouth every Monday, Wednesday and Friday morning. 11/26/15   Daymon Larsen, MD  loperamide (IMODIUM) 2 MG capsule Take 4 mg by mouth as needed for diarrhea or loose stools.    [provider]  magnesium hydroxide (MILK OF MAGNESIA) 400 MG/5ML suspension Take 30 mLs by mouth daily as needed for mild constipation or moderate constipation.    [provider]  meloxicam (MOBIC) 15 MG tablet Take 1 tablet (15 mg total) by mouth daily. 12/23/15    Cuthriell, Charline Bills, PA-C  mometasone-formoterol (DULERA) 100-5 MCG/ACT AERO Inhale 2 puffs into the lungs 2 (two) times daily.    [provider]  mupirocin ointment (BACTROBAN) 2 % Apply 1 application topically 2 (two) times daily. 10/30/16   Edrick Kins, DPM  Neomycin-Bacitracin-Polymyxin (TRIPLE ANTIBIOTIC) [phone] OINT Apply topically daily.    [provider]  NONFORMULARY OR COMPOUNDED ITEM Apply 1-2 g topically 4 (four) times daily. 04/03/16   Edrick Kins, DPM  ondansetron (ZOFRAN ODT) 4 MG disintegrating tablet Take 1 tablet (4 mg total) by mouth every 8 (eight) hours as needed for nausea or vomiting. 07/21/16   Earleen Newport, MD  pantoprazole (PROTONIX) 40 MG tablet Take 40 mg by mouth daily before breakfast.     [provider]  phenytoin (DILANTIN) 100 MG ER capsule Take 100 mg by mouth  2 (two) times daily.     [provider]  Powders (GOLD BOND BABY POWDER EX) Apply topically daily as needed. For dry itchy skin    [provider]  pyrithione zinc (HEAD AND SHOULDERS) 1 % shampoo Apply topically daily as needed for itching.    [provider]  traMADol (ULTRAM) 50 MG tablet Take 50 mg by mouth every 6 (six) hours as needed.    [provider]    Allergies Ciprofloxacin and Aspirin  Family Hx Family History  Problem Relation Age of Onset  . Breast cancer Daughter 70    Social Hx Social History   Tobacco Use  . Smoking status: Current Every Day Smoker    Packs/day: 0.50    Types: Cigarettes  . Smokeless tobacco: Never Used  Substance Use Topics  . Alcohol use: No  . Drug use: No     Review of Systems Unable to obtain due to patient's confusion.   Physical Exam  Vital Signs: ED Triage Vitals  Enc Vitals Group     BP 06/26/19 0950 (!) 154/72     Pulse Rate 06/26/19 0950 84     Resp 06/26/19 0950 18     Temp 06/26/19 0950 99.9 F (37.7 C)     Temp Source 06/26/19 0950 Oral      SpO2 06/26/19 0950 95 %     Weight 06/26/19 0951 200 lb (90.7 kg)     Height 06/26/19 0951 5\' 5"  (1.651 m)     Head Circumference --      Peak Flow --      Pain Score 06/26/19 0951 0     Pain Loc --      Pain Edu? --      Excl. in [place]? --      Constitutional: Awake.  Oriented to self, birthday.  Does not know the current month or her location. Nonsensical speaking - saying she "doesn't have a tongue, they gave it to [place]...don't eat me"  Head: Normocephalic. Question mild R facial nerve I weakness, suspect from her prior TBI. Eyes: Conjunctivae clear. Sclera anicteric. 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  Nose: No congestion. No rhinorrhea. Mouth/Throat: Wearing mask.  Neck: No stridor.   Cardiovascular: Normal rate, regular rhythm. Extremities well perfused. Respiratory: Normal respiratory effort.  Lungs CTAB. Gastrointestinal: Soft. Non-tender. Non-distended.  Musculoskeletal: No lower extremity edema. No deformities. Neurologic: Confused. Right-sided ptosis. Question R facial nerve weakness, suspect from her prior TBI.  Equal strength upper and lower extremities. Skin: Skin is warm, dry and intact. No rash noted. Psychiatric: Mood and affect are appropriate for situation.  EKG  Personally reviewed.   Rate: 82 Rhythm: sinus Axis: somewhat leftward Intervals: prolonged QT, 519 ms TWI in I and aVL, slight in II - seen prior No STEMI    Radiology  CXR: IMPRESSION: Enlargement of cardiac silhouette with minimal bibasilar atelectasis. No definite acute infiltrate.  CT head: IMPRESSION:  1. No acute intracranial findings.  2. Age related cerebral volume loss, stable.    Procedures  Procedure(s) performed (including critical care):  Procedures   Initial Impression / Assessment and Plan / ED Course  64 y.o. female with past medical history as above who presents to the ED for altered mental status.  Ddx:  post ictal, metabolic or infectious encephalopathy such as pneumonia or UTI, electrolyte derangements  Will obtain labs, including her antiepileptic levels, imaging, reassess.  Urine is overwhelmingly positive for infection, likely etiology of her symptoms.  Will treat with antibiotics.  CT head negative.  Lactic and procalcitonin normal, therefore less likely to be related to seizure or postictal state. Also noted to be hypokalemic on her chemistry, normal magnesium.  Will replete.    Will plan to admit for continued treatment of her UTI and monitoring of her mental status back to baseline, as she does still remain confused.  Discussed with hospitalist for admission.   Final Clinical Impression(s) / ED Diagnosis  Final diagnoses:  Confusion  Urinary tract infection in female       Note:  This document was prepared using Dragon voice recognition software and may include unintentional dictation errors.   Lilia Pro., MD 06/26/19 [phone]

## 2019-06-27 ENCOUNTER — Inpatient Hospital Stay: Payer: Medicare Other

## 2019-06-27 DIAGNOSIS — R4182 Altered mental status, unspecified: Secondary | ICD-10-CM

## 2019-06-27 LAB — COMPREHENSIVE METABOLIC PANEL
ALT: 23 U/L (ref 0–44)
AST: 28 U/L (ref 15–41)
Albumin: 3.4 g/dL — ABNORMAL LOW (ref 3.5–5.0)
Alkaline Phosphatase: 53 U/L (ref 38–126)
Anion gap: 12 (ref 5–15)
BUN: 28 mg/dL — ABNORMAL HIGH (ref 8–23)
CO2: 24 mmol/L (ref 22–32)
Calcium: 9.1 mg/dL (ref 8.9–10.3)
Chloride: 106 mmol/L (ref 98–111)
Creatinine, Ser: 0.76 mg/dL (ref 0.44–1.00)
GFR calc Af Amer: >60 mL/min (ref >60)
GFR calc non Af Amer: >60 mL/min (ref >60)
Glucose, Bld: 83 mg/dL (ref 70–99)
Potassium: 3.7 mmol/L (ref 3.5–5.1)
Sodium: 142 mmol/L (ref 135–145)
Total Bilirubin: 0.7 mg/dL (ref 0.3–1.2)
Total Protein: 6.6 g/dL (ref 6.5–8.1)

## 2019-06-27 LAB — CBC
HCT: 35.8 % — ABNORMAL LOW (ref 36.0–46.0)
Hemoglobin: 12.4 g/dL (ref 12.0–15.0)
MCH: 34.6 pg — ABNORMAL HIGH (ref 26.0–34.0)
MCHC: 34.6 g/dL (ref 30.0–36.0)
MCV: 100 fL (ref 80.0–100.0)
Platelets: 257 10*3/uL (ref 150–400)
RBC: 3.58 MIL/uL — ABNORMAL LOW (ref 3.87–5.11)
RDW: 12.4 % (ref 11.5–15.5)
WBC: 4.5 10*3/uL (ref 4.0–10.5)
nRBC: 0 % (ref 0.0–0.2)

## 2019-06-27 LAB — HIV ANTIBODY (ROUTINE TESTING W REFLEX): HIV Screen 4th Generation wRfx: NONREACTIVE

## 2019-06-27 MED ORDER — CLONAZEPAM 0.5 MG PO TABS
0.5000 mg | ORAL_TABLET | Freq: Every day | ORAL | Status: DC
  Administered 2019-06-27: 0.5 mg via ORAL
  Filled 2019-06-27: qty 1

## 2019-06-27 MED ORDER — GABAPENTIN 100 MG PO CAPS
200.0000 mg | ORAL_CAPSULE | Freq: Two times a day (BID) | ORAL | Status: DC
  Administered 2019-06-27: 200 mg via ORAL
  Filled 2019-06-27: qty 2

## 2019-06-27 MED ORDER — CEPHALEXIN 500 MG PO CAPS: 500.0000 mg | ORAL_CAPSULE | Freq: Three times a day (TID) | ORAL | 0 refills | Status: AC

## 2019-06-27 MED ORDER — POTASSIUM CHLORIDE IN NACL 20-0.9 MEQ/L-% IV SOLN: INTRAVENOUS | Status: DC

## 2019-06-27 MED ORDER — POTASSIUM CHLORIDE IN NACL 20-0.9 MEQ/L-% IV SOLN
INTRAVENOUS | Status: DC
  Filled 2019-06-27 (×2): qty 1000

## 2019-06-27 NOTE — Progress Notes (Signed)
EMS is here to pick up patient, will discontinue PIV and telemetry monitor.

## 2019-06-27 NOTE — Care Management CC44 (Signed)
Condition Code 44 Documentation Completed  Patient Details  Name: Marcia Brown MRN: [id number] Date of Birth: [birth year]   Condition Code 44 given:  Yes Patient signature on Condition Code 44 notice:  Yes Documentation of 2 MD's agreement:  Yes Code 44 added to claim:  Yes    Ross Ludwig, LCSW 06/27/2019, 3:22 PM

## 2019-06-27 NOTE — Evaluation (Signed)
Occupational Therapy Evaluation Patient Details Name: Marcia Brown MRN: [id number] DOB: 1955/11/07 Today's Date: 06/27/2019    History of Present Illness Pt is 64 y/o F with PMH: CVA, COPD, seizures, and COVID-19 (2 weeks ago). Pt presented with altered mental status, found to have UTI, and was hypokalemic.   Clinical Impression   Pt was seen for OT evaluation this date. Prior to hospital admission, pt was Indep with ADLs with some IADLs provided at her facility-meals and med mgt. Pt is poor historian so living arrangement information gleaned from MD note-pt lives in [facility]. Pt does endorse using RW for fxl mobility and also reports some falls. Currently pt demonstrates impairments as described below (See OT problem list) which functionally limit her ability to perform ADL/self-care tasks. Pt currently requires CGA with RW with standing ADLs including grooming and standing clothing mgt over hips, CGA for ADL transfers, and Supv for dynamic sitting ADLs such as threading socks.  Pt would benefit from skilled OT to address noted impairments and functional limitations (see below for any additional details) in order to maximize safety and independence while minimizing falls risk and caregiver burden.  Upon hospital discharge, recommend pt discharge to her ALF with HHOT and with increased supv for OOB activity at least on initial d/c for pt safety.    Follow Up Recommendations  Home health OT;Supervision/Assistance - 24 hour(supv for all OOB at least on initial d/c from acute setting.)    Equipment Recommendations  3 in 1 bedside commode    Recommendations for Other Services       Precautions / Restrictions Precautions Precautions: Fall;Other (comment)(seizure) Restrictions Weight Bearing Restrictions: No      Mobility Bed Mobility Overal bed mobility: Needs Assistance Bed Mobility: Supine to Sit     Supine to sit: Min guard        Transfers Overall  transfer level: Needs assistance Equipment used: Rolling walker (2 wheeled) Transfers: Sit to/from [facility] Sit to Stand: Min guard Stand pivot transfers: Min guard            Balance Overall balance assessment: Needs assistance Sitting-balance support: No upper extremity supported Sitting balance-Leahy Scale: Good Sitting balance - Comments: G static, F+ dynamic sitting     Standing balance-Leahy Scale: Fair Standing balance comment: requires CGA and RW for standing support.                           ADL either performed or assessed with clinical judgement   ADL Overall ADL's : Needs assistance/impaired                                       General ADL Comments: Pt requires CGA for standing ADLs such as standing grooming sink-side to wash hands. Pt requires CGA for ADL transfers to/from commode/bed/chair. Pt able to thread socks seated with Supv for dynamic reaching-slightly off balance (G-/F+ dynamic sitting)     Vision   Additional Comments: difficult to formally assess d/t pt's confusion, potential double vision, pt states "I have two phones" when looking at her one, white, room phone. Pt appears to track appropriately for fxl tasks assessed.     Perception     Praxis      Pertinent Vitals/Pain Pain Assessment: No/denies pain     Hand Dominance     Extremity/Trunk  Assessment Upper Extremity Assessment Upper Extremity Assessment: Generalized weakness;RUE deficits/detail;LUE deficits/detail RUE Deficits / Details: shld, elbow, grip 4-/5 MMT LUE Deficits / Details: shld, elbow, grip 4-/5 MMT   Lower Extremity Assessment Lower Extremity Assessment: Defer to PT evaluation;Generalized weakness       Communication Communication Communication: (appears HOH)   Cognition Arousal/Alertness: Awake/alert Behavior During Therapy: WFL for tasks assessed/performed Overall Cognitive Status: Impaired/Different from  baseline Area of Impairment: Orientation;Attention;Memory;Following commands;Safety/judgement;Problem solving                 Orientation Level: Disoriented to;Place;Time;Situation Current Attention Level: Sustained Memory: Decreased short-term memory Following Commands: Follows one step commands consistently;Follows multi-step commands inconsistently;Follows multi-step commands with increased time Safety/Judgement: Decreased awareness of safety;Decreased awareness of deficits   Problem Solving: Slow processing;Requires verbal cues;Requires tactile cues     General Comments       Exercises Other Exercises Other Exercises: OT facilitates education re: use of call bell, fall prevention and OT notifies pt of chair alarm in place, pt demos moderate reception of education provided. Other Exercises: OT facilitates education re: safe use of RW including safe hand placement: reach back to sit, push up from bed. Pt demos moderate reception of education.   Shoulder Instructions      Home Living Family/patient expects to be discharged to:: Assisted living([facility])                             Home Equipment: Walker - 2 wheels   Additional Comments: pt is poor historian, does endorse needing a walker      Prior Functioning/Environment                   OT Problem List: Decreased strength;Decreased activity tolerance;Impaired balance (sitting and/or standing);Decreased cognition;Decreased safety awareness;Decreased knowledge of use of DME or AE      OT Treatment/Interventions: Self-care/ADL training;Therapeutic exercise;Energy conservation;DME and/or AE instruction;Therapeutic activities;Patient/family education;Balance training    OT Goals(Current goals can be found in the care plan section) Acute Rehab OT Goals Patient Stated Goal: to move around better and fall less OT Goal Formulation: With patient Time For Goal Achievement: 07/11/19 Potential to  Achieve Goals: Good  OT Frequency: Min 1X/week   Barriers to D/C:            Co-evaluation              AM-PAC OT "6 Clicks" Daily Activity     Outcome Measure Help from another person eating meals?: None Help from another person taking care of personal grooming?: A Little Help from another person toileting, which includes using toliet, bedpan, or urinal?: A Little Help from another person bathing (including washing, rinsing, drying)?: A Little Help from another person to put on and taking off regular upper body clothing?: None Help from another person to put on and taking off regular lower body clothing?: A Little 6 Click Score: 20   End of Session Equipment Utilized During Treatment: Gait belt;Rolling walker Nurse Communication: Mobility status;Other (comment)(notified RN and CNA that pt's purewick was soiled. Pt on sz precautions, but currently up to chair with chair alarm in part d/t bedding soiled and removed. Both parties verbalize understanding.)  Activity Tolerance:   Patient left: in chair;with call bell/phone within reach;with chair alarm set  OT Visit Diagnosis: Unsteadiness on feet (R26.81);Muscle weakness (generalized) (M62.81)  Time: [id number] OT Time Calculation (min): 53 min Charges:  OT General Charges $OT Visit: 1 Visit OT Evaluation $OT Eval Moderate Complexity: 1 Mod OT Treatments $Self Care/Home Management : 23-37 mins $Therapeutic Activity: 8-22 mins  Gerrianne Scale, MS, OTR/L ascom [phone] 06/27/19, 11:04 AM

## 2019-06-27 NOTE — Progress Notes (Signed)
Called report to [facility] and talked to [place]. Called EMS and awaiting for their arrival.

## 2019-06-27 NOTE — Care Management Obs Status (Signed)
[facility] NOTIFICATION   Patient Details  Name: Marcia Brown MRN: [id number] Date of Birth: Oct 14, 1955   Medicare Observation Status Notification Given:  Yes    Ross Ludwig, LCSW 06/27/2019, 3:22 PM

## 2019-06-27 NOTE — Progress Notes (Signed)
Physical Therapy Evaluation Patient Details Name: Marcia Brown MRN: [id number] DOB: 1956/02/17 Today's Date: 06/27/2019   History of Present Illness  Marcia Brown is a 64 y.o. female with medical history significant of Covid-19 2 weeks ago /CVA/COPD/ seizures seen in ed for AMS. Per EDMD pt is not at her baseline and is confused.pt wa found to have UTI in ed for hypokalemia.   Clinical Impression  Patient is able to follow commands but needs 1 step commands to complete tasks. She is able to perform supine <> sit bed mobility with min assist, transfers sit to stand with min assist and HHA ambulation 10 feet with O2 saturation 90%. She is able to complete standing marching and seated marching without O2 saturation decreasing below 90%. She has fair strength BLE and has some difficulty getting her LE's back into the bed from seated position. She has good sitting and standing balance and needs UE to assist for seated dynamic balance activities. She will benefit from skilled PT to improve balance, strength and mobility.     Follow Up Recommendations SNF    Equipment Recommendations  Rolling walker with 5" wheels    Recommendations for Other Services       Precautions / Restrictions Restrictions Weight Bearing Restrictions: No      Mobility  Bed Mobility Overal bed mobility: Needs Assistance Bed Mobility: Supine to Sit;Sit to Supine     Supine to sit: Min guard Sit to supine: Min assist   General bed mobility comments: Patient needs VC for sequencing  Transfers Overall transfer level: Needs assistance Equipment used: Rolling walker (2 wheeled)   Sit to Stand: Min assist         General transfer comment: needs VC for safety  Ambulation/Gait Ambulation/Gait assistance: Min guard Gait Distance (Feet): 10 Feet Assistive device: 1 person hand held assist       General Gait Details: (slow gait speed)  Stairs            Wheelchair Mobility    Modified Rankin  (Stroke Patients Only)       Balance Overall balance assessment: Needs assistance Sitting-balance support: No upper extremity supported Sitting balance-Leahy Scale: Good Sitting balance - Comments: G static, F+ dynamic sitting     Standing balance-Leahy Scale: Fair Standing balance comment: requires CGA and RW for standing support.                             Pertinent Vitals/Pain Pain Assessment: No/denies pain    Home Living Family/patient expects to be discharged to:: Skilled nursing facility                      Prior Function Level of Independence: Independent with assistive device(s)               Hand Dominance        Extremity/Trunk Assessment                Communication   Communication: No difficulties;HOH  Cognition Arousal/Alertness: Awake/alert Behavior During Therapy: WFL for tasks assessed/performed Overall Cognitive Status: Within Functional Limits for tasks assessed                                        General Comments      Exercises     Assessment/Plan  PT Assessment    PT Problem List         PT Treatment Interventions      PT Goals (Current goals can be found in the Care Plan section)  Acute Rehab PT Goals Patient Stated Goal: to walk PT Goal Formulation: Patient unable to participate in goal setting Time For Goal Achievement: 07/11/19 Potential to Achieve Goals: Good    Frequency     Barriers to discharge        Co-evaluation               AM-PAC PT "6 Clicks" Mobility  Outcome Measure Help needed turning from your back to your side while in a flat bed without using bedrails?: A Little Help needed moving from lying on your back to sitting on the side of a flat bed without using bedrails?: A Little Help needed moving to and from a bed to a chair (including a wheelchair)?: A Lot Help needed standing up from a chair using your arms (e.g., wheelchair or bedside  chair)?: A Lot Help needed to walk in hospital room?: A Little Help needed climbing 3-5 steps with a railing? : A Lot 6 Click Score: 15    End of Session Equipment Utilized During Treatment: Gait belt Activity Tolerance: No increased pain;Patient limited by fatigue;Patient limited by lethargy          Time: 1320-1345 PT Time Calculation (min) (ACUTE ONLY): 25 min   Charges:   PT Evaluation $PT Eval Low Complexity: 1 Low PT Treatments $Therapeutic Activity: 8-22 mins          Alanson Puls, PT DPT 06/27/2019, 3:07 PM

## 2019-06-27 NOTE — Plan of Care (Signed)
  Problem: Health Behavior/Discharge Planning: Goal: Ability to manage health-related needs will improve Outcome: Progressing   Problem: Clinical Measurements: Goal: Will remain free from infection Outcome: Progressing Goal: Diagnostic test results will improve Outcome: Progressing Goal: Respiratory complications will improve Outcome: Progressing Goal: Cardiovascular complication will be avoided Outcome: Progressing   Problem: Activity: Goal: Risk for activity intolerance will decrease Outcome: Progressing

## 2019-06-27 NOTE — Discharge Summary (Signed)
Physician Discharge Summary  ARLYN YOKE [id number] DOB: March 22, 1956 DOA: 06/26/2019  PCP: Orvis Brill, [facility]  Admit date: 06/26/2019 Discharge date: 06/27/2019  Admitted From: ALF  Disposition:  ALF   Recommendations for Outpatient Follow-up:  1. Follow up with PCP [facility] with home visit in 1-2 weeks 2. Please obtain BMP/CBC in one week and restart losartan if needed 3. Please follow up ruine culture.      Home Health: None  Equipment/Devices: Walker  Discharge Condition: At baseline  CODE STATUS: FULL Diet recommendation: Cardiac  Brief/Interim Summary: Mrs. Furse is a 64 y.o. F with Covid-19 2 weeks ago, also history CVA no residual deficits, COPD not on O2, OSA and seizures who presented with AMS.     In the ED, patient confused and aphasic and perseverates to answer and then says an unrelated statement or word. No family at bedside. Potassium low.  Pyuria.  CXR clear, CT head unremarkable.       PRINCIPAL HOSPITAL DIAGNOSIS:  Dehydration    Discharge Diagnoses:   Altered mental status due to dehydration, possible UTI Patient presented with decreased sensorium.  In ER, urine showed pyuria, patient seemed to be complaining of dysuria, unclear duration of symptoms.  MRI brain obtained due to facial droop, this was unremarkable.  Seizure was doubted.  Started on ceftriaxone.  Transitioned to oral cephalexin 500 TID to complete 5 days.   COVID-19 without pneumonia CXR clear, not hypoxic.  Monitor SpO2 twice daily for 14 days.     Hypokalemia Repleted.  Cerebrovascular disease Continue atorvastatin and Plavix   Depression Continue Cymbalta, Seroquel.  Recommend reducing benzodiazepine.  Seizures No seizure activity witnessed here.  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  Continue Dulera  GERD  Continue sucralfate and pantoprazole  Hypertension Hold losartan.  Resume  in 4-7 days if Cr stable.          Discharge Instructions   Allergies as of 06/27/2019      Reactions   Ciprofloxacin Other (See Comments)   Other Reaction: Lowered seizure threshold   Aspirin Other (See Comments)   Reaction: Unknown      Medication List    TAKE these medications   albuterol 108 (90 Base) MCG/ACT inhaler Commonly known as: VENTOLIN HFA Inhale 2 puffs into the lungs every 4 (four) hours as needed for wheezing or shortness of breath.   Antacid Liquid [id number] MG/5ML suspension Generic drug: alum & mag hydroxide-simeth Take 30 mLs by mouth every 6 (six) hours as needed for indigestion or heartburn.   atorvastatin 10 MG tablet Commonly known as: LIPITOR Take 10 mg by mouth at bedtime.   cephALEXin 500 MG capsule Commonly known as: KEFLEX Take 1 capsule (500 mg total) by mouth 3 (three) times daily for 3 days.   cholecalciferol 10 MCG (400 UNIT) Tabs tablet Commonly known as: VITAMIN D3 Take 400 Units by mouth daily. Give with Vitamin D3 2000 units   cholecalciferol 1000 units tablet Commonly known as: VITAMIN D Take 2,000 Units by mouth daily. Give with Vitamin D3 400 units   clonazePAM 0.5 MG tablet Commonly known as: KLONOPIN Take 0.5 mg by mouth 3 (three) times daily.   clopidogrel 75 MG tablet Commonly known as: PLAVIX Take 75 mg by mouth daily.   diclofenac sodium 1 % Gel Commonly known as: VOLTAREN Apply 4 g topically 3 (three) times daily. Apply to both knees   diphenhydrAMINE 25 MG tablet Commonly known as: BENADRYL Take  25 mg by mouth every 6 (six) hours as needed for allergies.   DULoxetine 60 MG capsule Commonly known as: CYMBALTA Take 60 mg by mouth daily.   ferrous sulfate 325 (65 FE) MG tablet Take 325 mg by mouth 3 (three) times daily with meals.   gabapentin 100 MG capsule Commonly known as: NEURONTIN Take 200 mg by mouth 2 (two) times daily.   lamoTRIgine 200 MG tablet Commonly known as: LAMICTAL Take 200-400  mg by mouth 2 (two) times daily. Pt takes 2 tablets (400 mg) in the morning on Monday, Wednesday and Friday and only 1 tablet (200 mg) bid on all other days in the morning.   levETIRAcetam 500 MG tablet Commonly known as: KEPPRA Take 500-750 mg by mouth 2 (two) times daily. Take one tablet (500) by mouth daily in the morning and one and one-half tablets (750 mg) in the evening   loperamide 2 MG capsule Commonly known as: IMODIUM Take 4 mg by mouth as needed for diarrhea or loose stools.   magnesium hydroxide 400 MG/5ML suspension Commonly known as: MILK OF MAGNESIA Take 30 mLs by mouth daily as needed for mild constipation or moderate constipation.   mometasone-formoterol 100-5 MCG/ACT Aero Commonly known as: DULERA Inhale 2 puffs into the lungs 2 (two) times daily.   pantoprazole 40 MG tablet Commonly known as: PROTONIX Take 40 mg by mouth daily before breakfast.   phenytoin 100 MG ER capsule Commonly known as: DILANTIN Take 100 mg by mouth 2 (two) times daily.   QUEtiapine 25 MG tablet Commonly known as: SEROQUEL Take 25 mg by mouth at bedtime.   sucralfate 1 g tablet Commonly known as: CARAFATE Take 1 g by mouth 4 (four) times daily.   Vimpat 150 MG Tabs Generic drug: Lacosamide Take 1 tablet by mouth 2 (two) times daily.   vitamin B-12 500 MCG tablet Commonly known as: CYANOCOBALAMIN Take 500 mcg by mouth daily.       Allergies  Allergen Reactions  . Ciprofloxacin Other (See Comments)    Other Reaction: Lowered seizure threshold  . Aspirin Other (See Comments)    Reaction: Unknown    Consultations:     Procedures/Studies: CT HEAD WO CONTRAST  Result Date: 06/26/2019 CLINICAL DATA:  Altered mental status, confusion EXAM: CT HEAD WITHOUT CONTRAST TECHNIQUE: Contiguous axial images were obtained from the base of the skull through the vertex without intravenous contrast. COMPARISON:  05/30/2019 FINDINGS: Brain: No evidence of acute infarction, hemorrhage,  hydrocephalus, extra-axial collection or mass lesion/mass effect. Right temporal lobe encephalomalacia is unchanged. Mild cerebral volume loss, stable from prior. Vascular: Mild atherosclerotic calcifications involving the large vessels of the skull base. No unexpected hyperdense vessel. Skull: Chronic right temporal bone fracture. Negative for acute fracture or focal lesion. Sinuses/Orbits: Air-fluid level within the left sphenoid sinus. Remaining paranasal sinuses are clear. Orbital structures intact. Other: None. IMPRESSION: 1. No acute intracranial findings. 2. Age related cerebral volume loss, stable. Electronically Signed   By: Davina Poke D.O.   On: 06/26/2019 13:10   CT Head Wo Contrast  Result Date: 05/30/2019 CLINICAL DATA:  Mechanical fall, hit head EXAM: CT HEAD WITHOUT CONTRAST TECHNIQUE: Contiguous axial images were obtained from the base of the skull through the vertex without intravenous contrast. COMPARISON:  None. FINDINGS: Brain: No evidence of acute territorial infarction, hemorrhage, hydrocephalus,extra-axial collection or mass lesion/mass effect. There is dilatation the ventricles and sulci consistent with age-related atrophy. Low-attenuation changes in the deep white matter consistent with small vessel  ischemia. Vascular: No hyperdense vessel or unexpected calcification. Skull: The skull is intact. No fracture or focal lesion identified. Sinuses/Orbits: The visualized paranasal sinuses and mastoid air cells are clear. The orbits and globes intact. Other: None Cervical spine: Alignment: There is straightening of the normal cervical lordosis. Skull base and vertebrae: Visualized skull base is intact. No atlanto-occipital dissociation. The vertebral body heights are well maintained. No fracture or pathologic osseous lesion seen. Soft tissues and spinal canal: The visualized paraspinal soft tissues are unremarkable. No prevertebral soft tissue swelling is seen. The spinal canal is  grossly unremarkable, no large epidural collection or significant canal narrowing. Disc levels: Mild disc height loss with disc osteophyte complex and uncovertebral osteophytes are most notable from C4 through C6. There is mild canal narrowing and moderate neural foraminal narrowing at these levels. Upper chest: The lung apices are clear. Thoracic inlet is within normal limits. Aortic atherosclerosis noted. Other: None IMPRESSION: No acute intracranial abnormality. Findings consistent with age related atrophy and chronic small vessel ischemia No acute fracture or malalignment of the spine. Cervical spine spondylosis most notable from C4 through C6. Electronically Signed   By: Prudencio Pair M.D.   On: 05/30/2019 23:39   CT Cervical Spine Wo Contrast  Result Date: 05/30/2019 CLINICAL DATA:  Mechanical fall, hit head EXAM: CT HEAD WITHOUT CONTRAST TECHNIQUE: Contiguous axial images were obtained from the base of the skull through the vertex without intravenous contrast. COMPARISON:  None. FINDINGS: Brain: No evidence of acute territorial infarction, hemorrhage, hydrocephalus,extra-axial collection or mass lesion/mass effect. There is dilatation the ventricles and sulci consistent with age-related atrophy. Low-attenuation changes in the deep white matter consistent with small vessel ischemia. Vascular: No hyperdense vessel or unexpected calcification. Skull: The skull is intact. No fracture or focal lesion identified. Sinuses/Orbits: The visualized paranasal sinuses and mastoid air cells are clear. The orbits and globes intact. Other: None Cervical spine: Alignment: There is straightening of the normal cervical lordosis. Skull base and vertebrae: Visualized skull base is intact. No atlanto-occipital dissociation. The vertebral body heights are well maintained. No fracture or pathologic osseous lesion seen. Soft tissues and spinal canal: The visualized paraspinal soft tissues are unremarkable. No prevertebral soft  tissue swelling is seen. The spinal canal is grossly unremarkable, no large epidural collection or significant canal narrowing. Disc levels: Mild disc height loss with disc osteophyte complex and uncovertebral osteophytes are most notable from C4 through C6. There is mild canal narrowing and moderate neural foraminal narrowing at these levels. Upper chest: The lung apices are clear. Thoracic inlet is within normal limits. Aortic atherosclerosis noted. Other: None IMPRESSION: No acute intracranial abnormality. Findings consistent with age related atrophy and chronic small vessel ischemia No acute fracture or malalignment of the spine. Cervical spine spondylosis most notable from C4 through C6. Electronically Signed   By: Prudencio Pair M.D.   On: 05/30/2019 23:39   MR BRAIN WO CONTRAST  Result Date: 06/27/2019 CLINICAL DATA:  Recent coronavirus infection. Seizures. Altered mental status. Confusion. EXAM: MRI HEAD WITHOUT CONTRAST TECHNIQUE: Multiplanar, multiecho pulse sequences of the brain and surrounding structures were obtained without intravenous contrast. COMPARISON:  Head CT yesterday.  MRI 08/01/2018 FINDINGS: Brain: Diffusion imaging does not show any acute or subacute infarction. No focal abnormality affects the brainstem or cerebellum. 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 Minimal small vessel change of the deep white matter. No mass lesion, hemorrhage, hydrocephalus or extra-axial collection. No visible change since the prior exam. Vascular: Major vessels at  the base of the brain show flow. Skull and upper cervical spine: Negative Sinuses/Orbits: Sinuses are clear except for a tiny amount of fluid dependent in the right maxillary sinus, not likely significant. Orbits negative. Other: None IMPRESSION: No change since February 2020. No acute finding to explain the clinical change. 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 Minimal small vessel change  of the hemispheric white matter. Electronically Signed   By: Nelson Chimes M.D.   On: 06/27/2019 14:16   DG Chest Port 1 View  Result Date: 06/26/2019 CLINICAL DATA:  COVID-19 EXAM: PORTABLE CHEST 1 VIEW COMPARISON:  Portable exam 1016 hours compared to 03/27/2019 FINDINGS: Enlargement of cardiac silhouette. Mediastinal contours and pulmonary vascularity normal. Atherosclerotic calcification aorta. Slightly decreased lung volumes versus previous study with minimal bibasilar atelectasis. No definite acute infiltrate, pleural effusion or pneumothorax. Bones demineralized. IMPRESSION: Enlargement of cardiac silhouette with minimal bibasilar atelectasis. No definite acute infiltrate. Electronically Signed   By: Lavonia Dana M.D.   On: 06/26/2019 10:49   DG Abd Portable 1V  Result Date: 06/27/2019 CLINICAL DATA:  Pre MRI clearance EXAM: PORTABLE ABDOMEN - 1 VIEW COMPARISON:  CT 08/21/2017 FINDINGS: The bowel gas pattern is normal. Cholecystectomy clips. Bilateral pelvic phleboliths. Regional bones unremarkable. Visualized lung bases clear. IMPRESSION: Normal bowel gas pattern.  Cholecystectomy clips. Electronically Signed   By: Lucrezia Europe M.D.   On: 06/27/2019 12:11      Subjective: Feeling well.  No seizures.  No headache, no confusion, no LOC.  Has some dysuria, she is unable to articulate if this is chronic or acute.  No fever, no vomiting.  Discharge Exam: Vitals:   06/27/19 0517 06/27/19 0827  BP: 124/84 136/71  Pulse: 69 63  Resp: 20 18  Temp: 98.2 F (36.8 C) (!) 97.5 F (36.4 C)  SpO2: 98% 95%   Vitals:   06/26/19 1713 06/26/19 2015 06/27/19 0517 06/27/19 0827  BP: 139/75 139/69 124/84 136/71  Pulse: 72 71 69 63  Resp: 18 20 20 18   Temp: 99.1 F (37.3 C) 98.7 F (37.1 C) 98.2 F (36.8 C) (!) 97.5 F (36.4 C)  TempSrc: Tympanic Oral Oral Oral  SpO2: 98% 96% 98% 95%  Weight:      Height:        General: Pt is alert, awake, not in acute distress, watching TV, makes  recommendation to watch the show she is watching Cardiovascular: RRR, nl S1-S2, no murmurs appreciated.   No LE edema.   Respiratory: Normal respiratory rate and rhythm.  CTAB without rales or wheezes. Abdominal: Abdomen soft and non-tender.  No distension or HSM.   Neuro/Psych: Strength symmetric in upper and lower extremities.  Judgment and insight appear impaired but clearly at baseline.   The results of significant diagnostics from this hospitalization (including imaging, microbiology, ancillary and laboratory) are listed below for reference.     Microbiology: Recent Results (from the past 240 hour(s))  Urine culture     Status: Abnormal (Preliminary result)   Collection Time: 06/26/19 11:20 AM   Specimen: Urine, Catheterized  Result Value Ref Range Status   Specimen Description   Final    URINE, CATHETERIZED Performed at [facility], [address] [place], [place] [address]    Special Requests   Final    NONE Performed at [facility], [address] [place], [place] [address]    Culture (A)  Final    >=100,000 COLONIES/mL GRAM NEGATIVE RODS IDENTIFICATION AND SUSCEPTIBILITIES TO  FOLLOW Performed at [facility], [place] [address], [place], [place] [address]    Report Status PENDING  Incomplete  MRSA PCR Screening     Status: None   Collection Time: 06/26/19  7:00 PM   Specimen: Nasopharyngeal  Result Value Ref Range Status   MRSA by PCR NEGATIVE NEGATIVE Final    Comment:        The GeneXpert MRSA Assay (FDA approved for NASAL specimens only), is one component of a comprehensive MRSA colonization surveillance program. It is not intended to diagnose MRSA infection nor to guide or monitor treatment for MRSA infections. Performed at [facility], [address] [place], [place] [address]      Labs: BNP (last 3 results) No results for input(s): BNP in the last 8760 hours. Basic Metabolic Panel: Recent Labs  Lab  06/26/19 0955 06/26/19 1820 06/27/19 0546  NA 143 145 142  K 2.5* 3.8 3.7  CL 106 109 106  CO2 24 23 24   GLUCOSE 91 98 83  BUN 28* 29* 28*  CREATININE 0.96 0.79 0.76  CALCIUM 9.2 9.0 9.1  MG 2.0 2.1  --   PHOS  --  2.5  --    Liver Function Tests: Recent Labs  Lab 06/26/19 0955 06/26/19 1820 06/27/19 0546  AST 30 27 28   ALT 27 24 23   ALKPHOS 63 56 53  BILITOT 1.0 0.7 0.7  PROT 7.5 6.8 6.6  ALBUMIN 4.0 3.6 3.4*   No results for input(s): LIPASE, AMYLASE in the last 168 hours. No results for input(s): AMMONIA in the last 168 hours. CBC: Recent Labs  Lab 06/26/19 0955 06/26/19 1820 06/27/19 0546  WBC 5.2 5.9 4.5  NEUTROABS 3.4  --   --   HGB 13.4 12.3 12.4  HCT 38.6 34.7* 35.8*  MCV 99.0 99.1 100.0  PLT 292 278 257   Cardiac Enzymes: No results for input(s): CKTOTAL, CKMB, CKMBINDEX, TROPONINI in the last 168 hours. BNP: Invalid input(s): POCBNP CBG: No results for input(s): GLUCAP in the last 168 hours. D-Dimer No results for input(s): DDIMER in the last 72 hours. Hgb A1c Recent Labs    06/26/19 1820  HGBA1C 5.5   Lipid Profile No results for input(s): CHOL, HDL, LDLCALC, TRIG, CHOLHDL, LDLDIRECT in the last 72 hours. Thyroid function studies Recent Labs    06/26/19 1820  TSH 0.853   Anemia work up No results for input(s): VITAMINB12, FOLATE, FERRITIN, TIBC, IRON, RETICCTPCT in the last 72 hours. Urinalysis    Component Value Date/Time   COLORURINE AMBER (A) 06/26/2019 1120   APPEARANCEUR CLOUDY (A) 06/26/2019 1120   APPEARANCEUR Clear 03/18/2014 1912   LABSPEC 1.034 (H) 06/26/2019 1120   LABSPEC 1.004 03/18/2014 1912   PHURINE 5.0 06/26/2019 1120   GLUCOSEU NEGATIVE 06/26/2019 1120   GLUCOSEU Negative 03/18/2014 1912   HGBUR MODERATE (A) 06/26/2019 1120   BILIRUBINUR SMALL (A) 06/26/2019 1120   BILIRUBINUR Negative 03/18/2014 1912   KETONESUR 80 (A) 06/26/2019 1120   PROTEINUR >=300 (A) 06/26/2019 1120   UROBILINOGEN 0.2 11/06/2010  0900   NITRITE POSITIVE (A) 06/26/2019 1120   LEUKOCYTESUR SMALL (A) 06/26/2019 1120   LEUKOCYTESUR Negative 03/18/2014 1912   Sepsis Labs Invalid input(s): PROCALCITONIN,  WBC,  LACTICIDVEN Microbiology Recent Results (from the past 240 hour(s))  Urine culture     Status: Abnormal (Preliminary result)   Collection Time: 06/26/19 11:20 AM   Specimen: Urine, Catheterized  Result Value Ref Range Status   Specimen Description  Final    URINE, CATHETERIZED Performed at [facility], [address], [place], [place] [address]    Special Requests   Final    NONE Performed at [facility], [address] [place], [place] [address]    Culture (A)  Final    >=100,000 COLONIES/mL Lonell Grandchild NEGATIVE RODS IDENTIFICATION AND SUSCEPTIBILITIES TO FOLLOW Performed at [facility], [place] [address] [place], [place] [address]    Report Status PENDING  Incomplete  MRSA PCR Screening     Status: None   Collection Time: 06/26/19  7:00 PM   Specimen: Nasopharyngeal  Result Value Ref Range Status   MRSA by PCR NEGATIVE NEGATIVE Final    Comment:        The GeneXpert MRSA Assay (FDA approved for NASAL specimens only), is one component of a comprehensive MRSA colonization surveillance program. It is not intended to diagnose MRSA infection nor to guide or monitor treatment for MRSA infections. Performed at [facility], [address], [place], [place] [address]      Time coordinating discharge: 25 minutes      SIGNED:   Edwin Dada, MD  [facility] 06/27/2019, 2:28 PM

## 2019-06-27 NOTE — NC FL2 (Addendum)
[place] LEVEL OF CARE SCREENING TOOL     IDENTIFICATION  Patient Name: Marcia Brown Birthdate: May 01, 1956 Sex: female Admission Date (Current Location): 06/26/2019  [place] and [place] Number:  Selena Lesser [id number] [place] and Address:  [facility], [address] [place], [place] [address]      Provider Number: [id number]  Attending Physician Name and Address:  Edwin Dada, *  Relative Name and Phone Number:  Silvestre Moment Niece   [phone]    Current Level of Care: Hospital Recommended Level of Care: [place] Prior Approval Number:    Date Approved/Denied:   PASRR Number:    Discharge Plan: Domiciliary (Rest home)([facility])    Current Diagnoses: Patient Active Problem List   Diagnosis Date Noted  . Acute lower UTI 06/26/2019  . Hypokalemia 06/26/2019  . AMS (altered mental status) 06/26/2019  . Tobacco abuse 12/27/2015  . Repeated falls 05/24/2015  . Seizure ([place]) 02/03/2015  . 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    Orientation RESPIRATION BLADDER Height & Weight     Self, Situation, Place  Normal Incontinent Weight: 90.7 kg Height:  5\' 5"  (165.1 cm)  BEHAVIORAL SYMPTOMS/MOOD NEUROLOGICAL BOWEL NUTRITION STATUS      Continent Diet  AMBULATORY STATUS COMMUNICATION OF NEEDS Skin   Supervision Verbally Normal                       Personal Care Assistance Level of Assistance  Bathing, Dressing, Feeding Bathing Assistance: Limited assistance Feeding assistance: Independent Dressing Assistance: Limited assistance     Functional Limitations Info  Sight, Hearing, Speech Sight Info: Adequate Hearing Info: Adequate Speech Info: Adequate    SPECIAL CARE FACTORS FREQUENCY                       Contractures Contractures Info: Not present    Additional Factors Info  Code Status, Allergies, Psychotropic, Isolation Precautions Code Status Info: Full code Allergies Info:  Ciprofloxacin Aspirin Psychotropic Info: QUEtiapine (SEROQUEL) tablet 25 mg         Current Medications (06/27/2019):  This is the current hospital active medication list Current Facility-Administered Medications  Medication Dose Route Frequency Provider Last Rate Last Admin  . 0.9 % NaCl with KCl 20 mEq/ L  infusion   Intravenous Continuous Edwin Dada, MD 100 mL/hr at 06/27/19 0840 New Bag at 06/27/19 0840  . albuterol (PROVENTIL) (2.5 MG/3ML) 0.083% nebulizer solution 2.5 mg  2.5 mg Inhalation Q4H PRN Para Skeans, MD      . alum & mag hydroxide-simeth (MAALOX/MYLANTA) 200-200-20 MG/5ML suspension 30 mL  30 mL Oral Q6H PRN Para Skeans, MD      . atorvastatin (LIPITOR) tablet 10 mg  10 mg Oral QHS Para Skeans, MD   10 mg at 06/26/19 2142  . cefTRIAXone (ROCEPHIN) 1 g in sodium chloride 0.9 % 100 mL IVPB  1 g Intravenous Q24H Florina Ou V, MD 200 mL/hr at 06/27/19 0841 1 g at 06/27/19 0841  . clonazePAM (KLONOPIN) tablet 0.5 mg  0.5 mg Oral Daily Edwin Dada, MD   0.5 mg at 06/27/19 0828  . clopidogrel (PLAVIX) tablet 75 mg  75 mg Oral Daily Para Skeans, MD   75 mg at 06/27/19 0828  . DULoxetine (CYMBALTA) DR capsule 60 mg  60 mg Oral Daily Para Skeans, MD   60 mg at 06/27/19 0827  . enoxaparin (LOVENOX) injection 40  mg  40 mg Subcutaneous Q24H Para Skeans, MD   40 mg at 06/26/19 2140  . ferrous sulfate tablet 325 mg  325 mg Oral TID WC Para Skeans, MD   325 mg at 06/27/19 0959  . gabapentin (NEURONTIN) capsule 200 mg  200 mg Oral BID Edwin Dada, MD   200 mg at 06/27/19 [id number]  . lacosamide (VIMPAT) tablet 150 mg  150 mg Oral BID Para Skeans, MD   150 mg at 06/27/19 0828  . lamoTRIgine (LAMICTAL) tablet 200 mg  200 mg Oral 2 times per day on Sun Tue Thu Sat Para Skeans, MD   200 mg at 06/27/19 [id number]  . lamoTRIgine (LAMICTAL) tablet 400 mg  400 mg Oral 2 times per day on Mon Wed Fri Para Skeans, MD   400 mg at 06/26/19 2020  . levETIRAcetam  (KEPPRA) tablet 500 mg  500 mg Oral Daily Para Skeans, MD   500 mg at 06/27/19 [id number]  . magnesium hydroxide (MILK OF MAGNESIA) suspension 30 mL  30 mL Oral Daily PRN Para Skeans, MD      . mometasone-formoterol (DULERA) 100-5 MCG/ACT inhaler 2 puff  2 puff Inhalation BID Para Skeans, MD   2 puff at 06/27/19 0830  . pantoprazole (PROTONIX) EC tablet 40 mg  40 mg Oral QAC breakfast Para Skeans, MD   40 mg at 06/27/19 0959  . phenytoin (DILANTIN) ER capsule 100 mg  100 mg Oral BID Para Skeans, MD   100 mg at 06/27/19 0829  . QUEtiapine (SEROQUEL) tablet 25 mg  25 mg Oral QHS Para Skeans, MD   25 mg at 06/26/19 2142  . sucralfate (CARAFATE) tablet 1 g  1 g Oral QID Para Skeans, MD   1 g at 06/27/19 1300  . vitamin B-12 (CYANOCOBALAMIN) tablet 500 mcg  500 mcg Oral Daily Para Skeans, MD   500 mcg at 06/27/19 [id number]     Discharge Medications: TAKE these medications   albuterol 108 (90 Base) MCG/ACT inhaler Commonly known as: VENTOLIN HFA Inhale 2 puffs into the lungs every 4 (four) hours as needed for wheezing or shortness of breath.   Antacid Liquid [id number] MG/5ML suspension Generic drug: alum & mag hydroxide-simeth Take 30 mLs by mouth every 6 (six) hours as needed for indigestion or heartburn.   atorvastatin 10 MG tablet Commonly known as: LIPITOR Take 10 mg by mouth at bedtime.   cephALEXin 500 MG capsule Commonly known as: KEFLEX Take 1 capsule (500 mg total) by mouth 3 (three) times daily for 3 days.   cholecalciferol 10 MCG (400 UNIT) Tabs tablet Commonly known as: VITAMIN D3 Take 400 Units by mouth daily. Give with Vitamin D3 2000 units   cholecalciferol 1000 units tablet Commonly known as: VITAMIN D Take 2,000 Units by mouth daily. Give with Vitamin D3 400 units   clonazePAM 0.5 MG tablet Commonly known as: KLONOPIN Take 0.5 mg by mouth 3 (three) times daily.   clopidogrel 75 MG tablet Commonly known as: PLAVIX Take 75 mg by mouth daily.    diclofenac sodium 1 % Gel Commonly known as: VOLTAREN Apply 4 g topically 3 (three) times daily. Apply to both knees   diphenhydrAMINE 25 MG tablet Commonly known as: BENADRYL Take 25 mg by mouth every 6 (six) hours as needed for allergies.   DULoxetine 60 MG capsule Commonly known as: CYMBALTA Take 60 mg by mouth daily.   ferrous  sulfate 325 (65 FE) MG tablet Take 325 mg by mouth 3 (three) times daily with meals.   gabapentin 100 MG capsule Commonly known as: NEURONTIN Take 200 mg by mouth 2 (two) times daily.   lamoTRIgine 200 MG tablet Commonly known as: LAMICTAL Take 200-400 mg by mouth 2 (two) times daily. Pt takes 2 tablets (400 mg) in the morning on Monday, Wednesday and Friday and only 1 tablet (200 mg) bid on all other days in the morning.   levETIRAcetam 500 MG tablet Commonly known as: KEPPRA Take 500-750 mg by mouth 2 (two) times daily. Take one tablet (500) by mouth daily in the morning and one and one-half tablets (750 mg) in the evening   loperamide 2 MG capsule Commonly known as: IMODIUM Take 4 mg by mouth as needed for diarrhea or loose stools.   magnesium hydroxide 400 MG/5ML suspension Commonly known as: MILK OF MAGNESIA Take 30 mLs by mouth daily as needed for mild constipation or moderate constipation.   mometasone-formoterol 100-5 MCG/ACT Aero Commonly known as: DULERA Inhale 2 puffs into the lungs 2 (two) times daily.   pantoprazole 40 MG tablet Commonly known as: PROTONIX Take 40 mg by mouth daily before breakfast.   phenytoin 100 MG ER capsule Commonly known as: DILANTIN Take 100 mg by mouth 2 (two) times daily.   QUEtiapine 25 MG tablet Commonly known as: SEROQUEL Take 25 mg by mouth at bedtime.   sucralfate 1 g tablet Commonly known as: CARAFATE Take 1 g by mouth 4 (four) times daily.   Vimpat 150 MG Tabs Generic drug: Lacosamide Take 1 tablet by mouth 2 (two) times daily.   vitamin B-12 500 MCG tablet Commonly  known as: CYANOCOBALAMIN Take 500 mcg by mouth daily.     Relevant Imaging Results:  Relevant Lab Results:   Additional Information SSN [id number]  Edwin Dada, MD

## 2019-06-27 NOTE — TOC Transition Note (Signed)
Transition of Care [facility]) - CM/SW Discharge Note   Patient Details  Name: Marcia Brown MRN: [id number] Date of Birth: 1956-04-07  Transition of [facility]) CM/SW Contact:  Ross Ludwig, LCSW Phone Number: 06/27/2019, 4:02 PM   Clinical Narrative:     Patient will be discharging back to [facility].  CSW spoke to [place], who said patient can return today.  CSW faxed discharge summary and FL2 to [phone], CSW also printed copies and put them in EMS packet.  CSW contacted patient's niece Kendrick Fries to let her know that patient is discharging today.  Final next level of care: Assisted Living Barriers to Discharge: Barriers Resolved   Patient Goals and CMS Choice Patient states their goals for this hospitalization and ongoing recovery are:: To return back to [facility] with home health. CMS Medicare.gov Compare Post Acute Care list provided to:: Patient Represenative (must comment) Choice offered to / list presented to : [place] / [place]  Discharge Placement  [facility]              Patient to be transferred to facility by: [facility] Name of family member notified: Patient's niece Kendrick Fries [phone] Patient and family notified of of transfer: 06/27/19  Discharge Plan and [facility] with home health PT.   Post Acute Care Choice: Home Health          DME Arranged: N/A DME Agency: NA       HH Arranged: PT HH Agency: [facility] Date HH Agency Contacted: 06/27/19 Time HH Agency Contacted: 1430 Representative spoke with at [place]: Cassie  Social Determinants of Health ([place]) Interventions     Readmission Risk Interventions No flowsheet data found.

## 2019-06-28 LAB — URINE CULTURE: Culture: >=100000 — AB

## 2019-06-29 LAB — LAMOTRIGINE LEVEL: Lamotrigine Lvl: 4.6 ug/mL (ref 2.0–20.0)

## 2019-06-29 LAB — LEVETIRACETAM LEVEL: Levetiracetam Lvl: 11.1 ug/mL (ref 10.0–40.0)

## 2019-06-30 LAB — LACOSAMIDE: Lacosamide: 3.8 ug/mL — ABNORMAL LOW (ref 5.0–10.0)

## 2019-08-03 ENCOUNTER — Emergency Department: Payer: Medicare Other

## 2019-08-03 ENCOUNTER — Emergency Department
Admission: EM | Admit: 2019-08-03 | Discharge: 2019-08-03 | Disposition: A | Discharge status: Home / Self Care | Payer: Medicare Other | Attending: Emergency Medicine | Admitting: Emergency Medicine

## 2019-08-03 ENCOUNTER — Encounter: Payer: Self-pay | Admitting: Intensive Care

## 2019-08-03 ENCOUNTER — Other Ambulatory Visit: Payer: Self-pay

## 2019-08-03 DIAGNOSIS — S299XXA Unspecified injury of thorax, initial encounter: Secondary | ICD-10-CM | POA: Diagnosis present

## 2019-08-03 DIAGNOSIS — S2241XA Multiple fractures of ribs, right side, initial encounter for closed fracture: Secondary | ICD-10-CM | POA: Insufficient documentation

## 2019-08-03 DIAGNOSIS — Y939 Activity, unspecified: Secondary | ICD-10-CM | POA: Insufficient documentation

## 2019-08-03 DIAGNOSIS — J45909 Unspecified asthma, uncomplicated: Secondary | ICD-10-CM | POA: Diagnosis not present

## 2019-08-03 DIAGNOSIS — J449 Chronic obstructive pulmonary disease, unspecified: Secondary | ICD-10-CM | POA: Insufficient documentation

## 2019-08-03 DIAGNOSIS — Y999 Unspecified external cause status: Secondary | ICD-10-CM | POA: Diagnosis not present

## 2019-08-03 DIAGNOSIS — Z79899 Other long term (current) drug therapy: Secondary | ICD-10-CM | POA: Diagnosis not present

## 2019-08-03 DIAGNOSIS — W010XXA Fall on same level from slipping, tripping and stumbling without subsequent striking against object, initial encounter: Secondary | ICD-10-CM | POA: Insufficient documentation

## 2019-08-03 DIAGNOSIS — Y929 Unspecified place or not applicable: Secondary | ICD-10-CM | POA: Insufficient documentation

## 2019-08-03 MED ORDER — TRAMADOL HCL 50 MG PO TABS
50.0000 mg | ORAL_TABLET | Freq: Once | ORAL | Status: AC
  Administered 2019-08-03: 50 mg via ORAL
  Filled 2019-08-03: qty 1

## 2019-08-03 MED ORDER — TRAMADOL HCL 50 MG PO TABS: 50.0000 mg | ORAL_TABLET | Freq: Once | ORAL | Status: DC

## 2019-08-03 MED ORDER — TRAMADOL HCL 50 MG PO TABS: 50.0000 mg | ORAL_TABLET | Freq: Four times a day (QID) | ORAL | 0 refills | Status: DC | PRN

## 2019-08-03 NOTE — Discharge Instructions (Addendum)
You have 3 rib fractures on the right side.  You may take the tramadol as needed for pain.  You should use an incentive spirometer every few hours over the next several weeks to help maintain full breaths.  Return to the ER immediately for new, worsening, or persistent severe pain, worsening shortness of breath, cough, fever, weakness, or any other new or worsening symptoms that concern you.

## 2019-08-03 NOTE — ED Provider Notes (Signed)
[facility] Emergency Department Provider Note ____________________________________________   First MD Initiated Contact with Patient 08/03/19 1405     (approximate)  I have reviewed the triage vital signs and the nursing notes.   HISTORY  Chief Complaint Fall    HPI Marcia Brown is a 64 y.o. female with PMH as noted below who presents with right anterior rib area pain after a fall, acute onset just prior to arrival, and occurring when she states that she slipped on a wet floor.  The patient denies hitting her head, did not have LOC, and has no back or extremity pain.  She states that she remembers the fall clearly, and did not feel dizzy or weak prior to falling.  She denies any shortness of breath but states it is painful to cough, and certain positions are painful as well.  Past Medical History:  Diagnosis Date  . Anxiety   . Asthma   . Cerebral ischemia   . COPD (chronic obstructive pulmonary disease) ([place])   . Depression   . High cholesterol   . Polyneuropathy   . Seizures ([place])   . Sleep apnea     Patient Active Problem List   Diagnosis Date Noted  . Acute lower UTI 06/26/2019  . Hypokalemia 06/26/2019  . AMS (altered mental status) 06/26/2019  . Tobacco abuse 12/27/2015  . Repeated falls 05/24/2015  . Seizure ([place]) 02/03/2015  . 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 ESOPHAGEAL DILATION    . FOOT SURGERY Right     Prior to Admission medications   Medication Sig Start Date End Date Taking? Authorizing Provider  albuterol (PROVENTIL HFA;VENTOLIN HFA) 108 (90 BASE) MCG/ACT inhaler Inhale 2 puffs into the lungs every 4 (four) hours as needed for wheezing or shortness of breath.    [provider]  alum & mag hydroxide-simeth (ANTACID LIQUID) 200-200-20 MG/5ML suspension Take 30 mLs by mouth every 6 (six) hours as needed for indigestion or heartburn.    [provider]    atorvastatin (LIPITOR) 10 MG tablet Take 10 mg by mouth at bedtime.     [provider]  cholecalciferol (VITAMIN D) 1000 units tablet Take 2,000 Units by mouth daily. Give with Vitamin D3 400 units    [provider]  cholecalciferol (VITAMIN D) 400 UNITS TABS tablet Take 400 Units by mouth daily. Give with Vitamin D3 2000 units    [provider]  clonazePAM (KLONOPIN) 0.5 MG tablet Take 0.5 mg by mouth 3 (three) times daily.     [provider]  clopidogrel (PLAVIX) 75 MG tablet Take 75 mg by mouth daily. 06/19/19   [provider]  cyanocobalamin 500 MCG tablet Take 500 mcg by mouth daily.    [provider]  diclofenac sodium (VOLTAREN) 1 % GEL Apply 4 g topically 3 (three) times daily. Apply to both knees    [provider]  diphenhydrAMINE (BENADRYL) 25 MG tablet Take 25 mg by mouth every 6 (six) hours as needed for allergies.    [provider]  DULoxetine (CYMBALTA) 60 MG capsule Take 60 mg by mouth daily.    [provider]  ferrous sulfate 325 (65 FE) MG tablet Take 325 mg by mouth 3 (three) times daily with meals.    [provider]  gabapentin (NEURONTIN) 100 MG capsule Take 200 mg by mouth 2 (two) times daily.    [provider]  lamoTRIgine (  LAMICTAL) 200 MG tablet Take 200-400 mg by mouth 2 (two) times daily. Pt takes 2 tablets (400 mg) in the morning on Monday, Wednesday and Friday and only 1 tablet (200 mg) bid on all other days in the morning.    [provider]  levETIRAcetam (KEPPRA) 500 MG tablet Take 500-750 mg by mouth 2 (two) times daily. Take one tablet (500) by mouth daily in the morning and one and one-half tablets (750 mg) in the evening 06/19/19   [provider]  loperamide (IMODIUM) 2 MG capsule Take 4 mg by mouth as needed for diarrhea or loose stools.    [provider]  magnesium hydroxide (MILK OF MAGNESIA) 400 MG/5ML suspension Take 30 mLs by  mouth daily as needed for mild constipation or moderate constipation.    [provider]  mometasone-formoterol (DULERA) 100-5 MCG/ACT AERO Inhale 2 puffs into the lungs 2 (two) times daily.    [provider]  pantoprazole (PROTONIX) 40 MG tablet Take 40 mg by mouth daily before breakfast.     [provider]  phenytoin (DILANTIN) 100 MG ER capsule Take 100 mg by mouth 2 (two) times daily.     [provider]  QUEtiapine (SEROQUEL) 25 MG tablet Take 25 mg by mouth at bedtime. 06/19/19   [provider]  sucralfate (CARAFATE) 1 g tablet Take 1 g by mouth 4 (four) times daily. 06/19/19   [provider]  traMADol (ULTRAM) 50 MG tablet Take 1 tablet (50 mg total) by mouth every 6 (six) hours as needed for up to 7 days for moderate pain. 08/03/19 08/10/19  Arta Silence, MD  VIMPAT 150 MG TABS Take 1 tablet by mouth 2 (two) times daily. 06/03/19   [provider]    Allergies Ciprofloxacin and Aspirin  Family History  Problem Relation Age of Onset  . Breast cancer Daughter 80    Social History Social History   Tobacco Use  . Smoking status: Current Every Day Smoker    Packs/day: 0.50    Types: Cigarettes  . Smokeless tobacco: Never Used  Substance Use Topics  . Alcohol use: No  . Drug use: No    Review of Systems  Constitutional: No fever. Eyes: No redness. ENT: No neck pain. Cardiovascular: Denies chest pain. Respiratory: Denies shortness of breath. Gastrointestinal: No vomiting or diarrhea.  Genitourinary: Negative for flank pain.  Musculoskeletal: Negative for back, arm, or leg pain. Skin: Negative for rash. Neurological: Negative for headache.   ____________________________________________   PHYSICAL EXAM:  VITAL SIGNS: ED Triage Vitals  Enc Vitals Group     BP 08/03/19 1404 (!) 147/76     Pulse Rate 08/03/19 1404 61     Resp 08/03/19 1404 14     Temp 08/03/19 1404 98.3 F (36.8 C)     Temp Source  08/03/19 1404 Oral     SpO2 08/03/19 1404 94 %     Weight 08/03/19 1403 180 lb (81.6 kg)     Height 08/03/19 1403 5\' 8"  (1.727 m)     Head Circumference --      Peak Flow --      Pain Score 08/03/19 1402 10     Pain Loc --      Pain Edu? --      Excl. in [place]? --     Constitutional: Alert and oriented.  Relatively comfortable appearing and in no acute distress. Eyes: Conjunctivae are normal.  EOMI. Head: Atraumatic. Nose: No congestion/rhinnorhea. Mouth/Throat:  Mucous membranes are moist.   Neck: Normal range of motion.  No midline cervical spinal tenderness. Cardiovascular: Normal rate, regular rhythm. Grossly normal heart sounds.  Good peripheral circulation. Respiratory: Normal respiratory effort.  No retractions. Lungs CTAB. Gastrointestinal: Soft and nontender. No distention.  Genitourinary: No flank tenderness. Musculoskeletal: No lower extremity edema.  No midline spinal tenderness.  Pain to anterior lower right rib area. Neurologic: Motor intact in all extremities. Skin:  Skin is warm and dry. No rash noted. Psychiatric: Mood and affect are normal. Speech and behavior are normal.  ____________________________________________   LABS (all labs ordered are listed, but only abnormal results are displayed)  Labs Reviewed - No data to display ____________________________________________  EKG   ____________________________________________  RADIOLOGY  XR R rib: Fractures of lateral fifth through seventh ribs  ____________________________________________   PROCEDURES  Procedure(s) performed: No  Procedures  Critical Care performed: No ____________________________________________   INITIAL IMPRESSION / ASSESSMENT AND PLAN / ED COURSE  Pertinent labs & imaging results that were available during my care of the patient were reviewed by me and considered in my medical decision making (see chart for details).  64 year old female with PMH as noted above presents  from her facility after a mechanical fall from standing height.  The patient states that she slipped on a floor that was wet.  She has pain only to the right anterior lower rib area, but denies chest or abdominal pain, neck or back pain or any pain to the extremities.  She did not hit her head.  On exam, she is overall relatively comfortable appearing.  Her vital signs are normal.  She has no significant increased work of breathing.  She does have localized tenderness in the right anterior lower rib area with no crepitus.  Presentation is most consistent with rib fracture versus contusion.  Will obtain x-rays.  There is no indication for lab work-up, and no evidence of head injury or other injuries requiring imaging.  ----------------------------------------- 4:33 PM on 08/03/2019 -----------------------------------------  X-rays show 3 contiguous rib fractures in the right anterior lower rib area.  Given the 3 ribs involved as well as the patient's age and comorbidities, admission could be considered.  However, she overall appears well and does not have significant pain unless she is moving around.  She was breathing comfortably prior to being given analgesia, now continues to appear comfortable after a tramadol, which she states helped.  Therefore, she has no strict indications for admission and is also a reasonable candidate for outpatient management with analgesia and incentive spirometry.  I discussed the results of the work-up with her, and I offered her admission.  The patient did not express a strong preference, but stated that she felt comfortable being discharged with the same pain medication given in the ED.  I explained the importance of incentive spirometry, as well as the risks of inadequate inspiration and the potential for atelectasis and pneumonia, as well as the return precautions.  The patient expressed understanding.    ____________________________________________   FINAL  CLINICAL IMPRESSION(S) / ED DIAGNOSES  Final diagnoses:  Closed fracture of multiple ribs of right side, initial encounter      NEW MEDICATIONS STARTED DURING THIS VISIT:  New Prescriptions   TRAMADOL (ULTRAM) 50 MG TABLET    Take 1 tablet (50 mg total) by mouth every 6 (six) hours as needed for up to 7 days for moderate pain.     Note:  This document was prepared using Set designer  software and may include unintentional dictation errors.   Arta Silence, MD 08/03/19 [phone]

## 2019-08-03 NOTE — ED Triage Notes (Signed)
Patient arrived by EMS from [facility] for unwitnessed fall. She reports she fell due to floor being wet. Denies LOC or hitting head. C/o right sided rib cage pain. A&O x4 upon arrival to ER. Able to ambulate with EMS to stretcher

## 2019-08-03 NOTE — ED Notes (Signed)
Per Network engineer pt is next on list with EMS for transport

## 2019-08-03 NOTE — ED Notes (Signed)
Assisted pt to restroom. PT given more water, offered her food but pt did not want.

## 2019-08-03 NOTE — ED Notes (Signed)
MD Siadecki at bedside 

## 2019-08-03 NOTE — ED Notes (Addendum)
PT given incentive spirometer and provided teaching. PT reached about 950 on third try. Encouraged pt to try again later. PT taken to restroom PT given water.

## 2019-08-04 ENCOUNTER — Other Ambulatory Visit: Payer: Self-pay

## 2019-08-04 ENCOUNTER — Observation Stay
Admission: EM | Admit: 2019-08-04 | Discharge: 2019-08-05 | Disposition: A | Discharge status: Skilled Nursing Facility | Payer: Medicare Other | Attending: Family Medicine | Admitting: Family Medicine

## 2019-08-04 ENCOUNTER — Emergency Department: Payer: Medicare Other

## 2019-08-04 DIAGNOSIS — R296 Repeated falls: Secondary | ICD-10-CM | POA: Diagnosis not present

## 2019-08-04 DIAGNOSIS — K219 Gastro-esophageal reflux disease without esophagitis: Secondary | ICD-10-CM | POA: Insufficient documentation

## 2019-08-04 DIAGNOSIS — W19XXXA Unspecified fall, initial encounter: Secondary | ICD-10-CM | POA: Insufficient documentation

## 2019-08-04 DIAGNOSIS — E538 Deficiency of other specified B group vitamins: Secondary | ICD-10-CM | POA: Diagnosis not present

## 2019-08-04 DIAGNOSIS — R52 Pain, unspecified: Secondary | ICD-10-CM

## 2019-08-04 DIAGNOSIS — Z8616 Personal history of COVID-19: Secondary | ICD-10-CM | POA: Diagnosis not present

## 2019-08-04 DIAGNOSIS — Z79899 Other long term (current) drug therapy: Secondary | ICD-10-CM | POA: Diagnosis not present

## 2019-08-04 DIAGNOSIS — G40919 Epilepsy, unspecified, intractable, without status epilepticus: Secondary | ICD-10-CM | POA: Insufficient documentation

## 2019-08-04 DIAGNOSIS — R29810 Facial weakness: Secondary | ICD-10-CM | POA: Insufficient documentation

## 2019-08-04 DIAGNOSIS — E7800 Pure hypercholesterolemia, unspecified: Secondary | ICD-10-CM | POA: Insufficient documentation

## 2019-08-04 DIAGNOSIS — M722 Plantar fascial fibromatosis: Secondary | ICD-10-CM

## 2019-08-04 DIAGNOSIS — E876 Hypokalemia: Secondary | ICD-10-CM | POA: Diagnosis not present

## 2019-08-04 DIAGNOSIS — F329 Major depressive disorder, single episode, unspecified: Secondary | ICD-10-CM | POA: Insufficient documentation

## 2019-08-04 DIAGNOSIS — Z8673 Personal history of transient ischemic attack (TIA), and cerebral infarction without residual deficits: Secondary | ICD-10-CM | POA: Insufficient documentation

## 2019-08-04 DIAGNOSIS — G9341 Metabolic encephalopathy: Secondary | ICD-10-CM | POA: Diagnosis not present

## 2019-08-04 DIAGNOSIS — E785 Hyperlipidemia, unspecified: Secondary | ICD-10-CM | POA: Diagnosis not present

## 2019-08-04 DIAGNOSIS — G4733 Obstructive sleep apnea (adult) (pediatric): Secondary | ICD-10-CM | POA: Insufficient documentation

## 2019-08-04 DIAGNOSIS — R4182 Altered mental status, unspecified: Secondary | ICD-10-CM

## 2019-08-04 DIAGNOSIS — J449 Chronic obstructive pulmonary disease, unspecified: Secondary | ICD-10-CM | POA: Insufficient documentation

## 2019-08-04 DIAGNOSIS — Z7902 Long term (current) use of antithrombotics/antiplatelets: Secondary | ICD-10-CM | POA: Insufficient documentation

## 2019-08-04 DIAGNOSIS — G9340 Encephalopathy, unspecified: Secondary | ICD-10-CM | POA: Diagnosis present

## 2019-08-04 DIAGNOSIS — Z7951 Long term (current) use of inhaled steroids: Secondary | ICD-10-CM | POA: Diagnosis not present

## 2019-08-04 DIAGNOSIS — M79671 Pain in right foot: Secondary | ICD-10-CM

## 2019-08-04 DIAGNOSIS — G629 Polyneuropathy, unspecified: Secondary | ICD-10-CM | POA: Insufficient documentation

## 2019-08-04 DIAGNOSIS — S2241XD Multiple fractures of ribs, right side, subsequent encounter for fracture with routine healing: Secondary | ICD-10-CM

## 2019-08-04 DIAGNOSIS — Z720 Tobacco use: Secondary | ICD-10-CM | POA: Diagnosis present

## 2019-08-04 DIAGNOSIS — F419 Anxiety disorder, unspecified: Secondary | ICD-10-CM | POA: Insufficient documentation

## 2019-08-04 DIAGNOSIS — S2241XA Multiple fractures of ribs, right side, initial encounter for closed fracture: Secondary | ICD-10-CM | POA: Insufficient documentation

## 2019-08-04 DIAGNOSIS — F17210 Nicotine dependence, cigarettes, uncomplicated: Secondary | ICD-10-CM | POA: Diagnosis not present

## 2019-08-04 DIAGNOSIS — R569 Unspecified convulsions: Secondary | ICD-10-CM

## 2019-08-04 DIAGNOSIS — G40909 Epilepsy, unspecified, not intractable, without status epilepticus: Secondary | ICD-10-CM

## 2019-08-04 LAB — COMPREHENSIVE METABOLIC PANEL
ALT: 17 U/L (ref 0–44)
AST: 24 U/L (ref 15–41)
Albumin: 4.2 g/dL (ref 3.5–5.0)
Alkaline Phosphatase: 69 U/L (ref 38–126)
Anion gap: 11 (ref 5–15)
BUN: 12 mg/dL (ref 8–23)
CO2: 26 mmol/L (ref 22–32)
Calcium: 9.5 mg/dL (ref 8.9–10.3)
Chloride: 104 mmol/L (ref 98–111)
Creatinine, Ser: 0.71 mg/dL (ref 0.44–1.00)
GFR calc Af Amer: >60 mL/min (ref >60)
GFR calc non Af Amer: >60 mL/min (ref >60)
Glucose, Bld: 116 mg/dL — ABNORMAL HIGH (ref 70–99)
Potassium: 3.2 mmol/L — ABNORMAL LOW (ref 3.5–5.1)
Sodium: 141 mmol/L (ref 135–145)
Total Bilirubin: 0.6 mg/dL (ref 0.3–1.2)
Total Protein: 7.1 g/dL (ref 6.5–8.1)

## 2019-08-04 LAB — URINALYSIS, COMPLETE (UACMP) WITH MICROSCOPIC
Bacteria, UA: NONE SEEN
Bilirubin Urine: NEGATIVE
Glucose, UA: NEGATIVE mg/dL
Ketones, ur: 5 mg/dL — AB
Leukocytes,Ua: NEGATIVE
Nitrite: NEGATIVE
Protein, ur: NEGATIVE mg/dL
Specific Gravity, Urine: 1.014 (ref 1.005–1.030)
pH: 7 (ref 5.0–8.0)

## 2019-08-04 LAB — CBC WITH DIFFERENTIAL/PLATELET
Abs Immature Granulocytes: 0.02 10*3/uL (ref 0.00–0.07)
Basophils Absolute: 0 10*3/uL (ref 0.0–0.1)
Basophils Relative: 0 %
Eosinophils Absolute: 0.1 10*3/uL (ref 0.0–0.5)
Eosinophils Relative: 1 %
HCT: 37.9 % (ref 36.0–46.0)
Hemoglobin: 12.9 g/dL (ref 12.0–15.0)
Immature Granulocytes: 0 %
Lymphocytes Relative: 20 %
Lymphs Abs: 0.9 10*3/uL (ref 0.7–4.0)
MCH: 34.3 pg — ABNORMAL HIGH (ref 26.0–34.0)
MCHC: 34 g/dL (ref 30.0–36.0)
MCV: 100.8 fL — ABNORMAL HIGH (ref 80.0–100.0)
Monocytes Absolute: 0.4 10*3/uL (ref 0.1–1.0)
Monocytes Relative: 9 %
Neutro Abs: 3.1 10*3/uL (ref 1.7–7.7)
Neutrophils Relative %: 70 %
Platelets: 203 10*3/uL (ref 150–400)
RBC: 3.76 MIL/uL — ABNORMAL LOW (ref 3.87–5.11)
RDW: 14 % (ref 11.5–15.5)
WBC: 4.5 10*3/uL (ref 4.0–10.5)
nRBC: 0 % (ref 0.0–0.2)

## 2019-08-04 LAB — PHENYTOIN LEVEL, TOTAL: Phenytoin Lvl: 12.2 ug/mL (ref 10.0–20.0)

## 2019-08-04 LAB — MRSA PCR SCREENING: MRSA by PCR: NEGATIVE

## 2019-08-04 LAB — SARS CORONAVIRUS 2 (TAT 6-24 HRS): SARS Coronavirus 2: POSITIVE — AB

## 2019-08-04 MED ORDER — DULOXETINE HCL 30 MG PO CPEP
60.0000 mg | ORAL_CAPSULE | Freq: Every day | ORAL | Status: DC
  Administered 2019-08-04 – 2019-08-05 (×2): 60 mg via ORAL
  Filled 2019-08-04 (×2): qty 2

## 2019-08-04 MED ORDER — CLOPIDOGREL BISULFATE 75 MG PO TABS
75.0000 mg | ORAL_TABLET | Freq: Every day | ORAL | Status: DC
  Administered 2019-08-05: 75 mg via ORAL
  Filled 2019-08-04: qty 1

## 2019-08-04 MED ORDER — SODIUM CHLORIDE 0.9 % IV SOLN
75.0000 mL/h | INTRAVENOUS | Status: DC
  Administered 2019-08-04 – 2019-08-05 (×4): 75 mL/h via INTRAVENOUS

## 2019-08-04 MED ORDER — BETAMETHASONE SOD PHOS & ACET 6 (3-3) MG/ML IJ SUSP
3.0000 mg | Freq: Once | INTRAMUSCULAR | Status: DC
  Filled 2019-08-04 (×2): qty 0.5

## 2019-08-04 MED ORDER — ALBUTEROL SULFATE (2.5 MG/3ML) 0.083% IN NEBU: 2.5000 mg | INHALATION_SOLUTION | RESPIRATORY_TRACT | Status: DC | PRN

## 2019-08-04 MED ORDER — POTASSIUM CHLORIDE 10 MEQ/100ML IV SOLN
10.0000 meq | INTRAVENOUS | Status: AC
  Administered 2019-08-04 (×2): 10 meq via INTRAVENOUS
  Filled 2019-08-04 (×2): qty 100

## 2019-08-04 MED ORDER — LEVETIRACETAM 750 MG PO TABS
750.0000 mg | ORAL_TABLET | Freq: Every day | ORAL | Status: DC
  Administered 2019-08-04: 23:00:00 750 mg via ORAL
  Filled 2019-08-04 (×2): qty 1

## 2019-08-04 MED ORDER — SUCRALFATE 1 G PO TABS
1.0000 g | ORAL_TABLET | Freq: Four times a day (QID) | ORAL | Status: DC
  Administered 2019-08-04 – 2019-08-05 (×5): 1 g via ORAL
  Filled 2019-08-04 (×6): qty 1

## 2019-08-04 MED ORDER — LORAZEPAM 2 MG/ML IJ SOLN: 1.0000 mg | INTRAMUSCULAR | Status: DC | PRN

## 2019-08-04 MED ORDER — ALUM & MAG HYDROXIDE-SIMETH 200-200-20 MG/5ML PO SUSP: 30.0000 mL | Freq: Four times a day (QID) | ORAL | Status: DC | PRN

## 2019-08-04 MED ORDER — LOPERAMIDE HCL 2 MG PO CAPS: 4.0000 mg | ORAL_CAPSULE | ORAL | Status: DC | PRN

## 2019-08-04 MED ORDER — GABAPENTIN 100 MG PO CAPS
200.0000 mg | ORAL_CAPSULE | Freq: Two times a day (BID) | ORAL | Status: DC
  Administered 2019-08-04 – 2019-08-05 (×2): 200 mg via ORAL
  Filled 2019-08-04 (×2): qty 2

## 2019-08-04 MED ORDER — LACOSAMIDE 50 MG PO TABS
150.0000 mg | ORAL_TABLET | Freq: Two times a day (BID) | ORAL | Status: DC
  Administered 2019-08-04 – 2019-08-05 (×2): 150 mg via ORAL
  Filled 2019-08-04 (×2): qty 3

## 2019-08-04 MED ORDER — MAGNESIUM HYDROXIDE 400 MG/5ML PO SUSP: 30.0000 mL | Freq: Every day | ORAL | Status: DC | PRN

## 2019-08-04 MED ORDER — ALBUTEROL SULFATE HFA 108 (90 BASE) MCG/ACT IN AERS: 2.0000 | INHALATION_SPRAY | RESPIRATORY_TRACT | Status: DC | PRN

## 2019-08-04 MED ORDER — SODIUM CHLORIDE 0.9 % IV SOLN
INTRAVENOUS | Status: DC | PRN
  Administered 2019-08-04: 200 mL via INTRAVENOUS

## 2019-08-04 MED ORDER — LAMOTRIGINE 100 MG PO TABS: 200.0000 mg | ORAL_TABLET | Freq: Every day | ORAL | Status: DC

## 2019-08-04 MED ORDER — LEVETIRACETAM 500 MG PO TABS
500.0000 mg | ORAL_TABLET | Freq: Every day | ORAL | Status: DC
  Administered 2019-08-05: 500 mg via ORAL
  Filled 2019-08-04: qty 1

## 2019-08-04 MED ORDER — FERROUS SULFATE 325 (65 FE) MG PO TABS
325.0000 mg | ORAL_TABLET | Freq: Three times a day (TID) | ORAL | Status: DC
  Administered 2019-08-04 – 2019-08-05 (×2): 325 mg via ORAL
  Filled 2019-08-04 (×2): qty 1

## 2019-08-04 MED ORDER — MOMETASONE FURO-FORMOTEROL FUM 100-5 MCG/ACT IN AERO
2.0000 | INHALATION_SPRAY | Freq: Two times a day (BID) | RESPIRATORY_TRACT | Status: DC
  Administered 2019-08-04 – 2019-08-05 (×2): 2 via RESPIRATORY_TRACT
  Filled 2019-08-04: qty 8.8

## 2019-08-04 MED ORDER — BETAMETHASONE SOD PHOS & ACET 6 (3-3) MG/ML IJ SUSP
12.0000 mg | Freq: Once | INTRAMUSCULAR | Status: DC
  Filled 2019-08-04: qty 2

## 2019-08-04 MED ORDER — CLONAZEPAM 0.5 MG PO TABS
0.5000 mg | ORAL_TABLET | Freq: Three times a day (TID) | ORAL | Status: DC
  Administered 2019-08-04 – 2019-08-05 (×3): 0.5 mg via ORAL
  Filled 2019-08-04 (×3): qty 1

## 2019-08-04 MED ORDER — PHENYTOIN SODIUM EXTENDED 100 MG PO CAPS
100.0000 mg | ORAL_CAPSULE | Freq: Two times a day (BID) | ORAL | Status: DC
  Administered 2019-08-04 – 2019-08-05 (×2): 100 mg via ORAL
  Filled 2019-08-04 (×4): qty 1

## 2019-08-04 MED ORDER — LAMOTRIGINE 100 MG PO TABS
400.0000 mg | ORAL_TABLET | ORAL | Status: DC
  Administered 2019-08-05: 400 mg via ORAL
  Filled 2019-08-04: qty 4

## 2019-08-04 MED ORDER — DIPHENHYDRAMINE HCL 25 MG PO TABS
25.0000 mg | ORAL_TABLET | Freq: Four times a day (QID) | ORAL | Status: DC | PRN
  Filled 2019-08-04: qty 1

## 2019-08-04 MED ORDER — LAMOTRIGINE 100 MG PO TABS: 200.0000 mg | ORAL_TABLET | Freq: Two times a day (BID) | ORAL | Status: DC

## 2019-08-04 MED ORDER — LAMOTRIGINE 100 MG PO TABS
200.0000 mg | ORAL_TABLET | ORAL | Status: DC
  Administered 2019-08-04: 200 mg via ORAL
  Filled 2019-08-04: qty 2

## 2019-08-04 MED ORDER — VITAMIN B-12 1000 MCG PO TABS
500.0000 ug | ORAL_TABLET | Freq: Every day | ORAL | Status: DC
  Administered 2019-08-04 – 2019-08-05 (×2): 500 ug via ORAL
  Filled 2019-08-04 (×3): qty 1

## 2019-08-04 MED ORDER — ENOXAPARIN SODIUM 40 MG/0.4ML ~~LOC~~ SOLN
40.0000 mg | SUBCUTANEOUS | Status: DC
  Administered 2019-08-04: 40 mg via SUBCUTANEOUS
  Filled 2019-08-04: qty 0.4

## 2019-08-04 MED ORDER — LEVETIRACETAM 500 MG PO TABS: 500.0000 mg | ORAL_TABLET | Freq: Two times a day (BID) | ORAL | Status: DC

## 2019-08-04 MED ORDER — QUETIAPINE FUMARATE 25 MG PO TABS
25.0000 mg | ORAL_TABLET | Freq: Every day | ORAL | Status: DC
  Administered 2019-08-04: 25 mg via ORAL
  Filled 2019-08-04: qty 1

## 2019-08-04 MED ORDER — PANTOPRAZOLE SODIUM 40 MG PO TBEC
40.0000 mg | DELAYED_RELEASE_TABLET | Freq: Every day | ORAL | Status: DC
  Administered 2019-08-05: 10:00:00 40 mg via ORAL
  Filled 2019-08-04: qty 1

## 2019-08-04 MED ORDER — ATORVASTATIN CALCIUM 10 MG PO TABS
10.0000 mg | ORAL_TABLET | Freq: Every day | ORAL | Status: DC
  Administered 2019-08-04: 10 mg via ORAL
  Filled 2019-08-04: qty 1

## 2019-08-04 NOTE — ED Triage Notes (Signed)
Pt comes via [facility] from [facility] following a seizure. Pt arrives AMS. Pt able to state pain in knees, neck and legs.   EMS reports facility stated pt has seizure like activity. VSS, [id number]  MD at bedside

## 2019-08-04 NOTE — Progress Notes (Signed)
[place] visited pt. per OR for AD education; pt. in bed watching TV when [facility] arrived.  Pt. appeared slightly confused and sometimes had some trouble forming her thoughts into words.  Pt. says she lives in something similar to a SNF; [place] discussed AD w/pt. and left document w/her for further perusal; pt. could not think of a person she would  like to make HCPoA at this time.  CH provided prayer for pt.'s treatment at end of visit.  [place] will follow up if necessary and remains available.        08/04/19 1330  Clinical Encounter Type  Visited With Patient  Visit Type Initial (Advanced Directive Education)  Referral From Nurse  Spiritual Encounters  Spiritual Needs Prayer;Emotional  Stress Factors  Patient Stress Factors Major life changes;Health changes

## 2019-08-04 NOTE — ED Notes (Signed)
Pt taken to Xray.

## 2019-08-04 NOTE — H&P (Signed)
History and Physical    Marcia Brown [id number] DOB: 1955-08-21 DOA: 08/04/2019   PCP: Orvis Brill, [facility]    Patient coming from: [facility]  Chief Complaint: Seizures.    HPI: Marcia Brown is a 64 y.o. female with medical history significant of seizure disorder seen yesterday for fall and diagnosed with 3 rib fractures on the right side and admission was offered but declined.  She was given tramadol for pain control.  Patient today is seen in the ER for seizure activity, that was not witnessed however per report is concerned that patient is postictal.  Shunt was altered on initial presentation.  And is it alert and awake currently.  X-ray shows patient is allergic to Cipro and aspirin.  Patient is a current smoker no alcohol or drugs.  ED Course:  Blood pressure 139/75, pulse 65, temperature 97.8 F (36.6 C), temperature source Oral, resp. rate 18, height 5\' 8"  (1.727 m), weight 82.7 kg, SpO2 99 %.  Initial labs showed normal CBC with a hemoglobin of 12.9, macrocytosis at 100.8 otherwise normal. CMP showed mild hypokalemia with a potassium of 3.2 otherwise normal with a creatinine of 0.71 and a GFR of more than 60.  Mildly elevated glucose is noted level of 116. CT of the head showed no evidence of any acute intracranial abnormality, no cervical spine fracture or subluxation, moderate degenerative changes in the C-spine.  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  Minimal small vessel change in the hemispheric white matter.  Review of Systems: As per HPI otherwise 10 point review of systems negative.    Past Medical History:  Diagnosis Date  . Anxiety   . Asthma   . Cerebral ischemia   . COPD (chronic obstructive pulmonary disease) ([place])   . Depression   . High cholesterol   . Polyneuropathy   . Seizures ([place])   . Sleep apnea     Past Surgical History:  Procedure Laterality  Date  . ESOPHAGEAL DILATION    . FOOT SURGERY Right      reports that she has been smoking cigarettes. She has been smoking about 0.50 packs per day. She has never used smokeless tobacco. She reports that she does not drink alcohol or use drugs.  Allergies  Allergen Reactions  . Ciprofloxacin Other (See Comments)    Other Reaction: Lowered seizure threshold  . Aspirin Other (See Comments)    Reaction: Unknown    Family History  Problem Relation Age of Onset  . Breast cancer Daughter 60    Prior to Admission medications   Medication Sig Start Date End Date Taking? Authorizing Provider  albuterol (PROVENTIL HFA;VENTOLIN HFA) 108 (90 BASE) MCG/ACT inhaler Inhale 2 puffs into the lungs every 4 (four) hours as needed for wheezing or shortness of breath.    [provider]  alum & mag hydroxide-simeth (ANTACID LIQUID) 200-200-20 MG/5ML suspension Take 30 mLs by mouth every 6 (six) hours as needed for indigestion or heartburn.    [provider]  atorvastatin (LIPITOR) 10 MG tablet Take 10 mg by mouth at bedtime.     [provider]  cholecalciferol (VITAMIN D) 1000 units tablet Take 2,000 Units by mouth daily. Give with Vitamin D3 400 units    [provider]  cholecalciferol (VITAMIN D) 400 UNITS TABS tablet Take 400 Units by mouth daily. Give with Vitamin D3 2000 units    [provider]  clonazePAM (KLONOPIN) 0.5 MG tablet Take 0.5 mg by mouth 3 (three) times daily.     [provider]  clopidogrel (PLAVIX) 75 MG tablet Take 75 mg by mouth daily. 06/19/19   [provider]  cyanocobalamin 500 MCG tablet Take 500 mcg by mouth daily.    [provider]  diclofenac sodium (VOLTAREN) 1 % GEL Apply 4 g topically 3 (three) times daily. Apply to both knees    [provider]  diphenhydrAMINE (BENADRYL) 25 MG tablet Take 25 mg by mouth every 6 (six) hours as needed for allergies.    [provider]    DULoxetine (CYMBALTA) 60 MG capsule Take 60 mg by mouth daily.    [provider]  ferrous sulfate 325 (65 FE) MG tablet Take 325 mg by mouth 3 (three) times daily with meals.    [provider]  gabapentin (NEURONTIN) 100 MG capsule Take 200 mg by mouth 2 (two) times daily.    [provider]  lamoTRIgine (LAMICTAL) 200 MG tablet Take 200-400 mg by mouth 2 (two) times daily. Pt takes 2 tablets (400 mg) in the morning on Monday, Wednesday and Friday and only 1 tablet (200 mg) bid on all other days in the morning.    [provider]  levETIRAcetam (KEPPRA) 500 MG tablet Take 500-750 mg by mouth 2 (two) times daily. Take one tablet (500) by mouth daily in the morning and one and one-half tablets (750 mg) in the evening 06/19/19   [provider]  loperamide (IMODIUM) 2 MG capsule Take 4 mg by mouth as needed for diarrhea or loose stools.    [provider]  magnesium hydroxide (MILK OF MAGNESIA) 400 MG/5ML suspension Take 30 mLs by mouth daily as needed for mild constipation or moderate constipation.    [provider]  mometasone-formoterol (DULERA) 100-5 MCG/ACT AERO Inhale 2 puffs into the lungs 2 (two) times daily.    [provider]  pantoprazole (PROTONIX) 40 MG tablet Take 40 mg by mouth daily before breakfast.     [provider]  phenytoin (DILANTIN) 100 MG ER capsule Take 100 mg by mouth 2 (two) times daily.     [provider]  QUEtiapine (SEROQUEL) 25 MG tablet Take 25 mg by mouth at bedtime. 06/19/19   [provider]  sucralfate (CARAFATE) 1 g tablet Take 1 g by mouth 4 (four) times daily. 06/19/19   [provider]  traMADol (ULTRAM) 50 MG tablet Take 1 tablet (50 mg total) by mouth every 6 (six) hours as needed for up to 7 days for moderate pain. 08/03/19 08/10/19  Arta Silence, MD  VIMPAT 150 MG TABS Take 1 tablet by mouth 2 (two) times daily. 06/03/19   [provider]     Physical Exam: Vitals:   08/04/19 1500 08/04/19 2245 08/05/19 0548 08/05/19 0832  BP:  (!) 163/74 (!) 143/74 139/75  Pulse:  73 68 65  Resp:  20 16 18   Temp:  98.7 F (37.1 C) 98.1 F (36.7 C) 97.8 F (36.6 C)  TempSrc:  Oral Oral Oral  SpO2:  100% 97% 99%  Weight: 82.7 kg     Height: 5\' 8"  (1.727 m)         Constitutional: NAD, calm, comfortable Vitals:   08/04/19 1500 08/04/19 2245 08/05/19 0548 08/05/19 0832  BP:  (!) 163/74 (!) 143/74 139/75  Pulse:  73 68 65  Resp:  20 16 18   Temp:  98.7 F (37.1 C) 98.1 F (36.7 C) 97.8 F (36.6 C)  TempSrc:  Oral Oral Oral  SpO2:  100% 97% 99%  Weight: 82.7 kg     Height: 5\' 8"  (1.727 m)      Eyes: PERRL, lids and conjunctivae normal ENMT: Mucous membranes are moist. Posterior pharynx clear of any exudate or lesions.Normal dentition.  Neck: normal, supple, no masses, no thyromegaly Respiratory: clear to auscultation bilaterally, no wheezing, no crackles. Normal respiratory effort. No accessory muscle use.  Cardiovascular: Regular rate and rhythm, no murmurs / rubs / gallops. No extremity edema. 2+ pedal pulses. No carotid bruits.  Abdomen: no tenderness, no masses palpated. No hepatosplenomegaly. Bowel sounds positive.  Musculoskeletal: no clubbing / cyanosis. No joint deformity upper and lower extremities. Good ROM, no contractures. Normal muscle tone.  Skin: no rashes, lesions, ulcers. No induration Neurologic: CN 2-12 grossly intact. Sensation intact, DTR normal. Strength 5/5 in all 4.  Psychiatric: flat affect. Labs on Admission: I have personally reviewed following labs and imaging studies  CBC: No results for input(s): WBC, NEUTROABS, HGB, HCT, MCV, PLT in the last 168 hours. Basic Metabolic Panel: No results for input(s): NA, K, CL, CO2, GLUCOSE, BUN, CREATININE, CALCIUM, MG, PHOS in the last 168 hours. GFR: Estimated Creatinine Clearance: 79 mL/min (by C-G formula based on SCr of 0.71 mg/dL). Liver Function  Tests: No results for input(s): AST, ALT, ALKPHOS, BILITOT, PROT, ALBUMIN in the last 168 hours. Urine analysis:    Component Value Date/Time   COLORURINE YELLOW (A) 08/04/2019 1620   APPEARANCEUR HAZY (A) 08/04/2019 1620   APPEARANCEUR Clear 03/18/2014 1912   LABSPEC 1.014 08/04/2019 1620   LABSPEC 1.004 03/18/2014 1912   PHURINE 7.0 08/04/2019 1620   GLUCOSEU NEGATIVE 08/04/2019 1620   GLUCOSEU Negative 03/18/2014 1912   HGBUR MODERATE (A) 08/04/2019 1620   BILIRUBINUR NEGATIVE 08/04/2019 1620   BILIRUBINUR Negative 03/18/2014 1912   KETONESUR 5 (A) 08/04/2019 1620   PROTEINUR NEGATIVE 08/04/2019 1620   UROBILINOGEN 0.2 11/06/2010 0900   NITRITE NEGATIVE 08/04/2019 1620   LEUKOCYTESUR NEGATIVE 08/04/2019 1620   LEUKOCYTESUR Negative 03/18/2014 1912     Radiological Exams on Admission: No results found.  EKG: Independently reviewed.  None. Assessment/Plan Principal Problem:   Seizure ([place]) Active Problems:   Tobacco abuse Seizures: Pt brought from facility due to seizures, attribute to med side effect. Consider neurology consult if recurrent or persistent.  Ativan prn and seizure precaution/ aspiration precaution. We will continue pt on dilantin/ vimpat/keppra and Lamictal.   Tobacco abuse: Counseling when appropriate and nicotine patch.    DVT prophylaxis: Lovenox Code Status: Full Family Communication: none Disposition Plan: SNF  Consults called: none Admission status: Observation.  Para Skeans MD [facility] If 7PM-7AM, please contact night-coverage [email or web address] Password [facility]  08/22/2019, 7:59 PM

## 2019-08-04 NOTE — Progress Notes (Deleted)
[place] visited pt. per OR for AD education; pt. in bed watching TV when [facility] arrived.  Pt. appeared slightly confused and sometimes had some trouble forming her thoughts into words.  Pt. says she lives in something similar to a SNF; [place] discussed AD w/pt. and left document w/her for further perusal; pt. could not think of a person she would  like to make HCPoA at this time.  CH provided prayer for pt.'s treatment at end of visit.  [facility] will follow up if necessary and remains available.        08/04/19 2200  Clinical Encounter Type  Visited With Patient  Visit Type Initial (Advanced Directive Education)  Referral From Nurse  Spiritual Encounters  Spiritual Needs Emotional;Prayer  Stress Factors  Patient Stress Factors Health changes;Family relationships

## 2019-08-04 NOTE — ED Notes (Signed)
Attempted to call floor for pt's room assignment. Per Secretary Rn is not ready for report she is tuck in another room Provided ascom number for RN to call this RN back

## 2019-08-04 NOTE — ED Provider Notes (Signed)
[facility] Emergency Department Provider Note   ____________________________________________   First MD Initiated Contact with Patient 08/04/19 0957     (approximate)  I have reviewed the triage vital signs and the nursing notes.   HISTORY  Chief Complaint Seizures and Altered Mental Status    HPI Marcia Brown is a 64 y.o. female with past medical history of seizures, COPD, stroke, and frequent falls who presents to the ED for altered mental status.  History is limited due to patient's altered mental status.  Patient resides at [facility] and EMS states that they had called out after patient was found confused.  She is reportedly alert and oriented x4 at baseline, does have a history of seizures where she will act erratically, rocking herself back and forth.  Staff did not witness any seizure activity, but was concerned the patient may be postictal.  Patient currently complains of pain in her neck and both legs diffusely, primarily at her knees.  She does states she has been dealing with pain in her legs "for a long time".  She was seen in the ED yesterday for a fall, diagnosed with 3 rib fractures on the right, offered admission at that time but declined and was prescribed tramadol.        Past Medical History:  Diagnosis Date  . Anxiety   . Asthma   . Cerebral ischemia   . COPD (chronic obstructive pulmonary disease) ([place])   . Depression   . High cholesterol   . Polyneuropathy   . Seizures ([place])   . Sleep apnea     Patient Active Problem List   Diagnosis Date Noted  . Seizures ([place]) 08/04/2019  . Acute lower UTI 06/26/2019  . Hypokalemia 06/26/2019  . AMS (altered mental status) 06/26/2019  . Tobacco abuse 12/27/2015  . Repeated falls 05/24/2015  . Seizure ([place]) 02/03/2015  . 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 ESOPHAGEAL DILATION    . FOOT SURGERY Right     Prior to Admission  medications   Medication Sig Start Date End Date Taking? Authorizing Provider  atorvastatin (LIPITOR) 10 MG tablet Take 10 mg by mouth at bedtime.    Yes [provider]  cholecalciferol (VITAMIN D) 1000 units tablet Take 2,000 Units by mouth daily. Give with Vitamin D3 400 units   Yes [provider]  cholecalciferol (VITAMIN D) 400 UNITS TABS tablet Take 400 Units by mouth daily. Give with Vitamin D3 2000 units   Yes [provider]  clonazePAM (KLONOPIN) 0.5 MG tablet Take 0.5 mg by mouth 3 (three) times daily.    Yes [provider]  clopidogrel (PLAVIX) 75 MG tablet Take 75 mg by mouth daily. 06/19/19  Yes [provider]  diclofenac sodium (VOLTAREN) 1 % GEL Apply 4 g topically 3 (three) times daily. Apply to both knees   Yes [provider]  DULoxetine (CYMBALTA) 60 MG capsule Take 60 mg by mouth daily.   Yes [provider]  ferrous sulfate 325 (65 FE) MG tablet Take 325 mg by mouth 3 (three) times daily with meals.   Yes [provider]  gabapentin (NEURONTIN) 100 MG capsule Take 200 mg by mouth 2 (two) times daily.   Yes [provider]  lamoTRIgine (LAMICTAL) 200 MG tablet Take 200-400 mg by mouth daily. Pt takes 2 tablets (400 mg) in the morning on Monday, Wednesday and Friday and only 1  tablet (200 mg) on all other days in the morning.   Yes [provider]  levETIRAcetam (KEPPRA) 500 MG tablet Take 500-750 mg by mouth 2 (two) times daily. Take one tablet (500) by mouth daily in the morning and one and one-half tablets (750 mg) in the evening 06/19/19  Yes [provider]  mometasone-formoterol (DULERA) 100-5 MCG/ACT AERO Inhale 2 puffs into the lungs 2 (two) times daily.   Yes [provider]  pantoprazole (PROTONIX) 40 MG tablet Take 40 mg by mouth daily before breakfast.    Yes [provider]  phenytoin (DILANTIN) 100 MG ER capsule Take 100 mg by mouth 2 (two) times daily.     Yes [provider]  QUEtiapine (SEROQUEL) 25 MG tablet Take 25 mg by mouth at bedtime. 06/19/19  Yes [provider]  sucralfate (CARAFATE) 1 g tablet Take 1 g by mouth 4 (four) times daily. 06/19/19  Yes [provider]  VIMPAT 150 MG TABS Take 1 tablet by mouth 2 (two) times daily. 06/03/19  Yes [provider]  albuterol (PROVENTIL HFA;VENTOLIN HFA) 108 (90 BASE) MCG/ACT inhaler Inhale 2 puffs into the lungs every 4 (four) hours as needed for wheezing or shortness of breath.    [provider]  alum & mag hydroxide-simeth (ANTACID LIQUID) 200-200-20 MG/5ML suspension Take 30 mLs by mouth every 6 (six) hours as needed for indigestion or heartburn.    [provider]  cyanocobalamin 500 MCG tablet Take 500 mcg by mouth daily.    [provider]  diphenhydrAMINE (BENADRYL) 25 MG tablet Take 25 mg by mouth every 6 (six) hours as needed for allergies.    [provider]  loperamide (IMODIUM) 2 MG capsule Take 4 mg by mouth as needed for diarrhea or loose stools.    [provider]  magnesium hydroxide (MILK OF MAGNESIA) 400 MG/5ML suspension Take 30 mLs by mouth daily as needed for mild constipation or moderate constipation.    [provider]    Allergies Ciprofloxacin and Aspirin  Family History  Problem Relation Age of Onset  . Breast cancer Daughter 5    Social History Social History   Tobacco Use  . Smoking status: Current Every Day Smoker    Packs/day: 0.50    Types: Cigarettes  . Smokeless tobacco: Never Used  Substance Use Topics  . Alcohol use: No  . Drug use: No    Review of Systems Unable to obtain secondary to altered mental status  ____________________________________________   PHYSICAL EXAM:  VITAL SIGNS: ED Triage Vitals  Enc Vitals Group     BP 08/04/19 0954 116/88     Pulse Rate 08/04/19 0954 77     Resp 08/04/19 0954 17     Temp 08/04/19 0954 98.4 F (36.9 C)      Temp src --      SpO2 08/04/19 0954 98 %     Weight 08/04/19 0952 180 lb (81.6 kg)     Height 08/04/19 0952 5\' 8"  (1.727 m)     Head Circumference --      Peak Flow --      Pain Score 08/04/19 0952 6     Pain Loc --      Pain Edu? --      Excl. in [place]? --     Constitutional: Alert and disoriented x4. Eyes: Conjunctivae are normal.  Pupils equal round and reactive to light bilaterally. Head: Atraumatic. Nose: No congestion/rhinnorhea. Mouth/Throat: Mucous membranes  are moist. Neck: Normal ROM Cardiovascular: Normal rate, regular rhythm. Grossly normal heart sounds. Respiratory: Normal respiratory effort.  No retractions. Lungs CTAB.  Tenderness to right chest wall. Gastrointestinal: Soft and nontender. No distention. Genitourinary: deferred Musculoskeletal: Diffuse tenderness to hips bilaterally as well as knees bilaterally, no obvious deformity.  2+ DP pulses bilaterally, 2+ radial pulses bilaterally. Neurologic: Confused with normal speech and language. No gross focal neurologic deficits are appreciated.  Skin:  Skin is warm, dry and intact. No rash noted. Psychiatric: Mood and affect are normal. Speech and behavior are normal.  ____________________________________________   LABS (all labs ordered are listed, but only abnormal results are displayed)  Labs Reviewed  CBC WITH DIFFERENTIAL/PLATELET - Abnormal; Notable for the following components:      Result Value   RBC 3.76 (*)    MCV 100.8 (*)    MCH 34.3 (*)    All other components within normal limits  COMPREHENSIVE METABOLIC PANEL - Abnormal; Notable for the following components:   Potassium 3.2 (*)    Glucose, Bld 116 (*)    All other components within normal limits  MRSA PCR SCREENING  SARS CORONAVIRUS 2 (TAT 6-24 HRS)  PHENYTOIN LEVEL, TOTAL  URINALYSIS, COMPLETE (UACMP) WITH MICROSCOPIC   ____________________________________________  EKG  ED ECG REPORT I, Blake Divine, the attending physician, personally  viewed and interpreted this ECG.   Date: 08/04/2019  EKG Time: 9:55  Rate: 73  Rhythm: normal sinus rhythm  Axis: Normal  Intervals:none  ST&T Change: None   PROCEDURES  Procedure(s) performed (including Critical Care):  Procedures   ____________________________________________   INITIAL IMPRESSION / ASSESSMENT AND PLAN / ED COURSE       64 year old female presents to the ED for altered mental status noted at her nursing facility earlier today.  She was seen in the ED yesterday following a fall and diagnosed with right-sided rib fractures, subsequently prescribed tramadol, which has the potential to lower her seizure threshold.  It is possible that she had a seizure today and is now postictal, no obvious signs of infectious process but will screen chest x-ray and UA.  Given her recent trauma and change in mental status, also plan to check CT head and C-spine.  She is tender to both hips and knees, seems to state that this pain is chronic but we will further assess with x-ray.  She is neurovascularly intact to her bilateral lower extremities.  CT head and C-spine negative for acute process, x-rays are unremarkable outside of redemonstrating known right-sided rib fractures with no change from yesterday.  Lab work is unremarkable and patient's mental status is gradually improving, now she is oriented to self but not place or time.  We will admit for further management of altered mental status and rib fractures.      ____________________________________________   FINAL CLINICAL IMPRESSION(S) / ED DIAGNOSES  Final diagnoses:  Altered mental status, unspecified altered mental status type  Seizure disorder ([place])  Closed fracture of multiple ribs of right side with routine healing, subsequent encounter     ED Discharge Orders    None       Note:  This document was prepared using Dragon voice recognition software and may include unintentional dictation errors.   Blake Divine, MD 08/04/19 1539

## 2019-08-05 ENCOUNTER — Observation Stay: Payer: Medicare Other

## 2019-08-05 DIAGNOSIS — R569 Unspecified convulsions: Secondary | ICD-10-CM | POA: Diagnosis not present

## 2019-08-05 DIAGNOSIS — G9341 Metabolic encephalopathy: Secondary | ICD-10-CM | POA: Diagnosis not present

## 2019-08-05 LAB — MAGNESIUM: Magnesium: 2 mg/dL (ref 1.7–2.4)

## 2019-08-05 MED ORDER — OXYCODONE HCL 5 MG PO TABS: 2.5000 mg | ORAL_TABLET | Freq: Four times a day (QID) | ORAL | 0 refills | Status: AC | PRN

## 2019-08-05 MED ORDER — ACETAMINOPHEN 500 MG PO TABS: 1000.0000 mg | ORAL_TABLET | Freq: Three times a day (TID) | ORAL | 0 refills | Status: AC

## 2019-08-05 MED ORDER — ALBUTEROL SULFATE HFA 108 (90 BASE) MCG/ACT IN AERS
2.0000 | INHALATION_SPRAY | RESPIRATORY_TRACT | Status: DC | PRN
  Filled 2019-08-05: qty 6.7

## 2019-08-05 MED ORDER — ADULT MULTIVITAMIN W/MINERALS CH: 1.0000 | ORAL_TABLET | Freq: Every day | ORAL | Status: DC

## 2019-08-05 MED ORDER — ALBUTEROL SULFATE HFA 108 (90 BASE) MCG/ACT IN AERS: 2.0000 | INHALATION_SPRAY | RESPIRATORY_TRACT | Status: DC | PRN

## 2019-08-05 MED ORDER — ENSURE ENLIVE PO LIQD
237.0000 mL | Freq: Two times a day (BID) | ORAL | Status: DC
  Administered 2019-08-05: 15:00:00 237 mL via ORAL

## 2019-08-05 NOTE — Progress Notes (Signed)
Discussed case with Dr. Loleta Books, agree since patient tested positive in December for Covid-19 at an outside facility, testing within 3 months of first positive is not recommended since tests will continue to be positive but will not reflect infectivity. Patient can come off isolation per the [facility] and [place] isolation guidelines.

## 2019-08-05 NOTE — Progress Notes (Signed)
Pt is confused, alert to self. She pulled out her IV earlier in the shift, and continues to remove her tele leads and safety mitts. NP notified. Pt currently has tele sitter in the room.

## 2019-08-05 NOTE — Progress Notes (Signed)
Initial Nutrition Assessment  DOCUMENTATION CODES:   Not applicable  INTERVENTION:   Ensure Enlive po BID, each supplement provides 350 kcal and 20 grams of protein  MVI daily   NUTRITION DIAGNOSIS:   Increased nutrient needs related to chronic illness(COPD) as evidenced by increased estimated needs.  GOAL:   Patient will meet greater than or equal to 90% of their needs  MONITOR:   PO intake, Supplement acceptance, Labs, Weight trends, Skin, I & O's  REASON FOR ASSESSMENT:   Malnutrition Screening Tool    ASSESSMENT:   64 y.o. female with past medical history of seizures, COPD, stroke, COVID 19, esophageal dilation and frequent falls who presents to the ED for altered mental status.  RD working remotely.  Unable to speak with pt r/t AMS. Per chart, pt eating 75% of her meals in hospital. RD will add supplements and MVI to help pt meet her estimated needs. Per chart, pt appears to have lost 18lbs(9%) since December. RD suspects weight loss is r/t recent COVID 19 infection. Can consider liberalizing pt's diet if oral intake does not improve.   Medications reviewed and include: plavix, lovenox, ferrous sulfate, protonix, carafate, B12, NaCl @75ml /hr  Labs reviewed: K 3.2(L)  Unable to complete Nutrition-Focused physical exam at this time.   Diet Order:   Diet Order            Diet Heart Room service appropriate? Yes; Fluid consistency: Thin  Diet effective now             EDUCATION NEEDS:   No education needs have been identified at this time  Skin:  Skin Assessment: Reviewed RN Assessment(ecchymosis)  Last BM:  pta  Height:   Ht Readings from Last 1 Encounters:  08/04/19 5\' 8"  (1.727 m)    Weight:   Wt Readings from Last 1 Encounters:  08/04/19 82.7 kg    Ideal Body Weight:  63.6 kg  BMI:  Body mass index is 27.72 kg/m.  Estimated Nutritional Needs:   Kcal:  1700-2000kcal/day  Protein:  85-100g/day  Fluid:  >1.6L/day  Koleen Distance  MS, RD, LDN Contact information available in Amion

## 2019-08-05 NOTE — NC FL2 (Signed)
[place] LEVEL OF CARE SCREENING TOOL     IDENTIFICATION  Patient Name: Marcia Brown Birthdate: May 26, 1956 Sex: female Admission Date (Current Location): 08/04/2019  [facility] and [place] Number:  Engineering geologist and Address:         Provider Number: [id number]  Attending Physician Name and Address:  Edwin Dada, *  Relative Name and Phone Number:       Current Level of Care: Hospital Recommended Level of Care: [place] Prior Approval Number:    Date Approved/Denied:   PASRR Number:    Discharge Plan: Other (Comment)(ALF)    Current Diagnoses: Patient Active Problem List   Diagnosis Date Noted  . Seizures ([place]) 08/04/2019  . Acute lower UTI 06/26/2019  . Hypokalemia 06/26/2019  . AMS (altered mental status) 06/26/2019  . Tobacco abuse 12/27/2015  . Repeated falls 05/24/2015  . Seizure ([place]) 02/03/2015  . 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    Orientation RESPIRATION BLADDER Height & Weight     Self, Place  Normal Continent Weight: 82.7 kg Height:  5\' 8"  (172.7 cm)  BEHAVIORAL SYMPTOMS/MOOD NEUROLOGICAL BOWEL NUTRITION STATUS      Continent Diet(Heart Healthy)  AMBULATORY STATUS COMMUNICATION OF NEEDS Skin   Supervision Verbally Skin abrasions, Bruising                       Personal Care Assistance Level of Assistance              Functional Limitations Info             SPECIAL CARE FACTORS FREQUENCY                       Contractures Contractures Info: Not present    Additional Factors Info  Code Status, Allergies Code Status Info: Full Allergies Info: Ciproflacin, aspirin           Medication List    TAKE these medications   acetaminophen 500 MG tablet Commonly known as: TYLENOL Take 2 tablets (1,000 mg total) by mouth in the morning, at noon, and at bedtime for 7 days.   albuterol 108 (90 Base) MCG/ACT inhaler Commonly known as: VENTOLIN HFA Inhale 2 puffs into the lungs  every 4 (four) hours as needed for wheezing or shortness of breath.   Antacid Liquid [id number] MG/5ML suspension Generic drug: alum & mag hydroxide-simeth Take 30 mLs by mouth every 6 (six) hours as needed for indigestion or heartburn.   atorvastatin 10 MG tablet Commonly known as: LIPITOR Take 10 mg by mouth at bedtime.   cholecalciferol 10 MCG (400 UNIT) Tabs tablet Commonly known as: VITAMIN D3 Take 400 Units by mouth daily. Give with Vitamin D3 2000 units   cholecalciferol 1000 units tablet Commonly known as: VITAMIN D Take 2,000 Units by mouth daily. Give with Vitamin D3 400 units   clonazePAM 0.5 MG tablet Commonly known as: KLONOPIN Take 0.5 mg by mouth 3 (three) times daily.   clopidogrel 75 MG tablet Commonly known as: PLAVIX Take 75 mg by mouth daily.   diclofenac sodium 1 % Gel Commonly known as: VOLTAREN Apply 4 g topically 3 (three) times daily. Apply to both knees   diphenhydrAMINE 25 MG tablet Commonly known as: BENADRYL Take 25 mg by mouth every 6 (six) hours as needed for allergies.   DULoxetine 60 MG capsule Commonly known as: CYMBALTA Take 60 mg by mouth daily.   ferrous sulfate 325 (65 FE)  MG tablet Take 325 mg by mouth 3 (three) times daily with meals.   gabapentin 100 MG capsule Commonly known as: NEURONTIN Take 200 mg by mouth 2 (two) times daily.   lamoTRIgine 200 MG tablet Commonly known as: LAMICTAL Take 200-400 mg by mouth daily. Pt takes 2 tablets (400 mg) in the morning on Monday, Wednesday and Friday and only 1 tablet (200 mg) on all other days in the morning.   levETIRAcetam 500 MG tablet Commonly known as: KEPPRA Take 500-750 mg by mouth 2 (two) times daily. Take one tablet (500) by mouth daily in the morning and one and one-half tablets (750 mg) in the evening   loperamide 2 MG capsule Commonly known as: IMODIUM Take 4 mg by mouth as needed for diarrhea or loose stools.   magnesium hydroxide 400 MG/5ML  suspension Commonly known as: MILK OF MAGNESIA Take 30 mLs by mouth daily as needed for mild constipation or moderate constipation.   mometasone-formoterol 100-5 MCG/ACT Aero Commonly known as: DULERA Inhale 2 puffs into the lungs 2 (two) times daily.   oxyCODONE 5 MG immediate release tablet Commonly known as: Roxicodone Take 0.5 tablets (2.5 mg total) by mouth every 6 (six) hours as needed for up to 5 days for moderate pain.   pantoprazole 40 MG tablet Commonly known as: PROTONIX Take 40 mg by mouth daily before breakfast.   phenytoin 100 MG ER capsule Commonly known as: DILANTIN Take 100 mg by mouth 2 (two) times daily.   QUEtiapine 25 MG tablet Commonly known as: SEROQUEL Take 25 mg by mouth at bedtime.   sucralfate 1 g tablet Commonly known as: CARAFATE Take 1 g by mouth 4 (four) times daily.   Vimpat 150 MG Tabs Generic drug: Lacosamide Take 1 tablet by mouth 2 (two) times daily.   vitamin B-12 500 MCG tablet Commonly known as: CYANOCOBALAMIN Take 500 mcg by mouth daily.   Relevant Imaging Results:  Relevant Lab Results:   Additional Information SSN [id number]  Beverly Sessions, RN

## 2019-08-05 NOTE — Progress Notes (Signed)
Notified NP that pt's COVID result was positive. Pt will stay on the unit per NP, and begin on airborne/contact isolation. Pt transferred to room 222.

## 2019-08-05 NOTE — Care Management Obs Status (Signed)
[place] NOTIFICATION   Patient Details  Name: Marcia Brown MRN: [id number] Date of Birth: 1956-02-03   Medicare Observation Status Notification Given:  Yes Verbally by phone with niece Kendrick Fries.     Shelbie Ammons, RN 08/05/2019, 1:40 PM

## 2019-08-05 NOTE — TOC Initial Note (Signed)
Transition of Care [facility]) - Initial/Assessment Note    Patient Details  Name: Marcia Brown MRN: [id number] Date of Birth: 1956-02-09  [facility] CM/SW Contact:    Beverly Sessions, RN Phone Number: 08/05/2019, 2:34 PM  Clinical Narrative:                      Patient admitted from [facility] confirmed with Rachel Bo at the [facility] that patient can return today.  They will provide transportation back to the facility at 230  Fl2 completed.  Faxed to [place] at the [facility].   RN to send hard copy of script and fl2 with the patient at discharge    Patient Goals and CMS Choice        Expected Discharge Plan and Services           Expected Discharge Date: 08/05/19                                    Prior Living Arrangements/Services                       Activities of Daily Living Home Assistive Devices/Equipment: Gilford Rile (specify type) ADL Screening (condition at time of admission) Patient's cognitive ability adequate to safely complete daily activities?: Yes Is the patient deaf or have difficulty hearing?: No Does the patient have difficulty seeing, even when wearing glasses/contacts?: No Does the patient have difficulty concentrating, remembering, or making decisions?: No Patient able to express need for assistance with ADLs?: Yes Does the patient have difficulty dressing or bathing?: No Independently performs ADLs?: Yes (appropriate for developmental age) Does the patient have difficulty walking or climbing stairs?: No Weakness of Legs: Both Weakness of Arms/Hands: Both  Permission Sought/Granted                  Emotional Assessment              Admission diagnosis:  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 Seizures ([place]) 08/04/2019  . Acute lower UTI 06/26/2019  . Hypokalemia 06/26/2019  . AMS (altered mental status) 06/26/2019  . Tobacco abuse 12/27/2015  . Repeated falls 05/24/2015  . Seizure ([place]) 02/03/2015  . 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

## 2019-08-05 NOTE — Discharge Summary (Signed)
Physician Discharge Summary  Marcia Brown [id number] DOB: January 27, 1956 DOA: 08/04/2019  PCP: Orvis Brill, [facility]  Admit date: 08/04/2019 Discharge date: 08/05/2019  Admitted From: SNF  Disposition:  SNF   Recommendations for Outpatient Follow-up:  1. Follow up with primary Neurologist Dr. Melrose Nakayama in 4-6 weeks  2. For rib fractures:  1. Use incentive spirometer 3 times daily and take acetaminophen 1000 mg three times dialy for 1 week 2. Subsequently reduce acetaminophen to as needed     Home Health: None  Equipment/Devices: Incentive spirometer  Discharge Condition: Fair  CODE STATUS: FULL Diet recommendation: Cardiac  Brief/Interim Summary: Marcia Brown is a 64 y.o. F with hx CVA without residual deficits, chronic right facial droop, COPD not on O2, seizures, OSA and living in assisted living who presented with altered mentation again.  Patient had had a fall day before admission, sent to ER, found to have rib fractures, sent home.  Today, appeared altered, sent back to ER.  In the ER, there was some concern for an unwitnessed seizure due to tramadol's seizure threshold-lowering effects and so the patient was admitted for observation.        PRINCIPAL HOSPITAL DIAGNOSIS: Altered mental status    Discharge Diagnoses:   Acute metabolic encephalopathy Patient was observed overnight.  She was evaluated by Neurology who felt that seizure with post-ictal state was possible, not likely.  No adjustments to anti-epileptics recommended.  MRI brain obtained, showed old temporal lobe infarction, no acute changes.  Patient had returned to normal mentation by morning.  Follow up with Dr. Melrose Nakayama: was acute encephalopathy a side effect of Vimpat?      Recent COVID 19 Patient had a recent COVID infection.  She is outside the infectious window, has no symptoms attributable to reinfection.   Hypokalemia Mild.  K supplemented.  Rib fractures Recommend incentive spirometery  and pain control as above.  History CVA Continue atorvastatin, Plavix  COPD without exacerbation No exacerbation.  Continue ICS-LABA  OSA  History seizures Continue Vimpat, gabapentin, phenytoin, Keppra, lamotrigine.  EEG pending at time of discharge.  Please schedule with Dr. Melrose Nakayama in 2-4 weeks and follow up EEG at that time.  Mood disorder Continue clonazepam, duloxetine, Seroquel  Iron deficiency Continue iron  GERD  Continue PPI  B12 deficiency Continue B12  Chronic right facial droop No change            Discharge Instructions  Discharge Instructions    Discharge instructions   Complete by: As directed    For rib fractures: Take acetaminophen 1000 mg three times daily for 1 week then reduce to as needed Take oxycodone 2.5 mg up to every 6 hours as needed for breakthrough pain   Increase activity slowly   Complete by: As directed      Allergies as of 08/05/2019      Reactions   Ciprofloxacin Other (See Comments)   Other Reaction: Lowered seizure threshold   Aspirin Other (See Comments)   Reaction: Unknown      Medication List    TAKE these medications   acetaminophen 500 MG tablet Commonly known as: TYLENOL Take 2 tablets (1,000 mg total) by mouth in the morning, at noon, and at bedtime for 7 days.   albuterol 108 (90 Base) MCG/ACT inhaler Commonly known as: VENTOLIN HFA Inhale 2 puffs into the lungs every 4 (four) hours as needed for wheezing or shortness of breath.   Antacid Liquid 200-200-20 MG/5ML suspension Generic drug: alum & mag hydroxide-simeth Take  30 mLs by mouth every 6 (six) hours as needed for indigestion or heartburn.   atorvastatin 10 MG tablet Commonly known as: LIPITOR Take 10 mg by mouth at bedtime.   cholecalciferol 10 MCG (400 UNIT) Tabs tablet Commonly known as: VITAMIN D3 Take 400 Units by mouth daily. Give with Vitamin D3 2000 units   cholecalciferol 1000 units tablet Commonly known as: VITAMIN D Take 2,000  Units by mouth daily. Give with Vitamin D3 400 units   clonazePAM 0.5 MG tablet Commonly known as: KLONOPIN Take 0.5 mg by mouth 3 (three) times daily.   clopidogrel 75 MG tablet Commonly known as: PLAVIX Take 75 mg by mouth daily.   diclofenac sodium 1 % Gel Commonly known as: VOLTAREN Apply 4 g topically 3 (three) times daily. Apply to both knees   diphenhydrAMINE 25 MG tablet Commonly known as: BENADRYL Take 25 mg by mouth every 6 (six) hours as needed for allergies.   DULoxetine 60 MG capsule Commonly known as: CYMBALTA Take 60 mg by mouth daily.   ferrous sulfate 325 (65 FE) MG tablet Take 325 mg by mouth 3 (three) times daily with meals.   gabapentin 100 MG capsule Commonly known as: NEURONTIN Take 200 mg by mouth 2 (two) times daily.   lamoTRIgine 200 MG tablet Commonly known as: LAMICTAL Take 200-400 mg by mouth daily. Pt takes 2 tablets (400 mg) in the morning on Monday, Wednesday and Friday and only 1 tablet (200 mg) on all other days in the morning.   levETIRAcetam 500 MG tablet Commonly known as: KEPPRA Take 500-750 mg by mouth 2 (two) times daily. Take one tablet (500) by mouth daily in the morning and one and one-half tablets (750 mg) in the evening   loperamide 2 MG capsule Commonly known as: IMODIUM Take 4 mg by mouth as needed for diarrhea or loose stools.   magnesium hydroxide 400 MG/5ML suspension Commonly known as: MILK OF MAGNESIA Take 30 mLs by mouth daily as needed for mild constipation or moderate constipation.   mometasone-formoterol 100-5 MCG/ACT Aero Commonly known as: DULERA Inhale 2 puffs into the lungs 2 (two) times daily.   oxyCODONE 5 MG immediate release tablet Commonly known as: Roxicodone Take 0.5 tablets (2.5 mg total) by mouth every 6 (six) hours as needed for up to 5 days for moderate pain.   pantoprazole 40 MG tablet Commonly known as: PROTONIX Take 40 mg by mouth daily before breakfast.   phenytoin 100 MG ER  capsule Commonly known as: DILANTIN Take 100 mg by mouth 2 (two) times daily.   QUEtiapine 25 MG tablet Commonly known as: SEROQUEL Take 25 mg by mouth at bedtime.   sucralfate 1 g tablet Commonly known as: CARAFATE Take 1 g by mouth 4 (four) times daily.   Vimpat 150 MG Tabs Generic drug: Lacosamide Take 1 tablet by mouth 2 (two) times daily.   vitamin B-12 500 MCG tablet Commonly known as: CYANOCOBALAMIN Take 500 mcg by mouth daily.       Allergies  Allergen Reactions  . Ciprofloxacin Other (See Comments)    Other Reaction: Lowered seizure threshold  . Aspirin Other (See Comments)    Reaction: Unknown    Consultations:  Neurology   Procedures/Studies: DG Ribs Unilateral W/Chest Right  Result Date: 08/03/2019 CLINICAL DATA:  Right lower rib pain after fall. EXAM: RIGHT RIBS AND CHEST - 3+ VIEW COMPARISON:  Chest x-ray dated June 26, 2019. CT chest dated March 27, 2019. FINDINGS: Acute fractures of  the right lateral fifth through seventh ribs. The fifth rib fracture is minimally displaced. The heart size and mediastinal contours are within normal limits. Atherosclerotic calcification of the aortic arch. Normal pulmonary vascularity. No focal consolidation, pleural effusion, or pneumothorax. IMPRESSION: 1. Acute fractures of the right lateral fifth through seventh ribs. 2.  No active cardiopulmonary disease. Electronically Signed   By: Titus Dubin M.D.   On: 08/03/2019 15:14   DG Knee 2 Views Left  Result Date: 08/04/2019 CLINICAL DATA:  Seizure-like activity. Bilateral knee pain. EXAM: LEFT KNEE - 1-2 VIEW COMPARISON:  02/15/2016 left knee radiographs FINDINGS: The patella appears laterally subluxed. No fracture. No tibiofemoral dislocation. No focal osseous lesions. No radiopaque foreign bodies. Mild-to-moderate tricompartmental osteoarthritis. No appreciable joint effusion. IMPRESSION: Apparent lateral left patellar subluxation. No fracture. 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 Electronically Signed   By: Ilona Sorrel M.D.   On: 08/04/2019 11:02   DG Knee 2 Views Right  Result Date: 08/04/2019 CLINICAL DATA:  Altered mental status, bilateral knee pain, seizure like activity EXAM: RIGHT KNEE - 1-2 VIEW COMPARISON:  None. FINDINGS: No fracture or dislocation. No joint effusion. Moderate tricompartmental osteoarthritis. No focal osseous lesions. No radiopaque foreign bodies. IMPRESSION: Moderate tricompartmental right knee osteoarthritis. No acute osseous abnormality. Electronically Signed   By: Ilona Sorrel M.D.   On: 08/04/2019 11:00   CT Head Wo Contrast  Result Date: 08/04/2019 CLINICAL DATA:  Seizure with fall. EXAM: CT HEAD WITHOUT CONTRAST CT CERVICAL SPINE WITHOUT CONTRAST TECHNIQUE: Multidetector CT imaging of the head and cervical spine was performed following the standard protocol without intravenous contrast. Multiplanar CT image reconstructions of the cervical spine were also generated. COMPARISON:  05/30/2019 head and cervical spine CT. 06/26/2019 head CT. FINDINGS: CT HEAD FINDINGS Brain: Encephalomalacia in the anterior right temporal lobe is unchanged. No evidence of parenchymal hemorrhage or extra-axial fluid collection. No mass lesion, mass effect, or midline shift. No CT evidence of acute infarction. Cerebral volume is age appropriate. No ventriculomegaly. Vascular: No acute abnormality. Skull: No evidence of calvarial fracture. Chronic deformity in the right zygomatic arch and lateral right orbital walls. Sinuses/Orbits: Minimal patchy opacity in the dependent right maxillary sinus without definite air-fluid levels. Other:  The mastoid air cells are unopacified. CT CERVICAL SPINE FINDINGS Alignment: Normal cervical lordosis. No facet subluxation. Dens is well positioned between the lateral masses of C1. Skull base and vertebrae: No acute fracture. No primary bone lesion or focal pathologic process. Soft tissues and spinal  canal: No prevertebral edema. No visible canal hematoma. Disc levels: Marked multilevel degenerative disc disease throughout the cervical spine, most prominent at C4-5. Moderate bilateral facet arthropathy. Moderate degenerative foraminal stenosis on the right at C4-5 and C5-6. Upper chest: No acute abnormality. Other: Visualized mastoid air cells appear clear. No discrete thyroid nodules. No pathologically enlarged cervical nodes. IMPRESSION: 1. No evidence of acute intracranial abnormality. No evidence of calvarial fracture. 2. Stable encephalomalacia in the anterior right temporal lobe. 3. Chronic deformity in the right zygomatic arch and lateral right orbital walls. 4. No cervical spine fracture or subluxation. 5. Moderate to marked multilevel degenerative changes in the cervical spine as detailed. Electronically Signed   By: Ilona Sorrel M.D.   On: 08/04/2019 10:45   CT Cervical Spine Wo Contrast  Result Date: 08/04/2019 CLINICAL DATA:  Seizure with fall. EXAM: CT HEAD WITHOUT CONTRAST CT CERVICAL SPINE WITHOUT CONTRAST TECHNIQUE: Multidetector CT imaging of the head and cervical spine was performed following the standard protocol without  intravenous contrast. Multiplanar CT image reconstructions of the cervical spine were also generated. COMPARISON:  05/30/2019 head and cervical spine CT. 06/26/2019 head CT. FINDINGS: CT HEAD FINDINGS Brain: Encephalomalacia in the anterior right temporal lobe is unchanged. No evidence of parenchymal hemorrhage or extra-axial fluid collection. No mass lesion, mass effect, or midline shift. No CT evidence of acute infarction. Cerebral volume is age appropriate. No ventriculomegaly. Vascular: No acute abnormality. Skull: No evidence of calvarial fracture. Chronic deformity in the right zygomatic arch and lateral right orbital walls. Sinuses/Orbits: Minimal patchy opacity in the dependent right maxillary sinus without definite air-fluid levels. Other:  The mastoid air cells  are unopacified. CT CERVICAL SPINE FINDINGS Alignment: Normal cervical lordosis. No facet subluxation. Dens is well positioned between the lateral masses of C1. Skull base and vertebrae: No acute fracture. No primary bone lesion or focal pathologic process. Soft tissues and spinal canal: No prevertebral edema. No visible canal hematoma. Disc levels: Marked multilevel degenerative disc disease throughout the cervical spine, most prominent at C4-5. Moderate bilateral facet arthropathy. Moderate degenerative foraminal stenosis on the right at C4-5 and C5-6. Upper chest: No acute abnormality. Other: Visualized mastoid air cells appear clear. No discrete thyroid nodules. No pathologically enlarged cervical nodes. IMPRESSION: 1. No evidence of acute intracranial abnormality. No evidence of calvarial fracture. 2. Stable encephalomalacia in the anterior right temporal lobe. 3. Chronic deformity in the right zygomatic arch and lateral right orbital walls. 4. No cervical spine fracture or subluxation. 5. Moderate to marked multilevel degenerative changes in the cervical spine as detailed. Electronically Signed   By: Ilona Sorrel M.D.   On: 08/04/2019 10:45   MR BRAIN WO CONTRAST  Result Date: 08/05/2019 CLINICAL DATA:  Acute neuro deficit.  Rule out stroke EXAM: MRI HEAD WITHOUT CONTRAST TECHNIQUE: Multiplanar, multiecho pulse sequences of the brain and surrounding structures were obtained without intravenous contrast. COMPARISON:  CT head 08/04/2019 FINDINGS: Brain: Negative for acute infarct. Chronic infarct right temporal lobe. Minimal changes in the cerebral white matter. Brainstem and cerebellum intact. No ventricular enlargement. Negative for hemorrhage or mass. Vascular: Normal arterial flow voids Skull and upper cervical spine: No focal skeletal abnormality. Sinuses/Orbits: Small air-fluid level right maxillary sinus. Retained secretions in the left sphenoid and ethmoid sinus. Bilateral cataract extraction.  No  orbital mass Other: None IMPRESSION: No acute abnormality Chronic infarct right temporal lobe unchanged. Electronically Signed   By: Franchot Gallo M.D.   On: 08/05/2019 12:57   DG Chest Portable 1 View  Result Date: 08/04/2019 CLINICAL DATA:  Altered mental status EXAM: PORTABLE CHEST 1 VIEW COMPARISON:  08/03/2019 FINDINGS: No pneumothorax or pleural effusion. No new consolidation edema. Stable cardiomediastinal contours. Mildly displaced right rib fractures are again identified. IMPRESSION: No new findings in the chest.  Right rib fractures again noted. Electronically Signed   By: Macy Mis M.D.   On: 08/04/2019 10:50   DG HIP UNILAT WITH PELVIS 2-3 VIEWS LEFT  Result Date: 08/04/2019 CLINICAL DATA:  Status post seizure. Bilateral knee, neck and leg pain. EXAM: DG HIP (WITH OR WITHOUT PELVIS) 2-3V LEFT; DG HIP (WITH OR WITHOUT PELVIS) 2-3V RIGHT COMPARISON:  None. FINDINGS: There is no evidence of hip fracture or dislocation. There is no evidence of arthropathy or other focal bone abnormality. IMPRESSION: No acute osseous injury of the bilateral hips. Electronically Signed   By: Kathreen Devoid   On: 08/04/2019 10:53   DG HIP UNILAT WITH PELVIS 2-3 VIEWS RIGHT  Result Date: 08/04/2019 CLINICAL DATA:  Status post seizure. Bilateral knee, neck and leg pain. EXAM: DG HIP (WITH OR WITHOUT PELVIS) 2-3V LEFT; DG HIP (WITH OR WITHOUT PELVIS) 2-3V RIGHT COMPARISON:  None. FINDINGS: There is no evidence of hip fracture or dislocation. There is no evidence of arthropathy or other focal bone abnormality. IMPRESSION: No acute osseous injury of the bilateral hips. Electronically Signed   By: Kathreen Devoid   On: 08/04/2019 10:53      Subjective: No complaints.  No headache, seizure overnight.  No fever, vomting.  No cough, dyspnea, sputum.  Discharge Exam: Vitals:   08/05/19 0548 08/05/19 0832  BP: (!) 143/74 139/75  Pulse: 68 65  Resp: 16 18  Temp: 98.1 F (36.7 C) 97.8 F (36.6 C)  SpO2: 97% 99%    Vitals:   08/04/19 1500 08/04/19 2245 08/05/19 0548 08/05/19 0832  BP:  (!) 163/74 (!) 143/74 139/75  Pulse:  73 68 65  Resp:  20 16 18   Temp:  98.7 F (37.1 C) 98.1 F (36.7 C) 97.8 F (36.6 C)  TempSrc:  Oral Oral Oral  SpO2:  100% 97% 99%  Weight: 82.7 kg     Height: 5\' 8"  (1.727 m)       General: Pt is alert, awake, not in acute distress Cardiovascular: RRR, nl S1-S2, no murmurs appreciated.   No LE edema.   Respiratory: Normal respiratory rate and rhythm.  CTAB without rales or wheezes. Abdominal: Abdomen soft and non-tender.  No distension or HSM.   Neuro/Psych: Strength symmetric in upper and lower extremities.  Judgment and insight appear moderately impaired but at baseline.  CHronic right facial droop.   The results of significant diagnostics from this hospitalization (including imaging, microbiology, ancillary and laboratory) are listed below for reference.     Microbiology: Recent Results (from the past 240 hour(s))  SARS CORONAVIRUS 2 (TAT 6-24 HRS) Nasopharyngeal Nasopharyngeal Swab     Status: Abnormal   Collection Time: 08/04/19 11:47 AM   Specimen: Nasopharyngeal Swab  Result Value Ref Range Status   SARS Coronavirus 2 POSITIVE (A) NEGATIVE Final    Comment: RESULT CALLED TO, READ BACK BY AND VERIFIED WITH: K.HORN RN 2103 08/04/19 MCCORMICK K (NOTE) SARS-CoV-2 target nucleic acids are DETECTED. The SARS-CoV-2 RNA is generally detectable in upper and lower respiratory specimens during the acute phase of infection. Positive results are indicative of the presence of SARS-CoV-2 RNA. Clinical correlation with patient history and other diagnostic information is  necessary to determine patient infection status. Positive results do not rule out bacterial infection or co-infection with other viruses.  The expected result is Negative. Fact Sheet for Patients: SugarRoll.be Fact Sheet for Healthcare  Providers: https://www.woods-mathews.com/ This test is not yet approved or cleared by the [place] FDA and  has been authorized for detection and/or diagnosis of SARS-CoV-2 by FDA under an Emergency Use Authorization (EUA). This EUA will remain  in effect (meaning this test can be used) for the  duration of the COVID-19 declaration under Section 564(b)(1) of the Act, 21 U.S.C. section 360bbb-3(b)(1), unless the authorization is terminated or revoked sooner. Performed at [facility], [place] [address] [place], [place] [address]   MRSA PCR Screening     Status: None   Collection Time: 08/04/19  1:23 PM   Specimen: Nasal Mucosa; Nasopharyngeal  Result Value Ref Range Status   MRSA by PCR NEGATIVE NEGATIVE Final    Comment:        The GeneXpert MRSA Assay (FDA approved for  NASAL specimens only), is one component of a comprehensive MRSA colonization surveillance program. It is not intended to diagnose MRSA infection nor to guide or monitor treatment for MRSA infections. Performed at [facility], [address] [place], [place] [address]      Labs: BNP (last 3 results) No results for input(s): BNP in the last 8760 hours. Basic Metabolic Panel: Recent Labs  Lab 08/04/19 0957 08/05/19 0547  NA 141  --   K 3.2*  --   CL 104  --   CO2 26  --   GLUCOSE 116*  --   BUN 12  --   CREATININE 0.71  --   CALCIUM 9.5  --   MG  --  2.0   Liver Function Tests: Recent Labs  Lab 08/04/19 0957  AST 24  ALT 17  ALKPHOS 69  BILITOT 0.6  PROT 7.1  ALBUMIN 4.2   No results for input(s): LIPASE, AMYLASE in the last 168 hours. No results for input(s): AMMONIA in the last 168 hours. CBC: Recent Labs  Lab 08/04/19 0957  WBC 4.5  NEUTROABS 3.1  HGB 12.9  HCT 37.9  MCV 100.8*  PLT 203   Cardiac Enzymes: No results for input(s): CKTOTAL, CKMB, CKMBINDEX, TROPONINI in the last 168 hours. BNP: Invalid input(s): POCBNP CBG: No results for  input(s): GLUCAP in the last 168 hours. D-Dimer No results for input(s): DDIMER in the last 72 hours. Hgb A1c No results for input(s): HGBA1C in the last 72 hours. Lipid Profile No results for input(s): CHOL, HDL, LDLCALC, TRIG, CHOLHDL, LDLDIRECT in the last 72 hours. Thyroid function studies No results for input(s): TSH, T4TOTAL, T3FREE, THYROIDAB in the last 72 hours.  Invalid input(s): FREET3 Anemia work up No results for input(s): VITAMINB12, FOLATE, FERRITIN, TIBC, IRON, RETICCTPCT in the last 72 hours. Urinalysis    Component Value Date/Time   COLORURINE YELLOW (A) 08/04/2019 1620   APPEARANCEUR HAZY (A) 08/04/2019 1620   APPEARANCEUR Clear 03/18/2014 1912   LABSPEC 1.014 08/04/2019 1620   LABSPEC 1.004 03/18/2014 1912   PHURINE 7.0 08/04/2019 1620   GLUCOSEU NEGATIVE 08/04/2019 1620   GLUCOSEU Negative 03/18/2014 1912   HGBUR MODERATE (A) 08/04/2019 1620   BILIRUBINUR NEGATIVE 08/04/2019 1620   BILIRUBINUR Negative 03/18/2014 1912   KETONESUR 5 (A) 08/04/2019 1620   PROTEINUR NEGATIVE 08/04/2019 1620   UROBILINOGEN 0.2 11/06/2010 0900   NITRITE NEGATIVE 08/04/2019 1620   LEUKOCYTESUR NEGATIVE 08/04/2019 1620   LEUKOCYTESUR Negative 03/18/2014 1912   Sepsis Labs Invalid input(s): PROCALCITONIN,  WBC,  LACTICIDVEN Microbiology Recent Results (from the past 240 hour(s))  SARS CORONAVIRUS 2 (TAT 6-24 HRS) Nasopharyngeal Nasopharyngeal Swab     Status: Abnormal   Collection Time: 08/04/19 11:47 AM   Specimen: Nasopharyngeal Swab  Result Value Ref Range Status   SARS Coronavirus 2 POSITIVE (A) NEGATIVE Final    Comment: RESULT CALLED TO, READ BACK BY AND VERIFIED WITH: K.HORN RN 2103 08/04/19 MCCORMICK K (NOTE) SARS-CoV-2 target nucleic acids are DETECTED. The SARS-CoV-2 RNA is generally detectable in upper and lower respiratory specimens during the acute phase of infection. Positive results are indicative of the presence of SARS-CoV-2 RNA. Clinical correlation  with patient history and other diagnostic information is  necessary to determine patient infection status. Positive results do not rule out bacterial infection or co-infection with other viruses.  The expected result is Negative. Fact Sheet for Patients: SugarRoll.be Fact Sheet for Healthcare Providers: https://www.woods-mathews.com/ This test is not yet approved or cleared  by the [place] and  has been authorized for detection and/or diagnosis of SARS-CoV-2 by FDA under an Emergency Use Authorization (EUA). This EUA will remain  in effect (meaning this test can be used) for the  duration of the COVID-19 declaration under Section 564(b)(1) of the Act, 21 U.S.C. section 360bbb-3(b)(1), unless the authorization is terminated or revoked sooner. Performed at [facility], [place] [address] [place], [place] [address]   MRSA PCR Screening     Status: None   Collection Time: 08/04/19  1:23 PM   Specimen: Nasal Mucosa; Nasopharyngeal  Result Value Ref Range Status   MRSA by PCR NEGATIVE NEGATIVE Final    Comment:        The GeneXpert MRSA Assay (FDA approved for NASAL specimens only), is one component of a comprehensive MRSA colonization surveillance program. It is not intended to diagnose MRSA infection nor to guide or monitor treatment for MRSA infections. Performed at [facility], [address] [place], [place] [address]      Time coordinating discharge: 35 minutes The [place] controlled substances registry was reviewed for this patient prior to filling the <5 days supply controlled substances script.      SIGNED:   Edwin Dada, MD  [facility] 08/05/2019, 2:02 PM

## 2019-08-05 NOTE — Consult Note (Signed)
Requesting Physician: Dr. Loleta Books    Chief Complaint: Altered mental status  History obtained from: Patient and Chart   HPI:                                                                                                                                       Marcia Brown is a 64 y.o. 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  Declined admission and was given tramadol for pain control-presented yesterday to the emergency department seizure activity was not witnessed, but patient was lethargic at her living facility and there was concern for postictal state.  Patient was admitted yesterday, has not had any further clinical seizures.  Dilantin level is 12.2. UA negative for infection. No leukocytosis. Sodium is normal.   Chart review See Dr. Melrose Nakayama for seizures.  Seizure meds on last visit 03/03/19:  Vimpat 150 mg twice daily, Lamictal 250 mg twice daily, Keppra 500 mg in the morning and 750 mg at night, and Dilantin 100 mg twice daily   Workup in the past included Amb EEG and MRI Brain.  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  AMBULATORY EEG 11/29/2012 Abnormal 4 day study due to mild right temporal slowing, and frequent left temporal and occasional right temporal independent interictal epileptiform activities. The focal slowing is suggestive of an underlying structural lesion or vascular lesion in this location. The bilaterally independent interictal epileptiform activities suggest cortical irritability and increased predisposition to seizures with focal mechanism of onset.    Past Medical History:  Diagnosis Date  . Anxiety   . Asthma   . Cerebral ischemia   . COPD (chronic obstructive pulmonary disease) ([place])   . Depression   . High cholesterol   . Polyneuropathy    . Seizures ([place])   . Sleep apnea     Past Surgical History:  Procedure Laterality Date  . ESOPHAGEAL DILATION    . FOOT SURGERY Right     Family History  Problem Relation Age of Onset  . Breast cancer Daughter 38   Social History:  reports that she has been smoking cigarettes. She has been smoking about 0.50 packs per day. She has never used smokeless tobacco. She reports that she does not drink alcohol or use drugs.  Allergies:  Allergies  Allergen Reactions  . Ciprofloxacin Other (See Comments)    Other Reaction: Lowered seizure threshold  . Aspirin Other (See Comments)    Reaction: Unknown    Medications:  I reviewed home medications   ROS:                                                                                                                                     14 systems reviewed and negative except above    Examination:                                                                                                      General: Appears well-developed and well-nourished.  Psych: Affect appropriate to situation Eyes: No scleral injection HENT: No OP obstrucion Head: Normocephalic.  Cardiovascular: Normal rate and regular rhythm.  Respiratory: Effort normal and breath sounds normal to anterior ascultation GI: Soft.  No distension. There is no tenderness.  Skin: WDI    Neurological Examination Mental Status: Alert, oriented to month, year and name. Confused to place. Slightly confused and a porr historian. Speech fluent without evidence of aphasia. Able to simple commands without difficulty. Cranial Nerves: II: Visual fields grossly normal,  III,IV, VI: ptosis not present, extra-ocular motions intact bilaterally, pupils equal, round, reactive to light and accommodation V,VII: Mild facial asymmetry, facial light touch sensation normal  bilaterally VIII: hearing normal bilaterally IX,X: uvula rises symmetrically XI: bilateral shoulder shrug XII: midline tongue extension Motor: Right : Upper extremity   5/5    Left:     Upper extremity   5/5  Lower extremity   5/5     Lower extremity   5/5 Tone and bulk:normal tone throughout; no atrophy noted Sensory: Pinprick and light touch intact throughout, bilaterally Deep Tendon Reflexes: 2+ and symmetric throughout Plantars: Right: downgoing   Left: downgoing Cerebellar: normal finger-to-nose, normal rapid alternating movements and normal heel-to-shin test Gait: normal gait and station     Lab Results: Basic Metabolic Panel: Recent Labs  Lab 08/04/19 0957 08/05/19 0547  NA 141  --   K 3.2*  --   CL 104  --   CO2 26  --   GLUCOSE 116*  --   BUN 12  --   CREATININE 0.71  --   CALCIUM 9.5  --   MG  --  2.0    CBC: Recent Labs  Lab 08/04/19 0957  WBC 4.5  NEUTROABS 3.1  HGB 12.9  HCT 37.9  MCV 100.8*  PLT 203    Coagulation Studies: No results for input(s): LABPROT, INR in the last 72 hours.  Imaging: DG Ribs Unilateral W/Chest Right  Result Date: 08/03/2019 CLINICAL DATA:  Right lower rib pain after fall. EXAM: RIGHT RIBS AND CHEST - 3+ VIEW COMPARISON:  Chest x-ray dated June 26, 2019. CT chest dated March 27, 2019. FINDINGS: Acute fractures of the right lateral fifth through seventh ribs. The fifth rib fracture is minimally displaced. The heart size and mediastinal contours are within normal limits. Atherosclerotic calcification of the aortic arch. Normal pulmonary vascularity. No focal consolidation, pleural effusion, or pneumothorax. IMPRESSION: 1. Acute fractures of the right lateral fifth through seventh ribs. 2.  No active cardiopulmonary disease. Electronically Signed   By: Titus Dubin M.D.   On: 08/03/2019 15:14   DG Knee 2 Views Left  Result Date: 08/04/2019 CLINICAL DATA:  Seizure-like activity. Bilateral knee pain. EXAM: LEFT KNEE -  1-2 VIEW COMPARISON:  02/15/2016 left knee radiographs FINDINGS: The patella appears laterally subluxed. No fracture. No tibiofemoral dislocation. No focal osseous lesions. No radiopaque foreign bodies. Mild-to-moderate tricompartmental osteoarthritis. No appreciable joint effusion. IMPRESSION: Apparent lateral left patellar subluxation. No fracture. 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 Electronically Signed   By: Ilona Sorrel M.D.   On: 08/04/2019 11:02   DG Knee 2 Views Right  Result Date: 08/04/2019 CLINICAL DATA:  Altered mental status, bilateral knee pain, seizure like activity EXAM: RIGHT KNEE - 1-2 VIEW COMPARISON:  None. FINDINGS: No fracture or dislocation. No joint effusion. Moderate tricompartmental osteoarthritis. No focal osseous lesions. No radiopaque foreign bodies. IMPRESSION: Moderate tricompartmental right knee osteoarthritis. No acute osseous abnormality. Electronically Signed   By: Ilona Sorrel M.D.   On: 08/04/2019 11:00   CT Head Wo Contrast  Result Date: 08/04/2019 CLINICAL DATA:  Seizure with fall. EXAM: CT HEAD WITHOUT CONTRAST CT CERVICAL SPINE WITHOUT CONTRAST TECHNIQUE: Multidetector CT imaging of the head and cervical spine was performed following the standard protocol without intravenous contrast. Multiplanar CT image reconstructions of the cervical spine were also generated. COMPARISON:  05/30/2019 head and cervical spine CT. 06/26/2019 head CT. FINDINGS: CT HEAD FINDINGS Brain: Encephalomalacia in the anterior right temporal lobe is unchanged. No evidence of parenchymal hemorrhage or extra-axial fluid collection. No mass lesion, mass effect, or midline shift. No CT evidence of acute infarction. Cerebral volume is age appropriate. No ventriculomegaly. Vascular: No acute abnormality. Skull: No evidence of calvarial fracture. Chronic deformity in the right zygomatic arch and lateral right orbital walls. Sinuses/Orbits: Minimal patchy opacity in the  dependent right maxillary sinus without definite air-fluid levels. Other:  The mastoid air cells are unopacified. CT CERVICAL SPINE FINDINGS Alignment: Normal cervical lordosis. No facet subluxation. Dens is well positioned between the lateral masses of C1. Skull base and vertebrae: No acute fracture. No primary bone lesion or focal pathologic process. Soft tissues and spinal canal: No prevertebral edema. No visible canal hematoma. Disc levels: Marked multilevel degenerative disc disease throughout the cervical spine, most prominent at C4-5. Moderate bilateral facet arthropathy. Moderate degenerative foraminal stenosis on the right at C4-5 and C5-6. Upper chest: No acute abnormality. Other: Visualized mastoid air cells appear clear. No discrete thyroid nodules. No pathologically enlarged cervical nodes. IMPRESSION: 1. No evidence of acute intracranial abnormality. No evidence of calvarial fracture. 2. Stable encephalomalacia in the anterior right temporal lobe. 3. Chronic deformity in the right zygomatic arch and lateral right orbital walls. 4. No cervical spine fracture or subluxation. 5. Moderate to marked multilevel degenerative changes in the cervical spine as detailed. Electronically Signed   By: Ilona Sorrel M.D.   On: 08/04/2019 10:45   CT Cervical Spine Wo Contrast  Result Date: 08/04/2019 CLINICAL  DATA:  Seizure with fall. EXAM: CT HEAD WITHOUT CONTRAST CT CERVICAL SPINE WITHOUT CONTRAST TECHNIQUE: Multidetector CT imaging of the head and cervical spine was performed following the standard protocol without intravenous contrast. Multiplanar CT image reconstructions of the cervical spine were also generated. COMPARISON:  05/30/2019 head and cervical spine CT. 06/26/2019 head CT. FINDINGS: CT HEAD FINDINGS Brain: Encephalomalacia in the anterior right temporal lobe is unchanged. No evidence of parenchymal hemorrhage or extra-axial fluid collection. No mass lesion, mass effect, or midline shift. No CT  evidence of acute infarction. Cerebral volume is age appropriate. No ventriculomegaly. Vascular: No acute abnormality. Skull: No evidence of calvarial fracture. Chronic deformity in the right zygomatic arch and lateral right orbital walls. Sinuses/Orbits: Minimal patchy opacity in the dependent right maxillary sinus without definite air-fluid levels. Other:  The mastoid air cells are unopacified. CT CERVICAL SPINE FINDINGS Alignment: Normal cervical lordosis. No facet subluxation. Dens is well positioned between the lateral masses of C1. Skull base and vertebrae: No acute fracture. No primary bone lesion or focal pathologic process. Soft tissues and spinal canal: No prevertebral edema. No visible canal hematoma. Disc levels: Marked multilevel degenerative disc disease throughout the cervical spine, most prominent at C4-5. Moderate bilateral facet arthropathy. Moderate degenerative foraminal stenosis on the right at C4-5 and C5-6. Upper chest: No acute abnormality. Other: Visualized mastoid air cells appear clear. No discrete thyroid nodules. No pathologically enlarged cervical nodes. IMPRESSION: 1. No evidence of acute intracranial abnormality. No evidence of calvarial fracture. 2. Stable encephalomalacia in the anterior right temporal lobe. 3. Chronic deformity in the right zygomatic arch and lateral right orbital walls. 4. No cervical spine fracture or subluxation. 5. Moderate to marked multilevel degenerative changes in the cervical spine as detailed. Electronically Signed   By: Ilona Sorrel M.D.   On: 08/04/2019 10:45   DG Chest Portable 1 View  Result Date: 08/04/2019 CLINICAL DATA:  Altered mental status EXAM: PORTABLE CHEST 1 VIEW COMPARISON:  08/03/2019 FINDINGS: No pneumothorax or pleural effusion. No new consolidation edema. Stable cardiomediastinal contours. Mildly displaced right rib fractures are again identified. IMPRESSION: No new findings in the chest.  Right rib fractures again noted.  Electronically Signed   By: Macy Mis M.D.   On: 08/04/2019 10:50   DG HIP UNILAT WITH PELVIS 2-3 VIEWS LEFT  Result Date: 08/04/2019 CLINICAL DATA:  Status post seizure. Bilateral knee, neck and leg pain. EXAM: DG HIP (WITH OR WITHOUT PELVIS) 2-3V LEFT; DG HIP (WITH OR WITHOUT PELVIS) 2-3V RIGHT COMPARISON:  None. FINDINGS: There is no evidence of hip fracture or dislocation. There is no evidence of arthropathy or other focal bone abnormality. IMPRESSION: No acute osseous injury of the bilateral hips. Electronically Signed   By: Kathreen Devoid   On: 08/04/2019 10:53   DG HIP UNILAT WITH PELVIS 2-3 VIEWS RIGHT  Result Date: 08/04/2019 CLINICAL DATA:  Status post seizure. Bilateral knee, neck and leg pain. EXAM: DG HIP (WITH OR WITHOUT PELVIS) 2-3V LEFT; DG HIP (WITH OR WITHOUT PELVIS) 2-3V RIGHT COMPARISON:  None. FINDINGS: There is no evidence of hip fracture or dislocation. There is no evidence of arthropathy or other focal bone abnormality. IMPRESSION: No acute osseous injury of the bilateral hips. Electronically Signed   By: Kathreen Devoid   On: 08/04/2019 10:53   Imaging MRI Aaron Edelman : I reviewed MRI brain, no acute abnormalities.  No evidence of acute stroke.    ASSESSMENT AND PLAN  64 y.o. female with past medical history of intractable  epilepsy on seizure medications, stroke, COPD, polyneuropathy, hyperlipidemia, sleep apnea presents to the emergency department with altered mental status, staff concern for postictal state following seizure.  Patient has not had any further clinical seizures since admission and is awake today, she is slightly confused, unclear if this is a baseline.   Acute encephalopathy Intractable epilepsy Breakthrough seizure likely in the setting of reduced seizure threshold from tramadol Recurrent falls: possibly from Vimpat vs polyneuropathy   Continue home medications: Lamotrigine, Vimpat, Keppra and Dilantin, also on  Clonazepam, Gabapentin Hold Tramadol,  please do not continue Check levels of Dilantin MRI Brain ordered: will follow up  Routine EEG ordered  Seizure precautions including no driving (patient does not drive)  Wil defer to Dr. Melrose Nakayama if he feels falls are due to Vimpat, will continue this med as she has been on it for several years.   Please follow-up with outpatient neurologist Dr. Melrose Nakayama  in 2 to 4 weeks.    Krishay Faro [facility] Pager Number [phone]

## 2019-08-22 NOTE — Assessment & Plan Note (Signed)
counseling when pt is stable and comfortable.

## 2019-08-22 NOTE — Assessment & Plan Note (Signed)
Attribute to  Tramadol. Pt continued on dilantin/ vimpat/  Keppra/ and lamictal.

## 2019-10-08 ENCOUNTER — Emergency Department
Admission: EM | Admit: 2019-10-08 | Discharge: 2019-10-08 | Disposition: A | Discharge status: Home / Self Care | Payer: Medicare Other | Attending: Student | Admitting: Student

## 2019-10-08 ENCOUNTER — Other Ambulatory Visit: Payer: Self-pay

## 2019-10-08 ENCOUNTER — Emergency Department: Payer: Medicare Other

## 2019-10-08 DIAGNOSIS — R4182 Altered mental status, unspecified: Secondary | ICD-10-CM | POA: Diagnosis present

## 2019-10-08 DIAGNOSIS — F17210 Nicotine dependence, cigarettes, uncomplicated: Secondary | ICD-10-CM | POA: Diagnosis not present

## 2019-10-08 DIAGNOSIS — R569 Unspecified convulsions: Secondary | ICD-10-CM | POA: Insufficient documentation

## 2019-10-08 DIAGNOSIS — Z8673 Personal history of transient ischemic attack (TIA), and cerebral infarction without residual deficits: Secondary | ICD-10-CM | POA: Insufficient documentation

## 2019-10-08 DIAGNOSIS — Z79899 Other long term (current) drug therapy: Secondary | ICD-10-CM | POA: Insufficient documentation

## 2019-10-08 LAB — CBC
HCT: 38.9 % (ref 36.0–46.0)
Hemoglobin: 13.5 g/dL (ref 12.0–15.0)
MCH: 35.1 pg — ABNORMAL HIGH (ref 26.0–34.0)
MCHC: 34.7 g/dL (ref 30.0–36.0)
MCV: 101 fL — ABNORMAL HIGH (ref 80.0–100.0)
Platelets: 196 10*3/uL (ref 150–400)
RBC: 3.85 MIL/uL — ABNORMAL LOW (ref 3.87–5.11)
RDW: 13 % (ref 11.5–15.5)
WBC: 4.4 10*3/uL (ref 4.0–10.5)
nRBC: 0 % (ref 0.0–0.2)

## 2019-10-08 LAB — URINALYSIS, COMPLETE (UACMP) WITH MICROSCOPIC
Bilirubin Urine: NEGATIVE
Glucose, UA: NEGATIVE mg/dL
Ketones, ur: NEGATIVE mg/dL
Leukocytes,Ua: NEGATIVE
Nitrite: NEGATIVE
Protein, ur: NEGATIVE mg/dL
Specific Gravity, Urine: 1.004 — ABNORMAL LOW (ref 1.005–1.030)
pH: 6 (ref 5.0–8.0)

## 2019-10-08 LAB — BASIC METABOLIC PANEL
Anion gap: 9 (ref 5–15)
BUN: 16 mg/dL (ref 8–23)
CO2: 25 mmol/L (ref 22–32)
Calcium: 9 mg/dL (ref 8.9–10.3)
Chloride: 105 mmol/L (ref 98–111)
Creatinine, Ser: 0.72 mg/dL (ref 0.44–1.00)
GFR calc Af Amer: >60 mL/min (ref >60)
GFR calc non Af Amer: >60 mL/min (ref >60)
Glucose, Bld: 105 mg/dL — ABNORMAL HIGH (ref 70–99)
Potassium: 3.3 mmol/L — ABNORMAL LOW (ref 3.5–5.1)
Sodium: 139 mmol/L (ref 135–145)

## 2019-10-08 LAB — PHENYTOIN LEVEL, TOTAL: Phenytoin Lvl: 11 ug/mL (ref 10.0–20.0)

## 2019-10-08 NOTE — ED Notes (Signed)
UA to lab, purewick in place.

## 2019-10-08 NOTE — ED Notes (Signed)
Discharge instructions called back to [facility], they are unable to transport her back to facility. Pt states she has no family in the area and needs to be transported back by EMS.

## 2019-10-08 NOTE — ED Notes (Signed)
Lab called with red and green hemolyzed tubes.

## 2019-10-08 NOTE — ED Notes (Signed)
Patient discharged to home per MD order. Patient in stable condition, and deemed medically cleared by ED provider for discharge. Discharge instructions reviewed with patient/family using "Teach Back"; verbalized understanding of medication education and administration, and information about follow-up care. Denies further concerns. ° °

## 2019-10-08 NOTE — ED Notes (Signed)
Report received from [facility]. Patient care assumed. Patient/RN introduction complete. Will continue to monitor.

## 2019-10-08 NOTE — Discharge Instructions (Addendum)
Thank you for letting us take care of you in the emergency department today.   Please continue to take any regular, prescribed medications.   Please follow up with: Your neurology doctor, Dr. Melrose Nakayama, to review your ER visit and follow up on your symptoms.    Please return to the ER for any new or worsening symptoms.

## 2019-10-08 NOTE — ED Triage Notes (Addendum)
Pt arrives via EMS from the [facility] after staff witnessed her coming out of her room making animal noises and having AMS- per EMS pt still altered- cbg 109- VSS- pt has a hx of seizures- pt does not remember what happened which is normal for her after a seizure

## 2019-10-08 NOTE — ED Notes (Signed)
[facility] called for transport. 

## 2019-10-08 NOTE — ED Notes (Signed)
Pt transported for CT 

## 2019-10-08 NOTE — ED Provider Notes (Signed)
[facility] Emergency Department Provider Note  ____________________________________________   First MD Initiated Contact with Patient 10/08/19 1806     (approximate)  I have reviewed the triage vital signs and the nursing notes.  History  Chief Complaint Seizures    HPI Marcia Brown is a 64 y.o. 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  Patient is not sure exactly what led her to present to the emergency department.  She has no recollection of the recent events leading up to her arrival in the ED.  Last thing she remembers was a normal day this morning/afternoon.  Reports compliances w/ her medications.  Denies any pain, nausea, vomiting, weakness, numbness, tingling.  In discussion with her living facility, it sounds like residents were gathered and chatting.  Patient had stepped out to smoke a cigarette when one of the residents found her shaking, presumably seizure-like activity, and was confused afterwards.  Patient states this situation is consistent with when she has had prior seizures, especially in the fact that she cannot recall what happened.  As above, she reports compliance with all of her medications.  Living facility confirms this as well as they ensure she receives her daily meds.   Past Medical Hx Past Medical History:  Diagnosis Date  . Anxiety   . Asthma   . Cerebral ischemia   . COPD (chronic obstructive pulmonary disease) ([place])   . Depression   . High cholesterol   . Polyneuropathy   . Seizures ([place])   . Sleep apnea     Problem List Patient Active Problem List   Diagnosis Date Noted  . Seizures ([place]) 08/04/2019  . Acute lower UTI 06/26/2019  . Hypokalemia 06/26/2019  . AMS (altered mental status) 06/26/2019  . Tobacco abuse 12/27/2015  . Repeated falls 05/24/2015  . Seizure ([place]) 02/03/2015  . 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    Past Surgical Hx Past  Surgical History:  Procedure Laterality Date  . ESOPHAGEAL DILATION    . FOOT SURGERY Right     Medications Prior to Admission medications   Medication Sig Start Date End Date Taking? Authorizing Provider  albuterol (PROVENTIL HFA;VENTOLIN HFA) 108 (90 BASE) MCG/ACT inhaler Inhale 2 puffs into the lungs every 4 (four) hours as needed for wheezing or shortness of breath.    [provider]  alum & mag hydroxide-simeth (ANTACID LIQUID) 200-200-20 MG/5ML suspension Take 30 mLs by mouth every 6 (six) hours as needed for indigestion or heartburn.    [provider]  atorvastatin (LIPITOR) 10 MG tablet Take 10 mg by mouth at bedtime.     [provider]  cholecalciferol (VITAMIN D) 1000 units tablet Take 2,000 Units by mouth daily. Give with Vitamin D3 400 units    [provider]  cholecalciferol (VITAMIN D) 400 UNITS TABS tablet Take 400 Units by mouth daily. Give with Vitamin D3 2000 units    [provider]  clonazePAM (KLONOPIN) 0.5 MG tablet Take 0.5 mg by mouth 3 (three) times daily.     [provider]  clopidogrel (PLAVIX) 75 MG tablet Take 75 mg by mouth daily. 06/19/19   [provider]  cyanocobalamin 500 MCG tablet Take 500 mcg by mouth daily.    [provider]  diclofenac sodium (VOLTAREN) 1 % GEL Apply 4 g topically 3 (three) times daily. Apply to both knees    [provider]  diphenhydrAMINE (BENADRYL)  25 MG tablet Take 25 mg by mouth every 6 (six) hours as needed for allergies.    [provider]  DULoxetine (CYMBALTA) 60 MG capsule Take 60 mg by mouth daily.    [provider]  ferrous sulfate 325 (65 FE) MG tablet Take 325 mg by mouth 3 (three) times daily with meals.    [provider]  gabapentin (NEURONTIN) 100 MG capsule Take 200 mg by mouth 2 (two) times daily.    [provider]  lamoTRIgine (LAMICTAL) 200 MG tablet Take 200-400 mg by mouth daily. Pt takes 2  tablets (400 mg) in the morning on Monday, Wednesday and Friday and only 1 tablet (200 mg) on all other days in the morning.    [provider]  levETIRAcetam (KEPPRA) 500 MG tablet Take 500-750 mg by mouth 2 (two) times daily. Take one tablet (500) by mouth daily in the morning and one and one-half tablets (750 mg) in the evening 06/19/19   [provider]  loperamide (IMODIUM) 2 MG capsule Take 4 mg by mouth as needed for diarrhea or loose stools.    [provider]  magnesium hydroxide (MILK OF MAGNESIA) 400 MG/5ML suspension Take 30 mLs by mouth daily as needed for mild constipation or moderate constipation.    [provider]  mometasone-formoterol (DULERA) 100-5 MCG/ACT AERO Inhale 2 puffs into the lungs 2 (two) times daily.    [provider]  pantoprazole (PROTONIX) 40 MG tablet Take 40 mg by mouth daily before breakfast.     [provider]  phenytoin (DILANTIN) 100 MG ER capsule Take 100 mg by mouth 2 (two) times daily.     [provider]  QUEtiapine (SEROQUEL) 25 MG tablet Take 25 mg by mouth at bedtime. 06/19/19   [provider]  sucralfate (CARAFATE) 1 g tablet Take 1 g by mouth 4 (four) times daily. 06/19/19   [provider]  VIMPAT 150 MG TABS Take 1 tablet by mouth 2 (two) times daily. 06/03/19   [provider]    Allergies Ciprofloxacin and Aspirin  Family Hx Family History  Problem Relation Age of Onset  . Breast cancer Daughter 69    Social Hx Social History   Tobacco Use  . Smoking status: Current Every Day Smoker    Packs/day: 0.50    Types: Cigarettes  . Smokeless tobacco: Never Used  Substance Use Topics  . Alcohol use: No  . Drug use: No     Review of Systems  Constitutional: Negative for fever. Negative for chills. Eyes: Negative for visual changes. ENT: Negative for sore throat. Cardiovascular: Negative for chest pain. Respiratory: Negative for shortness of  breath. Gastrointestinal: Negative for nausea. Negative for vomiting.  Genitourinary: Negative for dysuria. Musculoskeletal: Negative for leg swelling. Skin: Negative for rash. Neurological: Negative for headaches.  Positive for altered mental status, possible seizure.   Physical Exam  Vital Signs: ED Triage Vitals  Enc Vitals Group     BP 10/08/19 1759 (!) 141/83     Pulse Rate 10/08/19 1759 70     Resp 10/08/19 1759 18     Temp 10/08/19 1759 98.8 F (37.1 C)     Temp Source 10/08/19 1759 Oral     SpO2 10/08/19 1759 97 %     Weight 10/08/19 1801 170 lb (77.1 kg)     Height 10/08/19 1801 5\' 4"  (1.626 m)     Head Circumference --      Peak Flow --  Pain Score 10/08/19 1800 0     Pain Loc --      Pain Edu? --      Excl. in [place]? --     Constitutional: Alert and oriented x 3. Well appearing. NAD.  Head: Normocephalic. Atraumatic.  Chronic right facial droop. Eyes: Conjunctivae clear. Sclera anicteric. Pupils equal and symmetric. Nose: No masses or lesions. No congestion or rhinorrhea. Mouth/Throat: Wearing mask.  Neck: No stridor. Trachea midline.  Cardiovascular: Normal rate, regular rhythm. Extremities well perfused. Respiratory: Normal respiratory effort.  Lungs CTAB. Gastrointestinal: Soft. Non-distended. Non-tender.  Genitourinary: Deferred. Musculoskeletal: No lower extremity edema. No deformities. Neurologic:  Normal speech and language.  Chronic right facial droop.  No gross focal or lateralizing neurologic deficits are appreciated.  Skin: Skin is warm, dry and intact. No rash noted. Psychiatric: Mood and affect are appropriate for situation.   Radiology  Personally reviewed available imaging myself.   [facility] - IMPRESSION:  1. No CT evidence for acute intracranial abnormality.  2. Stable right temporal lobe encephalomalacia  3. Maxillary sinusitis    Procedures  Procedure(s) performed (including critical care):  Procedures   Initial Impression /  Assessment and Plan / MDM / ED Course  64 y.o. female who presents to the ED for altered mental status, in the setting of what sounds like seizure-like activity.  Presentation seems most consistent with an observed post ictal state after seizure.  By my evaluation she is alert and oriented, has no acute complaints.  Reports compliance with all of her medications.  Ddx: seizure, postictal period, electrolyte abnormality, UTI  Will plan for labs, urine studies, imaging  Clinical Course as of Oct 07 2025  Thu Oct 08, 2019  2018 Work-up unremarkable, including CT head without acute abnormalities, stable compared to prior in the setting of her known encephalomalacia.  Phenytoin levels appropriate.  No evidence of UTI.  CBC without actionable derangements.  Patient is AO x3, appears to be at her mental baseline, without focal neurological deficits.  As such, feel mentation most consistent with a seizure with postictal state.  Given negative work-up, patient is stable for discharge back to her facility with outpatient follow-up.  Advise follow-up with Dr. Melrose Nakayama, her neurologist and given return precautions.   [SM]    Clinical Course User Index [SM] Lilia Pro., MD     _______________________________   As part of my medical decision making I have reviewed available labs, radiology tests, reviewed old records/performed chart review.    Final Clinical Impression(s) / ED Diagnosis  Final diagnoses:  Altered mental status, unspecified altered mental status type       Note:  This document was prepared using Dragon voice recognition software and may include unintentional dictation errors.   Lilia Pro., MD 10/08/19 2028

## 2019-11-11 ENCOUNTER — Other Ambulatory Visit
Admission: RE | Admit: 2019-11-11 | Discharge: 2019-11-11 | Disposition: A | Discharge status: Home / Self Care | Payer: Medicare Other | Source: Ambulatory Visit | Attending: Acute Care | Admitting: Acute Care

## 2019-11-11 DIAGNOSIS — Z79899 Other long term (current) drug therapy: Secondary | ICD-10-CM | POA: Diagnosis present

## 2019-11-11 LAB — PHENYTOIN LEVEL, TOTAL: Phenytoin Lvl: 15.7 ug/mL (ref 10.0–20.0)

## 2019-11-27 ENCOUNTER — Other Ambulatory Visit: Payer: Self-pay

## 2019-11-27 ENCOUNTER — Emergency Department: Payer: Medicare Other

## 2019-11-27 ENCOUNTER — Emergency Department
Admission: EM | Admit: 2019-11-27 | Discharge: 2019-11-28 | Disposition: A | Discharge status: Home / Self Care | Payer: Medicare Other | Attending: Emergency Medicine | Admitting: Emergency Medicine

## 2019-11-27 DIAGNOSIS — Z7902 Long term (current) use of antithrombotics/antiplatelets: Secondary | ICD-10-CM | POA: Insufficient documentation

## 2019-11-27 DIAGNOSIS — R569 Unspecified convulsions: Secondary | ICD-10-CM | POA: Insufficient documentation

## 2019-11-27 DIAGNOSIS — E876 Hypokalemia: Secondary | ICD-10-CM | POA: Insufficient documentation

## 2019-11-27 DIAGNOSIS — N390 Urinary tract infection, site not specified: Secondary | ICD-10-CM

## 2019-11-27 DIAGNOSIS — J449 Chronic obstructive pulmonary disease, unspecified: Secondary | ICD-10-CM | POA: Insufficient documentation

## 2019-11-27 DIAGNOSIS — F17210 Nicotine dependence, cigarettes, uncomplicated: Secondary | ICD-10-CM | POA: Diagnosis not present

## 2019-11-27 DIAGNOSIS — J45909 Unspecified asthma, uncomplicated: Secondary | ICD-10-CM | POA: Diagnosis not present

## 2019-11-27 DIAGNOSIS — Z79899 Other long term (current) drug therapy: Secondary | ICD-10-CM | POA: Insufficient documentation

## 2019-11-27 LAB — CBC WITH DIFFERENTIAL/PLATELET
Abs Immature Granulocytes: 0.05 10*3/uL (ref 0.00–0.07)
Basophils Absolute: 0 10*3/uL (ref 0.0–0.1)
Basophils Relative: 0 %
Eosinophils Absolute: 0.1 10*3/uL (ref 0.0–0.5)
Eosinophils Relative: 1 %
HCT: 28.5 % — ABNORMAL LOW (ref 36.0–46.0)
Hemoglobin: 10 g/dL — ABNORMAL LOW (ref 12.0–15.0)
Immature Granulocytes: 1 %
Lymphocytes Relative: 20 %
Lymphs Abs: 1.4 10*3/uL (ref 0.7–4.0)
MCH: 35 pg — ABNORMAL HIGH (ref 26.0–34.0)
MCHC: 35.1 g/dL (ref 30.0–36.0)
MCV: 99.7 fL (ref 80.0–100.0)
Monocytes Absolute: 0.6 10*3/uL (ref 0.1–1.0)
Monocytes Relative: 9 %
Neutro Abs: 4.6 10*3/uL (ref 1.7–7.7)
Neutrophils Relative %: 69 %
Platelets: 293 10*3/uL (ref 150–400)
RBC: 2.86 MIL/uL — ABNORMAL LOW (ref 3.87–5.11)
RDW: 12.7 % (ref 11.5–15.5)
WBC: 6.7 10*3/uL (ref 4.0–10.5)
nRBC: 0 % (ref 0.0–0.2)

## 2019-11-27 LAB — COMPREHENSIVE METABOLIC PANEL
ALT: 31 U/L (ref 0–44)
AST: 28 U/L (ref 15–41)
Albumin: 3.1 g/dL — ABNORMAL LOW (ref 3.5–5.0)
Alkaline Phosphatase: 63 U/L (ref 38–126)
Anion gap: 13 (ref 5–15)
BUN: 13 mg/dL (ref 8–23)
CO2: 27 mmol/L (ref 22–32)
Calcium: 8.9 mg/dL (ref 8.9–10.3)
Chloride: 95 mmol/L — ABNORMAL LOW (ref 98–111)
Creatinine, Ser: 0.79 mg/dL (ref 0.44–1.00)
GFR calc Af Amer: >60 mL/min (ref >60)
GFR calc non Af Amer: >60 mL/min (ref >60)
Glucose, Bld: 103 mg/dL — ABNORMAL HIGH (ref 70–99)
Potassium: 2.7 mmol/L — CL (ref 3.5–5.1)
Sodium: 135 mmol/L (ref 135–145)
Total Bilirubin: 0.4 mg/dL (ref 0.3–1.2)
Total Protein: 6.4 g/dL — ABNORMAL LOW (ref 6.5–8.1)

## 2019-11-27 LAB — MAGNESIUM: Magnesium: 1.9 mg/dL (ref 1.7–2.4)

## 2019-11-27 MED ORDER — POTASSIUM CHLORIDE 10 MEQ/100ML IV SOLN
10.0000 meq | INTRAVENOUS | Status: AC
  Administered 2019-11-27 (×2): 10 meq via INTRAVENOUS
  Filled 2019-11-27 (×2): qty 100

## 2019-11-27 MED ORDER — POTASSIUM CHLORIDE CRYS ER 20 MEQ PO TBCR
40.0000 meq | EXTENDED_RELEASE_TABLET | Freq: Once | ORAL | Status: AC
  Administered 2019-11-27: 40 meq via ORAL
  Filled 2019-11-27: qty 2

## 2019-11-27 NOTE — ED Notes (Signed)
Seizures pads in place.

## 2019-11-27 NOTE — ED Provider Notes (Signed)
-----------------------------------------   11:03 PM on 11/27/2019 -----------------------------------------  Assuming care from Dr. Jari Pigg.  In short, Marcia Brown is a 64 y.o. female with a chief complaint of seizure.  Refer to the original H&P for additional details.  The current plan of care is to follow-up on urinalysis and give time for potassium repletion. Anticipate discharge back to facility given stability and history of known seizure disorder.    ----------------------------------------- 2:47 AM on 11/28/2019 -----------------------------------------  Patient has been stable in the emergency department for 6 hours.  No additional seizure-like activity.  Potassium has been repleted with 40 of oral potassium and 10 of IV potassium and I wrote a prescription for additional potassium supplement as an outpatient.  Urinalysis is suggestive of possible urinary tract infection and a urine culture is pending.  I treated with Keflex 500 mg by mouth and a course of Keflex as an outpatient.  She will follow up as an outpatient with her neurologist and with her primary care physician.  My usual and customary return precautions were provided.   Hinda Kehr, MD 11/28/19 [phone]

## 2019-11-27 NOTE — ED Triage Notes (Signed)
Pt to ED via [facility] from [facility] for chief complaint multiple seizures today, one this morning and one this evening reported. Hx TBI and seizure reported. Pt alert, oriented to person only.  Denies pain. Pt denies recollection of having seizures today

## 2019-11-27 NOTE — ED Provider Notes (Signed)
[facility] Emergency Department Provider Note  ____________________________________________   First MD Initiated Contact with Patient 11/27/19 2054     (approximate)  I have reviewed the triage vital signs and the nursing notes.   HISTORY  Chief Complaint Seizures    HPI Marcia Brown is a 64 y.o. 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  Patient takes Vimpat 150 mg twice daily, Lamictal 400 on Monday Wednesday Friday and then on the other days a week Lamictal 200 and Keppra 500 in the morning and 750 at night and Dilantin 100 mg twice daily.  D/w nurse- having seizure when smoking. It was her whole body shaking, afterwards staring and not as responsive and not following commands, and confused. Shaking lasted about 5 minutes-15 minutes. Unclear if hit head. She has been taking her medications.  The seizure occurred at 8 PM 8pm.  There was concern for having multiple seizures but I discussed with the nurse and she stated that the roommate said she might of had a seizure this morning but that there was not report of any from the other nurse related not think that this was true.  Patient self denies remembering the seizures.  However she does report a history of seizures.  She denies any concerns at this time.           Past Medical History:  Diagnosis Date  . Anxiety   . Asthma   . Cerebral ischemia   . COPD (chronic obstructive pulmonary disease) ([place])   . Depression   . High cholesterol   . Polyneuropathy   . Seizures ([place])   . Sleep apnea     Patient Active Problem List   Diagnosis Date Noted  . Seizures ([place]) 08/04/2019  . Acute lower UTI 06/26/2019  . Hypokalemia 06/26/2019  . AMS (altered mental status) 06/26/2019  . Tobacco abuse 12/27/2015  . Repeated falls 05/24/2015  . Seizure ([place]) 02/03/2015  . 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 ESOPHAGEAL DILATION    .  FOOT SURGERY Right     Prior to Admission medications   Medication Sig Start Date End Date Taking? Authorizing Provider  albuterol (PROVENTIL HFA;VENTOLIN HFA) 108 (90 BASE) MCG/ACT inhaler Inhale 2 puffs into the lungs every 4 (four) hours as needed for wheezing or shortness of breath.    [provider]  alum & mag hydroxide-simeth (ANTACID LIQUID) 200-200-20 MG/5ML suspension Take 30 mLs by mouth every 6 (six) hours as needed for indigestion or heartburn.    [provider]  atorvastatin (LIPITOR) 10 MG tablet Take 10 mg by mouth at bedtime.     [provider]  cholecalciferol (VITAMIN D) 1000 units tablet Take 2,000 Units by mouth daily. Give with Vitamin D3 400 units    [provider]  cholecalciferol (VITAMIN D) 400 UNITS TABS tablet Take 400 Units by mouth daily. Give with Vitamin D3 2000 units    [provider]  clonazePAM (KLONOPIN) 0.5 MG tablet Take 0.5 mg by mouth 3 (three) times daily.     [provider]  clopidogrel (PLAVIX) 75 MG tablet Take 75 mg by mouth daily. 06/19/19   [provider]  cyanocobalamin 500 MCG tablet Take 500 mcg by mouth daily.    [provider]  diclofenac sodium (VOLTAREN) 1 % GEL Apply 4 g topically 3 (three) times daily. Apply to both knees    [provider]  diphenhydrAMINE (BENADRYL) 25 MG tablet Take 25 mg by mouth every 6 (six) hours as needed for allergies.    [provider]  DULoxetine (CYMBALTA) 60 MG capsule Take 60 mg by mouth daily.    [provider]  ferrous sulfate 325 (65 FE) MG tablet Take 325 mg by mouth 3 (three) times daily with meals.    [provider]  gabapentin (NEURONTIN) 100 MG capsule Take 200 mg by mouth 2 (two) times daily.    [provider]  lamoTRIgine (LAMICTAL) 200 MG tablet Take 200-400 mg by mouth daily. Pt takes 2 tablets (400 mg) in the morning on Monday, Wednesday and Friday and only 1 tablet (200 mg)  on all other days in the morning.    [provider]  levETIRAcetam (KEPPRA) 500 MG tablet Take 500-750 mg by mouth 2 (two) times daily. Take one tablet (500) by mouth daily in the morning and one and one-half tablets (750 mg) in the evening 06/19/19   [provider]  loperamide (IMODIUM) 2 MG capsule Take 4 mg by mouth as needed for diarrhea or loose stools.    [provider]  magnesium hydroxide (MILK OF MAGNESIA) 400 MG/5ML suspension Take 30 mLs by mouth daily as needed for mild constipation or moderate constipation.    [provider]  mometasone-formoterol (DULERA) 100-5 MCG/ACT AERO Inhale 2 puffs into the lungs 2 (two) times daily.    [provider]  pantoprazole (PROTONIX) 40 MG tablet Take 40 mg by mouth daily before breakfast.     [provider]  phenytoin (DILANTIN) 100 MG ER capsule Take 100 mg by mouth 2 (two) times daily.     [provider]  QUEtiapine (SEROQUEL) 25 MG tablet Take 25 mg by mouth at bedtime. 06/19/19   [provider]  sucralfate (CARAFATE) 1 g tablet Take 1 g by mouth 4 (four) times daily. 06/19/19   [provider]  VIMPAT 150 MG TABS Take 1 tablet by mouth 2 (two) times daily. 06/03/19   [provider]    Allergies Ciprofloxacin and Aspirin  Family History  Problem Relation Age of Onset  . Breast cancer Daughter 15    Social History Social History   Tobacco Use  . Smoking status: Current Every Day Smoker    Packs/day: 0.50    Types: Cigarettes  . Smokeless tobacco: Never Used  Substance Use Topics  . Alcohol use: No  . Drug use: No      Review of Systems Constitutional: No fever/chills Eyes: No visual changes. ENT: No sore throat. Cardiovascular: Denies chest pain. Respiratory: Denies shortness of breath. Gastrointestinal: No abdominal pain.  No nausea, no vomiting.  No diarrhea.  No constipation. Genitourinary: Negative for dysuria. Musculoskeletal:  Negative for back pain. Skin: Negative for rash. Neurological: Negative for headaches, focal weakness or numbness.  Positive concern for seizure All other ROS negative ____________________________________________   PHYSICAL EXAM:  VITAL SIGNS: ED Triage Vitals  Enc Vitals Group     BP 11/27/19 2047 (!) 143/71     Pulse Rate 11/27/19 2046 62     Resp 11/27/19 2046 16     Temp 11/27/19 2046 99.2 F (37.3 C)     Temp Source 11/27/19 2046 Oral     SpO2 11/27/19 2046 95 %     Weight 11/27/19 2047 170 lb (77.1 kg)     Height 11/27/19 2047 5\' 1"  (1.549 m)     Head Circumference --  Peak Flow --      Pain Score 11/27/19 2047 0     Pain Loc --      Pain Edu? --      Excl. in [place]? --     Constitutional: Alert Well appearing and in no acute distress. Eyes: Conjunctivae are normal. EOMI. Head: Atraumatic. Nose: No congestion/rhinnorhea. Mouth/Throat: Mucous membranes are moist.   Neck: No stridor. Trachea Midline. FROM Cardiovascular: Normal rate, regular rhythm. Grossly normal heart sounds.  Good peripheral circulation. Respiratory: Normal respiratory effort.  No retractions. Lungs CTAB. Gastrointestinal: Soft and nontender. No distention. No abdominal bruits.  Musculoskeletal: No lower extremity tenderness nor edema.  No joint effusions. Neurologic:  Normal speech and language.  Facial droop noted but seems chronic according to prior notes.  Equal strength in arms and legs. Skin:  Skin is warm, dry and intact. No rash noted. Psychiatric: Mood and affect are normal. Speech and behavior are normal. GU: Deferred   ____________________________________________   LABS (all labs ordered are listed, but only abnormal results are displayed)  Labs Reviewed  CBC WITH DIFFERENTIAL/PLATELET - Abnormal; Notable for the following components:      Result Value   RBC 2.86 (*)    Hemoglobin 10.0 (*)    HCT 28.5 (*)    MCH 35.0 (*)    All other components within normal limits    COMPREHENSIVE METABOLIC PANEL - Abnormal; Notable for the following components:   Potassium 2.7 (*)    Chloride 95 (*)    Glucose, Bld 103 (*)    Total Protein 6.4 (*)    Albumin 3.1 (*)    All other components within normal limits  MAGNESIUM  URINALYSIS, COMPLETE (UACMP) WITH MICROSCOPIC  LAMOTRIGINE LEVEL  LEVETIRACETAM LEVEL  PHENYTOIN LEVEL, FREE AND TOTAL   ____________________________________________   ED ECG REPORT I, Vanessa [place], the attending physician, personally viewed and interpreted this ECG.  Normal sinus rate of 64, no ST elevations, no T wave inversions, normal intervals ____________________________________________  RADIOLOGY   Official radiology report(s): CT Head Wo Contrast  Result Date: 11/27/2019 CLINICAL DATA:  Multiple seizures EXAM: CT HEAD WITHOUT CONTRAST TECHNIQUE: Contiguous axial images were obtained from the base of the skull through the vertex without intravenous contrast. COMPARISON:  None. FINDINGS: Brain: No evidence of acute territorial infarction, hemorrhage, hydrocephalus,extra-axial collection or mass lesion/mass effect. There is dilatation the ventricles and sulci consistent with age-related atrophy most notable in the frontal lobes. Area of encephalomalacia in the inferior right temporal lobe. Low-attenuation changes in the deep white matter consistent with small vessel ischemia. Vascular: No hyperdense vessel or unexpected calcification. Skull: The skull is intact. No fracture or focal lesion identified. 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 Sinuses/Orbits: The visualized paranasal sinuses and mastoid air cells are clear. The orbits and globes intact. Other: None Spine: Alignment: Physiologic Skull base and vertebrae: Visualized skull base is intact. No atlanto-occipital dissociation. The vertebral body heights are well maintained. No fracture or pathologic osseous lesion seen. Soft tissues and spinal canal: The visualized paraspinal  soft tissues are unremarkable. No prevertebral soft tissue swelling is seen. The spinal canal is grossly unremarkable, no large epidural collection or significant canal narrowing. Disc levels: Multilevel cervical spine spondylosis is seen with disc height and uncovertebral osteophytes most notable at C4 through C6 moderate to severe neural foraminal narrowing and central canal stenosis. Upper chest: The lung apices are clear. Thoracic inlet is within normal limits. Other: None IMPRESSION: No acute intracranial abnormality. Findings  consistent with age related atrophy and chronic small vessel ischemia No acute fracture or malalignment of the spine. Electronically Signed   By: Prudencio Pair M.D.   On: 11/27/2019 22:03   CT Cervical Spine Wo Contrast  Result Date: 11/27/2019 CLINICAL DATA:  Multiple seizures EXAM: CT HEAD WITHOUT CONTRAST TECHNIQUE: Contiguous axial images were obtained from the base of the skull through the vertex without intravenous contrast. COMPARISON:  None. FINDINGS: Brain: No evidence of acute territorial infarction, hemorrhage, hydrocephalus,extra-axial collection or mass lesion/mass effect. There is dilatation the ventricles and sulci consistent with age-related atrophy most notable in the frontal lobes. Area of encephalomalacia in the inferior right temporal lobe. Low-attenuation changes in the deep white matter consistent with small vessel ischemia. Vascular: No hyperdense vessel or unexpected calcification. Skull: The skull is intact. No fracture or focal lesion identified. 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 Sinuses/Orbits: The visualized paranasal sinuses and mastoid air cells are clear. The orbits and globes intact. Other: None Spine: Alignment: Physiologic Skull base and vertebrae: Visualized skull base is intact. No atlanto-occipital dissociation. The vertebral body heights are well maintained. No fracture or pathologic osseous lesion seen. Soft tissues and spinal canal:  The visualized paraspinal soft tissues are unremarkable. No prevertebral soft tissue swelling is seen. The spinal canal is grossly unremarkable, no large epidural collection or significant canal narrowing. Disc levels: Multilevel cervical spine spondylosis is seen with disc height and uncovertebral osteophytes most notable at C4 through C6 moderate to severe neural foraminal narrowing and central canal stenosis. Upper chest: The lung apices are clear. Thoracic inlet is within normal limits. Other: None IMPRESSION: No acute intracranial abnormality. Findings consistent with age related atrophy and chronic small vessel ischemia No acute fracture or malalignment of the spine. Electronically Signed   By: Prudencio Pair M.D.   On: 11/27/2019 22:03    ____________________________________________   PROCEDURES  Procedure(s) performed (including Critical Care):  Procedures   ____________________________________________   INITIAL IMPRESSION / ASSESSMENT AND PLAN / ED COURSE  Marcia Brown was evaluated in Emergency Department on 11/27/2019 for the symptoms described in the history of present illness. She was evaluated in the context of the global COVID-19 pandemic, which necessitated consideration that the patient might be at risk for infection with the SARS-CoV-2 virus that causes COVID-19. Institutional protocols and algorithms that pertain to the evaluation of patients at risk for COVID-19 are in a state of rapid change based on information released by regulatory bodies including the CDC and federal and state organizations. These policies and algorithms were followed during the patient's care in the ED.    Patient is a 64 year old with history of seizures who comes in with concern for seizure.  It appears that patient does have some breakthrough seizures.  She is followed by Dr. Melrose Nakayama and is on 4 different medications.  At this time I have low suspicion for status epilepticus.  She is no longer seizing.   She appears to be slightly postictal.  But does follow commands.  Will get labs to evaluate for Electra abnormalities, AKI, UTI and given not sure she hit her head will get CT head to evaluate for intracranial hemorrhage and CT for cervical evaluate for cervical fracture  CT imaging is negative  Labs show slightly low potassium at 2.7.  Given 20 IV and 40 of p.o. potassium.  12:05 AM she is more alert and is able to ambulate to the bathroom.  She denies any concerns this at this time other  than that she is to urinate.  Will get urine sample.  Patient's been ER for greater than 3 hours without recurrent seizure.  She is handed off to oncoming team pending her urine sample most likely discharge home if she continues to be seizure-free       ____________________________________________   FINAL CLINICAL IMPRESSION(S) / ED DIAGNOSES   Final diagnoses:  Seizure ([place])  Hypokalemia      MEDICATIONS GIVEN DURING THIS VISIT:  Medications  potassium chloride 10 mEq in 100 mL IVPB (10 mEq Intravenous New Bag/Given 11/27/19 2330)  potassium chloride SA (KLOR-CON) CR tablet 40 mEq (40 mEq Oral Given 11/27/19 2221)     ED Discharge Orders    None       Note:  This document was prepared using Dragon voice recognition software and may include unintentional dictation errors.   Vanessa [place], MD 11/28/19 [phone]

## 2019-11-27 NOTE — ED Notes (Signed)
Pt is laying in bed. Pt has history of brain injury and seizures. Pt states no pain right now.

## 2019-11-27 NOTE — ED Notes (Signed)
Dr Jari Pigg notified of K 2.7, new orders to be placed

## 2019-11-27 NOTE — ED Notes (Signed)
Dr funke at bedside 

## 2019-11-28 DIAGNOSIS — R569 Unspecified convulsions: Secondary | ICD-10-CM | POA: Diagnosis not present

## 2019-11-28 LAB — URINALYSIS, COMPLETE (UACMP) WITH MICROSCOPIC
Bilirubin Urine: NEGATIVE
Glucose, UA: NEGATIVE mg/dL
Ketones, ur: NEGATIVE mg/dL
Nitrite: NEGATIVE
Protein, ur: NEGATIVE mg/dL
Specific Gravity, Urine: 1.003 — ABNORMAL LOW (ref 1.005–1.030)
pH: 6 (ref 5.0–8.0)

## 2019-11-28 MED ORDER — POTASSIUM CHLORIDE CRYS ER 20 MEQ PO TBCR: 20.0000 meq | EXTENDED_RELEASE_TABLET | Freq: Every day | ORAL | 0 refills | Status: DC

## 2019-11-28 MED ORDER — CEPHALEXIN 500 MG PO CAPS: 500.0000 mg | ORAL_CAPSULE | Freq: Two times a day (BID) | ORAL | 0 refills | Status: DC

## 2019-11-28 MED ORDER — CEPHALEXIN 500 MG PO CAPS
500.0000 mg | ORAL_CAPSULE | Freq: Once | ORAL | Status: AC
  Administered 2019-11-28: 500 mg via ORAL
  Filled 2019-11-28: qty 1

## 2019-11-28 NOTE — ED Notes (Signed)
Jasper called and report given to RN. Enis Gash, RN

## 2019-11-28 NOTE — Discharge Instructions (Addendum)
Most likely had a seizure today that was a breakthrough seizure.  You to call Dr. Lannie Fields office on Monday to see if your medicines need to be readjusted.  Return to the ER if you develop recurrent seizure or any other concern.  Her potassium was slightly low and we did replete that while she was in the emergency room.  This can be rechecked in another week.  We also provided a prescription for potassium supplement to take over the next week and a course of antibiotics for a probable urinary tract infection.  Urine culture is pending.

## 2019-11-30 LAB — URINE CULTURE
Culture: >=100000 — AB
Special Requests: NORMAL

## 2019-12-01 NOTE — Consult Note (Signed)
Patient had a positive Ucx for ESBL ecoli. No symptoms were noted. Discussed case with Dr. Joni Fears. Patient did not state any symptoms of UTI in the notes. Team agrees not to treat the Ucx as it is possibly colonization and pt has asymptotic bacteruria. 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 Pt was sent home on keflex.   Thanks,   Eleonore Chiquito, PharmD, BCPS.

## 2019-12-04 ENCOUNTER — Telehealth: Payer: Self-pay | Admitting: Emergency Medicine

## 2019-12-04 LAB — PHENYTOIN LEVEL, FREE AND TOTAL
Phenytoin, Free: 0.7 ug/mL — ABNORMAL LOW (ref 1.0–2.0)
Phenytoin, Total: 8 ug/mL — ABNORMAL LOW (ref 10.0–20.0)

## 2019-12-04 LAB — LAMOTRIGINE LEVEL: Lamotrigine Lvl: 2.8 ug/mL (ref 2.0–20.0)

## 2019-12-04 LAB — LEVETIRACETAM LEVEL: Levetiracetam Lvl: 17.4 ug/mL (ref 10.0–40.0)

## 2019-12-04 NOTE — Telephone Encounter (Signed)
Called the [facility] and gave results for phenytoin levels to Tia.  Also informed her that the urine culture is back and the patient's doctor needs to review it as it is resistant to several antibiotics.

## 2020-02-21 ENCOUNTER — Emergency Department
Admission: EM | Admit: 2020-02-21 | Discharge: 2020-02-21 | Disposition: A | Discharge status: Home / Self Care | Payer: Medicare Other | Attending: Emergency Medicine | Admitting: Emergency Medicine

## 2020-02-21 ENCOUNTER — Emergency Department: Payer: Medicare Other

## 2020-02-21 ENCOUNTER — Other Ambulatory Visit: Payer: Self-pay

## 2020-02-21 ENCOUNTER — Encounter: Payer: Self-pay | Admitting: Emergency Medicine

## 2020-02-21 DIAGNOSIS — Y92129 Unspecified place in nursing home as the place of occurrence of the external cause: Secondary | ICD-10-CM | POA: Insufficient documentation

## 2020-02-21 DIAGNOSIS — Z79899 Other long term (current) drug therapy: Secondary | ICD-10-CM | POA: Diagnosis not present

## 2020-02-21 DIAGNOSIS — S0003XA Contusion of scalp, initial encounter: Secondary | ICD-10-CM | POA: Diagnosis not present

## 2020-02-21 DIAGNOSIS — Y9389 Activity, other specified: Secondary | ICD-10-CM | POA: Insufficient documentation

## 2020-02-21 DIAGNOSIS — M545 Low back pain: Secondary | ICD-10-CM | POA: Diagnosis not present

## 2020-02-21 DIAGNOSIS — W19XXXA Unspecified fall, initial encounter: Secondary | ICD-10-CM | POA: Diagnosis not present

## 2020-02-21 DIAGNOSIS — F17210 Nicotine dependence, cigarettes, uncomplicated: Secondary | ICD-10-CM | POA: Insufficient documentation

## 2020-02-21 DIAGNOSIS — J45909 Unspecified asthma, uncomplicated: Secondary | ICD-10-CM | POA: Insufficient documentation

## 2020-02-21 DIAGNOSIS — S4991XA Unspecified injury of right shoulder and upper arm, initial encounter: Secondary | ICD-10-CM | POA: Diagnosis present

## 2020-02-21 DIAGNOSIS — Y999 Unspecified external cause status: Secondary | ICD-10-CM | POA: Insufficient documentation

## 2020-02-21 DIAGNOSIS — Z7951 Long term (current) use of inhaled steroids: Secondary | ICD-10-CM | POA: Diagnosis not present

## 2020-02-21 DIAGNOSIS — S40211A Abrasion of right shoulder, initial encounter: Secondary | ICD-10-CM | POA: Diagnosis not present

## 2020-02-21 DIAGNOSIS — R519 Headache, unspecified: Secondary | ICD-10-CM | POA: Diagnosis not present

## 2020-02-21 MED ORDER — ACETAMINOPHEN 500 MG PO TABS
1000.0000 mg | ORAL_TABLET | Freq: Once | ORAL | Status: AC
  Administered 2020-02-21: 1000 mg via ORAL
  Filled 2020-02-21: qty 2

## 2020-02-21 NOTE — ED Provider Notes (Signed)
[facility] Emergency Department Provider Note  ____________________________________________   First MD Initiated Contact with Patient 02/21/20 1145     (approximate)  I have reviewed the triage vital signs and the nursing notes.   HISTORY  Chief Complaint Fall   HPI Marcia Brown is a 64 y.o. female with a past medical history of COPD, asthma, anxiety, previous CVA, seizure disorder, OSA, and depression who presents via EMS from nursing facility after concern for a fall.  Patient states he does not remember What happened remembers waking up on the floor feeling headache.  She was still left in her cover so she suspects she rolled out of bed.  She also endorses some pain over her right shoulder but denies any other acute pain including in her neck, back, left shoulder, chest, abdomen, lower extremities, or elsewhere.  Not sure if she had LOC.  She is also not sure if she is anticoagulated.  She denies any recent fevers, chills, cough, vomiting, diarrhea, or other acute physical symptoms.  She is not oriented to recent medical history or date and I did attempt to reach staff twice at her nursing facility as well as for my most of the chart but was unable to reach anyone.  No other history history is available on patient presentation.         Past Medical History:  Diagnosis Date  . Anxiety   . Asthma   . Cerebral ischemia   . COPD (chronic obstructive pulmonary disease) ([place])   . Depression   . High cholesterol   . Polyneuropathy   . Seizures ([place])   . Sleep apnea     Patient Active Problem List   Diagnosis Date Noted  . Seizures ([place]) 08/04/2019  . Acute lower UTI 06/26/2019  . Hypokalemia 06/26/2019  . AMS (altered mental status) 06/26/2019  . Tobacco abuse 12/27/2015  . Repeated falls 05/24/2015  . Seizure ([place]) 02/03/2015  . 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 ESOPHAGEAL DILATION    . FOOT  SURGERY Right     Prior to Admission medications   Medication Sig Start Date End Date Taking? Authorizing Provider  albuterol (PROVENTIL HFA;VENTOLIN HFA) 108 (90 BASE) MCG/ACT inhaler Inhale 2 puffs into the lungs every 4 (four) hours as needed for wheezing or shortness of breath.    [provider]  alum & mag hydroxide-simeth (ANTACID LIQUID) 200-200-20 MG/5ML suspension Take 30 mLs by mouth every 6 (six) hours as needed for indigestion or heartburn.    [provider]  atorvastatin (LIPITOR) 10 MG tablet Take 10 mg by mouth at bedtime.     [provider]  cephALEXin (KEFLEX) 500 MG capsule Take 1 capsule (500 mg total) by mouth 2 (two) times daily. 11/28/19   Hinda Kehr, MD  cholecalciferol (VITAMIN D) 1000 units tablet Take 2,000 Units by mouth daily. Give with Vitamin D3 400 units    [provider]  cholecalciferol (VITAMIN D) 400 UNITS TABS tablet Take 400 Units by mouth daily. Give with Vitamin D3 2000 units    [provider]  clonazePAM (KLONOPIN) 0.5 MG tablet Take 0.5 mg by mouth 3 (three) times daily.     [provider]  clopidogrel (PLAVIX) 75 MG tablet Take 75 mg by mouth daily. 06/19/19   [provider]  cyanocobalamin 500 MCG tablet Take 500 mcg by mouth daily.    [provider]  diclofenac sodium (  VOLTAREN) 1 % GEL Apply 4 g topically 3 (three) times daily. Apply to both knees    [provider]  diphenhydrAMINE (BENADRYL) 25 MG tablet Take 25 mg by mouth every 6 (six) hours as needed for allergies.    [provider]  DULoxetine (CYMBALTA) 60 MG capsule Take 60 mg by mouth daily.    [provider]  ferrous sulfate 325 (65 FE) MG tablet Take 325 mg by mouth 3 (three) times daily with meals.    [provider]  gabapentin (NEURONTIN) 100 MG capsule Take 200 mg by mouth 2 (two) times daily.    [provider]  lamoTRIgine (LAMICTAL) 200 MG tablet Take 200-400  mg by mouth daily. Pt takes 2 tablets (400 mg) in the morning on Monday, Wednesday and Friday and only 1 tablet (200 mg) on all other days in the morning.    [provider]  levETIRAcetam (KEPPRA) 500 MG tablet Take 500-750 mg by mouth 2 (two) times daily. Take one tablet (500) by mouth daily in the morning and one and one-half tablets (750 mg) in the evening 06/19/19   [provider]  loperamide (IMODIUM) 2 MG capsule Take 4 mg by mouth as needed for diarrhea or loose stools.    [provider]  magnesium hydroxide (MILK OF MAGNESIA) 400 MG/5ML suspension Take 30 mLs by mouth daily as needed for mild constipation or moderate constipation.    [provider]  mometasone-formoterol (DULERA) 100-5 MCG/ACT AERO Inhale 2 puffs into the lungs 2 (two) times daily.    [provider]  pantoprazole (PROTONIX) 40 MG tablet Take 40 mg by mouth daily before breakfast.     [provider]  phenytoin (DILANTIN) 100 MG ER capsule Take 100 mg by mouth 2 (two) times daily.     [provider]  potassium chloride SA (KLOR-CON M20) 20 MEQ tablet Take 1 tablet (20 mEq total) by mouth daily. 11/28/19   Hinda Kehr, MD  QUEtiapine (SEROQUEL) 25 MG tablet Take 25 mg by mouth at bedtime. 06/19/19   [provider]  sucralfate (CARAFATE) 1 g tablet Take 1 g by mouth 4 (four) times daily. 06/19/19   [provider]  VIMPAT 150 MG TABS Take 1 tablet by mouth 2 (two) times daily. 06/03/19   [provider]    Allergies Ciprofloxacin and Aspirin  Family History  Problem Relation Age of Onset  . Breast cancer Daughter 68    Social History Social History   Tobacco Use  . Smoking status: Current Every Day Smoker    Packs/day: 0.50    Types: Cigarettes  . Smokeless tobacco: Never Used  Substance Use Topics  . Alcohol use: No  . Drug use: No    Review of Systems  Review of Systems  Constitutional: Negative for chills and  fever.  HENT: Negative for sore throat.   Eyes: Negative for pain.  Respiratory: Negative for cough and stridor.   Cardiovascular: Negative for chest pain.  Gastrointestinal: Negative for vomiting.  Musculoskeletal: Positive for back pain ( R shoulder blade) and myalgias ( R shoulder).  Skin: Negative for rash.  Neurological: Positive for headaches. Negative for seizures and loss of consciousness.  Psychiatric/Behavioral: Negative for suicidal ideas.  All other systems reviewed and are negative.     ____________________________________________   PHYSICAL EXAM:  VITAL SIGNS: ED Triage Vitals  Enc Vitals Group     BP 02/21/20 0842 124/64     Pulse Rate  02/21/20 0842 (!) 53     Resp 02/21/20 0842 18     Temp 02/21/20 0842 99.3 F (37.4 C)     Temp Source 02/21/20 0842 Oral     SpO2 02/21/20 0842 97 %     Weight 02/21/20 0844 174 lb (78.9 kg)     Height 02/21/20 0844 5\' 6"  (1.676 m)     Head Circumference --      Peak Flow --      Pain Score 02/21/20 0843 8     Pain Loc --      Pain Edu? --      Excl. in [place]? --    Vitals:   02/21/20 0842 02/21/20 1059  BP: 124/64 140/82  Pulse: (!) 53 (!) 55  Resp: 18 17  Temp: 99.3 F (37.4 C) 98.3 F (36.8 C)  SpO2: 97% 95%   Physical Exam Vitals and nursing note reviewed.  Constitutional:      General: She is not in acute distress.    Appearance: She is well-developed.  HENT:     Head: Normocephalic and atraumatic.     Right Ear: External ear normal.     Left Ear: External ear normal.     Nose: Nose normal.     Mouth/Throat:     Mouth: Mucous membranes are moist.  Eyes:     Conjunctiva/sclera: Conjunctivae normal.  Cardiovascular:     Rate and Rhythm: Normal rate and regular rhythm.     Heart sounds: No murmur heard.   Pulmonary:     Effort: Pulmonary effort is normal. No respiratory distress.     Breath sounds: Normal breath sounds.  Abdominal:     Palpations: Abdomen is soft.     Tenderness: There is no  abdominal tenderness.  Musculoskeletal:     Cervical back: Neck supple.  Skin:    General: Skin is warm and dry.  Neurological:     Mental Status: She is alert. Mental status is at baseline. She is confused.     Comments: Per refuse of prior records. Unable to reach staff at facility caring for patient.      Small 1 cm hematoma over the right parietal scalp as well as a small hematoma over the right forehead.  There is a small abrasion over the right scapula.  No tenderness or deformity over the C/T/L-spine.  2+ bilateral radial pulses and DP pulses.  Cranial nerves II through XII grossly intact.  Patient has symmetric strength in all extremities and sensation is intact to light touch in all extremities.  No other obvious evidence of trauma to the patient's back, extremities, chest, abdomen, face, or elsewhere.  Oropharynx is unremarkable.  Patient does have full range of motion at the bilateral shoulders including at the right.  There is no deformity or large effusion. ____________________________________________  ____________________________________________  RADIOLOGY  ED MD interpretation: No acute skull fracture or intracranial bleeding, C-spine fracture or acute injury or evidence of orthopedic injury on the patient's chest x-ray, shoulder x-ray, and elbow x-ray.  Official radiology report(s): DG Chest 1 View  Result Date: 02/21/2020 CLINICAL DATA:  [place]. EXAM: CHEST  1 VIEW COMPARISON:  08/04/2019 FINDINGS: The heart is upper limits of normal in size given the AP projection and portable technique. The mediastinal and hilar contours are within normal limits and are stable. Large hiatal hernia again noted. Stable mild eventration of the right hemidiaphragm. The lungs are clear of an acute process. No pleural effusion or pneumothorax.  Multiple healed right rib fractures are noted. No definite acute rib fractures. IMPRESSION: 1. No acute cardiopulmonary findings. 2. Large hiatal hernia.  Electronically Signed   By: Marijo Sanes M.D.   On: 02/21/2020 12:24   DG Shoulder Right  Result Date: 02/21/2020 CLINICAL DATA:  [place].  Right shoulder pain. EXAM: RIGHT SHOULDER - 2+ VIEW COMPARISON:  None. FINDINGS: Mild glenohumeral and moderate AC joint degenerative changes but no acute fracture or dislocation involving the right shoulder. The visualized right ribs do not demonstrate any definite acute rib fractures. Remote healed fractures are noted. The visualized right lung is clear. IMPRESSION: No acute fracture or dislocation. Electronically Signed   By: Marijo Sanes M.D.   On: 02/21/2020 12:23   CT Head Wo Contrast  Result Date: 02/21/2020 CLINICAL DATA:  [place] out of bed this morning. EXAM: CT HEAD WITHOUT CONTRAST CT CERVICAL SPINE WITHOUT CONTRAST TECHNIQUE: Multidetector CT imaging of the head and cervical spine was performed following the standard protocol without intravenous contrast. Multiplanar CT image reconstructions of the cervical spine were also generated. COMPARISON:  Multiple prior head CTs and brain MRIs. The most recent CT scan is 11/27/2019. FINDINGS: CT HEAD FINDINGS Brain: Stable age advanced cerebral atrophy. No ventriculomegaly or significant periventricular white matter disease. Chronic encephalomalacia noted in the right temporal lobe. No extra-axial fluid collections are identified. No CT findings for acute hemispheric infarction or intracranial hemorrhage. No mass lesions. The brainstem and cerebellum are normal. Vascular: Stable vascular calcifications. No aneurysm or hyperdense vessels. Skull: No skull fracture or bone lesion. Evidence of prior right temporal craniotomy. Sinuses/Orbits: The paranasal sinuses and mastoid air cells are clear. The globes are intact. Other: No scalp lesion or hematoma. CT CERVICAL SPINE FINDINGS Alignment: Normal Skull base and vertebrae: Fell no acute skull base or vertebral body fractures. Stable moderate degenerative cervical spondylosis  with multilevel disc disease and facet disease. The facets are normally aligned. No facet or laminar fractures. Soft tissues and spinal canal: No prevertebral fluid or swelling. No visible canal hematoma. Disc levels: The spinal canal is fairly generous. No large disc protrusions, significant spinal or foraminal stenosis. Upper chest: The lung apices are grossly clear. Other: No significant findings. IMPRESSION: 1. Stable age advanced cerebral atrophy. 2. Chronic encephalomalacia in the right temporal lobe. 3. No acute intracranial findings or skull fracture. 4. Normal alignment of the cervical vertebral bodies and no acute cervical spine fracture. 5. Stable moderate degenerative cervical spondylosis with multilevel disc disease and facet disease. Electronically Signed   By: Marijo Sanes M.D.   On: 02/21/2020 10:02   CT Cervical Spine Wo Contrast  Result Date: 02/21/2020 CLINICAL DATA:  [place] out of bed this morning. EXAM: CT HEAD WITHOUT CONTRAST CT CERVICAL SPINE WITHOUT CONTRAST TECHNIQUE: Multidetector CT imaging of the head and cervical spine was performed following the standard protocol without intravenous contrast. Multiplanar CT image reconstructions of the cervical spine were also generated. COMPARISON:  Multiple prior head CTs and brain MRIs. The most recent CT scan is 11/27/2019. FINDINGS: CT HEAD FINDINGS Brain: Stable age advanced cerebral atrophy. No ventriculomegaly or significant periventricular white matter disease. Chronic encephalomalacia noted in the right temporal lobe. No extra-axial fluid collections are identified. No CT findings for acute hemispheric infarction or intracranial hemorrhage. No mass lesions. The brainstem and cerebellum are normal. Vascular: Stable vascular calcifications. No aneurysm or hyperdense vessels. Skull: No skull fracture or bone lesion. Evidence of prior right temporal craniotomy. Sinuses/Orbits: The paranasal sinuses and mastoid air cells are  clear. The globes  are intact. Other: No scalp lesion or hematoma. CT CERVICAL SPINE FINDINGS Alignment: Normal Skull base and vertebrae: Fell no acute skull base or vertebral body fractures. Stable moderate degenerative cervical spondylosis with multilevel disc disease and facet disease. The facets are normally aligned. No facet or laminar fractures. Soft tissues and spinal canal: No prevertebral fluid or swelling. No visible canal hematoma. Disc levels: The spinal canal is fairly generous. No large disc protrusions, significant spinal or foraminal stenosis. Upper chest: The lung apices are grossly clear. Other: No significant findings. IMPRESSION: 1. Stable age advanced cerebral atrophy. 2. Chronic encephalomalacia in the right temporal lobe. 3. No acute intracranial findings or skull fracture. 4. Normal alignment of the cervical vertebral bodies and no acute cervical spine fracture. 5. Stable moderate degenerative cervical spondylosis with multilevel disc disease and facet disease. Electronically Signed   By: Marijo Sanes M.D.   On: 02/21/2020 10:02   DG Pelvis Portable  Result Date: 02/21/2020 CLINICAL DATA:  [place]. EXAM: PORTABLE PELVIS 1-2 VIEWS COMPARISON:  04/03/2018 FINDINGS: Both hips are normally located. No acute hip fracture. The pubic symphysis and SI joints are intact. No pelvic fractures or bone lesions are identified. IMPRESSION: No acute bony findings. Electronically Signed   By: Marijo Sanes M.D.   On: 02/21/2020 12:25    ____________________________________________   PROCEDURES  Procedure(s) performed (including Critical Care):  Procedures   ____________________________________________   INITIAL IMPRESSION / ASSESSMENT AND PLAN / ED COURSE       Patient presents with [place] to history exam for assessment after concerns for a fall after rolling out of bed at her nursing facility.  Patient is afebrile hemodynamically stable on arrival.  Exam as above remarkable for hematoma over the right  parietal scalp, right forehead, and abrasion over the right scapula.  No other evidence of trauma on exam and patient is neurovascularly intact in all extremities.  Review of patient's medication records so she is not currently on anticoagulation.  In addition, on review of records patient does have history of dementia. While she is oriented to month and year, it seems she is at her baseline not being exactly oriented to date or recent medical history.  Low suspicion for acute infectious process, significant metabolic derangement, ACS, or other immediate life threatening pathology.  Impression is superficial hematomas and abrasion.  Patient given Tylenol.  Discharged stable condition.  Strict return precautions provided in writing back to facility. ____________________________________________   FINAL CLINICAL IMPRESSION(S) / ED DIAGNOSES  Final diagnoses:  Fall, initial encounter  Abrasion of right shoulder, initial encounter  Hematoma of scalp, initial encounter    Medications  acetaminophen (TYLENOL) tablet 1,000 mg (has no administration in time range)     ED Discharge Orders    None       Note:  This document was prepared using Dragon voice recognition software and may include unintentional dictation errors.   Lucrezia Starch, MD 02/21/20 1235

## 2020-02-21 NOTE — ED Notes (Signed)
Attempted report back to [facility] for pt being discharged. On hold for 5 minutes. Transport being set up per Network engineer. Will attempt call back later.
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
## 2020-09-06 ENCOUNTER — Emergency Department
Admission: EM | Admit: 2020-09-06 | Discharge: 2020-09-06 | Disposition: A | Discharge status: Home / Self Care | Payer: Medicare Other | Attending: Emergency Medicine | Admitting: Emergency Medicine

## 2020-09-06 ENCOUNTER — Other Ambulatory Visit: Payer: Self-pay

## 2020-09-06 ENCOUNTER — Emergency Department: Payer: Medicare Other

## 2020-09-06 DIAGNOSIS — F17210 Nicotine dependence, cigarettes, uncomplicated: Secondary | ICD-10-CM | POA: Diagnosis not present

## 2020-09-06 DIAGNOSIS — Z79899 Other long term (current) drug therapy: Secondary | ICD-10-CM | POA: Insufficient documentation

## 2020-09-06 DIAGNOSIS — E1142 Type 2 diabetes mellitus with diabetic polyneuropathy: Secondary | ICD-10-CM | POA: Insufficient documentation

## 2020-09-06 DIAGNOSIS — Z7902 Long term (current) use of antithrombotics/antiplatelets: Secondary | ICD-10-CM | POA: Insufficient documentation

## 2020-09-06 DIAGNOSIS — R569 Unspecified convulsions: Secondary | ICD-10-CM | POA: Diagnosis present

## 2020-09-06 DIAGNOSIS — I10 Essential (primary) hypertension: Secondary | ICD-10-CM | POA: Insufficient documentation

## 2020-09-06 LAB — CBC WITH DIFFERENTIAL/PLATELET
Abs Immature Granulocytes: 0 10*3/uL (ref 0.00–0.07)
Basophils Absolute: 0 10*3/uL (ref 0.0–0.1)
Basophils Relative: 1 %
Eosinophils Absolute: 0.1 10*3/uL (ref 0.0–0.5)
Eosinophils Relative: 3 %
HCT: 37.2 % (ref 36.0–46.0)
Hemoglobin: 12.6 g/dL (ref 12.0–15.0)
Immature Granulocytes: 0 %
Lymphocytes Relative: 40 %
Lymphs Abs: 1.5 10*3/uL (ref 0.7–4.0)
MCH: 34.7 pg — ABNORMAL HIGH (ref 26.0–34.0)
MCHC: 33.9 g/dL (ref 30.0–36.0)
MCV: 102.5 fL — ABNORMAL HIGH (ref 80.0–100.0)
Monocytes Absolute: 0.4 10*3/uL (ref 0.1–1.0)
Monocytes Relative: 11 %
Neutro Abs: 1.7 10*3/uL (ref 1.7–7.7)
Neutrophils Relative %: 45 %
Platelets: 186 10*3/uL (ref 150–400)
RBC: 3.63 MIL/uL — ABNORMAL LOW (ref 3.87–5.11)
RDW: 12 % (ref 11.5–15.5)
WBC: 3.7 10*3/uL — ABNORMAL LOW (ref 4.0–10.5)
nRBC: 0 % (ref 0.0–0.2)

## 2020-09-06 LAB — BASIC METABOLIC PANEL
Anion gap: 6 (ref 5–15)
BUN: 12 mg/dL (ref 8–23)
CO2: 28 mmol/L (ref 22–32)
Calcium: 8.7 mg/dL — ABNORMAL LOW (ref 8.9–10.3)
Chloride: 104 mmol/L (ref 98–111)
Creatinine, Ser: 0.87 mg/dL (ref 0.44–1.00)
GFR, Estimated: >60 mL/min (ref >60)
Glucose, Bld: 84 mg/dL (ref 70–99)
Potassium: 4 mmol/L (ref 3.5–5.1)
Sodium: 138 mmol/L (ref 135–145)

## 2020-09-06 LAB — TROPONIN I (HIGH SENSITIVITY): Troponin I (High Sensitivity): 4 ng/L (ref <18)

## 2020-09-06 MED ORDER — LEVETIRACETAM IN NACL 1500 MG/100ML IV SOLN
1500.0000 mg | Freq: Once | INTRAVENOUS | Status: AC
  Administered 2020-09-06: 1500 mg via INTRAVENOUS
  Filled 2020-09-06: qty 100

## 2020-09-06 MED ORDER — LEVETIRACETAM 500 MG PO TABS: 500.0000 mg | ORAL_TABLET | Freq: Two times a day (BID) | ORAL | 0 refills | Status: DC

## 2020-09-06 MED ORDER — PHENYTOIN SODIUM EXTENDED 100 MG PO CAPS
100.0000 mg | ORAL_CAPSULE | ORAL | Status: AC
  Administered 2020-09-06: 100 mg via ORAL
  Filled 2020-09-06: qty 1

## 2020-09-06 MED ORDER — LAMOTRIGINE 100 MG PO TABS: 150.0000 mg | ORAL_TABLET | Freq: Two times a day (BID) | ORAL | 0 refills | Status: DC

## 2020-09-06 NOTE — ED Provider Notes (Signed)
[facility] Emergency Department Provider Note  ____________________________________________   Event Date/Time   First MD Initiated Contact with Patient 09/06/20 1607     (approximate)  I have reviewed the triage vital signs and the nursing notes.   HISTORY  Chief Complaint Seizures   HPI Marcia Brown is a 65 y.o. 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  Staff reported some generalized shaking all EMS did not see any seizure-like activity on arrival.  Patient does not recall exactly what happened but states she currently does not have any complaints.  She denies any headache, earache, sore throat, vomiting, diarrhea, dysuria, rash, chest pain, cough or other recent sick symptoms.  No recent falls or injuries.  She is not oriented to year but is oriented to month and states that sometimes happens after a seizure.  She has not had any incontinence today.  She thinks has been taking her seizure medications as directed but does not 100% sure.         Past Medical History:  Diagnosis Date  . Anxiety   . Asthma   . Cerebral ischemia   . COPD (chronic obstructive pulmonary disease) ([place])   . Depression   . High cholesterol   . Polyneuropathy   . Seizures ([place])   . Sleep apnea     Patient Active Problem List   Diagnosis Date Noted  . Seizures ([place]) 08/04/2019  . Acute lower UTI 06/26/2019  . Hypokalemia 06/26/2019  . AMS (altered mental status) 06/26/2019  . Tobacco abuse 12/27/2015  . Repeated falls 05/24/2015  . Seizure ([place]) 02/03/2015  . 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 ESOPHAGEAL DILATION    . FOOT SURGERY Right     Prior to  Admission medications   Medication Sig Start Date End Date Taking? Authorizing Provider  lamoTRIgine (LAMICTAL) 100 MG tablet Take 1.5 tablets (150 mg total) by mouth 2 (two) times daily. 09/06/20 12/05/20 Yes Lucrezia Starch, MD  albuterol (PROVENTIL HFA;VENTOLIN HFA) 108 (90 BASE) MCG/ACT inhaler Inhale 2 puffs into the lungs every 4 (four) hours as needed for wheezing or shortness of breath.    [provider]  alum & mag hydroxide-simeth (ANTACID LIQUID) 200-200-20 MG/5ML suspension Take 30 mLs by mouth every 6 (six) hours as needed for indigestion or heartburn.    [provider]  atorvastatin (LIPITOR) 10 MG tablet Take 10 mg by mouth at bedtime.     [provider]  cholecalciferol (VITAMIN D) 1000 units tablet Take 2,000 Units by mouth daily. Give with Vitamin D3 400 units    [provider]  cholecalciferol (VITAMIN D) 400 UNITS TABS tablet Take 400 Units by mouth daily. Give with Vitamin D3 2000 units    [provider]  clonazePAM (KLONOPIN) 0.5 MG tablet Take 0.5 mg by mouth 3 (three) times daily.     [provider]  clopidogrel (PLAVIX) 75 MG tablet Take 75 mg by mouth daily. 06/19/19   [provider]  cyanocobalamin 500 MCG tablet Take 500 mcg by mouth daily.    [provider]  diclofenac sodium (VOLTAREN) 1 % GEL Apply 4 g topically 3 (three) times daily. Apply to both knees  [provider]  diphenhydrAMINE (BENADRYL) 25 MG tablet Take 25 mg by mouth every 6 (six) hours as needed for allergies.    [provider]  DULoxetine (CYMBALTA) 60 MG capsule Take 60 mg by mouth daily.    [provider]  ferrous sulfate 325 (65 FE) MG tablet Take 325 mg by mouth 3 (three) times daily with meals.    [provider]  gabapentin (NEURONTIN) 100 MG capsule Take 200 mg by mouth 2 (two) times daily.    [provider]  levETIRAcetam (KEPPRA) 500 MG tablet Take 1 tablet (500 mg  total) by mouth 2 (two) times daily. Take one tablet (500) by mouth daily in the morning and one and one-half tablets (750 mg) in the evening 09/06/20 12/05/20  Lucrezia Starch, MD  loperamide (IMODIUM) 2 MG capsule Take 4 mg by mouth as needed for diarrhea or loose stools.    [provider]  magnesium hydroxide (MILK OF MAGNESIA) 400 MG/5ML suspension Take 30 mLs by mouth daily as needed for mild constipation or moderate constipation.    [provider]  mometasone-formoterol (DULERA) 100-5 MCG/ACT AERO Inhale 2 puffs into the lungs 2 (two) times daily.    [provider]  pantoprazole (PROTONIX) 40 MG tablet Take 40 mg by mouth daily before breakfast.     [provider]  phenytoin (DILANTIN) 100 MG ER capsule Take 100 mg by mouth 2 (two) times daily.     [provider]  potassium chloride SA (KLOR-CON M20) 20 MEQ tablet Take 1 tablet (20 mEq total) by mouth daily. 11/28/19   Hinda Kehr, MD  QUEtiapine (SEROQUEL) 25 MG tablet Take 25 mg by mouth at bedtime. 06/19/19   [provider]  sucralfate (CARAFATE) 1 g tablet Take 1 g by mouth 4 (four) times daily. 06/19/19   [provider]  VIMPAT 150 MG TABS Take 1 tablet by mouth 2 (two) times daily. 06/03/19   [provider]    Allergies Ciprofloxacin and Aspirin  Family History  Problem Relation Age of Onset  . Breast cancer Daughter 7    Social History Social History   Tobacco Use  . Smoking status: Current Every Day Smoker    Packs/day: 0.50    Types: Cigarettes  . Smokeless tobacco: Never Used  Substance Use Topics  . Alcohol use: No  . Drug use: No    Review of Systems  Review of Systems  Constitutional: Negative for chills and fever.  HENT: Negative for sore throat.   Eyes: Negative for pain.  Respiratory: Negative for cough and stridor.   Cardiovascular: Negative for chest pain.  Gastrointestinal: Negative for vomiting.  Genitourinary: Negative for  dysuria.  Musculoskeletal: Negative for myalgias.  Skin: Negative for rash.  Neurological: Positive for focal weakness ( R arm and R leg compared to L ) and seizures. Negative for loss of consciousness and headaches.  Psychiatric/Behavioral: Negative for suicidal ideas.  All other systems reviewed and are negative.     ____________________________________________   PHYSICAL EXAM:  VITAL SIGNS: ED Triage Vitals  Enc Vitals Group     BP      Pulse      Resp      Temp      Temp src      SpO2      Weight      Height      Head Circumference      Peak Flow  Pain Score      Pain Loc      Pain Edu?      Excl. in [place]?    Vitals:   09/06/20 1608 09/06/20 1645  BP: (!) 151/72 (!) 151/72  Pulse: 82 60  Resp: 15 19  Temp: 98.7 F (37.1 C)   SpO2: 96% 96%   Physical Exam Vitals and nursing note reviewed.  Constitutional:      General: She is not in acute distress.    Appearance: She is well-developed.  HENT:     Head: Normocephalic and atraumatic.     Right Ear: External ear normal.     Left Ear: External ear normal.     Nose: Nose normal.  Eyes:     Conjunctiva/sclera: Conjunctivae normal.  Cardiovascular:     Rate and Rhythm: Normal rate and regular rhythm.     Heart sounds: No murmur heard.   Pulmonary:     Effort: Pulmonary effort is normal. No respiratory distress.     Breath sounds: Normal breath sounds.  Abdominal:     Palpations: Abdomen is soft.     Tenderness: There is no abdominal tenderness.  Musculoskeletal:     Cervical back: Neck supple.  Skin:    General: Skin is warm and dry.  Neurological:     Mental Status: She is alert.     Cranial Nerves: Facial asymmetry present.     Patient has slightly decreased grip strength of the right hand and flexion extension right hip which she says is baseline.  ____________________________________________   LABS (all labs ordered are listed, but only abnormal results are displayed)  Labs Reviewed   CBC WITH DIFFERENTIAL/PLATELET - Abnormal; Notable for the following components:      Result Value   WBC 3.7 (*)    RBC 3.63 (*)    MCV 102.5 (*)    MCH 34.7 (*)    All other components within normal limits  BASIC METABOLIC PANEL - Abnormal; Notable for the following components:   Calcium 8.7 (*)    All other components within normal limits  PHENYTOIN LEVEL, FREE AND TOTAL  LEVETIRACETAM LEVEL  LAMOTRIGINE LEVEL  TROPONIN I (HIGH SENSITIVITY)  TROPONIN I (HIGH SENSITIVITY)   ____________________________________________  EKG  Sinus rhythm with a ventricular rate of 62, normal axis, unremarkable intervals and no clear evidence of acute ischemia or significant underlying arrhythmia.  ____________________________________________  RADIOLOGY  ED MD interpretation: CT head is unremarkable for acute intracranial process.  Chest x-ray is unremarkable.  Official radiology report(s): DG Chest 2 View  Result Date: 09/06/2020 CLINICAL DATA:  Syncope versus seizure EXAM: CHEST - 2 VIEW COMPARISON:  02/21/2020 FINDINGS: Moderate hiatal hernia. No focal opacity or pleural effusion. Stable cardiomediastinal silhouette with aortic atherosclerosis. No pneumothorax. IMPRESSION: No active cardiopulmonary disease. Moderate hiatal hernia. Electronically Signed   By: Donavan Foil M.D.   On: 09/06/2020 16:58   CT Head Wo Contrast  Result Date: 09/06/2020 CLINICAL DATA:  Seizure EXAM: CT HEAD WITHOUT CONTRAST TECHNIQUE: Contiguous axial images were obtained from the base of the skull through the vertex without intravenous contrast. COMPARISON:  CT brain 02/21/2019 FINDINGS: Brain: No acute territorial infarction, hemorrhage or intracranial mass. Encephalomalacia in the right temporal lobe as before. Mild atrophy and chronic small vessel ischemic change of the white matter. Stable ventricle size. Vascular: No hyperdense vessels.  Carotid vascular calcification Skull: Evidence of prior right temporal  craniotomy. Chronic appearing fracture deformities of the lateral right orbit and right  zygomatic arch. Sinuses/Orbits: No acute finding. Other: None IMPRESSION: 1. No CT evidence for acute intracranial abnormality. 2. Atrophy and chronic small vessel ischemic changes of the white matter. Encephalomalacia in the right temporal lobe. Electronically Signed   By: Donavan Foil M.D.   On: 09/06/2020 16:38    ____________________________________________   PROCEDURES  Procedure(s) performed (including Critical Care):  .1-3 Lead EKG Interpretation Performed by: Lucrezia Starch, MD Authorized by: Lucrezia Starch, MD     Interpretation: normal     ECG rate assessment: normal     Rhythm: sinus rhythm     Ectopy: none     Conduction: normal       ____________________________________________   INITIAL IMPRESSION / ASSESSMENT AND PLAN / ED COURSE      Patient presents with above-stated reexam after staff at her facility was concerned about possible seizure.  Patient does not recall this and is unable to write any additional history other than stating she has no current symptoms and otherwise been in her usual state of health.  On arrival she is afebrile and hemodynamically stable.  She has no obvious new objective focal deficits as she has some chronic deficits noted above from prior strokes.  Although certainly possible she had a syncopal episodes does seem more likely she had a brief seizure despite absence of tongue biting or incontinence.  EKG has no significant arrhythmia and troponin is not consistent with ACS or myocarditis.  Chest x-ray shows no acute thoracic abnormality.  CT head shows no evidence of SAH and given absence of focal deficits on exam that are new from prior suspicion for acute CVA.  No history or exam findings to suggest acute trauma or other acute infectious process.  CBC shows no acute anemia and WBC count of 3.7.  BMP shows no significant electrolyte or metabolic  derangements.  I did review patient's MAR sent from her facility and it seems she is currently on Vimpat and phenytoin.  However on review of her most recent neurology visit on 3/23 she is also supposed to be on Dilantin and Keppra which seems she has not been getting.  She was given a dose of Dilantin and Keppra in the emergency room.  Advised patient that she is supposed to be in all 4 views on review of Dr. Lannie Fields notes from the 3/23 visit.  Emphasized this and a written discharge instructions and Rx filled for both of these medications despite them being apparently sent last week.  Given patient appears at her baseline with stable vitals no other reassuring exam work-up I believe she is safe for discharge with plan for outpatient neurology follow-up.  Discharged back to her nursing facility.    ____________________________________________   FINAL CLINICAL IMPRESSION(S) / ED DIAGNOSES  Final diagnoses:  Seizure ([place])    Medications  levETIRAcetam (KEPPRA) IVPB 1500 mg/ 100 mL premix (0 mg Intravenous Stopped 09/06/20 1707)  phenytoin (DILANTIN) ER capsule 100 mg (100 mg Oral Given 09/06/20 1656)     ED Discharge Orders         Ordered    levETIRAcetam (KEPPRA) 500 MG tablet  2 times daily        09/06/20 1754    lamoTRIgine (LAMICTAL) 100 MG tablet  2 times daily        09/06/20 1754           Note:  This document was prepared using Dragon voice recognition software and may include unintentional dictation errors.  Lucrezia Starch, MD 09/06/20 [phone]

## 2020-09-06 NOTE — Discharge Instructions (Addendum)
As prescribed by Dr. Melrose Nakayama Pt's neurologist on 3/23 patient should be on the following AED regiment: - Lamictal to 150 mg twice daily, everyday.   - vimpat 150 mg twice dail  - Keppra 500 mg in the morning and 750 mg at night.   -  Dilantin 100 mg twice daily.

## 2020-09-06 NOTE — ED Notes (Signed)
[facility]  EMS  PER  [facility]  RN

## 2020-09-06 NOTE — ED Triage Notes (Signed)
Pt presents to the [facility] via EMS from the [facility] at [place] with c/o possible seizure like activity. EMS states that staff noticed seizure like activity and called EMS. Pt reports hx of seizures but denies having a seizure today. Pt currently A&Ox4

## 2020-09-06 NOTE — ED Notes (Signed)
[facility]  CALLED  FOR  TRANSPORT  TO  THE  [facility]

## 2020-11-03 ENCOUNTER — Other Ambulatory Visit: Payer: Self-pay

## 2020-11-03 ENCOUNTER — Encounter: Payer: Self-pay | Admitting: *Deleted

## 2020-11-03 DIAGNOSIS — E876 Hypokalemia: Secondary | ICD-10-CM | POA: Diagnosis not present

## 2020-11-03 DIAGNOSIS — R4182 Altered mental status, unspecified: Secondary | ICD-10-CM | POA: Diagnosis present

## 2020-11-03 DIAGNOSIS — Z20822 Contact with and (suspected) exposure to covid-19: Secondary | ICD-10-CM | POA: Insufficient documentation

## 2020-11-03 DIAGNOSIS — D72829 Elevated white blood cell count, unspecified: Secondary | ICD-10-CM | POA: Insufficient documentation

## 2020-11-03 DIAGNOSIS — J45909 Unspecified asthma, uncomplicated: Secondary | ICD-10-CM | POA: Diagnosis not present

## 2020-11-03 DIAGNOSIS — F17210 Nicotine dependence, cigarettes, uncomplicated: Secondary | ICD-10-CM | POA: Insufficient documentation

## 2020-11-03 DIAGNOSIS — J449 Chronic obstructive pulmonary disease, unspecified: Secondary | ICD-10-CM | POA: Diagnosis not present

## 2020-11-03 DIAGNOSIS — R410 Disorientation, unspecified: Secondary | ICD-10-CM | POA: Diagnosis not present

## 2020-11-03 DIAGNOSIS — Z7951 Long term (current) use of inhaled steroids: Secondary | ICD-10-CM | POA: Insufficient documentation

## 2020-11-03 DIAGNOSIS — N390 Urinary tract infection, site not specified: Secondary | ICD-10-CM | POA: Insufficient documentation

## 2020-11-03 LAB — CBC
HCT: 33.6 % — ABNORMAL LOW (ref 36.0–46.0)
Hemoglobin: 11.6 g/dL — ABNORMAL LOW (ref 12.0–15.0)
MCH: 35.3 pg — ABNORMAL HIGH (ref 26.0–34.0)
MCHC: 34.5 g/dL (ref 30.0–36.0)
MCV: 102.1 fL — ABNORMAL HIGH (ref 80.0–100.0)
Platelets: 214 10*3/uL (ref 150–400)
RBC: 3.29 MIL/uL — ABNORMAL LOW (ref 3.87–5.11)
RDW: 12.9 % (ref 11.5–15.5)
WBC: 4.9 10*3/uL (ref 4.0–10.5)
nRBC: 0 % (ref 0.0–0.2)

## 2020-11-03 LAB — BASIC METABOLIC PANEL
Anion gap: 10 (ref 5–15)
BUN: 13 mg/dL (ref 8–23)
CO2: 24 mmol/L (ref 22–32)
Calcium: 8.7 mg/dL — ABNORMAL LOW (ref 8.9–10.3)
Chloride: 100 mmol/L (ref 98–111)
Creatinine, Ser: 0.87 mg/dL (ref 0.44–1.00)
GFR, Estimated: >60 mL/min (ref >60)
Glucose, Bld: 104 mg/dL — ABNORMAL HIGH (ref 70–99)
Potassium: 3.1 mmol/L — ABNORMAL LOW (ref 3.5–5.1)
Sodium: 134 mmol/L — ABNORMAL LOW (ref 135–145)

## 2020-11-03 NOTE — ED Triage Notes (Signed)
EMS brings pt in from the [facility] for reports of increased confusion by staff, hx seizures & UTI, currently on antibiotics

## 2020-11-03 NOTE — ED Triage Notes (Signed)
Pt brought in via ems from the [facility]. Pt has increased confusion.  Pt has lower back pain, hx uti.  Pt on abx per ems.  Pt alert

## 2020-11-04 ENCOUNTER — Emergency Department
Admission: EM | Admit: 2020-11-04 | Discharge: 2020-11-04 | Disposition: A | Discharge status: Home / Self Care | Payer: Medicare Other | Attending: Emergency Medicine | Admitting: Emergency Medicine

## 2020-11-04 DIAGNOSIS — N390 Urinary tract infection, site not specified: Secondary | ICD-10-CM | POA: Diagnosis not present

## 2020-11-04 DIAGNOSIS — E876 Hypokalemia: Secondary | ICD-10-CM

## 2020-11-04 LAB — URINALYSIS, COMPLETE (UACMP) WITH MICROSCOPIC
Bilirubin Urine: NEGATIVE
Glucose, UA: NEGATIVE mg/dL
Ketones, ur: NEGATIVE mg/dL
Nitrite: POSITIVE — AB
Protein, ur: NEGATIVE mg/dL
Specific Gravity, Urine: 1.004 — ABNORMAL LOW (ref 1.005–1.030)
pH: 7 (ref 5.0–8.0)

## 2020-11-04 LAB — RESP PANEL BY RT-PCR (FLU A&B, COVID) ARPGX2
Influenza A by PCR: NEGATIVE
Influenza B by PCR: NEGATIVE
SARS Coronavirus 2 by RT PCR: NEGATIVE

## 2020-11-04 LAB — LACTIC ACID, PLASMA: Lactic Acid, Venous: 1 mmol/L (ref 0.5–1.9)

## 2020-11-04 LAB — PHENYTOIN LEVEL, TOTAL: Phenytoin Lvl: 13.3 ug/mL (ref 10.0–20.0)

## 2020-11-04 LAB — PROCALCITONIN: Procalcitonin: <0.1 ng/mL

## 2020-11-04 MED ORDER — CEPHALEXIN 500 MG PO CAPS: 500.0000 mg | ORAL_CAPSULE | Freq: Three times a day (TID) | ORAL | 0 refills | Status: DC

## 2020-11-04 MED ORDER — POTASSIUM CHLORIDE CRYS ER 20 MEQ PO TBCR
40.0000 meq | EXTENDED_RELEASE_TABLET | Freq: Once | ORAL | Status: AC
  Administered 2020-11-04: 40 meq via ORAL
  Filled 2020-11-04: qty 2

## 2020-11-04 MED ORDER — SODIUM CHLORIDE 0.9 % IV SOLN
1.0000 g | Freq: Once | INTRAVENOUS | Status: AC
  Administered 2020-11-04: 1 g via INTRAVENOUS
  Filled 2020-11-04: qty 10

## 2020-11-04 NOTE — ED Notes (Signed)
Called to check if patient was on list for pick up and [facility] stated was on list [phone]

## 2020-11-04 NOTE — ED Notes (Signed)
Report given to [facility]

## 2020-11-04 NOTE — ED Notes (Addendum)
Patient reports "I don't know why they sent me here." Patient does endorse urinary frequency, and dysuria x unknown amount of time. Patient reports being on "pills of some kind" at the [facility]. Patient is alert, oriented x2 (disoriented to place and situation).

## 2020-11-04 NOTE — ED Notes (Signed)
Phenytoin Level drawn and sent to lab

## 2020-11-04 NOTE — ED Provider Notes (Signed)
[facility] Emergency Department Provider Note   ____________________________________________   Event Date/Time   First MD Initiated Contact with Patient 11/04/20 0209     (approximate)  I have reviewed the triage vital signs and the nursing notes.   HISTORY  Chief Complaint Altered Mental Status    HPI Marcia Brown is a 65 y.o. female sent to the ED via EMS from the [facility] for increased confusion.  Reportedly patient is on antibiotics for UTI but patient states she does not think they have started them yet.  Staff sent patient for evaluation of increased confusion.  Patient states she is always confused and does not feel she is more confused than usual.  Endorses urinary frequency and dysuria.  Denies fever, cough, chest pain, shortness of breath, abdominal/flank pain, nausea, vomiting, diarrhea or dizziness.     Past Medical History:  Diagnosis Date  . Anxiety   . Asthma   . Cerebral ischemia   . COPD (chronic obstructive pulmonary disease) ([place])   . Depression   . High cholesterol   . Polyneuropathy   . Seizures ([place])   . Sleep apnea     Patient Active Problem List   Diagnosis Date Noted  . Seizures ([place]) 08/04/2019  . Acute lower UTI 06/26/2019  . Hypokalemia 06/26/2019  . AMS (altered mental status) 06/26/2019  . Tobacco abuse 12/27/2015  . Repeated falls 05/24/2015  . Seizure ([place]) 02/03/2015  . 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 ESOPHAGEAL DILATION    . FOOT SURGERY Right     Prior to Admission medications   Medication Sig Start Date End Date Taking? Authorizing Provider  cephALEXin (KEFLEX) 500 MG capsule Take 1 capsule (500 mg total) by mouth 3 (three) times daily. 11/04/20  Yes Paulette Blanch, MD  albuterol (PROVENTIL HFA;VENTOLIN HFA) 108 (90 BASE) MCG/ACT inhaler Inhale 2 puffs into the lungs every 4 (four) hours as needed for wheezing or shortness of breath.    [provider]  alum & mag hydroxide-simeth (ANTACID LIQUID) 200-200-20 MG/5ML suspension Take 30 mLs by mouth every 6 (six) hours as needed for indigestion or heartburn.    [provider]  atorvastatin (LIPITOR) 10 MG tablet Take 10 mg by mouth at bedtime.     [provider]  cholecalciferol (VITAMIN D) 1000 units tablet Take 2,000 Units by mouth daily. Give with Vitamin D3 400 units    [provider]  cholecalciferol (VITAMIN D) 400 UNITS TABS tablet Take 400 Units by mouth daily. Give with Vitamin D3 2000 units    [provider]  clonazePAM (KLONOPIN) 0.5 MG tablet Take 0.5 mg by mouth 3 (three) times daily.     [provider]  clopidogrel (PLAVIX) 75 MG tablet Take 75 mg by mouth daily. 06/19/19   [provider]  cyanocobalamin 500 MCG tablet Take 500 mcg by mouth daily.    [provider]  diclofenac sodium (VOLTAREN) 1 % GEL Apply 4 g topically 3 (three) times daily. Apply to both knees    [provider]  diphenhydrAMINE (BENADRYL) 25 MG tablet Take 25 mg by mouth every 6 (six) hours as needed for allergies.    [provider]  DULoxetine (CYMBALTA) 60 MG capsule Take 60 mg by mouth daily.    [provider]  ferrous sulfate 325 (65 FE) MG tablet Take 325 mg by mouth 3 (three) times daily with meals.  [provider]  gabapentin (NEURONTIN) 100 MG capsule Take 200 mg by mouth 2 (two) times daily.    [provider]  lamoTRIgine (LAMICTAL) 100 MG tablet Take 1.5 tablets (150 mg total) by mouth 2 (two) times daily. 09/06/20 12/05/20  Lucrezia Starch, MD  levETIRAcetam (KEPPRA) 500 MG tablet Take 1 tablet (500 mg total) by mouth 2 (two) times daily. Take one tablet (500) by mouth daily in the morning and one and one-half tablets (750 mg) in the evening 09/06/20 12/05/20  Lucrezia Starch, MD  loperamide (IMODIUM) 2 MG capsule Take 4 mg by mouth as needed for diarrhea or loose stools.     [provider]  magnesium hydroxide (MILK OF MAGNESIA) 400 MG/5ML suspension Take 30 mLs by mouth daily as needed for mild constipation or moderate constipation.    [provider]  mometasone-formoterol (DULERA) 100-5 MCG/ACT AERO Inhale 2 puffs into the lungs 2 (two) times daily.    [provider]  pantoprazole (PROTONIX) 40 MG tablet Take 40 mg by mouth daily before breakfast.     [provider]  phenytoin (DILANTIN) 100 MG ER capsule Take 100 mg by mouth 2 (two) times daily.     [provider]  potassium chloride SA (KLOR-CON M20) 20 MEQ tablet Take 1 tablet (20 mEq total) by mouth daily. 11/28/19   Hinda Kehr, MD  QUEtiapine (SEROQUEL) 25 MG tablet Take 25 mg by mouth at bedtime. 06/19/19   [provider]  sucralfate (CARAFATE) 1 g tablet Take 1 g by mouth 4 (four) times daily. 06/19/19   [provider]  VIMPAT 150 MG TABS Take 1 tablet by mouth 2 (two) times daily. 06/03/19   [provider]    Allergies Ciprofloxacin and Aspirin  Family History  Problem Relation Age of Onset  . Breast cancer Daughter 51    Social History Social History   Tobacco Use  . Smoking status: Current Every Day Smoker    Packs/day: 0.50    Types: Cigarettes  . Smokeless tobacco: Never Used  Substance Use Topics  . Alcohol use: No  . Drug use: No    Review of Systems  Constitutional: No fever/chills Eyes: No visual changes. ENT: No sore throat. Cardiovascular: Denies chest pain. Respiratory: Denies shortness of breath. Gastrointestinal: No abdominal pain.  No nausea, no vomiting.  No diarrhea.  No constipation. Genitourinary: Positive for dysuria. Musculoskeletal: Negative for back pain. Skin: Negative for rash. Neurological: Positive for confusion.  Negative for headaches, focal weakness or numbness.   ____________________________________________   PHYSICAL EXAM:  VITAL SIGNS: ED Triage Vitals  Enc Vitals  Group     BP 11/03/20 2114 139/79     Pulse Rate 11/03/20 2114 60     Resp 11/03/20 2114 20     Temp 11/03/20 2114 98.3 F (36.8 C)     Temp Source 11/03/20 2114 Oral     SpO2 11/03/20 2104 98 %     Weight 11/03/20 2114 174 lb (78.9 kg)     Height 11/03/20 2114 5\' 6"  (1.676 m)     Head Circumference --      Peak Flow --      Pain Score 11/03/20 2114 0     Pain Loc --      Pain Edu? --      Excl. in [place]? --     Constitutional: Alert and oriented. Well appearing and in no acute distress. Eyes: Conjunctivae are normal. PERRL. EOMI.  Head: Atraumatic. Nose: No congestion/rhinnorhea. Mouth/Throat: Mucous membranes are moist.   Neck: No stridor.   Cardiovascular: Normal rate, regular rhythm. Grossly normal heart sounds.  Good peripheral circulation. Respiratory: Normal respiratory effort.  No retractions. Lungs CTAB. Gastrointestinal: Soft and nontender to light or deep palpation. No distention. No abdominal bruits. No CVA tenderness. Musculoskeletal: No lower extremity tenderness nor edema.  No joint effusions. Neurologic: Alert and oriented to person and time.  Normal speech and language.  Baseline facial droop per patient.  No gross focal neurologic deficits are appreciated.  Skin:  Skin is warm, dry and intact. No rash noted.  No petechiae. Psychiatric: Mood and affect are normal. Speech and behavior are normal.  ____________________________________________   LABS (all labs ordered are listed, but only abnormal results are displayed)  Labs Reviewed  BASIC METABOLIC PANEL - Abnormal; Notable for the following components:      Result Value   Sodium 134 (*)    Potassium 3.1 (*)    Glucose, Bld 104 (*)    Calcium 8.7 (*)    All other components within normal limits  CBC - Abnormal; Notable for the following components:   RBC 3.29 (*)    Hemoglobin 11.6 (*)    HCT 33.6 (*)    MCV 102.1 (*)    MCH 35.3 (*)    All other components within normal limits  URINALYSIS, COMPLETE  (UACMP) WITH MICROSCOPIC - Abnormal; Notable for the following components:   Color, Urine YELLOW (*)    APPearance CLEAR (*)    Specific Gravity, Urine 1.004 (*)    Hgb urine dipstick MODERATE (*)    Nitrite POSITIVE (*)    Leukocytes,Ua MODERATE (*)    Bacteria, UA MANY (*)    All other components within normal limits  RESP PANEL BY RT-PCR (FLU A&B, COVID) ARPGX2  CULTURE, BLOOD (ROUTINE X 2)  CULTURE, BLOOD (ROUTINE X 2)  URINE CULTURE  LACTIC ACID, PLASMA  PROCALCITONIN  PHENYTOIN LEVEL, TOTAL   ____________________________________________  EKG  None ____________________________________________  RADIOLOGY I, Lamoine Magallon J, personally viewed and evaluated these images (plain radiographs) as part of my medical decision making, as well as reviewing the written report by the radiologist.  ED MD interpretation: None  Official radiology report(s): No results found.  ____________________________________________   PROCEDURES  Procedure(s) performed (including Critical Care):  Procedures   ____________________________________________   INITIAL IMPRESSION / ASSESSMENT AND PLAN / ED COURSE  As part of my medical decision making, I reviewed the following data within the [facility] notes reviewed and incorporated, Labs reviewed, Old chart reviewed and Notes from prior ED visits     65 year old female sent from SNF for evaluation of confusion. Differential diagnosis includes, but is not limited to, alcohol, illicit or prescription medications, or other toxic ingestion; intracranial pathology such as stroke or intracerebral hemorrhage; fever or infectious causes including sepsis; hypoxemia and/or hypercarbia; uremia; trauma; endocrine related disorders such as diabetes, hypoglycemia, and thyroid-related diseases; hypertensive encephalopathy; etc.  Laboratory results demonstrate normal WBC, mild hypokalemia, nitrite and leukocyte positive UTI.  Patient  does not appear extremely confused.  Will check blood cultures, lactic acid, procalcitonin, phenytoin level, respiratory panel.  I personally reviewed patient's MAR and do not see that she has been started on antibiotic for UTI.  Will administer IV Rocephin.  Clinical Course as of 11/04/20 1610  Fri Nov 04, 2020  9604 Work-up negative thus far.  Awaiting phenytoin level.  Patient resting in no acute  distress [JS]  0535 Phenytoin level unremarkable.  Patient resting comfortably no acute distress.  No evidence of sepsis or severely altered mentation.  Will discharge back to facility on Keflex.  Strict return precautions given.  Patient verbalizes understanding agrees with plan of care. [JS]    Clinical Course User Index [JS] Paulette Blanch, MD     ____________________________________________   FINAL CLINICAL IMPRESSION(S) / ED DIAGNOSES  Final diagnoses:  Lower urinary tract infectious disease  Hypokalemia     ED Discharge Orders         Ordered    cephALEXin (KEFLEX) 500 MG capsule  3 times daily        11/04/20 0536          *Please note:  Marcia Brown was evaluated in Emergency Department on 11/04/2020 for the symptoms described in the history of present illness. She was evaluated in the context of the global COVID-19 pandemic, which necessitated consideration that the patient might be at risk for infection with the SARS-CoV-2 virus that causes COVID-19. Institutional protocols and algorithms that pertain to the evaluation of patients at risk for COVID-19 are in a state of rapid change based on information released by regulatory bodies including the CDC and federal and state organizations. These policies and algorithms were followed during the patient's care in the ED.  Some ED evaluations and interventions may be delayed as a result of limited staffing during and the pandemic.*   Note:  This document was prepared using Dragon voice recognition software and may include unintentional  dictation errors.   Paulette Blanch, MD 11/04/20 [phone]

## 2020-11-04 NOTE — ED Notes (Signed)
Patient assisted to restroom, steady gait noted, provided with coffee per request.

## 2020-11-04 NOTE — Discharge Instructions (Signed)
Take antibiotic as prescribed (Keflex 500 mg 3 times daily x7 days). Return to the ER for worsening symptoms, persistent vomiting, difficulty breathing, fever or other concerns.

## 2020-11-06 LAB — URINE CULTURE: Culture: >=100000 — AB

## 2020-11-07 NOTE — Progress Notes (Signed)
ED Antimicrobial Stewardship Positive Culture Follow Up   Marcia Brown is an 65 y.o. female who presented to [facility] on 11/04/2020 with a chief complaint of AMS. Pt was on antibiotics for UTI but had not yet started them. Pt reported urinary frequency and dysuria in the ED. Pt resides at [place] called the facility and let them know I called in the prescription to [facility] in [place], [place] which she reported is the pharmacy they pick up the medications from. I also informed the nurse to discontinue the cephalexin.   Chief Complaint  Patient presents with  . Altered Mental Status    Recent Results (from the past 720 hour(s))  Culture, blood (routine x 2)     Status: None (Preliminary result)   Collection Time: 11/04/20  2:11 AM   Specimen: BLOOD  Result Value Ref Range Status   Specimen Description BLOOD RIGHT HAND  Final   Special Requests   Final    BOTTLES DRAWN AEROBIC AND ANAEROBIC Blood Culture results may not be optimal due to an inadequate volume of blood received in culture bottles   Culture   Final    NO GROWTH 3 DAYS Performed at [facility], [address] [place], [place] [address]    Report Status PENDING  Incomplete  Urine culture     Status: Abnormal   Collection Time: 11/04/20  2:11 AM   Specimen: Urine, Random  Result Value Ref Range Status   Specimen Description   Final    URINE, RANDOM Performed at [facility], [address], [place], [place] [address]    Special Requests   Final    NONE Performed at [facility], [address] [place], [place] [address]    Culture (A)  Final    >=100,000 COLONIES/mL ESCHERICHIA COLI Confirmed Extended Spectrum Beta-Lactamase Producer (ESBL).  In bloodstream infections from ESBL organisms, carbapenems are preferred over piperacillin/tazobactam. They are shown to have a lower risk of mortality.    Report Status 11/06/2020 FINAL  Final   Organism ID, Bacteria ESCHERICHIA  COLI (A)  Final      Susceptibility   Escherichia coli - MIC*    AMPICILLIN >=32 RESISTANT Resistant     CEFAZOLIN >=64 RESISTANT Resistant     CEFEPIME 16 RESISTANT Resistant     CEFTRIAXONE >=64 RESISTANT Resistant     CIPROFLOXACIN >=4 RESISTANT Resistant     GENTAMICIN <=1 SENSITIVE Sensitive     IMIPENEM <=0.25 SENSITIVE Sensitive     NITROFURANTOIN <=16 SENSITIVE Sensitive     TRIMETH/SULFA >=320 RESISTANT Resistant     AMPICILLIN/SULBACTAM >=32 RESISTANT Resistant     PIP/TAZO 16 SENSITIVE Sensitive     * >=100,000 COLONIES/mL ESCHERICHIA COLI  Culture, blood (routine x 2)     Status: None (Preliminary result)   Collection Time: 11/04/20  2:12 AM   Specimen: BLOOD  Result Value Ref Range Status   Specimen Description BLOOD LEFT ANTECUBITAL  Final   Special Requests   Final    BOTTLES DRAWN AEROBIC AND ANAEROBIC Blood Culture results may not be optimal due to an inadequate volume of blood received in culture bottles   Culture   Final    NO GROWTH 3 DAYS Performed at [facility], [address] [place], [place] [address]    Report Status PENDING  Incomplete  Resp Panel by RT-PCR (Flu A&B, Covid) Nasopharyngeal Swab     Status: None  Collection Time: 11/04/20  2:13 AM   Specimen: Nasopharyngeal Swab; Nasopharyngeal(NP) swabs in vial transport medium  Result Value Ref Range Status   SARS Coronavirus 2 by RT PCR NEGATIVE NEGATIVE Final    Comment: (NOTE) SARS-CoV-2 target nucleic acids are NOT DETECTED.  The SARS-CoV-2 RNA is generally detectable in upper respiratory specimens during the acute phase of infection. The lowest concentration of SARS-CoV-2 viral copies this assay can detect is 138 copies/mL. A negative result does not preclude SARS-Cov-2 infection and should not be used as the sole basis for treatment or other patient management decisions. A negative result may occur with  improper specimen collection/handling, submission of specimen other than  nasopharyngeal swab, presence of viral mutation(s) within the areas targeted by this assay, and inadequate number of viral copies(<138 copies/mL). A negative result must be combined with clinical observations, patient history, and epidemiological information. The expected result is Negative.  Fact Sheet for Patients:  EntrepreneurPulse.com.au  Fact Sheet for Healthcare Providers:  IncredibleEmployment.be  This test is no t yet approved or cleared by the [place] FDA and  has been authorized for detection and/or diagnosis of SARS-CoV-2 by FDA under an Emergency Use Authorization (EUA). This EUA will remain  in effect (meaning this test can be used) for the duration of the COVID-19 declaration under Section 564(b)(1) of the Act, 21 U.S.C.section 360bbb-3(b)(1), unless the authorization is terminated  or revoked sooner.       Influenza A by PCR NEGATIVE NEGATIVE Final   Influenza B by PCR NEGATIVE NEGATIVE Final    Comment: (NOTE) The Xpert Xpress SARS-CoV-2/FLU/RSV plus assay is intended as an aid in the diagnosis of influenza from Nasopharyngeal swab specimens and should not be used as a sole basis for treatment. Nasal washings and aspirates are unacceptable for Xpert Xpress SARS-CoV-2/FLU/RSV testing.  Fact Sheet for Patients: EntrepreneurPulse.com.au  Fact Sheet for Healthcare Providers: IncredibleEmployment.be  This test is not yet approved or cleared by the [place] FDA and has been authorized for detection and/or diagnosis of SARS-CoV-2 by FDA under an Emergency Use Authorization (EUA). This EUA will remain in effect (meaning this test can be used) for the duration of the COVID-19 declaration under Section 564(b)(1) of the Act, 21 U.S.C. section 360bbb-3(b)(1), unless the authorization is terminated or revoked.  Performed at [facility], [address] [place], [place]  [address]     [x]  Treated with cephalexin, organism resistant to prescribed antimicrobial  New antibiotic prescription: Macrobid 100 mg BID x 5 days  ED Provider: Dr. Hosie Spangle, PharmD Pharmacy Resident  11/07/2020 11:51 AM

## 2020-11-09 LAB — CULTURE, BLOOD (ROUTINE X 2)
Culture: NO GROWTH
Culture: NO GROWTH

## 2021-01-10 IMAGING — CT CT HEAD W/O CM
3 series · 15 of 47 positions shown, 18 images · non-contrast
Comparison: CT brain 08/04/2019

CLINICAL DATA: Altered mental status

EXAM:
CT HEAD WITHOUT CONTRAST
TECHNIQUE: Contiguous axial images were obtained from the base of the skull
through the vertex without intravenous contrast.

[Series 3: head wo · axial · 0.41mm/px · z∈[-138,-13]mm · 9 of 30 slices shown, 12 images]
[im 3/30  brain]
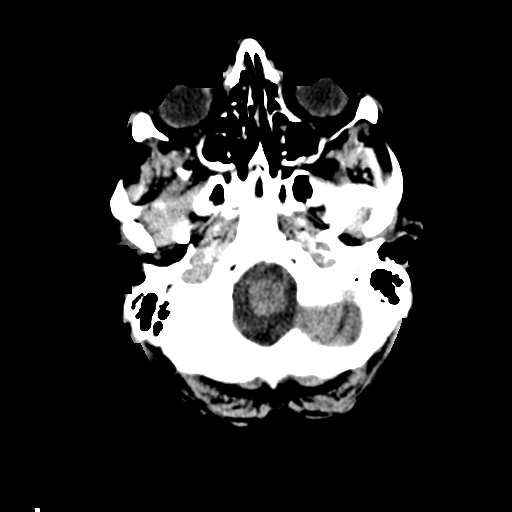
[im 3/30  bone]
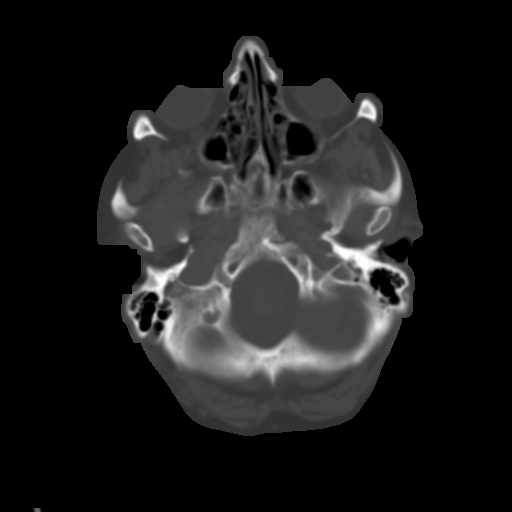
[im 6/30  brain]
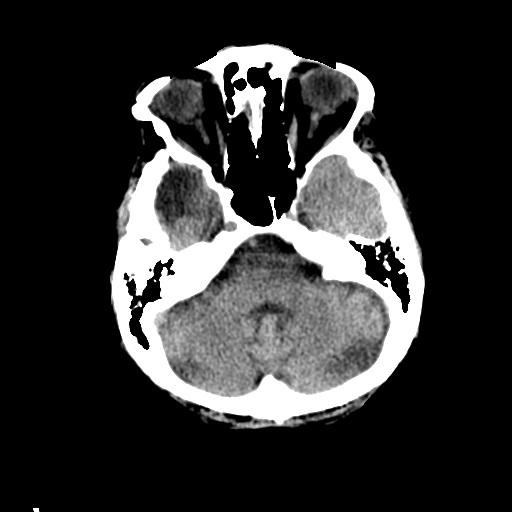
[im 9/30  brain]
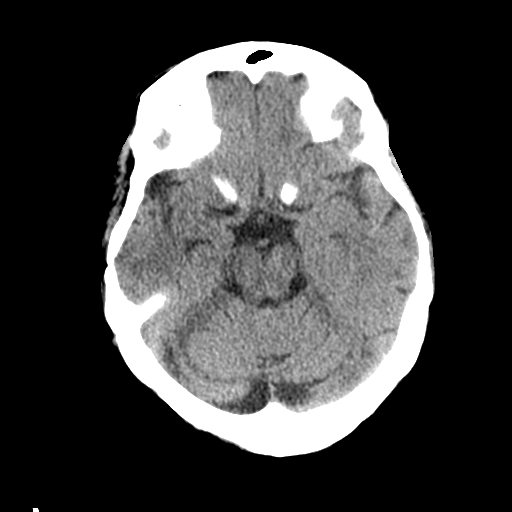
[im 12/30  brain]
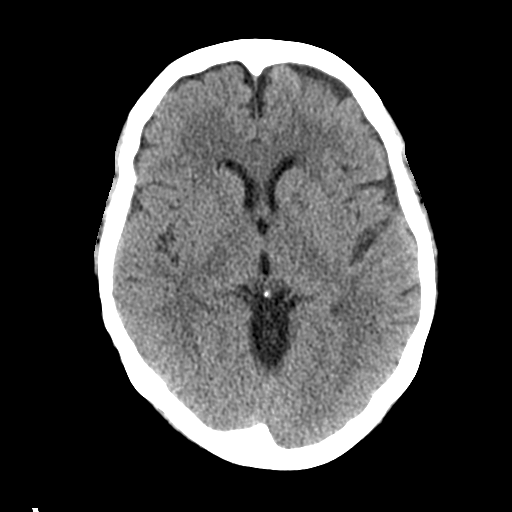
[im 16/30  brain]
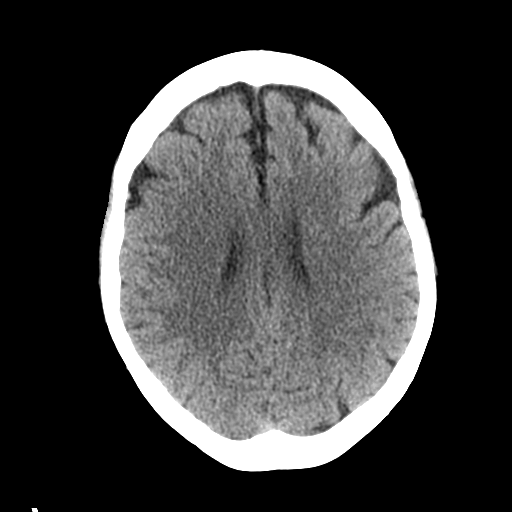
[im 16/30  bone]
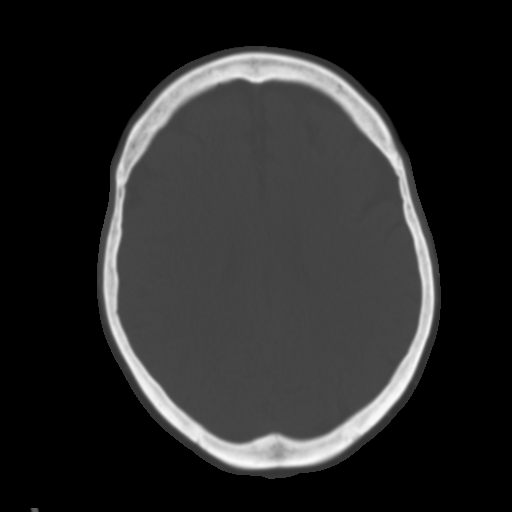
[im 19/30  brain]
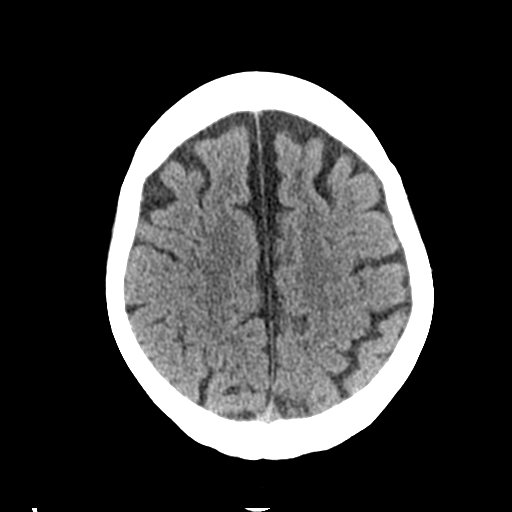
[im 22/30  brain]
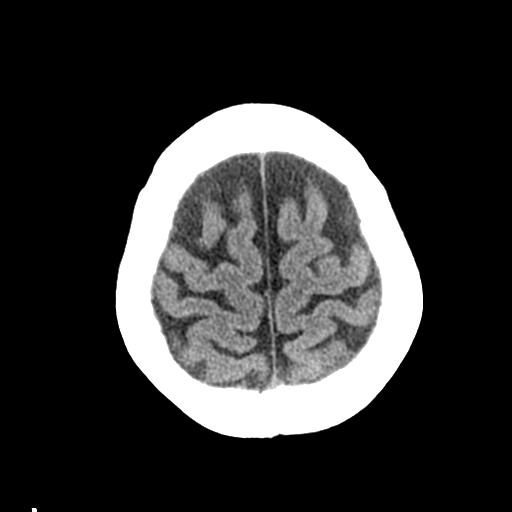
[im 25/30  brain]
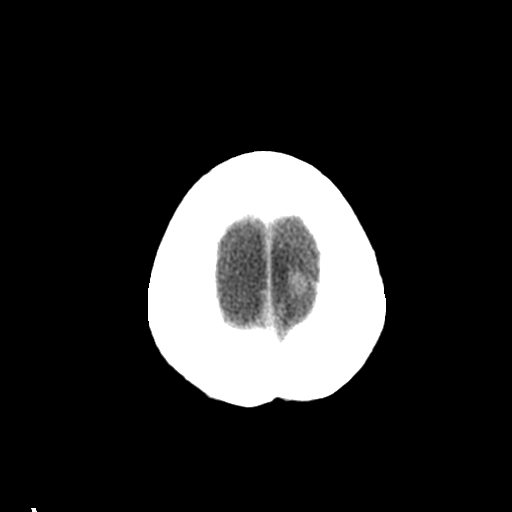
[im 28/30  brain]
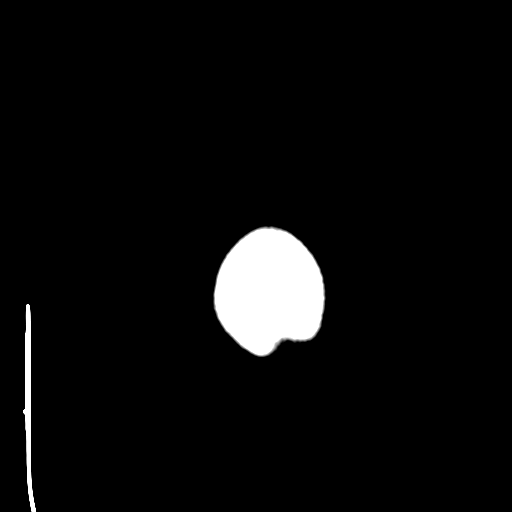
[im 28/30  bone]
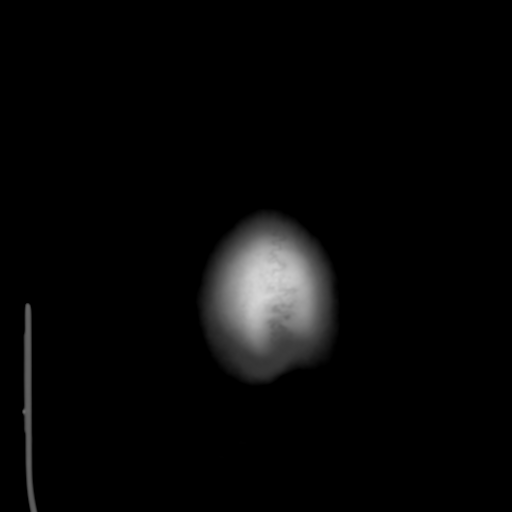

[Series 4: coronal soft tissue · coronal · 0.29mm/px · 3 of 61 slices shown]
[im 21/61  brain]
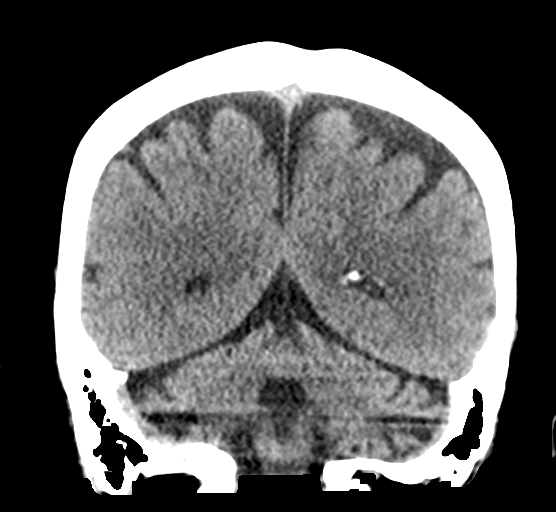
[im 27/61  brain]
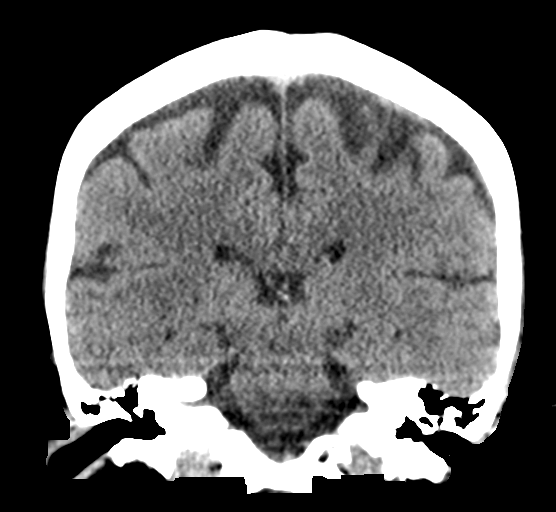
[im 34/61  brain]
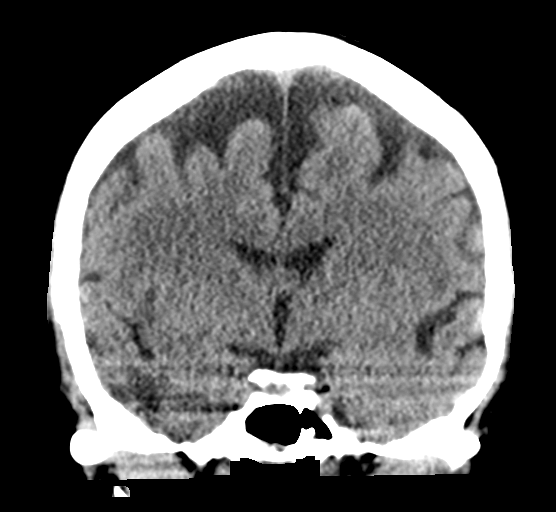

[Series 5: sagittal soft tissue · sagittal · 0.29mm/px · 3 of 51 slices shown]
[im 17/51  brain]
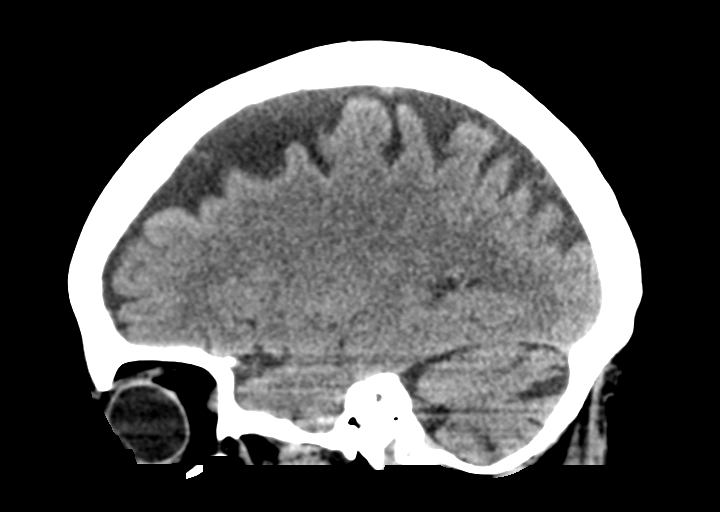
[im 26/51  brain]
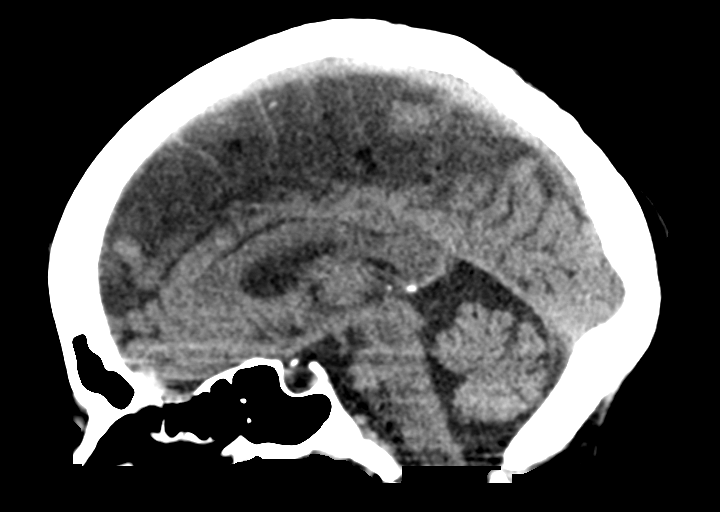
[im 34/51  brain]
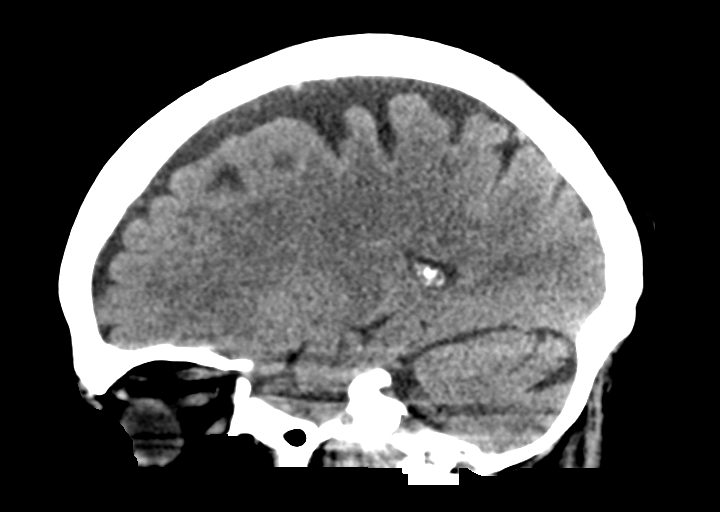

[15 of 47 positions shown; findings below may reference images not displayed]

FINDINGS: Brain: No acute territorial infarction, hemorrhage, or intracranial
mass. Encephalomalacia within the right temporal lobe as before.
Nonenlarged ventricles

Vascular: No hyperdense vessels. Scattered carotid vascular
calcification

Skull: Normal. Negative for fracture or focal lesion.

Sinuses/Orbits: Chronic deformity of the right zygomatic arch and
lateral wall of right orbit. Fluid levels in the maxillary sinuses.
Ethmoid sinus disease

Other: None
IMPRESSION: 1. No CT evidence for acute intracranial abnormality.
2. Stable right temporal lobe encephalomalacia
3. Maxillary sinusitis

## 2021-02-28 ENCOUNTER — Other Ambulatory Visit: Payer: Self-pay

## 2021-02-28 ENCOUNTER — Emergency Department: Payer: Medicare Other

## 2021-02-28 ENCOUNTER — Emergency Department
Admission: EM | Admit: 2021-02-28 | Discharge: 2021-02-28 | Disposition: A | Discharge status: Home / Self Care | Payer: Medicare Other | Attending: Emergency Medicine | Admitting: Emergency Medicine

## 2021-02-28 DIAGNOSIS — R1032 Left lower quadrant pain: Secondary | ICD-10-CM | POA: Insufficient documentation

## 2021-02-28 DIAGNOSIS — N390 Urinary tract infection, site not specified: Secondary | ICD-10-CM | POA: Diagnosis not present

## 2021-02-28 DIAGNOSIS — Z7952 Long term (current) use of systemic steroids: Secondary | ICD-10-CM | POA: Insufficient documentation

## 2021-02-28 DIAGNOSIS — Z7902 Long term (current) use of antithrombotics/antiplatelets: Secondary | ICD-10-CM | POA: Diagnosis not present

## 2021-02-28 DIAGNOSIS — J45909 Unspecified asthma, uncomplicated: Secondary | ICD-10-CM | POA: Insufficient documentation

## 2021-02-28 DIAGNOSIS — Z7951 Long term (current) use of inhaled steroids: Secondary | ICD-10-CM | POA: Diagnosis not present

## 2021-02-28 DIAGNOSIS — F17210 Nicotine dependence, cigarettes, uncomplicated: Secondary | ICD-10-CM | POA: Diagnosis not present

## 2021-02-28 DIAGNOSIS — J449 Chronic obstructive pulmonary disease, unspecified: Secondary | ICD-10-CM | POA: Diagnosis not present

## 2021-02-28 DIAGNOSIS — R569 Unspecified convulsions: Secondary | ICD-10-CM | POA: Insufficient documentation

## 2021-02-28 LAB — TROPONIN I (HIGH SENSITIVITY)
Troponin I (High Sensitivity): 4 ng/L (ref <18)
Troponin I (High Sensitivity): 5 ng/L (ref <18)

## 2021-02-28 LAB — CBC WITH DIFFERENTIAL/PLATELET
Abs Immature Granulocytes: 0.07 10*3/uL (ref 0.00–0.07)
Basophils Absolute: 0 10*3/uL (ref 0.0–0.1)
Basophils Relative: 0 %
Eosinophils Absolute: 0.1 10*3/uL (ref 0.0–0.5)
Eosinophils Relative: 2 %
HCT: 34.6 % — ABNORMAL LOW (ref 36.0–46.0)
Hemoglobin: 12.3 g/dL (ref 12.0–15.0)
Immature Granulocytes: 1 %
Lymphocytes Relative: 28 %
Lymphs Abs: 1.5 10*3/uL (ref 0.7–4.0)
MCH: 36.3 pg — ABNORMAL HIGH (ref 26.0–34.0)
MCHC: 35.5 g/dL (ref 30.0–36.0)
MCV: 102.1 fL — ABNORMAL HIGH (ref 80.0–100.0)
Monocytes Absolute: 0.5 10*3/uL (ref 0.1–1.0)
Monocytes Relative: 10 %
Neutro Abs: 3.1 10*3/uL (ref 1.7–7.7)
Neutrophils Relative %: 59 %
Platelets: 197 10*3/uL (ref 150–400)
RBC: 3.39 MIL/uL — ABNORMAL LOW (ref 3.87–5.11)
RDW: 12.7 % (ref 11.5–15.5)
WBC: 5.3 10*3/uL (ref 4.0–10.5)
nRBC: 0 % (ref 0.0–0.2)

## 2021-02-28 LAB — URINALYSIS, ROUTINE W REFLEX MICROSCOPIC
Bilirubin Urine: NEGATIVE
Glucose, UA: NEGATIVE mg/dL
Nitrite: POSITIVE — AB
Protein, ur: NEGATIVE mg/dL
Specific Gravity, Urine: 1.01 (ref 1.005–1.030)
pH: 6 (ref 5.0–8.0)

## 2021-02-28 LAB — COMPREHENSIVE METABOLIC PANEL
ALT: 14 U/L (ref 0–44)
AST: 17 U/L (ref 15–41)
Albumin: 4.1 g/dL (ref 3.5–5.0)
Alkaline Phosphatase: 71 U/L (ref 38–126)
Anion gap: 9 (ref 5–15)
BUN: 14 mg/dL (ref 8–23)
CO2: 27 mmol/L (ref 22–32)
Calcium: 9 mg/dL (ref 8.9–10.3)
Chloride: 100 mmol/L (ref 98–111)
Creatinine, Ser: 0.92 mg/dL (ref 0.44–1.00)
GFR, Estimated: >60 mL/min (ref >60)
Glucose, Bld: 88 mg/dL (ref 70–99)
Potassium: 3.4 mmol/L — ABNORMAL LOW (ref 3.5–5.1)
Sodium: 136 mmol/L (ref 135–145)
Total Bilirubin: 0.5 mg/dL (ref 0.3–1.2)
Total Protein: 6.3 g/dL — ABNORMAL LOW (ref 6.5–8.1)

## 2021-02-28 LAB — URINALYSIS, MICROSCOPIC (REFLEX): WBC, UA: >50 WBC/hpf (ref 0–5)

## 2021-02-28 LAB — LIPASE, BLOOD: Lipase: 29 U/L (ref 11–51)

## 2021-02-28 MED ORDER — IOHEXOL 350 MG/ML SOLN
100.0000 mL | Freq: Once | INTRAVENOUS | Status: AC | PRN
  Administered 2021-02-28: 100 mL via INTRAVENOUS

## 2021-02-28 MED ORDER — POTASSIUM CHLORIDE CRYS ER 20 MEQ PO TBCR
40.0000 meq | EXTENDED_RELEASE_TABLET | Freq: Once | ORAL | Status: AC
  Administered 2021-02-28: 40 meq via ORAL
  Filled 2021-02-28: qty 2

## 2021-02-28 MED ORDER — FOSFOMYCIN TROMETHAMINE 3 G PO PACK
3.0000 g | PACK | Freq: Once | ORAL | Status: AC
  Administered 2021-02-28: 3 g via ORAL
  Filled 2021-02-28: qty 3

## 2021-02-28 MED ORDER — PANTOPRAZOLE SODIUM 40 MG IV SOLR
40.0000 mg | Freq: Once | INTRAVENOUS | Status: AC
  Administered 2021-02-28: 40 mg via INTRAVENOUS
  Filled 2021-02-28: qty 40

## 2021-02-28 NOTE — ED Provider Notes (Signed)
[facility] Emergency Department Provider Note  ____________________________________________   Event Date/Time   First MD Initiated Contact with Patient 02/28/21 1938     (approximate)  I have reviewed the triage vital signs and the nursing notes.   HISTORY  Chief Complaint Seizures     HPI Marcia Brown is a 65 y.o. female with history of seizures on phenytoin, Keppra, Lamictal who comes in with concern for potential seizure activity.  Patient resides at a care facility.  She was there with her boyfriend when he states that he cannot sense her having a seizure and he reports some shaking while she was sitting in her chair.  It did not fall did not hit her head.  On EMS arrival 10 to 15 minutes after the episode she was not postictal and was completely alert and oriented x4 and moving all extremities without any issues.  They decided to send her out though be given she has not had a seizure in a long time but is not clear because it was not actually witnessed by any staff if she actually did truly have a seizure.  Patient is reporting some left lower quadrant abdominal pain at this time.  She is unclear when it started, constant, nothing makes it better or worse.  She does report a little bit of increased urination as well.  Denies any chest pain or shortness of breath at this time.          Past Medical History:  Diagnosis Date   Anxiety    Asthma    Cerebral ischemia    COPD (chronic obstructive pulmonary disease) ([place])    Depression    High cholesterol    Polyneuropathy    Seizures ([place])    Sleep apnea     Patient Active Problem List   Diagnosis Date Noted   Seizures ([place]) 08/04/2019   Acute lower UTI 06/26/2019   Hypokalemia 06/26/2019   AMS (altered mental status) 06/26/2019   Tobacco abuse 12/27/2015   Repeated falls 05/24/2015   Seizure ([place]) 02/03/2015   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    Past Surgical History:  Procedure Laterality Date    ESOPHAGEAL DILATION     FOOT SURGERY Right     Prior to Admission medications   Medication Sig Start Date End Date Taking? Authorizing Provider  albuterol (PROVENTIL HFA;VENTOLIN HFA) 108 (90 BASE) MCG/ACT inhaler Inhale 2 puffs into the lungs every 4 (four) hours as needed for wheezing or shortness of breath.    [provider]  alum & mag hydroxide-simeth (ANTACID LIQUID) 200-200-20 MG/5ML suspension Take 30 mLs by mouth every 6 (six) hours as needed for indigestion or heartburn.    [provider]  atorvastatin (LIPITOR) 10 MG tablet Take 10 mg by mouth at bedtime.     [provider]  cephALEXin (KEFLEX) 500 MG capsule Take 1 capsule (500 mg total) by mouth 3 (three) times daily. 11/04/20   Paulette Blanch, MD  cholecalciferol (VITAMIN D) 1000 units tablet Take 2,000 Units by mouth daily. Give with Vitamin D3 400 units    [provider]  cholecalciferol (VITAMIN D) 400 UNITS TABS tablet Take 400 Units by mouth daily. Give with Vitamin D3 2000 units    [provider]  clonazePAM (KLONOPIN) 0.5 MG tablet Take 0.5 mg by mouth 3 (three) times daily.     [provider]  clopidogrel (PLAVIX) 75 MG tablet Take 75 mg by mouth daily. 06/19/19  [provider]  cyanocobalamin 500 MCG tablet Take 500 mcg by mouth daily.    [provider]  diclofenac sodium (VOLTAREN) 1 % GEL Apply 4 g topically 3 (three) times daily. Apply to both knees    [provider]  diphenhydrAMINE (BENADRYL) 25 MG tablet Take 25 mg by mouth every 6 (six) hours as needed for allergies.    [provider]  DULoxetine (CYMBALTA) 60 MG capsule Take 60 mg by mouth daily.    [provider]  ferrous sulfate 325 (65 FE) MG tablet Take 325 mg by mouth 3 (three) times daily with meals.    [provider]  gabapentin (NEURONTIN) 100 MG capsule Take 200 mg by mouth 2 (two) times daily.    [provider]  lamoTRIgine  (LAMICTAL) 100 MG tablet Take 1.5 tablets (150 mg total) by mouth 2 (two) times daily. 09/06/20 12/05/20  Lucrezia Starch, MD  levETIRAcetam (KEPPRA) 500 MG tablet Take 1 tablet (500 mg total) by mouth 2 (two) times daily. Take one tablet (500) by mouth daily in the morning and one and one-half tablets (750 mg) in the evening 09/06/20 12/05/20  Lucrezia Starch, MD  loperamide (IMODIUM) 2 MG capsule Take 4 mg by mouth as needed for diarrhea or loose stools.    [provider]  magnesium hydroxide (MILK OF MAGNESIA) 400 MG/5ML suspension Take 30 mLs by mouth daily as needed for mild constipation or moderate constipation.    [provider]  mometasone-formoterol (DULERA) 100-5 MCG/ACT AERO Inhale 2 puffs into the lungs 2 (two) times daily.    [provider]  pantoprazole (PROTONIX) 40 MG tablet Take 40 mg by mouth daily before breakfast.     [provider]  phenytoin (DILANTIN) 100 MG ER capsule Take 100 mg by mouth 2 (two) times daily.     [provider]  potassium chloride SA (KLOR-CON M20) 20 MEQ tablet Take 1 tablet (20 mEq total) by mouth daily. 11/28/19   Hinda Kehr, MD  QUEtiapine (SEROQUEL) 25 MG tablet Take 25 mg by mouth at bedtime. 06/19/19   [provider]  sucralfate (CARAFATE) 1 g tablet Take 1 g by mouth 4 (four) times daily. 06/19/19   [provider]  VIMPAT 150 MG TABS Take 1 tablet by mouth 2 (two) times daily. 06/03/19   [provider]    Allergies Ciprofloxacin and Aspirin  Family History  Problem Relation Age of Onset   Breast cancer Daughter 33    Social History Social History   Tobacco Use   Smoking status: Every Day    Packs/day: 0.50    Types: Cigarettes   Smokeless tobacco: Never  Substance Use Topics   Alcohol use: No   Drug use: No      Review of Systems Constitutional: No fever/chills Eyes: No visual changes. ENT: No sore throat. Cardiovascular: Denies chest pain. Respiratory:  Denies shortness of breath. Gastrointestinal: Abdominal pain no nausea, no vomiting.  No diarrhea.  No constipation. Genitourinary: Negative for dysuria. Musculoskeletal: Negative for back pain. Skin: Negative for rash. Neurological: Negative for headaches, focal weakness or numbness.  Possible seizure All other ROS negative ____________________________________________   PHYSICAL EXAM:  VITAL SIGNS: ED Triage Vitals [02/28/21 1945]  Enc Vitals Group     BP 123/82     Pulse Rate 61     Resp 16     Temp 98.8 F (37.1 C)     Temp Source Oral  SpO2 97 %     Weight      Height      Head Circumference      Peak Flow      Pain Score      Pain Loc      Pain Edu?      Excl. in [place]?     Constitutional: Alert and oriented. Well appearing and in no acute distress. Eyes: Conjunctivae are normal. EOMI. Head: Atraumatic. Nose: No congestion/rhinnorhea. Mouth/Throat: Mucous membranes are moist.   Neck: No stridor. Trachea Midline. FROM Cardiovascular: Normal rate, regular rhythm. Grossly normal heart sounds.  Good peripheral circulation. Respiratory: Normal respiratory effort.  No retractions. Lungs CTAB. Gastrointestinal: Tender in left lower quadrant. No distention. No abdominal bruits.  Musculoskeletal: No lower extremity tenderness nor edema.  No joint effusions. Neurologic:  Normal speech and language. No gross focal neurologic deficits are appreciated.  Slightly weaker right leg compared to left leg the patient states that is baseline from her due to pain.  No sensation changes.  She is a little bit of a droopy right eyelid which she thinks is baseline for her otherwise neuro intact. Skin:  Skin is warm, dry and intact. No rash noted. Psychiatric: Mood and affect are normal. Speech and behavior are normal. GU: Deferred   ____________________________________________   LABS (all labs ordered are listed, but only abnormal results are displayed)  Labs Reviewed  CBC WITH  DIFFERENTIAL/PLATELET - Abnormal; Notable for the following components:      Result Value   RBC 3.39 (*)    HCT 34.6 (*)    MCV 102.1 (*)    MCH 36.3 (*)    All other components within normal limits  COMPREHENSIVE METABOLIC PANEL - Abnormal; Notable for the following components:   Potassium 3.4 (*)    Total Protein 6.3 (*)    All other components within normal limits  URINALYSIS, ROUTINE W REFLEX MICROSCOPIC - Abnormal; Notable for the following components:   APPearance HAZY (*)    Hgb urine dipstick LARGE (*)    Ketones, ur TRACE (*)    Nitrite POSITIVE (*)    Leukocytes,Ua SMALL (*)    All other components within normal limits  URINALYSIS, MICROSCOPIC (REFLEX) - Abnormal; Notable for the following components:   Bacteria, UA MANY (*)    All other components within normal limits  URINE CULTURE  LIPASE, BLOOD  TROPONIN I (HIGH SENSITIVITY)  TROPONIN I (HIGH SENSITIVITY)   ____________________________________________   ED ECG REPORT I, Vanessa [place], the attending physician, personally viewed and interpreted this ECG.  Normal sinus rate of 60, no ST elevation, no T wave versions, normal ____________________________________________  RADIOLOGY   Official radiology report(s): CT HEAD WO CONTRAST (5MM)  Result Date: 02/28/2021 CLINICAL DATA:  Nontraumatic seizure earlier today. EXAM: CT HEAD WITHOUT CONTRAST TECHNIQUE: Contiguous axial images were obtained from the base of the skull through the vertex without intravenous contrast. COMPARISON:  09/06/2020 FINDINGS: Brain: No evidence of acute infarction, hemorrhage, hydrocephalus, extra-axial collection or mass lesion/mass effect. Mild cerebral atrophy. Old encephalomalacia in the right temporal lobe corresponding to old fracture deformities in the overlying bones, likely old post traumatic and/or postoperative changes. Vascular: Moderate intracranial arterial vascular calcifications. Skull: No acute depressed skull fractures.  Sinuses/Orbits: Old fracture deformities demonstrated in the right temporal and sphenoid bones with involvement of the lateral orbital wall and zygomatic arch. Stable appearance since previous study. No acute fractures identified. Other: None. IMPRESSION: 1. No acute intracranial abnormalities. Mild  chronic atrophy. Stable encephalomalacia in the right temporal lobe. 2. Old fracture deformities and/or postoperative changes in the right temporal, sphenoid, and zygomatic bones. Electronically Signed   By: Lucienne Capers M.D.   On: 02/28/2021 21:01   CT ABDOMEN PELVIS W CONTRAST  Result Date: 02/28/2021 CLINICAL DATA:  Acute abdominal pain. Nonlocalized. Seizures earlier today. EXAM: CT ABDOMEN AND PELVIS WITH CONTRAST TECHNIQUE: Multidetector CT imaging of the abdomen and pelvis was performed using the standard protocol following bolus administration of intravenous contrast. CONTRAST:  165mL OMNIPAQUE IOHEXOL 350 MG/ML SOLN COMPARISON:  08/21/2017 FINDINGS: Lower chest: Lung bases are clear. Large esophageal hiatal hernia containing most of the stomach. The visualized distal esophagus demonstrates thickened wall, possibly reflux or esophagitis. Hepatobiliary: Surgical absence of the gallbladder. No bile duct dilatation. Mild diffuse fatty infiltration of the liver. No focal lesions. Pancreas: Unremarkable. No pancreatic ductal dilatation or surrounding inflammatory changes. Spleen: Normal in size without focal abnormality. Adrenals/Urinary Tract: Adrenal glands are unremarkable. Kidneys are normal, without renal calculi, focal lesion, or hydronephrosis. Bladder is unremarkable. Stomach/Bowel: The stomach, small bowel, and colon are not abnormally distended. Stool fills the colon. No wall thickening or inflammatory changes. Appendix is not identified. Vascular/Lymphatic: Aortic atherosclerosis. No enlarged abdominal or pelvic lymph nodes. Reproductive: No pelvic mass or lymphadenopathy. Other: No abdominal wall  hernia or abnormality. No abdominopelvic ascites. Musculoskeletal: No acute or significant osseous findings. IMPRESSION: 1. Large esophageal hiatal hernia containing most of the stomach. Visualized distal esophagus demonstrates thickened wall, possibly reflux or esophagitis. 2. Otherwise, no acute process demonstrated in the abdomen or pelvis. Electronically Signed   By: Lucienne Capers M.D.   On: 02/28/2021 21:15   DG Chest Portable 1 View  Result Date: 02/28/2021 CLINICAL DATA:  Chest pain. EXAM: PORTABLE CHEST 1 VIEW COMPARISON:  Radiograph 09/06/2020.  Chest CT 03/27/2019 FINDINGS: Heart is normal in size. There is a large retrocardiac hiatal hernia. No focal airspace disease. Minor atelectasis at the right lung base. No pneumothorax or pleural effusion. No pulmonary edema. No acute osseous abnormalities are seen. IMPRESSION: 1. Large hiatal hernia. 2. Minor right basilar atelectasis. Electronically Signed   By: Keith Rake M.D.   On: 02/28/2021 20:22    ____________________________________________   PROCEDURES  Procedure(s) performed (including Critical Care):  .1-3 Lead EKG Interpretation Performed by: Vanessa [place], MD Authorized by: Vanessa [place], MD     Interpretation: normal     ECG rate:  60s   ECG rate assessment: normal     Rhythm: sinus rhythm     Ectopy: none     Conduction: normal     ____________________________________________   INITIAL IMPRESSION / ASSESSMENT AND PLAN / ED COURSE  Marcia Brown was evaluated in Emergency Department on 02/28/2021 for the symptoms described in the history of present illness. She was evaluated in the context of the global COVID-19 pandemic, which necessitated consideration that the patient might be at risk for infection with the SARS-CoV-2 virus that causes COVID-19. Institutional protocols and algorithms that pertain to the evaluation of patients at risk for COVID-19 are in a state of rapid change based on information released by  regulatory bodies including the CDC and federal and state organizations. These policies and algorithms were followed during the patient's care in the ED.    Patient comes in with possible seizure episode but will also get an EKG and cardiac markers to make sure no evidence of ACS/syncope.  On examination she is tender in her lower abdomen  so we will get CT abdomen to make sure no evidence of diverticulitis abscess or other acute pathology and will get CT head to make sure no evidence of intercranial hemorrhage.  We will keep patient on the cardiac monitor while pending work-up  Work-up is reassuring.  Potassium slightly low will give some oral repletion.  EKG and troponin are reassuring and she denies any chest pain or shortness of breath.  Patient's urine does look consistent with UTI.  We will give a dose of fosfomycin given he has a lot of resistances.  This could have potentially caused the breakthrough seizure.  Her CT head was negative and CT abdomen shows a hiatal hernia which she is aware of.  Patient is already on a PPI for this.  10:07 PM  Patient has been ambulatory without any symptoms and seizures and at this time I feel comfortable with discharge.  She is been in the ER for over 2-1/2 hours without recurrent seizures            ____________________________________________   FINAL CLINICAL IMPRESSION(S) / ED DIAGNOSES   Final diagnoses:  Seizure ([place])  Urinary tract infection without hematuria, site unspecified      MEDICATIONS GIVEN DURING THIS VISIT:  Medications  potassium chloride SA (KLOR-CON) CR tablet 40 mEq (has no administration in time range)  iohexol (OMNIPAQUE) 350 MG/ML injection 100 mL (100 mLs Intravenous Contrast Given 02/28/21 2046)  pantoprazole (PROTONIX) injection 40 mg (40 mg Intravenous Given 02/28/21 2153)  fosfomycin (MONUROL) packet 3 g (3 g Oral Given 02/28/21 2153)     ED Discharge Orders     None        Note:  This document was  prepared using Dragon voice recognition software and may include unintentional dictation errors.    Vanessa [place], MD 02/28/21 2215

## 2021-02-28 NOTE — Discharge Instructions (Addendum)
We have given her a dose of antibiotics that will cover her for her UTI.  She does not need additional antibiotics at the facility.  She had no additional seizure-like activity here and had a negative CT scan of her head and her abdomen therefore she was felt to be safe for discharge home.  Continue to monitor at home and return to the ER if she develops any worsening symptoms or any other concern

## 2021-02-28 NOTE — ED Notes (Signed)
Pt brought in by EMS from [facility] [place] for reports of witnessed seizure by another resident .  Pt has PMHX of seizure but not had one in several months. No post ictal state noted by EMS on arrival at scene. BGL=134 PTA . Per reports Pt had no fall and remained on wheelchair throughout seizure episode. Pt scene in room AAOx3, in NAD

## 2021-02-28 NOTE — ED Notes (Signed)
Pt to radiology for CT scan.

## 2021-02-28 NOTE — ED Notes (Addendum)
Request to arrange EMS transport for this pt  [facility] made aware c/o Susa Loffler ,  Pt is discharge and going back to facility

## 2021-02-28 NOTE — ED Notes (Signed)
Called [facility] for transport to the [facility] [place]

## 2021-03-04 LAB — URINE CULTURE: Culture: >=100000 — AB

## 2021-05-12 ENCOUNTER — Other Ambulatory Visit: Payer: Self-pay

## 2021-05-12 DIAGNOSIS — Z79899 Other long term (current) drug therapy: Secondary | ICD-10-CM | POA: Diagnosis not present

## 2021-05-12 DIAGNOSIS — G40909 Epilepsy, unspecified, not intractable, without status epilepticus: Secondary | ICD-10-CM | POA: Insufficient documentation

## 2021-05-12 DIAGNOSIS — F17210 Nicotine dependence, cigarettes, uncomplicated: Secondary | ICD-10-CM | POA: Diagnosis not present

## 2021-05-12 DIAGNOSIS — J449 Chronic obstructive pulmonary disease, unspecified: Secondary | ICD-10-CM | POA: Diagnosis not present

## 2021-05-12 DIAGNOSIS — J45909 Unspecified asthma, uncomplicated: Secondary | ICD-10-CM | POA: Insufficient documentation

## 2021-05-12 DIAGNOSIS — R4182 Altered mental status, unspecified: Secondary | ICD-10-CM | POA: Diagnosis present

## 2021-05-12 LAB — PHENYTOIN LEVEL, TOTAL: Phenytoin Lvl: 16.4 ug/mL (ref 10.0–20.0)

## 2021-05-12 NOTE — ED Triage Notes (Signed)
Pt comes to ed from [facility]. Staff say they witnessed seizure activity. She does have hx. Pt normally able to recover quickly. This time she did not. Pt alert and oriented in triage.

## 2021-05-12 NOTE — ED Triage Notes (Signed)
Pt comes into the ED via EMS from the [facility] [place] , pt was found sitting in the hallway confused, pt has a hx of seizure and think she may have had a seizure. Denies any injury.  128/64 97%RA [id number]

## 2021-05-13 ENCOUNTER — Emergency Department
Admission: EM | Admit: 2021-05-13 | Discharge: 2021-05-13 | Disposition: A | Discharge status: Skilled Nursing Facility | Payer: Medicare Other | Attending: Emergency Medicine | Admitting: Emergency Medicine

## 2021-05-13 DIAGNOSIS — G40909 Epilepsy, unspecified, not intractable, without status epilepticus: Secondary | ICD-10-CM

## 2021-05-13 LAB — BASIC METABOLIC PANEL
Anion gap: 7 (ref 5–15)
BUN: 17 mg/dL (ref 8–23)
CO2: 25 mmol/L (ref 22–32)
Calcium: 9 mg/dL (ref 8.9–10.3)
Chloride: 105 mmol/L (ref 98–111)
Creatinine, Ser: 0.75 mg/dL (ref 0.44–1.00)
GFR, Estimated: >60 mL/min (ref >60)
Glucose, Bld: 89 mg/dL (ref 70–99)
Potassium: 3.9 mmol/L (ref 3.5–5.1)
Sodium: 137 mmol/L (ref 135–145)

## 2021-05-13 LAB — CBC
HCT: 37.2 % (ref 36.0–46.0)
Hemoglobin: 12.8 g/dL (ref 12.0–15.0)
MCH: 35.4 pg — ABNORMAL HIGH (ref 26.0–34.0)
MCHC: 34.4 g/dL (ref 30.0–36.0)
MCV: 102.8 fL — ABNORMAL HIGH (ref 80.0–100.0)
Platelets: 197 10*3/uL (ref 150–400)
RBC: 3.62 MIL/uL — ABNORMAL LOW (ref 3.87–5.11)
RDW: 12.9 % (ref 11.5–15.5)
WBC: 4.6 10*3/uL (ref 4.0–10.5)
nRBC: 0 % (ref 0.0–0.2)

## 2021-05-13 NOTE — ED Provider Notes (Signed)
[facility] Emergency Department Provider Note   ____________________________________________   I have reviewed the triage vital signs and the nursing notes.   HISTORY  Chief Complaint Altered Mental Status (Staff stated she had a seizure) and Seizures (Witnessed seizure took longer to come back around)   History limited by: Not Limited   HPI Marcia Brown is a 65 y.o. female who presents to the emergency department today from living facility after apparent seizure. The patient states that she has history of seizures. Denies any recent illness. Remembers going to bed but that is all. The patient denies any headaches. Denies any extremity pain except for some slight cramping in her forearms. Does not think she has missed any doses of her medication recently.   Records reviewed. Per medical record review patient has a history of seizures.   Past Medical History:  Diagnosis Date   Anxiety    Asthma    Cerebral ischemia    COPD (chronic obstructive pulmonary disease) ([place])    Depression    High cholesterol    Polyneuropathy    Seizures ([place])    Sleep apnea     Patient Active Problem List   Diagnosis Date Noted   Seizures ([place]) 08/04/2019   Acute lower UTI 06/26/2019   Hypokalemia 06/26/2019   AMS (altered mental status) 06/26/2019   Tobacco abuse 12/27/2015   Repeated falls 05/24/2015   Seizure ([place]) 02/03/2015   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    Past Surgical History:  Procedure Laterality Date   ESOPHAGEAL DILATION     FOOT SURGERY Right     Prior to Admission medications   Medication Sig Start Date End Date Taking? Authorizing Provider  albuterol (PROVENTIL HFA;VENTOLIN HFA) 108 (90 BASE) MCG/ACT inhaler Inhale 2 puffs into the lungs every 4 (four) hours as needed for wheezing or shortness of breath.    [provider]  alum & mag hydroxide-simeth (ANTACID LIQUID) 200-200-20 MG/5ML suspension Take 30 mLs by mouth every 6 (six) hours as  needed for indigestion or heartburn.    [provider]  atorvastatin (LIPITOR) 10 MG tablet Take 10 mg by mouth at bedtime.     [provider]  cephALEXin (KEFLEX) 500 MG capsule Take 1 capsule (500 mg total) by mouth 3 (three) times daily. 11/04/20   Paulette Blanch, MD  cholecalciferol (VITAMIN D) 1000 units tablet Take 2,000 Units by mouth daily. Give with Vitamin D3 400 units    [provider]  cholecalciferol (VITAMIN D) 400 UNITS TABS tablet Take 400 Units by mouth daily. Give with Vitamin D3 2000 units    [provider]  clonazePAM (KLONOPIN) 0.5 MG tablet Take 0.5 mg by mouth 3 (three) times daily.     [provider]  clopidogrel (PLAVIX) 75 MG tablet Take 75 mg by mouth daily. 06/19/19   [provider]  cyanocobalamin 500 MCG tablet Take 500 mcg by mouth daily.    [provider]  diclofenac sodium (VOLTAREN) 1 % GEL Apply 4 g topically 3 (three) times daily. Apply to both knees    [provider]  diphenhydrAMINE (BENADRYL) 25 MG tablet Take 25 mg by mouth every 6 (six) hours as needed for allergies.    [provider]  DULoxetine (CYMBALTA) 60 MG capsule Take 60 mg by mouth daily.    [provider]  ferrous sulfate 325 (65 FE) MG tablet Take 325 mg by mouth 3 (three) times daily with meals.  [provider]  gabapentin (NEURONTIN) 100 MG capsule Take 200 mg by mouth 2 (two) times daily.    [provider]  lamoTRIgine (LAMICTAL) 100 MG tablet Take 1.5 tablets (150 mg total) by mouth 2 (two) times daily. 09/06/20 12/05/20  Lucrezia Starch, MD  levETIRAcetam (KEPPRA) 500 MG tablet Take 1 tablet (500 mg total) by mouth 2 (two) times daily. Take one tablet (500) by mouth daily in the morning and one and one-half tablets (750 mg) in the evening 09/06/20 12/05/20  Lucrezia Starch, MD  loperamide (IMODIUM) 2 MG capsule Take 4 mg by mouth as needed for diarrhea or loose stools.    [provider]  magnesium hydroxide (MILK OF MAGNESIA) 400 MG/5ML suspension Take 30 mLs by mouth daily as needed for mild constipation or moderate constipation.    [provider]  mometasone-formoterol (DULERA) 100-5 MCG/ACT AERO Inhale 2 puffs into the lungs 2 (two) times daily.    [provider]  pantoprazole (PROTONIX) 40 MG tablet Take 40 mg by mouth daily before breakfast.     [provider]  phenytoin (DILANTIN) 100 MG ER capsule Take 100 mg by mouth 2 (two) times daily.     [provider]  potassium chloride SA (KLOR-CON M20) 20 MEQ tablet Take 1 tablet (20 mEq total) by mouth daily. 11/28/19   Hinda Kehr, MD  QUEtiapine (SEROQUEL) 25 MG tablet Take 25 mg by mouth at bedtime. 06/19/19   [provider]  sucralfate (CARAFATE) 1 g tablet Take 1 g by mouth 4 (four) times daily. 06/19/19   [provider]  VIMPAT 150 MG TABS Take 1 tablet by mouth 2 (two) times daily. 06/03/19   [provider]    Allergies Ciprofloxacin and Aspirin  Family History  Problem Relation Age of Onset   Breast cancer Daughter 6    Social History Social History   Tobacco Use   Smoking status: Every Day    Packs/day: 0.50    Types: Cigarettes   Smokeless tobacco: Never  Substance Use Topics   Alcohol use: No   Drug use: No    Review of Systems Constitutional: No fever/chills Eyes: No visual changes. ENT: No sore throat. Cardiovascular: Denies chest pain. Respiratory: Denies shortness of breath. Gastrointestinal: No abdominal pain.  No nausea, no vomiting.  No diarrhea.   Genitourinary: Negative for dysuria. Musculoskeletal: Cramping in forearms.  Skin: Negative for rash. Neurological: Negative for headaches, focal weakness or numbness.  ____________________________________________   PHYSICAL EXAM:  VITAL SIGNS: ED Triage Vitals  Enc Vitals Group     BP 05/12/21 1919 132/69     Pulse Rate 05/12/21 1919 (!) 59     Resp  05/12/21 1919 18     Temp 05/12/21 1919 98.1 F (36.7 C)     Temp Source 05/12/21 1919 Oral     SpO2 05/12/21 1919 98 %     Weight --      Height --      Head Circumference --      Peak Flow --      Pain Score 05/12/21 1920 4    Constitutional: Alert and oriented.  Eyes: Conjunctivae are normal.  ENT      Head: Normocephalic and atraumatic.      Nose: No congestion/rhinnorhea.      Mouth/Throat: Mucous membranes are moist.      Neck: No stridor. Hematological/Lymphatic/Immunilogical: No cervical lymphadenopathy. Cardiovascular: Normal rate, regular rhythm.  No murmurs, rubs,  or gallops.  Respiratory: Normal respiratory effort without tachypnea nor retractions. Breath sounds are clear and equal bilaterally. No wheezes/rales/rhonchi. Gastrointestinal: Soft and non tender. No rebound. No guarding.  Genitourinary: Deferred Musculoskeletal: Normal range of motion in all extremities. No lower extremity edema. Neurologic:  Normal speech and language. No gross focal neurologic deficits are appreciated.  Skin:  Skin is warm, dry and intact. No rash noted. Psychiatric: Mood and affect are normal. Speech and behavior are normal. Patient exhibits appropriate insight and judgment.  ____________________________________________    LABS (pertinent positives/negatives)  Dilantin 16.4 CBC wbc 4.6, hgb 12.8, plt 197 BMP wnl ____________________________________________   EKG  None  ____________________________________________    RADIOLOGY  None  ____________________________________________   PROCEDURES  Procedures  ____________________________________________   INITIAL IMPRESSION / ASSESSMENT AND PLAN / ED COURSE  Pertinent labs & imaging results that were available during my care of the patient were reviewed by me and considered in my medical decision making (see chart for details).   Patient presented to the emergency department today after apparent seizure. Patient has  history of seizures. On exam patient is awake and alert. No neuro deficits. Blood work without concerning electrolyte abnormality. At this time do think it is likely patient suffered seizure. Will plan on discharging back to facility.   ____________________________________________   FINAL CLINICAL IMPRESSION(S) / ED DIAGNOSES  Final diagnoses:  Seizure disorder [facility])     Note: This dictation was prepared with Dragon dictation. Any transcriptional errors that result from this process are unintentional     Nance Pear, MD 05/13/21 [phone]

## 2021-05-13 NOTE — Discharge Instructions (Signed)
Please seek medical attention for any high fevers, chest pain, shortness of breath, change in behavior, persistent vomiting, bloody stool or any other new or concerning symptoms.  

## 2021-06-22 ENCOUNTER — Other Ambulatory Visit: Payer: Self-pay

## 2021-06-22 ENCOUNTER — Emergency Department: Payer: Medicare Other

## 2021-06-22 ENCOUNTER — Emergency Department
Admission: EM | Admit: 2021-06-22 | Discharge: 2021-06-23 | Disposition: A | Discharge status: Home / Self Care | Payer: Medicare Other | Attending: Emergency Medicine | Admitting: Emergency Medicine

## 2021-06-22 DIAGNOSIS — Z20822 Contact with and (suspected) exposure to covid-19: Secondary | ICD-10-CM | POA: Insufficient documentation

## 2021-06-22 DIAGNOSIS — G40909 Epilepsy, unspecified, not intractable, without status epilepticus: Secondary | ICD-10-CM | POA: Insufficient documentation

## 2021-06-22 DIAGNOSIS — J45909 Unspecified asthma, uncomplicated: Secondary | ICD-10-CM | POA: Insufficient documentation

## 2021-06-22 DIAGNOSIS — J449 Chronic obstructive pulmonary disease, unspecified: Secondary | ICD-10-CM | POA: Insufficient documentation

## 2021-06-22 DIAGNOSIS — R059 Cough, unspecified: Secondary | ICD-10-CM | POA: Insufficient documentation

## 2021-06-22 DIAGNOSIS — R569 Unspecified convulsions: Secondary | ICD-10-CM

## 2021-06-22 DIAGNOSIS — R0981 Nasal congestion: Secondary | ICD-10-CM | POA: Diagnosis not present

## 2021-06-22 DIAGNOSIS — N3001 Acute cystitis with hematuria: Secondary | ICD-10-CM | POA: Insufficient documentation

## 2021-06-22 LAB — COMPREHENSIVE METABOLIC PANEL
ALT: 16 U/L (ref 0–44)
AST: 21 U/L (ref 15–41)
Albumin: 4 g/dL (ref 3.5–5.0)
Alkaline Phosphatase: 65 U/L (ref 38–126)
Anion gap: 8 (ref 5–15)
BUN: 14 mg/dL (ref 8–23)
CO2: 25 mmol/L (ref 22–32)
Calcium: 9.2 mg/dL (ref 8.9–10.3)
Chloride: 105 mmol/L (ref 98–111)
Creatinine, Ser: 0.73 mg/dL (ref 0.44–1.00)
GFR, Estimated: >60 mL/min (ref >60)
Glucose, Bld: 101 mg/dL — ABNORMAL HIGH (ref 70–99)
Potassium: 3.7 mmol/L (ref 3.5–5.1)
Sodium: 138 mmol/L (ref 135–145)
Total Bilirubin: 0.5 mg/dL (ref 0.3–1.2)
Total Protein: 6.8 g/dL (ref 6.5–8.1)

## 2021-06-22 LAB — CBC WITH DIFFERENTIAL/PLATELET
Abs Immature Granulocytes: 0.01 10*3/uL (ref 0.00–0.07)
Basophils Absolute: 0 10*3/uL (ref 0.0–0.1)
Basophils Relative: 1 %
Eosinophils Absolute: 0.1 10*3/uL (ref 0.0–0.5)
Eosinophils Relative: 2 %
HCT: 38.3 % (ref 36.0–46.0)
Hemoglobin: 12.6 g/dL (ref 12.0–15.0)
Immature Granulocytes: 0 %
Lymphocytes Relative: 32 %
Lymphs Abs: 1.4 10*3/uL (ref 0.7–4.0)
MCH: 35.3 pg — ABNORMAL HIGH (ref 26.0–34.0)
MCHC: 32.9 g/dL (ref 30.0–36.0)
MCV: 107.3 fL — ABNORMAL HIGH (ref 80.0–100.0)
Monocytes Absolute: 0.4 10*3/uL (ref 0.1–1.0)
Monocytes Relative: 10 %
Neutro Abs: 2.3 10*3/uL (ref 1.7–7.7)
Neutrophils Relative %: 55 %
Platelets: 186 10*3/uL (ref 150–400)
RBC: 3.57 MIL/uL — ABNORMAL LOW (ref 3.87–5.11)
RDW: 12.9 % (ref 11.5–15.5)
WBC: 4.2 10*3/uL (ref 4.0–10.5)
nRBC: 0 % (ref 0.0–0.2)

## 2021-06-22 NOTE — ED Triage Notes (Signed)
Pt comes into the ED via [facility] coming from the [facility] c/o seizure.  H/o seizure, per facility the seizure was twitching that lasted for 15 seconds.  A&Ox4.   67 96% RA 119/59 119 CBG

## 2021-06-22 NOTE — ED Triage Notes (Signed)
Pt to ED via [facility] from [place]. Facility called due to pt having a seizure that lasted approximately 15 seconds. Pt stating she doesn't remember what happened. Pt stating she has been med compliant with seizure medication and denies any recent dose changes. Pt alert and able to answer questions appropriately.

## 2021-06-23 DIAGNOSIS — G40909 Epilepsy, unspecified, not intractable, without status epilepticus: Secondary | ICD-10-CM | POA: Diagnosis not present

## 2021-06-23 LAB — RESP PANEL BY RT-PCR (FLU A&B, COVID) ARPGX2
Influenza A by PCR: NEGATIVE
Influenza B by PCR: NEGATIVE
SARS Coronavirus 2 by RT PCR: NEGATIVE

## 2021-06-23 LAB — URINALYSIS, COMPLETE (UACMP) WITH MICROSCOPIC
Bilirubin Urine: NEGATIVE
Glucose, UA: NEGATIVE mg/dL
Ketones, ur: NEGATIVE mg/dL
Nitrite: NEGATIVE
Protein, ur: NEGATIVE mg/dL
Specific Gravity, Urine: 1.014 (ref 1.005–1.030)
Squamous Epithelial / HPF: NONE SEEN (ref 0–5)
WBC, UA: >50 WBC/hpf — ABNORMAL HIGH (ref 0–5)
pH: 6 (ref 5.0–8.0)

## 2021-06-23 MED ORDER — SODIUM CHLORIDE 0.9 % IV SOLN
1.0000 g | Freq: Once | INTRAVENOUS | Status: AC
  Administered 2021-06-23: 1 g via INTRAVENOUS
  Filled 2021-06-23: qty 10

## 2021-06-23 MED ORDER — NITROFURANTOIN MONOHYD MACRO 100 MG PO CAPS
100.0000 mg | ORAL_CAPSULE | Freq: Once | ORAL | Status: AC
  Administered 2021-06-23: 100 mg via ORAL
  Filled 2021-06-23: qty 1

## 2021-06-23 MED ORDER — CEPHALEXIN 500 MG PO CAPS
500.0000 mg | ORAL_CAPSULE | Freq: Once | ORAL | Status: AC
  Administered 2021-06-23: 500 mg via ORAL
  Filled 2021-06-23: qty 1

## 2021-06-23 MED ORDER — NITROFURANTOIN MONOHYD MACRO 100 MG PO CAPS: 100.0000 mg | ORAL_CAPSULE | Freq: Two times a day (BID) | ORAL | 0 refills | Status: AC

## 2021-06-23 MED ORDER — CEPHALEXIN 500 MG PO CAPS: 500.0000 mg | ORAL_CAPSULE | Freq: Three times a day (TID) | ORAL | 0 refills | Status: AC

## 2021-06-23 NOTE — ED Provider Notes (Signed)
[facility] Provider Note    Event Date/Time   First MD Initiated Contact with Patient 06/22/21 2254     (approximate)   History   Seizures   HPI  Marcia Brown is a 66 y.o. female with a history of seizure disorder, COPD, asthma, apnea, hyperlipidemia who presents after having seizure.  [name] is compliance with her seizure medications.  Today she had a generalized tonic-clonic seizure lasting approximately 15 seconds per nursing home facility.  Patient denies any recent changes on the doses of her antiepileptic medications.  She does complain of a mild cough and congestion for the last few days.  She is also has had some dysuria and frequency with suprapubic pressure with urination for the last couple of days.  She has had UTIs in the past.  No flank pain, abdominal pain, nausea, vomiting, diarrhea, fever, chills, chest pain or shortness of breath.     Past Medical History:  Diagnosis Date   Anxiety    Asthma    Cerebral ischemia    COPD (chronic obstructive pulmonary disease) ([facility])    Depression    High cholesterol    Polyneuropathy    Seizures ([facility])    Sleep apnea     Past Surgical History:  Procedure Laterality Date   ESOPHAGEAL DILATION     FOOT SURGERY Right      Physical Exam   Triage Vital Signs: ED Triage Vitals [06/22/21 1759]  Enc Vitals Group     BP 132/81     Pulse Rate 63     Resp 18     Temp 98.7 F (37.1 C)     Temp Source Oral     SpO2 97 %     Weight 160 lb (72.6 kg)     Height 5' (1.524 m)     Head Circumference      Peak Flow      Pain Score 0     Pain Loc      Pain Edu?      Excl. in [place]?     Most recent vital signs: Vitals:   06/22/21 1759 06/23/21 0126  BP: 132/81 137/81  Pulse: 63 71  Resp: 18 18  Temp: 98.7 F (37.1 C)   SpO2: 97% 98%     Constitutional: Alert and oriented. Well appearing and in no apparent distress. HEENT:      Head: Normocephalic and atraumatic.         Eyes:  Conjunctivae are normal. Sclera is non-icteric.       Mouth/Throat: Mucous membranes are moist.       Neck: Supple with no signs of meningismus. Cardiovascular: Regular rate and rhythm. No murmurs, gallops, or rubs. 2+ symmetrical distal pulses are present in all extremities.  Respiratory: Normal respiratory effort. Lungs are clear to auscultation bilaterally.  Gastrointestinal: Soft, non tender, and non distended with positive bowel sounds. No rebound or guarding. Genitourinary: No CVA tenderness. Musculoskeletal:  No edema, cyanosis, or erythema of extremities. Neurologic: Normal speech and language. Face is symmetric. Moving all extremities. No gross focal neurologic deficits are appreciated. Skin: Skin is warm, dry and intact. No rash noted. Psychiatric: Mood and affect are normal. Speech and behavior are normal.  ED Results / Procedures / Treatments   Labs (all labs ordered are listed, but only abnormal results are displayed) Labs Reviewed  COMPREHENSIVE METABOLIC PANEL - Abnormal; Notable for the following components:      Result Value  Glucose, Bld 101 (*)    All other components within normal limits  CBC WITH DIFFERENTIAL/PLATELET - Abnormal; Notable for the following components:   RBC 3.57 (*)    MCV 107.3 (*)    MCH 35.3 (*)    All other components within normal limits  URINALYSIS, COMPLETE (UACMP) WITH MICROSCOPIC - Abnormal; Notable for the following components:   Color, Urine YELLOW (*)    APPearance HAZY (*)    Hgb urine dipstick MODERATE (*)    Leukocytes,Ua LARGE (*)    WBC, UA >50 (*)    Bacteria, UA RARE (*)    All other components within normal limits  RESP PANEL BY RT-PCR (FLU A&B, COVID) ARPGX2  URINE CULTURE  LEVETIRACETAM LEVEL  LAMOTRIGINE LEVEL  PHENYTOIN LEVEL, FREE AND TOTAL  LACOSAMIDE  CBG MONITORING, ED     EKG  ED ECG REPORT I, Rudene Re, the attending physician, personally viewed and interpreted this ECG.  Sinus bradycardia  with a rate of 59, normal intervals, normal axis, low voltage QRS, no ST elevations or depressions.   RADIOLOGY I, Rudene Re, attending MD, have personally viewed and interpreted the images obtained during this visit as below:  Chest x-ray with no signs of pneumonia   ___________________________________________________ Interpretation by Radiologist:  DG Chest 2 View  Result Date: 06/22/2021 CLINICAL DATA:  Cough. EXAM: CHEST - 2 VIEW COMPARISON:  Chest radiograph dated 03/01/2019 FINDINGS: No focal consolidation, pleural effusion or pneumothorax. The cardiac silhouette is within normal limits. Moderate hiatal hernia. No acute osseous pathology. IMPRESSION: 1. No active cardiopulmonary disease. 2. Moderate size hiatal hernia. Electronically Signed   By: Anner Crete M.D.   On: 06/22/2021 23:51      PROCEDURES:  Critical Care performed: No  Procedures    IMPRESSION / MDM / ASSESSMENT AND PLAN / ED COURSE  I reviewed the triage vital signs and the nursing notes.  66 y.o. female with a history of seizure disorder, COPD, asthma, apnea, hyperlipidemia who presents after having seizure.  Pliant with her medications.  Complaining of cough and congestion and dysuria for the last couple of days.  Patient is otherwise well-appearing in no distress with normal vital signs, no signs of trauma based on physical and history.  Exam is nonfocal.  Ddx: UTI versus COVID versus flu versus pneumonia versus dehydration versus AKI versus viral syndrome   Plan: COVID and flu swabs, chest x-ray, EKG, CBC, chemistry panel, urinalysis. will check Keppra level, Lamictal level, phenytoin level, lacosamide levels.   MEDICATIONS GIVEN IN ED: Medications  cephALEXin (KEFLEX) capsule 500 mg (has no administration in time range)  nitrofurantoin (macrocrystal-monohydrate) (MACROBID) capsule 100 mg (has no administration in time range)  cefTRIAXone (ROCEPHIN) 1 g in sodium chloride 0.9 % 100 mL IVPB  (0 g Intravenous Stopped 06/23/21 0307)     ED COURSE: COVID and flu negative.  Chest x-ray with no signs of pneumonia.  UA is positive for UTI.  Review of prior cultures show the patient grows Klebsiella sensitive to cephalosporins and resistant to Macrobid and E. coli resistance to cephalosporins but sensitive to nitrofurantoin.  No signs of sepsis with normal white count, no fever, no tachycardia.  Patient was given a dose of Rocephin however due to a kink defect on the tube part of that medication actually was not administered to the patient and extravasated onto the floor.  Therefore she was given a dose of oral Keflex and Macrobid to cover for both bacteria's.  Urine culture has  been sent.  No significant electrolyte derangements, AKI, or any signs of dehydration.  Patient was monitored in the ER for several hours with no recurrent seizures.  At this time with no signs of sepsis, no recurrent seizures and full return to baseline patient does not require admission to the hospital.  Will discharge back to her nursing home.  Discussed standard return precautions and close follow-up.   Consults: None   EMR reviewed including last visit to her neurologist on September 2022          FINAL CLINICAL IMPRESSION(S) / ED DIAGNOSES   Final diagnoses:  Seizure ([place])  Acute cystitis with hematuria     Rx / DC Orders   ED Discharge Orders          Ordered    cephALEXin (KEFLEX) 500 MG capsule  3 times daily        06/23/21 0302    nitrofurantoin, macrocrystal-monohydrate, (MACROBID) 100 MG capsule  2 times daily        06/23/21 0303             Note:  This document was prepared using Dragon voice recognition software and may include unintentional dictation errors.   Alfred Levins, [place], MD 06/23/21 [phone]

## 2021-06-23 NOTE — Discharge Instructions (Addendum)
Take both Keflex 500 mg 3 times a day and nitrofurantoin twice a day for 7 days.  Return to the emergency room for abdominal pain, fever or flank pain.

## 2021-06-23 NOTE — ED Notes (Signed)
Called [facility] for transport back to [facility]

## 2021-06-23 NOTE — ED Notes (Signed)
Approached patient in hall bed and noted a puddle of liquid at the head of the stretcher. Found a defect in the IV tubing from the rocephin causing it to leak on the floor. There is no way to determine accurately how much of the medication the patient received. Dr Alfred Levins notified and gave orders for oral keflex and macrobid.

## 2021-06-24 LAB — URINE CULTURE: Culture: >=100000 — AB

## 2021-06-26 LAB — PHENYTOIN LEVEL, FREE AND TOTAL
Phenytoin, Free: 0.8 ug/mL — ABNORMAL LOW (ref 1.0–2.0)
Phenytoin, Total: 13.7 ug/mL (ref 10.0–20.0)

## 2021-06-26 LAB — LAMOTRIGINE LEVEL: Lamotrigine Lvl: 1.4 ug/mL — ABNORMAL LOW (ref 2.0–20.0)

## 2021-06-27 LAB — LEVETIRACETAM LEVEL: Levetiracetam Lvl: 13.2 ug/mL (ref 10.0–40.0)

## 2021-06-29 LAB — LACOSAMIDE: Lacosamide: 2.9 ug/mL — ABNORMAL LOW (ref 5.0–10.0)

## 2021-11-01 ENCOUNTER — Emergency Department
Admission: EM | Admit: 2021-11-01 | Discharge: 2021-11-01 | Disposition: A | Discharge status: Home / Self Care | Payer: Medicare Other | Attending: Emergency Medicine | Admitting: Emergency Medicine

## 2021-11-01 ENCOUNTER — Other Ambulatory Visit: Payer: Self-pay

## 2021-11-01 ENCOUNTER — Emergency Department: Payer: Medicare Other

## 2021-11-01 DIAGNOSIS — S0101XA Laceration without foreign body of scalp, initial encounter: Secondary | ICD-10-CM | POA: Diagnosis not present

## 2021-11-01 DIAGNOSIS — Z23 Encounter for immunization: Secondary | ICD-10-CM | POA: Diagnosis not present

## 2021-11-01 DIAGNOSIS — W01198A Fall on same level from slipping, tripping and stumbling with subsequent striking against other object, initial encounter: Secondary | ICD-10-CM | POA: Diagnosis not present

## 2021-11-01 DIAGNOSIS — W19XXXA Unspecified fall, initial encounter: Secondary | ICD-10-CM

## 2021-11-01 DIAGNOSIS — S0990XA Unspecified injury of head, initial encounter: Secondary | ICD-10-CM | POA: Diagnosis present

## 2021-11-01 MED ORDER — TETANUS-DIPHTH-ACELL PERTUSSIS 5-2.5-18.5 LF-MCG/0.5 IM SUSY
0.5000 mL | PREFILLED_SYRINGE | Freq: Once | INTRAMUSCULAR | Status: AC
  Administered 2021-11-01: 0.5 mL via INTRAMUSCULAR
  Filled 2021-11-01: qty 0.5

## 2021-11-01 MED ORDER — ACETAMINOPHEN 500 MG PO TABS
1000.0000 mg | ORAL_TABLET | Freq: Once | ORAL | Status: AC
  Administered 2021-11-01: 1000 mg via ORAL
  Filled 2021-11-01: qty 2
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
## 2021-11-01 NOTE — ED Notes (Signed)
[facility] called to come pick up patient. They stated that they would come get the patient.

## 2021-11-01 NOTE — Discharge Instructions (Addendum)
You can take Tylenol 1 g every 8 hours to help with pain and return to the ER if you develop worsening symptoms or any other concerns otherwise your CT scans are as below and you will need to have staples removed in 10 to 14 days    IMPRESSION: No acute intracranial abnormality seen.   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 No acute abnormality is noted.

## 2021-12-26 ENCOUNTER — Ambulatory Visit (INDEPENDENT_AMBULATORY_CARE_PROVIDER_SITE_OTHER): Payer: Medicare Other | Admitting: Podiatry

## 2021-12-26 DIAGNOSIS — M79675 Pain in left toe(s): Secondary | ICD-10-CM

## 2021-12-26 DIAGNOSIS — M79674 Pain in right toe(s): Secondary | ICD-10-CM | POA: Diagnosis not present

## 2021-12-26 DIAGNOSIS — B351 Tinea unguium: Secondary | ICD-10-CM | POA: Diagnosis not present

## 2021-12-26 NOTE — Progress Notes (Signed)
   SUBJECTIVE Patient presents to office today complaining of elongated, thickened nails that cause pain while ambulating in shoes.  Patient is unable to trim their own nails. Patient is here for further evaluation and treatment.  Past Medical History:  Diagnosis Date   Anxiety    Asthma    Cerebral ischemia    COPD (chronic obstructive pulmonary disease) ([place])    Depression    High cholesterol    Polyneuropathy    Seizures ([place])    Sleep apnea     OBJECTIVE General Patient is awake, alert, and oriented x 3 and in no acute distress. Derm Skin is dry and supple bilateral. Negative open lesions or macerations. Remaining integument unremarkable. Nails are tender, long, thickened and dystrophic with subungual debris, consistent with onychomycosis, 1-5 bilateral. No signs of infection noted. Vasc  DP and PT pedal pulses palpable bilaterally. Temperature gradient within normal limits.  Neuro Epicritic and protective threshold sensation grossly intact bilaterally.  Musculoskeletal Exam No symptomatic pedal deformities noted bilateral. Muscular strength within normal limits.  ASSESSMENT 1.  Pain due to onychomycosis of toenails both  PLAN OF CARE 1. Patient evaluated today.  2. Instructed to maintain good pedal hygiene and foot care.  3. Mechanical debridement of nails 1-5 bilaterally performed using a nail nipper. Filed with dremel without incident.  4. Return to clinic in 3 mos.    Edrick Kins, DPM [facility]  Dr. Edrick Kins, DPM    [address], [place] [address]                Office [phone]  Fax [phone]

## 2022-06-05 ENCOUNTER — Encounter: Payer: Self-pay | Admitting: *Deleted

## 2022-06-05 ENCOUNTER — Emergency Department: Payer: Medicare Other

## 2022-06-05 ENCOUNTER — Other Ambulatory Visit: Payer: Self-pay

## 2022-06-05 DIAGNOSIS — J449 Chronic obstructive pulmonary disease, unspecified: Secondary | ICD-10-CM | POA: Diagnosis not present

## 2022-06-05 DIAGNOSIS — R109 Unspecified abdominal pain: Secondary | ICD-10-CM | POA: Insufficient documentation

## 2022-06-05 DIAGNOSIS — R079 Chest pain, unspecified: Secondary | ICD-10-CM | POA: Diagnosis present

## 2022-06-05 LAB — BASIC METABOLIC PANEL
Anion gap: 9 (ref 5–15)
BUN: 12 mg/dL (ref 8–23)
CO2: 24 mmol/L (ref 22–32)
Calcium: 9 mg/dL (ref 8.9–10.3)
Chloride: 100 mmol/L (ref 98–111)
Creatinine, Ser: 0.66 mg/dL (ref 0.44–1.00)
GFR, Estimated: >60 mL/min (ref >60)
Glucose, Bld: 107 mg/dL — ABNORMAL HIGH (ref 70–99)
Potassium: 3.3 mmol/L — ABNORMAL LOW (ref 3.5–5.1)
Sodium: 133 mmol/L — ABNORMAL LOW (ref 135–145)

## 2022-06-05 LAB — CBC
HCT: 40.9 % (ref 36.0–46.0)
Hemoglobin: 13.1 g/dL (ref 12.0–15.0)
MCH: 33.9 pg (ref 26.0–34.0)
MCHC: 32 g/dL (ref 30.0–36.0)
MCV: 106 fL — ABNORMAL HIGH (ref 80.0–100.0)
Platelets: 196 10*3/uL (ref 150–400)
RBC: 3.86 MIL/uL — ABNORMAL LOW (ref 3.87–5.11)
RDW: 12.6 % (ref 11.5–15.5)
WBC: 4 10*3/uL (ref 4.0–10.5)
nRBC: 0 % (ref 0.0–0.2)

## 2022-06-05 LAB — TROPONIN I (HIGH SENSITIVITY): Troponin I (High Sensitivity): 4 ng/L (ref <18)

## 2022-06-05 NOTE — ED Triage Notes (Addendum)
Pt brought in via [facility] with chest pain.  Pt states sob.  Cig smoker.  No cough.  No fever.  Sx began today.  Pt sleepy.  Pt lives at the [facility].

## 2022-06-05 NOTE — ED Provider Triage Note (Signed)
Emergency Medicine Provider Triage Evaluation Note  Marcia Brown, a 66 y.o. female  was evaluated in triage.  Pt complains of chest pain and SOB. She presents from the [facility] via EMS  Review of Systems  Positive: CP, SOB Negative: FCS  Physical Exam  BP 136/76 (BP Location: Left Arm)   Pulse 60   Temp 98.4 F (36.9 C) (Oral)   Resp 18   Ht 5' (1.524 m)   Wt 67 kg   SpO2 96%   BMI 28.85 kg/m  Gen:   Awake, no distress  NAD Resp:  Normal effort  MSK:   Moves extremities without difficulty  Other:    Medical Decision Making  Medically screening exam initiated at 10:09 PM.  Appropriate orders placed.  JACQUELYNN FRIEND was informed that the remainder of the evaluation will be completed by another provider, this initial triage assessment does not replace that evaluation, and the importance of remaining in the ED until their evaluation is complete.  Patient to the ED via EMS from her facility for c/o CP & SOB   Lisl Slingerland, Dannielle Karvonen, PA-C 06/05/22 2211

## 2022-06-05 NOTE — ED Notes (Signed)
[facility] sent to lab

## 2022-06-06 ENCOUNTER — Emergency Department: Payer: Medicare Other

## 2022-06-06 ENCOUNTER — Emergency Department
Admission: EM | Admit: 2022-06-06 | Discharge: 2022-06-06 | Disposition: A | Discharge status: Home / Self Care | Payer: Medicare Other | Attending: Emergency Medicine | Admitting: Emergency Medicine

## 2022-06-06 DIAGNOSIS — R079 Chest pain, unspecified: Secondary | ICD-10-CM

## 2022-06-06 LAB — URINALYSIS, ROUTINE W REFLEX MICROSCOPIC
Bilirubin Urine: NEGATIVE
Glucose, UA: NEGATIVE mg/dL
Ketones, ur: NEGATIVE mg/dL
Nitrite: NEGATIVE
Protein, ur: NEGATIVE mg/dL
Specific Gravity, Urine: 1.018 (ref 1.005–1.030)
pH: 5 (ref 5.0–8.0)

## 2022-06-06 LAB — HEPATIC FUNCTION PANEL
ALT: 88 U/L — ABNORMAL HIGH (ref 0–44)
AST: 150 U/L — ABNORMAL HIGH (ref 15–41)
Albumin: 4.5 g/dL (ref 3.5–5.0)
Alkaline Phosphatase: 86 U/L (ref 38–126)
Bilirubin, Direct: <0.1 mg/dL (ref 0.0–0.2)
Total Bilirubin: 0.7 mg/dL (ref 0.3–1.2)
Total Protein: 7.4 g/dL (ref 6.5–8.1)

## 2022-06-06 LAB — LIPASE, BLOOD: Lipase: 64 U/L — ABNORMAL HIGH (ref 11–51)

## 2022-06-06 LAB — TROPONIN I (HIGH SENSITIVITY): Troponin I (High Sensitivity): 5 ng/L (ref <18)

## 2022-06-06 NOTE — ED Provider Notes (Signed)
[facility] Provider Note    Event Date/Time   First MD Initiated Contact with Patient 06/06/22 [phone]     (approximate)  History   Chief Complaint: Chest Pain  HPI  Marcia Brown is a 66 y.o. female with a past medical history anxiety, COPD, neuropathy, chronic pain, seizure disorder, presents to the emergency department for chest/abdominal pain.  According to report patient is coming from the [facility] facility where she was complaining of some chest pain.  Denies any shortness of breath denied any fever or cough.  Patient had a prolonged wait approximate 12 hours in the waiting room prior to my evaluation.  When I saw the patient she states her main complaint is mostly pain in her hip which is chronic per patient.  Denies any falls or trauma.  Patient denies any chest pain currently denies any abdominal pain nausea vomiting or diarrhea.  Physical Exam   Triage Vital Signs: ED Triage Vitals  Enc Vitals Group     BP 06/05/22 2155 136/76     Pulse Rate 06/05/22 2155 60     Resp 06/05/22 2155 18     Temp 06/05/22 2152 (P) 98.4 F (36.9 C)     Temp Source 06/05/22 2152 (P) Oral     SpO2 06/05/22 2155 96 %     Weight 06/05/22 2151 149 lb (67.6 kg)     Height 06/05/22 2151 5' (1.524 m)     Head Circumference --      Peak Flow --      Pain Score 06/05/22 2201 5     Pain Loc --      Pain Edu? --      Excl. in [place]? --     Most recent vital signs: Vitals:   06/06/22 0235 06/06/22 0629  BP: 110/72 (!) 140/77  Pulse: (!) 57 (!) 59  Resp: 18 20  Temp: 98.1 F (36.7 C) 98.3 F (36.8 C)  SpO2: 95% 97%    General: Awake, no distress.  CV:  Good peripheral perfusion.  Regular rate and rhythm  Resp:  Normal effort.  Equal breath sounds bilaterally.  Abd:  No distention.  Soft, nontender.  No rebound or guarding.   ED Results / Procedures / Treatments   EKG  EKG viewed and interpreted by myself shows a normal sinus rhythm at 63 bpm with a  narrow QRS, normal axis, normal intervals, no concerning ST changes.  RADIOLOGY  I have reviewed and interpreted the chest x-ray images I do not see any obvious consolidation on my evaluation. Radiology is read the chest x-ray is negative besides a large hiatal hernia   MEDICATIONS ORDERED IN ED: Medications - No data to display   IMPRESSION / MDM / [place] / ED COURSE  I reviewed the triage vital signs and the nursing notes.  Patient's presentation is most consistent with acute presentation with potential threat to life or bodily function.  Patient presents emergency department initially for chest pain although currently denies any chest pain states she is having more pain in her left hip but denies any trauma.  Good range of motion, no tenderness to palpation of the hip.  Patient's workup in the emergency department is overall reassuring.  Patient's urinalysis shows no concerning findings, troponin is negative x 2, patient's chemistry overall reassuring liver function tests are a bit elevated however her ultrasound shows she is status post cholecystectomy with no other concerning findings.  I discussed these elevated liver function test with the patient and recommended she follow-up with her primary care doctor as this could possibly be indicative of a viral infection or possibly medication induced.  Patient denies any alcohol use.  Lipase slightly elevated as well although the patient again denies any chest pain or abdominal pain.  Overall given the patient's reassuring physical exam and reassuring workup reassuring vitals I believe the patient would be safe for discharge home to follow-up with her PCP.  Patient agreeable to plan of care.  Provided my normal chest pain return precautions.  FINAL CLINICAL IMPRESSION(S) / ED DIAGNOSES   Chest pain    Note:  This document was prepared using Dragon voice recognition software and may include unintentional dictation errors.    Harvest Dark, MD 06/06/22 [phone]

## 2022-06-06 NOTE — ED Notes (Signed)
[facility] called for transportation.

## 2022-06-06 NOTE — ED Notes (Signed)
Patient discharged to home per MD order. Patient in stable condition, and deemed medically cleared by ED provider for discharge. Discharge instructions reviewed with patient/family using "Teach Back"; verbalized understanding of medication education and administration, and information about follow-up care. Denies further concerns. ° °

## 2022-06-06 NOTE — Discharge Instructions (Signed)
As we discussed your workup in the emergency department is overall reassuring.  Your liver function test are slightly elevated as we discussed please follow-up with your primary care doctor regarding this as certain medications can cause this.  Please return to the emergency department for any fever any return of/worsening pain or any other symptom personally concerning to yourself or staff members.

## 2022-07-29 ENCOUNTER — Emergency Department
Admission: EM | Admit: 2022-07-29 | Discharge: 2022-07-29 | Disposition: A | Discharge status: Skilled Nursing Facility | Payer: Medicare Other | Attending: Emergency Medicine | Admitting: Emergency Medicine

## 2022-07-29 ENCOUNTER — Encounter: Payer: Self-pay | Admitting: Emergency Medicine

## 2022-07-29 DIAGNOSIS — G40909 Epilepsy, unspecified, not intractable, without status epilepticus: Secondary | ICD-10-CM | POA: Diagnosis present

## 2022-07-29 DIAGNOSIS — J449 Chronic obstructive pulmonary disease, unspecified: Secondary | ICD-10-CM | POA: Diagnosis not present

## 2022-07-29 LAB — CBC
HCT: 36.6 % (ref 36.0–46.0)
Hemoglobin: 12.3 g/dL (ref 12.0–15.0)
MCH: 34 pg (ref 26.0–34.0)
MCHC: 33.6 g/dL (ref 30.0–36.0)
MCV: 101.1 fL — ABNORMAL HIGH (ref 80.0–100.0)
Platelets: 183 10*3/uL (ref 150–400)
RBC: 3.62 MIL/uL — ABNORMAL LOW (ref 3.87–5.11)
RDW: 12.7 % (ref 11.5–15.5)
WBC: 3.9 10*3/uL — ABNORMAL LOW (ref 4.0–10.5)
nRBC: 0 % (ref 0.0–0.2)

## 2022-07-29 LAB — BASIC METABOLIC PANEL
Anion gap: 11 (ref 5–15)
BUN: 13 mg/dL (ref 8–23)
CO2: 23 mmol/L (ref 22–32)
Calcium: 8.6 mg/dL — ABNORMAL LOW (ref 8.9–10.3)
Chloride: 102 mmol/L (ref 98–111)
Creatinine, Ser: 0.77 mg/dL (ref 0.44–1.00)
GFR, Estimated: >60 mL/min (ref >60)
Glucose, Bld: 86 mg/dL (ref 70–99)
Potassium: 3.5 mmol/L (ref 3.5–5.1)
Sodium: 136 mmol/L (ref 135–145)

## 2022-07-29 LAB — CBG MONITORING, ED: Glucose-Capillary: 93 mg/dL (ref 70–99)

## 2022-07-29 MED ORDER — LEVETIRACETAM IN NACL 1000 MG/100ML IV SOLN
1000.0000 mg | Freq: Once | INTRAVENOUS | Status: AC
  Administered 2022-07-29: 1000 mg via INTRAVENOUS
  Filled 2022-07-29: qty 100

## 2022-07-29 NOTE — ED Triage Notes (Signed)
Pt arrives via EMS from [facility] with witnessed seizure by staff where pt was standing in doorway and shaking all over. Pt on keppra.

## 2022-07-29 NOTE — Discharge Instructions (Signed)
Continue taking your normal medications as prescribed.  Follow-up with Dr. Melrose Nakayama from neurology.  Return to the ER for new or recurrent seizures or any other new or worsening symptoms that concern you.

## 2022-07-29 NOTE — ED Notes (Signed)
Rachel Bo, at the [facility] approved transportation. Transport to be paid by for by aforementioned facility.

## 2022-07-29 NOTE — ED Provider Notes (Signed)
[facility] Provider Note    Event Date/Time   First MD Initiated Contact with Patient 07/29/22 Marcia Brown     (approximate)   History   Seizures   HPI  Marcia Brown is a 67 y.o. female with history of anxiety, COPD, neuropathy, chronic pain, and seizure disorder who presents after an apparent seizure.  The patient states she does not remember the seizure.  She is not sure how often she has them.  She states that she is not in charge of administering her medications so assumes that she has been taking everything she is prescribed.  She reports a mild headache but denies other acute symptoms.  Reviewed the past medical records.  The patient's most recent ED visit was on 12/27 for chest pain with a negative workup at that time.  Her most outpatient encounter was with Dr. Melrose Nakayama from neurology on 12/28.  She has a diagnosis of temporal lobe epilepsy.  She is on Lamictal, Vimpat, Dilantin, and Keppra.  Her Keppra dose was increased at that visit.   Physical Exam   Triage Vital Signs: ED Triage Vitals [07/29/22 1729]  Enc Vitals Group     BP 130/83     Pulse Rate (!) 55     Resp 18     Temp 99.1 F (37.3 C)     Temp Source Oral     SpO2 95 %     Weight 147 lb 11.3 oz (67 kg)     Height 5' (1.524 m)     Head Circumference      Peak Flow      Pain Score 0     Pain Loc      Pain Edu?      Excl. in [place]?     Most recent vital signs: Vitals:   07/29/22 1800 07/29/22 1900  BP: 122/77 111/85  Pulse: (!) 53 (!) 54  Resp: 18 12  Temp:    SpO2: 99% 98%     General: Alert and oriented, no distress. CV:  Good peripheral perfusion.  Resp:  Normal effort.  Abd:  No distention.  Other:  EOMI.  PERRLA.  Facial asymmetry which the patient states is chronic.  5/5 motor strength and intact sensation all extremities.  No ataxia.  No pronator drift.   ED Results / Procedures / Treatments   Labs (all labs ordered are listed, but only abnormal results are  displayed) Labs Reviewed  BASIC METABOLIC PANEL - Abnormal; Notable for the following components:      Result Value   Calcium 8.6 (*)    All other components within normal limits  CBC - Abnormal; Notable for the following components:   WBC 3.9 (*)    RBC 3.62 (*)    MCV 101.1 (*)    All other components within normal limits  LEVETIRACETAM LEVEL  CBG MONITORING, ED     EKG  ED ECG REPORT I, Arta Silence, the attending physician, personally viewed and interpreted this ECG.  Date: 07/29/2022 EKG Time: 1734 Rate: 54 Rhythm: normal sinus rhythm QRS Axis: normal Intervals: Incomplete RBBB ST/T Wave abnormalities: normal Narrative Interpretation: no evidence of acute ischemia    RADIOLOGY    PROCEDURES:  Critical Care performed: No  Procedures   MEDICATIONS ORDERED IN ED: Medications  levETIRAcetam (KEPPRA) IVPB 1000 mg/100 mL premix (0 mg Intravenous Stopped 07/29/22 1930)     IMPRESSION / MDM / ASSESSMENT AND PLAN / ED COURSE  I reviewed  the triage vital signs and the nursing notes.  67 year old female with PMH as noted above including seizure disorder on several medications presents after an apparent breakthrough seizure.  The patient is at a facility and so presumably she is receiving all of her medications as prescribed.  Her Keppra was recently increased.  On exam she is comfortable appearing.  Neurologic exam is nonfocal.  Differential diagnosis includes, but is not limited to, breakthrough seizure, syncope.  We will obtain basic labs, give IV Keppra in case the patient has a low level, send off a Keppra level, and observe her for few hours.  Patient's presentation is most consistent with acute presentation with potential threat to life or bodily function.  ----------------------------------------- 8:22 PM on 07/29/2022 -----------------------------------------  Electrolytes are normal.  There is no leukocytosis or anemia.  Keppra level is pending  but will not result tonight.  On reassessment the patient is comfortable appearing.  She has not had any recurrent seizures or other new symptoms.  She is stable for discharge.  I gave her strict return precautions and she expressed understanding.  She will follow-up with neurology.   FINAL CLINICAL IMPRESSION(S) / ED DIAGNOSES   Final diagnoses:  Seizure disorder ([place])     Rx / DC Orders   ED Discharge Orders     None        Note:  This document was prepared using Dragon voice recognition software and may include unintentional dictation errors.    Arta Silence, MD 07/29/22 2022

## 2022-07-29 NOTE — ED Notes (Signed)
Pt ambulated to toilet w/out assistance

## 2022-07-29 NOTE — ED Notes (Signed)
called to [facility] for transport [facility]/rep:chelesa.[name]

## 2022-08-19 ENCOUNTER — Other Ambulatory Visit: Payer: Self-pay

## 2022-08-19 ENCOUNTER — Emergency Department
Admission: EM | Admit: 2022-08-19 | Discharge: 2022-08-19 | Disposition: A | Discharge status: Skilled Nursing Facility | Payer: Medicare Other | Attending: Emergency Medicine | Admitting: Emergency Medicine

## 2022-08-19 DIAGNOSIS — G40909 Epilepsy, unspecified, not intractable, without status epilepticus: Secondary | ICD-10-CM | POA: Insufficient documentation

## 2022-08-19 DIAGNOSIS — R569 Unspecified convulsions: Secondary | ICD-10-CM

## 2022-08-19 NOTE — ED Triage Notes (Signed)
Pt arrives via EMS from SNF for possible seizure. Staff reported to EMS tonic clonic movement for 10 minutes. Pt has dementia and is oriented to self. EMS did not appreciate any postictal state or witness any additional seizure activity. Staff reported to EMS that she fell last week and hit her head.

## 2022-08-19 NOTE — ED Provider Notes (Signed)
   [facility] Provider Note    Event Date/Time   First MD Initiated Contact with Patient 08/19/22 1711     (approximate)   History   Seizures   HPI  Marcia Brown is a 67 y.o. female with a history of seizures on 4 different antiepileptics who presents after reported seizure.  EMS reports no postictal state, patient feels well and has no complaints.  Denies tongue injury or loss of bladder continence.  She does see Dr. Melrose Nakayama of neurology     Physical Exam   Triage Vital Signs: ED Triage Vitals  Enc Vitals Group     BP 08/19/22 1716 125/66     Pulse Rate 08/19/22 1716 (!) 56     Resp 08/19/22 1716 17     Temp 08/19/22 1716 98.3 F (36.8 C)     Temp Source 08/19/22 1716 Oral     SpO2 08/19/22 1716 96 %     Weight --      Height --      Head Circumference --      Peak Flow --      Pain Score 08/19/22 1714 0     Pain Loc --      Pain Edu? --      Excl. in [place]? --     Most recent vital signs: Vitals:   08/19/22 1730 08/19/22 1800  BP: 136/69 129/67  Pulse: (!) 56 (!) 55  Resp: 13 14  Temp:    SpO2: 97% 98%     General: Awake, no distress.  CV:  Good peripheral perfusion.  Resp:  Normal effort.  Abd:  No distention.  No loss of bladder continence Other:  No intraoral injury   ED Results / Procedures / Treatments   Labs (all labs ordered are listed, but only abnormal results are displayed) Labs Reviewed - No data to display   EKG     RADIOLOGY     PROCEDURES:  Critical Care performed:   Procedures   MEDICATIONS ORDERED IN ED: Medications - No data to display   IMPRESSION / MDM / [place] / ED COURSE  I reviewed the triage vital signs and the nursing notes. Patient's presentation is most consistent with exacerbation of chronic illness.  Patient with known seizure disorder presents after a seizure.  Well-appearing and in no acute distress, will observe in the emergency department to ensure no further  seizure activity  Patient remains quite comfortable and well-appearing, appropriate for discharge at this time with close follow-up with neurology        FINAL CLINICAL IMPRESSION(S) / ED DIAGNOSES   Final diagnoses:  Seizure ([place])     Rx / DC Orders   ED Discharge Orders     None        Note:  This document was prepared using Dragon voice recognition software and may include unintentional dictation errors.   Marcia Drafts, MD 08/19/22 [name]

## 2022-09-07 ENCOUNTER — Emergency Department
Admission: EM | Admit: 2022-09-07 | Discharge: 2022-09-07 | Disposition: A | Discharge status: Home / Self Care | Payer: Medicare Other | Attending: Emergency Medicine | Admitting: Emergency Medicine

## 2022-09-07 ENCOUNTER — Other Ambulatory Visit: Payer: Self-pay

## 2022-09-07 ENCOUNTER — Emergency Department: Payer: Medicare Other

## 2022-09-07 ENCOUNTER — Emergency Department
Admission: EM | Admit: 2022-09-07 | Discharge: 2022-09-08 | Disposition: A | Discharge status: Skilled Nursing Facility | Payer: Medicare Other | Source: Home / Self Care | Attending: Emergency Medicine | Admitting: Emergency Medicine

## 2022-09-07 DIAGNOSIS — J45909 Unspecified asthma, uncomplicated: Secondary | ICD-10-CM | POA: Insufficient documentation

## 2022-09-07 DIAGNOSIS — R319 Hematuria, unspecified: Secondary | ICD-10-CM | POA: Insufficient documentation

## 2022-09-07 DIAGNOSIS — K449 Diaphragmatic hernia without obstruction or gangrene: Secondary | ICD-10-CM

## 2022-09-07 DIAGNOSIS — R1010 Upper abdominal pain, unspecified: Secondary | ICD-10-CM | POA: Diagnosis not present

## 2022-09-07 DIAGNOSIS — J449 Chronic obstructive pulmonary disease, unspecified: Secondary | ICD-10-CM | POA: Insufficient documentation

## 2022-09-07 DIAGNOSIS — J4489 Other specified chronic obstructive pulmonary disease: Secondary | ICD-10-CM | POA: Insufficient documentation

## 2022-09-07 DIAGNOSIS — N390 Urinary tract infection, site not specified: Secondary | ICD-10-CM | POA: Insufficient documentation

## 2022-09-07 DIAGNOSIS — G319 Degenerative disease of nervous system, unspecified: Secondary | ICD-10-CM | POA: Insufficient documentation

## 2022-09-07 DIAGNOSIS — R1013 Epigastric pain: Secondary | ICD-10-CM | POA: Insufficient documentation

## 2022-09-07 DIAGNOSIS — R109 Unspecified abdominal pain: Secondary | ICD-10-CM

## 2022-09-07 LAB — URINALYSIS, W/ REFLEX TO CULTURE (INFECTION SUSPECTED)
Bilirubin Urine: NEGATIVE
Glucose, UA: NEGATIVE mg/dL
Ketones, ur: NEGATIVE mg/dL
Nitrite: NEGATIVE
Protein, ur: 100 mg/dL — AB
RBC / HPF: >50 RBC/hpf (ref 0–5)
Specific Gravity, Urine: 1.028 (ref 1.005–1.030)
Squamous Epithelial / HPF: >50 /HPF (ref 0–5)
WBC, UA: >50 WBC/hpf (ref 0–5)
pH: 6 (ref 5.0–8.0)

## 2022-09-07 LAB — COMPREHENSIVE METABOLIC PANEL
ALT: 21 U/L (ref 0–44)
AST: 22 U/L (ref 15–41)
Albumin: 3.5 g/dL (ref 3.5–5.0)
Alkaline Phosphatase: 75 U/L (ref 38–126)
Anion gap: 10 (ref 5–15)
BUN: 18 mg/dL (ref 8–23)
CO2: 22 mmol/L (ref 22–32)
Calcium: 8.7 mg/dL — ABNORMAL LOW (ref 8.9–10.3)
Chloride: 102 mmol/L (ref 98–111)
Creatinine, Ser: 0.96 mg/dL (ref 0.44–1.00)
GFR, Estimated: >60 mL/min (ref >60)
Glucose, Bld: 129 mg/dL — ABNORMAL HIGH (ref 70–99)
Potassium: 3.1 mmol/L — ABNORMAL LOW (ref 3.5–5.1)
Sodium: 134 mmol/L — ABNORMAL LOW (ref 135–145)
Total Bilirubin: 0.7 mg/dL (ref 0.3–1.2)
Total Protein: 7 g/dL (ref 6.5–8.1)

## 2022-09-07 LAB — LIPASE, BLOOD: Lipase: 22 U/L (ref 11–51)

## 2022-09-07 LAB — CBC WITH DIFFERENTIAL/PLATELET
Abs Immature Granulocytes: 0.02 10*3/uL (ref 0.00–0.07)
Basophils Absolute: 0 10*3/uL (ref 0.0–0.1)
Basophils Relative: 0 %
Eosinophils Absolute: 0.1 10*3/uL (ref 0.0–0.5)
Eosinophils Relative: 1 %
HCT: 33.5 % — ABNORMAL LOW (ref 36.0–46.0)
Hemoglobin: 11.5 g/dL — ABNORMAL LOW (ref 12.0–15.0)
Immature Granulocytes: 0 %
Lymphocytes Relative: 10 %
Lymphs Abs: 0.7 10*3/uL (ref 0.7–4.0)
MCH: 34 pg (ref 26.0–34.0)
MCHC: 34.3 g/dL (ref 30.0–36.0)
MCV: 99.1 fL (ref 80.0–100.0)
Monocytes Absolute: 0.9 10*3/uL (ref 0.1–1.0)
Monocytes Relative: 13 %
Neutro Abs: 5.1 10*3/uL (ref 1.7–7.7)
Neutrophils Relative %: 76 %
Platelets: 162 10*3/uL (ref 150–400)
RBC: 3.38 MIL/uL — ABNORMAL LOW (ref 3.87–5.11)
RDW: 12.1 % (ref 11.5–15.5)
WBC: 6.8 10*3/uL (ref 4.0–10.5)
nRBC: 0 % (ref 0.0–0.2)

## 2022-09-07 LAB — TROPONIN I (HIGH SENSITIVITY)
Troponin I (High Sensitivity): 8 ng/L (ref <18)
Troponin I (High Sensitivity): 7 ng/L (ref <18)

## 2022-09-07 MED ORDER — CEFDINIR 300 MG PO CAPS: 300.0000 mg | ORAL_CAPSULE | Freq: Two times a day (BID) | ORAL | 0 refills | Status: AC

## 2022-09-07 MED ORDER — IOHEXOL 300 MG/ML  SOLN
100.0000 mL | Freq: Once | INTRAMUSCULAR | Status: AC | PRN
  Administered 2022-09-07: 100 mL via INTRAVENOUS

## 2022-09-07 MED ORDER — SODIUM CHLORIDE 0.9 % IV SOLN
1.0000 g | Freq: Once | INTRAVENOUS | Status: AC
  Administered 2022-09-07: 1 g via INTRAVENOUS
  Filled 2022-09-07: qty 10

## 2022-09-07 NOTE — ED Triage Notes (Signed)
Pt arrived via EMS from [facility] for complaint of upper abd pain, unknown pain start time. Pt states it is worse when sitting up or bending over. Pt is only oriented to self which is baseline. Per EMS pt did have a fall this morning, no LOC, no blood thinners, did not hit head. Pt does have a skin tear to right arm from fall. Pt denies N/V/D, Cp, SB. Pt has hx of seizures. Pt is ambulatory with walker.

## 2022-09-07 NOTE — ED Notes (Signed)
Per MD, "since pt is a hard stick go ahead and started abx and I will d/c BC."

## 2022-09-07 NOTE — ED Notes (Signed)
[facility]  CALLED  TO TRANSPORT  PT  TO  [facility]

## 2022-09-07 NOTE — ED Provider Notes (Signed)
[facility] Provider Note    Event Date/Time   First MD Initiated Contact with Patient 09/07/22 2311     (approximate)   History   Abdominal Pain   HPI  Marcia Brown is a 67 y.o. female to the ED via EMS from [place].  Patient returns to the ED for continued abdominal pain.  She was seen in the ED approximately 12 hours ago for same.  Complains of central abdominal pain worsened by bending over.  She had an evaluation including CT scan and diagnosed with UTI and placed on antibiotics.  Patient denies any new symptoms since discharge from the emergency department.  Specifically, denies fever/chills, chest pain, shortness of breath, nausea, vomiting or diarrhea.     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 Authorizing Provider  sucralfate (CARAFATE) 1 GM/10ML suspension Take 10 mLs (1 g total) by mouth 4 (four) times daily. 09/08/22  Yes Paulette Blanch, MD  albuterol (PROVENTIL HFA;VENTOLIN HFA) 108 (90 BASE) MCG/ACT inhaler Inhale 2 puffs into the lungs every 4 (four) hours as needed for wheezing or shortness of breath.    [provider]  alum & mag hydroxide-simeth (MAALOX/MYLANTA) 200-200-20  MG/5ML suspension Take 30 mLs by mouth every 6 (six) hours as needed for indigestion or heartburn.    [provider]  atorvastatin (LIPITOR) 10 MG tablet Take 10 mg by mouth at bedtime.    [provider]  azelastine (OPTIVAR) 0.05 % ophthalmic solution Apply 1 drop to eye 2 (two) times daily. 04/18/21   [provider]  cefdinir (OMNICEF) 300 MG capsule Take 1 capsule (300 mg total) by mouth 2 (two) times daily for 5 days. 09/07/22 09/12/22  Naaman Plummer, MD  celecoxib (CELEBREX) 100 MG capsule Take 100 mg by mouth 2 (two) times daily. 04/21/21   [provider]  cholecalciferol (VITAMIN D) 1000 units tablet Take 2,000 Units by mouth daily. Give with Vitamin D3 400 units    [provider]  cholecalciferol (VITAMIN D) 400 UNITS TABS tablet Take 400 Units by mouth daily. Give with Vitamin D3 2000 units    [provider]  clonazePAM (KLONOPIN) 0.5 MG tablet Take 0.5 mg by mouth 3 (three) times daily.    [provider]  clopidogrel (PLAVIX) 75 MG tablet Take 75 mg by mouth daily. 06/19/19   [provider]  cyanocobalamin 500 MCG tablet Take 500 mcg by mouth daily.    [provider]  diclofenac sodium (VOLTAREN) 1 % GEL Apply 4 g topically 3 (three)  times daily. Apply to both knees    [provider]  diphenhydrAMINE (BENADRYL) 25 MG tablet Take 25 mg by mouth every 6 (six) hours as needed for allergies.    [provider]  docusate sodium (COLACE) 50 MG capsule Take 50 mg by mouth daily.    [provider]  DULoxetine (CYMBALTA) 60 MG capsule Take 60 mg by mouth daily.    [provider]  ferrous sulfate 325 (65 FE) MG tablet Take 325 mg by mouth 3 (three) times a week.    [provider]  gabapentin (NEURONTIN) 100 MG capsule Take 200 mg by mouth 2 (two) times daily.    [provider]  lamoTRIgine (LAMICTAL) 100 MG tablet Take 1.5 tablets (150 mg total) by mouth 2  (two) times daily. 09/06/20 05/13/21  Lucrezia Starch, MD  levETIRAcetam (KEPPRA) 500 MG tablet Take 1 tablet (500 mg total) by mouth 2 (two) times daily. Take one tablet (500) by mouth daily in the morning and one and one-half tablets (750 mg) in the evening 09/06/20 05/13/21  Lucrezia Starch, MD  loperamide (IMODIUM) 2 MG capsule Take 4 mg by mouth as needed for diarrhea or loose stools.    [provider]  loratadine (CLARITIN) 10 MG tablet Take 10 mg by mouth daily.    [provider]  losartan (COZAAR) 25 MG tablet Take 25 mg by mouth daily.    [provider]  magnesium hydroxide (MILK OF MAGNESIA) 400 MG/5ML suspension Take 30 mLs by mouth daily as needed for mild constipation or moderate constipation.    [provider]  mometasone-formoterol (DULERA) 100-5 MCG/ACT AERO Inhale 2 puffs into the lungs 2 (two) times daily.    [provider]  Multiple Vitamin (MULTIVITAMIN WITH MINERALS) TABS tablet Take 1 tablet by mouth daily.    [provider]  pantoprazole (PROTONIX) 40 MG tablet Take 40 mg by mouth daily before breakfast.     [provider]  phenytoin (DILANTIN) 100 MG ER capsule Take 100 mg by mouth 2 (two) times daily.     [provider]  potassium chloride SA (KLOR-CON M20) 20 MEQ tablet Take 1 tablet (20 mEq total) by mouth daily. 11/28/19   Hinda Kehr, MD  QUEtiapine (SEROQUEL) 25 MG tablet Take 25 mg by mouth at bedtime. 06/19/19   [provider]  rosuvastatin (CRESTOR) 5 MG tablet Take 5 mg by mouth daily.    [provider]  traMADol-acetaminophen (ULTRACET) 37.5-325 MG tablet Take 1 tablet by mouth every 8 (eight) hours as needed.    [provider]  VIMPAT 150 MG TABS Take 1 tablet by mouth 2 (two) times daily. 06/03/19   [provider]     Allergies  Ciprofloxacin and Aspirin   Family History   Family History  Problem Relation Age of Onset   Breast cancer  Daughter 54     Physical Exam  Triage Vital Signs: ED Triage Vitals  Enc Vitals Group     BP 09/07/22 2220 100/69     Pulse Rate 09/07/22 2220 68     Resp 09/07/22 2220 18     Temp 09/07/22 2220 98.9 F (37.2 C)     Temp Source 09/07/22 2220 Oral     SpO2 09/07/22 2220 100 %     Weight 09/07/22 2219 158 lb 1.1 oz (71.7 kg)     Height 09/07/22 2219 5' (1.524 m)     Head Circumference --  Peak Flow --      Pain Score 09/07/22 2219 7     Pain Loc --      Pain Edu? --      Excl. in [place]? --     Updated Vital Signs: BP 104/62   Pulse 64   Temp 98.9 F (37.2 C) (Oral)   Resp 20   Ht 5' (1.524 m)   Wt 71.7 kg   SpO2 100%   BMI 30.87 kg/m    General: Awake, no distress.  CV:  RRR.  Good peripheral perfusion.  Resp:  Normal effort.  CTAB. Abd:  Mild upper abdominal tenderness to palpation without rebound or guarding.  No truncal vesicles.  No distention.  Other:  Baseline facial droop and mental state per facility.   ED Results / Procedures / Treatments  Labs (all labs ordered are listed, but only abnormal results are displayed) Labs Reviewed  CBC WITH DIFFERENTIAL/PLATELET - Abnormal; Notable for the following components:      Result Value   RBC 3.07 (*)    Hemoglobin 10.4 (*)    HCT 31.1 (*)    MCV 101.3 (*)    All other components within normal limits  BASIC METABOLIC PANEL - Abnormal; Notable for the following components:   Sodium 134 (*)    Potassium 3.0 (*)    Glucose, Bld 113 (*)    Creatinine, Ser 1.06 (*)    Calcium 8.4 (*)    GFR, Estimated 57 (*)    All other components within normal limits  HEPATIC FUNCTION PANEL - Abnormal; Notable for the following components:   Albumin 3.3 (*)    All other components within normal limits  CULTURE, BLOOD (ROUTINE X 2)  CULTURE, BLOOD (ROUTINE X 2)  LACTIC ACID, PLASMA  LIPASE, BLOOD     EKG  None   RADIOLOGY I have independently visualized and interpreted patient's ultrasound as well as noted the  radiology interpretation:  Ultrasound: No acute findings  CT head: No ICH  Official radiology report(s): CT Head Wo Contrast  Result Date: 09/08/2022 CLINICAL DATA:  Head trauma EXAM: CT HEAD WITHOUT CONTRAST TECHNIQUE: Contiguous axial images were obtained from the base of the skull through the vertex without intravenous contrast. RADIATION DOSE REDUCTION: This exam was performed according to the departmental dose-optimization program which includes automated exposure control, adjustment of the mA and/or kV according to patient size and/or use of iterative reconstruction technique. COMPARISON:  11/01/2021 FINDINGS: Brain: There is no mass, hemorrhage or extra-axial collection. There is advanced cerebellar atrophy, unchanged. The brain parenchyma is normal, without acute or chronic infarction. Vascular: No abnormal hyperdensity of the major intracranial arteries or dural venous sinuses. No intracranial atherosclerosis. Skull: The visualized skull base, calvarium and extracranial soft tissues are normal. Sinuses/Orbits: No fluid levels or advanced mucosal thickening of the visualized paranasal sinuses. No mastoid or middle ear effusion. The orbits are normal. IMPRESSION: 1. No acute intracranial abnormality. 2. Advanced cerebellar atrophy, unchanged. Electronically Signed   By: Ulyses Jarred M.D.   On: 09/08/2022 02:48   US ABDOMEN LIMITED RUQ (LIVER/GB)  Result Date: 09/08/2022 CLINICAL DATA:  Pain when bending over EXAM: ULTRASOUND ABDOMEN LIMITED RIGHT UPPER QUADRANT COMPARISON:  CT 09/07/2022 FINDINGS: Gallbladder: Prior cholecystectomy Common bile duct: Diameter: Normal caliber, 5 mm Liver: No focal lesion identified. Within normal limits in parenchymal echogenicity. Portal vein is patent on color Doppler imaging with normal direction of blood flow towards the liver. Other: Mild fullness of the right renal  pelvis. IMPRESSION: Prior cholecystectomy. No acute findings. Mild fullness of the right renal  collecting system. Electronically Signed   By: Rolm Baptise M.D.   On: 09/08/2022 01:03   CT Abdomen Pelvis W Contrast  Result Date: 09/07/2022 CLINICAL DATA:  Nonlocalized abdominal pain since yesterday. EXAM: CT ABDOMEN AND PELVIS WITH CONTRAST TECHNIQUE: Multidetector CT imaging of the abdomen and pelvis was performed using the standard protocol following bolus administration of intravenous contrast. RADIATION DOSE REDUCTION: This exam was performed according to the departmental dose-optimization program which includes automated exposure control, adjustment of the mA and/or kV according to patient size and/or use of iterative reconstruction technique. CONTRAST:  178mL OMNIPAQUE IOHEXOL 300 MG/ML  SOLN COMPARISON:  02/28/2021 FINDINGS: Lower chest: Basilar atelectasis.  Large hiatal hernia. Hepatobiliary: No suspicious focal abnormality within the liver parenchyma. Gallbladder is surgically absent. No intrahepatic or extrahepatic biliary dilation. Pancreas: No focal mass lesion. No dilatation of the main duct. No intraparenchymal cyst. No peripancreatic edema. Spleen: No splenomegaly. No focal mass lesion. Adrenals/Urinary Tract: No adrenal nodule or mass. Segmental areas of decreased perfusion are identified in the left kidney, mainly in the interpolar region and lower pole (well seen delayed image 14 of series 7). Right kidney unremarkable. No evidence for hydroureter. The urinary bladder appears normal for the degree of distention. Stomach/Bowel: Large hiatal hernia with greater than 75% of the stomach contained in the chest. Duodenum is normally positioned as is the ligament of Treitz. No small bowel wall thickening. No small bowel dilatation. The terminal ileum is normal. The appendix is not well visualized, but there is no edema or inflammation in the region of the cecum. No gross colonic mass. No colonic wall thickening. Vascular/Lymphatic: There is moderate atherosclerotic calcification of the  abdominal aorta without aneurysm. There is no gastrohepatic or hepatoduodenal ligament lymphadenopathy. No retroperitoneal or mesenteric lymphadenopathy. No pelvic sidewall lymphadenopathy. Reproductive: Unremarkable. Other: No intraperitoneal free fluid. Musculoskeletal: No worrisome lytic or sclerotic osseous abnormality. IMPRESSION: 1. Segmental areas of decreased perfusion in the left kidney, mainly in the interpolar region and lower pole. Imaging features are compatible with pyelonephritis. No evidence for intrarenal abscess. 2. Large hiatal hernia with greater than 75% of the stomach contained in the chest. 3.  Aortic Atherosclerosis (ICD10-I70.0). Electronically Signed   By: Misty Stanley M.D.   On: 09/07/2022 11:21     PROCEDURES:  Critical Care performed: No  Procedures   MEDICATIONS ORDERED IN ED: Medications  famotidine (PEPCID) IVPB 20 mg premix (0 mg Intravenous Stopped 09/08/22 0101)     IMPRESSION / MDM / ASSESSMENT AND PLAN / ED COURSE  I reviewed the triage vital signs and the nursing notes.                             67 year old female who returns to the ED with continued abdominal pain. Differential diagnosis includes, but is not limited to, biliary disease (biliary colic, acute cholecystitis, cholangitis, choledocholithiasis, etc), intrathoracic causes for epigastric abdominal pain including ACS, gastritis, duodenitis, pancreatitis, small bowel or large bowel obstruction, abdominal aortic aneurysm, hernia, and ulcer(s).  I personally reviewed patient's records and note her ED visit from 09/07/2022.  Patient's presentation is most consistent with acute presentation with potential threat to life or bodily function.  The patient is on the cardiac monitor to evaluate for evidence of arrhythmia and/or significant heart rate changes.  Will repeat lab work, add blood cultures and lactic acid.  I personally  reviewed patient's CT report from yesterday and note large hiatal hernia.   Will administer IV Pepcid and obtain right upper quadrant abdominal ultrasound.  Will reassess.  Clinical Course as of 09/08/22 0252  Sat Sep 08, 2022  0207 Ultrasound unremarkable for acute abnormality.  Patient resting in no acute distress.  Review of medicines show she is already on Protonix.  He was previously on Carafate but it was suspended in 2022 due to shortage.  Will restart her on Carafate and patient will follow-up with her PCP closely.  Strict return precautions given.  Patient verbalizes understanding and agrees with plan of care. [JS]  0226 Patient fell while being assisted to the restroom.  Unsure if she struck her head.  Will obtain CT head to evaluate for injury. [JS]  [id number] CT head negative for intracranial hemorrhage. [JS]    Clinical Course User Index [JS] Paulette Blanch, MD     FINAL CLINICAL IMPRESSION(S) / ED DIAGNOSES   Final diagnoses:  Abdominal pain, unspecified abdominal location  Hiatal hernia     Rx / DC Orders   ED Discharge Orders          Ordered    sucralfate (CARAFATE) 1 GM/10ML suspension  4 times daily        09/08/22 0209             Note:  This document was prepared using Dragon voice recognition software and may include unintentional dictation errors.   Paulette Blanch, MD 09/08/22 [phone]

## 2022-09-07 NOTE — ED Triage Notes (Signed)
See first nurse note. Pt reports central abd pain when she bends over. Denies n/v/d. Pt reports she has "arthritis in her belly". Pt seen earlier today for same. Denies change in the pain. Pt oriented to self and place. Breathing unlabored speaking in full sentences with symmetric chest rise and fall.

## 2022-09-07 NOTE — ED Triage Notes (Signed)
First Nurse Note:  BIB [facility] from [place]. Facility called EMS for c/o seizure. Pt and witnesses denies seizure activity. Pt c/o severe abd pain that caused her to lay on the floor. Pt alert and oriented on EMS arrival and not post ictal. Pt seen earlier today for same abd pain. Pt does have hx of seizure. Pt has baseline slight L droop to mouth. Pt has hx of dementia.   CBG 127 104/50 95% RA  HR 74 RR ~18

## 2022-09-07 NOTE — ED Provider Notes (Signed)
[facility] Provider Note   Event Date/Time   First MD Initiated Contact with Patient 09/07/22 [phone]     (approximate) History  Abdominal Pain  HPI Marcia Brown is a 67 y.o. female who presents from the [place] at [place] with a history of epilepsy, recurrent UTI, repeated falls, and COPD who presents via EMS complaining of of midepigastric and right upper quadrant abdominal pain that began this morning.  Patient states that it is worse with sitting up or bending over.  Patient is only oriented to self at baseline.  Patient states she did have a fall this morning resulting in a small skin tear to the right upper extremity which does not have any significant injuries.  Patient denies hitting her head or losing consciousness.  Patient denies any blood thinner use.  Patient is a poor historian and unable to fully describe this pain but only states that it hurts in her upper abdomen. ROS: Patient currently denies any vision changes, tinnitus, difficulty speaking, facial droop, sore throat, chest pain, shortness of breath, nausea/vomiting/diarrhea, dysuria, or weakness/numbness/paresthesias in any extremity   Physical Exam  Triage Vital Signs: ED Triage Vitals  Enc Vitals Group     BP --      Pulse --      Resp --      Temp --      Temp src --      SpO2 09/07/22 0935 98 %     Weight 09/07/22 0939 158 lb (71.7 kg)     Height 09/07/22 0939 5' (1.524 m)     Head Circumference --      Peak Flow --      Pain Score 09/07/22 0938 3     Pain Loc --      Pain Edu? --      Excl. in [place]? --    Most recent vital signs: Vitals:   09/07/22 1330 09/07/22 1331  BP: 100/60   Pulse: (!) 33   Resp: (!) 23   Temp:  98.7 F (37.1 C)  SpO2:     General: Awake, oriented to self CV:  Good peripheral perfusion.  Resp:  Normal effort. Abd:  No distention. Other:  Elderly obese Caucasian female laying in bed in no acute distress ED Results / Procedures / Treatments  Labs (all  labs ordered are listed, but only abnormal results are displayed) Labs Reviewed  COMPREHENSIVE METABOLIC PANEL - Abnormal; Notable for the following components:      Result Value   Sodium 134 (*)    Potassium 3.1 (*)    Glucose, Bld 129 (*)    Calcium 8.7 (*)    All other components within normal limits  CBC WITH DIFFERENTIAL/PLATELET - Abnormal; Notable for the following components:   RBC 3.38 (*)    Hemoglobin 11.5 (*)    HCT 33.5 (*)    All other components within normal limits  URINALYSIS, W/ REFLEX TO CULTURE (INFECTION SUSPECTED) - Abnormal; Notable for the following components:   Color, Urine YELLOW (*)    APPearance TURBID (*)    Hgb urine dipstick MODERATE (*)    Protein, ur 100 (*)    Leukocytes,Ua MODERATE (*)    Bacteria, UA MANY (*)    All other components within normal limits  CULTURE, BLOOD (ROUTINE X 2)  CULTURE, BLOOD (ROUTINE X 2)  URINE CULTURE  LIPASE, BLOOD  TROPONIN I (HIGH SENSITIVITY)  TROPONIN I (HIGH SENSITIVITY)   EKG ED  ECG REPORT I, Naaman Plummer, the attending physician, personally viewed and interpreted this ECG. Date: 09/07/2022 EKG Time: 0938 Rate: 69 Rhythm: normal sinus rhythm QRS Axis: normal Intervals: normal ST/T Wave abnormalities: normal Narrative Interpretation: no evidence of acute ischemia RADIOLOGY ED MD interpretation: CT of the abdomen and pelvis with IV contrast interpreted independently by me and shows segmental areas of decreased perfusion in the left kidney concerning for possible pyelonephritis -Agree with radiology assessment Official radiology report(s): CT Abdomen Pelvis W Contrast  Result Date: 09/07/2022 CLINICAL DATA:  Nonlocalized abdominal pain since yesterday. EXAM: CT ABDOMEN AND PELVIS WITH CONTRAST TECHNIQUE: Multidetector CT imaging of the abdomen and pelvis was performed using the standard protocol following bolus administration of intravenous contrast. RADIATION DOSE REDUCTION: This exam was performed  according to the departmental dose-optimization program which includes automated exposure control, adjustment of the mA and/or kV according to patient size and/or use of iterative reconstruction technique. CONTRAST:  178mL OMNIPAQUE IOHEXOL 300 MG/ML  SOLN COMPARISON:  02/28/2021 FINDINGS: Lower chest: Basilar atelectasis.  Large hiatal hernia. Hepatobiliary: No suspicious focal abnormality within the liver parenchyma. Gallbladder is surgically absent. No intrahepatic or extrahepatic biliary dilation. Pancreas: No focal mass lesion. No dilatation of the main duct. No intraparenchymal cyst. No peripancreatic edema. Spleen: No splenomegaly. No focal mass lesion. Adrenals/Urinary Tract: No adrenal nodule or mass. Segmental areas of decreased perfusion are identified in the left kidney, mainly in the interpolar region and lower pole (well seen delayed image 14 of series 7). Right kidney unremarkable. No evidence for hydroureter. The urinary bladder appears normal for the degree of distention. Stomach/Bowel: Large hiatal hernia with greater than 75% of the stomach contained in the chest. Duodenum is normally positioned as is the ligament of Treitz. No small bowel wall thickening. No small bowel dilatation. The terminal ileum is normal. The appendix is not well visualized, but there is no edema or inflammation in the region of the cecum. No gross colonic mass. No colonic wall thickening. Vascular/Lymphatic: There is moderate atherosclerotic calcification of the abdominal aorta without aneurysm. There is no gastrohepatic or hepatoduodenal ligament lymphadenopathy. No retroperitoneal or mesenteric lymphadenopathy. No pelvic sidewall lymphadenopathy. Reproductive: Unremarkable. Other: No intraperitoneal free fluid. Musculoskeletal: No worrisome lytic or sclerotic osseous abnormality. IMPRESSION: 1. Segmental areas of decreased perfusion in the left kidney, mainly in the interpolar region and lower pole. Imaging features are  compatible with pyelonephritis. No evidence for intrarenal abscess. 2. Large hiatal hernia with greater than 75% of the stomach contained in the chest. 3.  Aortic Atherosclerosis (ICD10-I70.0). Electronically Signed   By: Misty Stanley M.D.   On: 09/07/2022 11:21   PROCEDURES: Critical Care performed: No Procedures MEDICATIONS ORDERED IN ED: Medications  iohexol (OMNIPAQUE) 300 MG/ML solution 100 mL (100 mLs Intravenous Contrast Given 09/07/22 1055)  cefTRIAXone (ROCEPHIN) 1 g in sodium chloride 0.9 % 100 mL IVPB (0 g Intravenous Stopped 09/07/22 1332)   IMPRESSION / MDM / ASSESSMENT AND PLAN / ED COURSE  I reviewed the triage vital signs and the nursing notes.                             The patient is on the cardiac monitor to evaluate for evidence of arrhythmia and/or significant heart rate changes. Patient's presentation is most consistent with acute presentation with potential threat to life or bodily function. Patient is a 67 year old female who presents with abdominal pain over the last 8 hours. Not Pregnant.  Unlikely TOA, Ovarian Torsion, PID, gonorrhea/chlamydia. Low suspicion for Infected Urolithiasis, AAA, Cholecystitis, Pancreatitis, SBO, Appendicitis, or other acute abdomen. Patient is hemodynamically stable and at her mental and functional baseline. Rx: Cefdinir 300 mg BID for 5 days Disposition: Discharge home. SRP discussed. Advise follow up with primary care provider within 24-72 hours.   FINAL CLINICAL IMPRESSION(S) / ED DIAGNOSES   Final diagnoses:  Urinary tract infection with hematuria, site unspecified  Epigastric pain   Rx / DC Orders   ED Discharge Orders          Ordered    cefdinir (OMNICEF) 300 MG capsule  2 times daily        09/07/22 1236           Note:  This document was prepared using Dragon voice recognition software and may include unintentional dictation errors.   Naaman Plummer, MD 09/07/22 [phone]

## 2022-09-07 NOTE — ED Provider Notes (Incomplete)
[facility] Provider Note    Event Date/Time   First MD Initiated Contact with Patient 09/07/22 2311     (approximate)   History   Abdominal Pain   HPI  Marcia Brown is a 67 y.o. female to the ED via EMS from [place].  Patient returns to the ED for continued abdominal pain.  She was seen in the ED approximately 12 hours ago for same.  Complains of central abdominal pain worsened by bending over.  She had an evaluation including CT scan and diagnosed with UTI and placed on antibiotics.  Patient denies any new symptoms since discharge from the emergency department.  Specifically, denies fever/chills, chest pain, shortness of breath, nausea, vomiting or diarrhea.     Past Medical History   Past Medical History:  Diagnosis Date  . Anxiety   . Asthma   . Cerebral ischemia   . COPD (chronic obstructive pulmonary disease) ([place])   . Depression   . High cholesterol   . Polyneuropathy   . Seizures ([place])   . Sleep apnea      Active Problem List   Patient Active Problem List   Diagnosis Date Noted  . Seizures ([place]) 08/04/2019  . Acute lower UTI 06/26/2019  . Hypokalemia 06/26/2019  . AMS (altered mental status) 06/26/2019  . Tobacco abuse 12/27/2015  . Repeated falls 05/24/2015  . Seizure ([place]) 02/03/2015  . 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     Past Surgical History   Past Surgical History:  Procedure Laterality Date  . ESOPHAGEAL DILATION    . FOOT SURGERY Right      Home Medications   Prior to Admission medications   Medication Sig Start Date End Date Taking? Authorizing Provider  albuterol (PROVENTIL HFA;VENTOLIN HFA) 108 (90 BASE) MCG/ACT inhaler Inhale 2 puffs into the lungs every 4 (four) hours as needed for wheezing or shortness of breath.    [provider]  alum & mag hydroxide-simeth (MAALOX/MYLANTA) 200-200-20 MG/5ML suspension Take 30 mLs by mouth every 6 (six) hours as needed for indigestion or heartburn.     [provider]  atorvastatin (LIPITOR) 10 MG tablet Take 10 mg by mouth at bedtime.    [provider]  azelastine (OPTIVAR) 0.05 % ophthalmic solution Apply 1 drop to eye 2 (two) times daily. 04/18/21   [provider]  cefdinir (OMNICEF) 300 MG capsule Take 1 capsule (300 mg total) by mouth 2 (two) times daily for 5 days. 09/07/22 09/12/22  Naaman Plummer, MD  celecoxib (CELEBREX) 100 MG capsule Take 100 mg by mouth 2 (two) times daily. 04/21/21   [provider]  cholecalciferol (VITAMIN D) 1000 units tablet Take 2,000 Units by mouth daily. Give with Vitamin D3 400 units    [provider]  cholecalciferol (VITAMIN D) 400 UNITS TABS tablet Take 400 Units by mouth daily. Give with Vitamin D3 2000 units    [provider]  clonazePAM (KLONOPIN) 0.5 MG tablet Take 0.5 mg by mouth 3 (three) times daily.    [provider]  clopidogrel (PLAVIX) 75 MG tablet Take 75 mg by mouth daily. 06/19/19   [provider]  cyanocobalamin 500 MCG tablet Take 500 mcg by mouth daily.    [provider]  diclofenac sodium (VOLTAREN) 1 % GEL Apply 4 g topically 3 (three) times daily. Apply to both knees    [provider]  diphenhydrAMINE (BENADRYL) 25 MG tablet Take 25 mg by mouth every 6 (  six) hours as needed for allergies.    [provider]  docusate sodium (COLACE) 50 MG capsule Take 50 mg by mouth daily.    [provider]  DULoxetine (CYMBALTA) 60 MG capsule Take 60 mg by mouth daily.    [provider]  ferrous sulfate 325 (65 FE) MG tablet Take 325 mg by mouth 3 (three) times a week.    [provider]  gabapentin (NEURONTIN) 100 MG capsule Take 200 mg by mouth 2 (two) times daily.    [provider]  lamoTRIgine (LAMICTAL) 100 MG tablet Take 1.5 tablets (150 mg total) by mouth 2 (two) times daily. 09/06/20 05/13/21  Lucrezia Starch, MD  levETIRAcetam (KEPPRA) 500 MG tablet Take 1  tablet (500 mg total) by mouth 2 (two) times daily. Take one tablet (500) by mouth daily in the morning and one and one-half tablets (750 mg) in the evening 09/06/20 05/13/21  Lucrezia Starch, MD  loperamide (IMODIUM) 2 MG capsule Take 4 mg by mouth as needed for diarrhea or loose stools.    [provider]  loratadine (CLARITIN) 10 MG tablet Take 10 mg by mouth daily.    [provider]  losartan (COZAAR) 25 MG tablet Take 25 mg by mouth daily.    [provider]  magnesium hydroxide (MILK OF MAGNESIA) 400 MG/5ML suspension Take 30 mLs by mouth daily as needed for mild constipation or moderate constipation.    [provider]  mometasone-formoterol (DULERA) 100-5 MCG/ACT AERO Inhale 2 puffs into the lungs 2 (two) times daily.    [provider]  Multiple Vitamin (MULTIVITAMIN WITH MINERALS) TABS tablet Take 1 tablet by mouth daily.    [provider]  pantoprazole (PROTONIX) 40 MG tablet Take 40 mg by mouth daily before breakfast.     [provider]  phenytoin (DILANTIN) 100 MG ER capsule Take 100 mg by mouth 2 (two) times daily.     [provider]  potassium chloride SA (KLOR-CON M20) 20 MEQ tablet Take 1 tablet (20 mEq total) by mouth daily. 11/28/19   Hinda Kehr, MD  QUEtiapine (SEROQUEL) 25 MG tablet Take 25 mg by mouth at bedtime. 06/19/19   [provider]  rosuvastatin (CRESTOR) 5 MG tablet Take 5 mg by mouth daily.    [provider]  sucralfate (CARAFATE) 1 g tablet Take 1 g by mouth 4 (four) times daily. 06/19/19   [provider]  traMADol-acetaminophen (ULTRACET) 37.5-325 MG tablet Take 1 tablet by mouth every 8 (eight) hours as needed.    [provider]  VIMPAT 150 MG TABS Take 1 tablet by mouth 2 (two) times daily. 06/03/19   [provider]     Allergies  Ciprofloxacin and Aspirin   Family History   Family History  Problem Relation Age of Onset  . Breast  cancer Daughter 45     Physical Exam  Triage Vital Signs: ED Triage Vitals  Enc Vitals Group     BP 09/07/22 2220 100/69     Pulse Rate 09/07/22 2220 68     Resp 09/07/22 2220 18     Temp 09/07/22 2220 98.9 F (37.2 C)     Temp Source 09/07/22 2220 Oral     SpO2 09/07/22 2220 100 %     Weight 09/07/22 2219 158 lb 1.1 oz (71.7 kg)     Height 09/07/22 2219 5' (1.524 m)     Head Circumference --  Peak Flow --      Pain Score 09/07/22 2219 7     Pain Loc --      Pain Edu? --      Excl. in [place]? --     Updated Vital Signs: BP 100/69 (BP Location: Left Arm)   Pulse 68   Temp 98.9 F (37.2 C) (Oral)   Resp 18   Ht 5' (1.524 m)   Wt 71.7 kg   SpO2 100%   BMI 30.87 kg/m    General: Awake, no distress.  CV:  RRR.  Good peripheral perfusion.  Resp:  Normal effort.  CTAB. Abd:  Mild upper abdominal tenderness to palpation without rebound or guarding.  No truncal vesicles.  No distention.  Other:  Baseline facial droop and mental state per facility.   ED Results / Procedures / Treatments  Labs (all labs ordered are listed, but only abnormal results are displayed) Labs Reviewed  CULTURE, BLOOD (ROUTINE X 2)  CULTURE, BLOOD (ROUTINE X 2)  LACTIC ACID, PLASMA  LACTIC ACID, PLASMA  CBC WITH DIFFERENTIAL/PLATELET  BASIC METABOLIC PANEL     EKG  None   RADIOLOGY I have independently visualized and interpreted patient's ultrasound as well as noted the radiology interpretation:  Ultrasound:  Official radiology report(s): CT Abdomen Pelvis W Contrast  Result Date: 09/07/2022 CLINICAL DATA:  Nonlocalized abdominal pain since yesterday. EXAM: CT ABDOMEN AND PELVIS WITH CONTRAST TECHNIQUE: Multidetector CT imaging of the abdomen and pelvis was performed using the standard protocol following bolus administration of intravenous contrast. RADIATION DOSE REDUCTION: This exam was performed according to the departmental dose-optimization program which includes automated  exposure control, adjustment of the mA and/or kV according to patient size and/or use of iterative reconstruction technique. CONTRAST:  132mL OMNIPAQUE IOHEXOL 300 MG/ML  SOLN COMPARISON:  02/28/2021 FINDINGS: Lower chest: Basilar atelectasis.  Large hiatal hernia. Hepatobiliary: No suspicious focal abnormality within the liver parenchyma. Gallbladder is surgically absent. No intrahepatic or extrahepatic biliary dilation. Pancreas: No focal mass lesion. No dilatation of the main duct. No intraparenchymal cyst. No peripancreatic edema. Spleen: No splenomegaly. No focal mass lesion. Adrenals/Urinary Tract: No adrenal nodule or mass. Segmental areas of decreased perfusion are identified in the left kidney, mainly in the interpolar region and lower pole (well seen delayed image 14 of series 7). Right kidney unremarkable. No evidence for hydroureter. The urinary bladder appears normal for the degree of distention. Stomach/Bowel: Large hiatal hernia with greater than 75% of the stomach contained in the chest. Duodenum is normally positioned as is the ligament of Treitz. No small bowel wall thickening. No small bowel dilatation. The terminal ileum is normal. The appendix is not well visualized, but there is no edema or inflammation in the region of the cecum. No gross colonic mass. No colonic wall thickening. Vascular/Lymphatic: There is moderate atherosclerotic calcification of the abdominal aorta without aneurysm. There is no gastrohepatic or hepatoduodenal ligament lymphadenopathy. No retroperitoneal or mesenteric lymphadenopathy. No pelvic sidewall lymphadenopathy. Reproductive: Unremarkable. Other: No intraperitoneal free fluid. Musculoskeletal: No worrisome lytic or sclerotic osseous abnormality. IMPRESSION: 1. Segmental areas of decreased perfusion in the left kidney, mainly in the interpolar region and lower pole. Imaging features are compatible with pyelonephritis. No evidence for intrarenal abscess. 2. Large  hiatal hernia with greater than 75% of the stomach contained in the chest. 3.  Aortic Atherosclerosis (ICD10-I70.0). Electronically Signed   By: Misty Stanley M.D.   On: 09/07/2022 11:21     PROCEDURES:  Critical Care performed: {CriticalCareYesNo:19197::"Yes,  see critical care procedure note(s)","No"}  Procedures   MEDICATIONS ORDERED IN ED: Medications - No data to display   IMPRESSION / MDM / [place] / ED COURSE  I reviewed the triage vital signs and the nursing notes.                             67 year old female who returns to the ED with continued abdominal pain. Differential diagnosis includes, but is not limited to, biliary disease (biliary colic, acute cholecystitis, cholangitis, choledocholithiasis, etc), intrathoracic causes for epigastric abdominal pain including ACS, gastritis, duodenitis, pancreatitis, small bowel or large bowel obstruction, abdominal aortic aneurysm, hernia, and ulcer(s).  I personally reviewed patient's records and note her ED visit from 09/07/2022.  Patient's presentation is most consistent with acute presentation with potential threat to life or bodily function.  The patient is on the cardiac monitor to evaluate for evidence of arrhythmia and/or significant heart rate changes.  Will repeat lab work, add blood cultures and lactic acid.  I personally reviewed patient's CT report from yesterday and note large hiatal hernia.  Will administer IV Pepcid and obtain right upper quadrant abdominal ultrasound.  Will reassess.      FINAL CLINICAL IMPRESSION(S) / ED DIAGNOSES   Final diagnoses:  Abdominal pain, unspecified abdominal location     Rx / DC Orders   ED Discharge Orders     None        Note:  This document was prepared using Dragon voice recognition software and may include unintentional dictation errors.

## 2022-09-08 ENCOUNTER — Emergency Department: Payer: Medicare Other

## 2022-09-08 ENCOUNTER — Other Ambulatory Visit: Payer: Self-pay

## 2022-09-08 ENCOUNTER — Telehealth: Payer: Self-pay

## 2022-09-08 ENCOUNTER — Emergency Department
Admission: EM | Admit: 2022-09-08 | Discharge: 2022-09-08 | Disposition: A | Discharge status: Skilled Nursing Facility | Payer: Medicare Other | Attending: Emergency Medicine | Admitting: Emergency Medicine

## 2022-09-08 ENCOUNTER — Encounter: Payer: Self-pay | Admitting: Emergency Medicine

## 2022-09-08 DIAGNOSIS — R1010 Upper abdominal pain, unspecified: Secondary | ICD-10-CM | POA: Insufficient documentation

## 2022-09-08 DIAGNOSIS — R1013 Epigastric pain: Secondary | ICD-10-CM | POA: Diagnosis not present

## 2022-09-08 DIAGNOSIS — Z20822 Contact with and (suspected) exposure to covid-19: Secondary | ICD-10-CM | POA: Insufficient documentation

## 2022-09-08 DIAGNOSIS — Y92129 Unspecified place in nursing home as the place of occurrence of the external cause: Secondary | ICD-10-CM | POA: Insufficient documentation

## 2022-09-08 DIAGNOSIS — R7881 Bacteremia: Secondary | ICD-10-CM

## 2022-09-08 DIAGNOSIS — R531 Weakness: Secondary | ICD-10-CM | POA: Insufficient documentation

## 2022-09-08 DIAGNOSIS — W19XXXA Unspecified fall, initial encounter: Secondary | ICD-10-CM | POA: Insufficient documentation

## 2022-09-08 LAB — BASIC METABOLIC PANEL
Anion gap: 10 (ref 5–15)
BUN: 18 mg/dL (ref 8–23)
CO2: 23 mmol/L (ref 22–32)
Calcium: 8.4 mg/dL — ABNORMAL LOW (ref 8.9–10.3)
Chloride: 101 mmol/L (ref 98–111)
Creatinine, Ser: 1.06 mg/dL — ABNORMAL HIGH (ref 0.44–1.00)
GFR, Estimated: 57 mL/min — ABNORMAL LOW (ref >60)
Glucose, Bld: 113 mg/dL — ABNORMAL HIGH (ref 70–99)
Potassium: 3 mmol/L — ABNORMAL LOW (ref 3.5–5.1)
Sodium: 134 mmol/L — ABNORMAL LOW (ref 135–145)

## 2022-09-08 LAB — CBC WITH DIFFERENTIAL/PLATELET
Abs Immature Granulocytes: 0.02 10*3/uL (ref 0.00–0.07)
Abs Immature Granulocytes: 0.02 10*3/uL (ref 0.00–0.07)
Basophils Absolute: 0 10*3/uL (ref 0.0–0.1)
Basophils Absolute: 0 10*3/uL (ref 0.0–0.1)
Basophils Relative: 0 %
Basophils Relative: 0 %
Eosinophils Absolute: 0 10*3/uL (ref 0.0–0.5)
Eosinophils Absolute: 0.1 10*3/uL (ref 0.0–0.5)
Eosinophils Relative: 0 %
Eosinophils Relative: 1 %
HCT: 31.1 % — ABNORMAL LOW (ref 36.0–46.0)
HCT: 31.1 % — ABNORMAL LOW (ref 36.0–46.0)
Hemoglobin: 10.3 g/dL — ABNORMAL LOW (ref 12.0–15.0)
Hemoglobin: 10.4 g/dL — ABNORMAL LOW (ref 12.0–15.0)
Immature Granulocytes: 0 %
Immature Granulocytes: 0 %
Lymphocytes Relative: 11 %
Lymphocytes Relative: 15 %
Lymphs Abs: 0.8 10*3/uL (ref 0.7–4.0)
Lymphs Abs: 0.9 10*3/uL (ref 0.7–4.0)
MCH: 33.8 pg (ref 26.0–34.0)
MCH: 33.9 pg (ref 26.0–34.0)
MCHC: 33.1 g/dL (ref 30.0–36.0)
MCHC: 33.4 g/dL (ref 30.0–36.0)
MCV: 101.3 fL — ABNORMAL HIGH (ref 80.0–100.0)
MCV: 102 fL — ABNORMAL HIGH (ref 80.0–100.0)
Monocytes Absolute: 0.9 10*3/uL (ref 0.1–1.0)
Monocytes Absolute: 0.9 10*3/uL (ref 0.1–1.0)
Monocytes Relative: 13 %
Monocytes Relative: 15 %
Neutro Abs: 4.2 10*3/uL (ref 1.7–7.7)
Neutro Abs: 5.3 10*3/uL (ref 1.7–7.7)
Neutrophils Relative %: 69 %
Neutrophils Relative %: 76 %
Platelets: 158 10*3/uL (ref 150–400)
Platelets: 166 10*3/uL (ref 150–400)
RBC: 3.05 MIL/uL — ABNORMAL LOW (ref 3.87–5.11)
RBC: 3.07 MIL/uL — ABNORMAL LOW (ref 3.87–5.11)
RDW: 12.4 % (ref 11.5–15.5)
RDW: 12.5 % (ref 11.5–15.5)
WBC: 6 10*3/uL (ref 4.0–10.5)
WBC: 7.1 10*3/uL (ref 4.0–10.5)
nRBC: 0 % (ref 0.0–0.2)
nRBC: 0 % (ref 0.0–0.2)

## 2022-09-08 LAB — LACTIC ACID, PLASMA
Lactic Acid, Venous: 0.8 mmol/L (ref 0.5–1.9)
Lactic Acid, Venous: 0.8 mmol/L (ref 0.5–1.9)

## 2022-09-08 LAB — COMPREHENSIVE METABOLIC PANEL
ALT: 19 U/L (ref 0–44)
AST: 20 U/L (ref 15–41)
Albumin: 3.3 g/dL — ABNORMAL LOW (ref 3.5–5.0)
Alkaline Phosphatase: 67 U/L (ref 38–126)
Anion gap: 9 (ref 5–15)
BUN: 20 mg/dL (ref 8–23)
CO2: 23 mmol/L (ref 22–32)
Calcium: 8.7 mg/dL — ABNORMAL LOW (ref 8.9–10.3)
Chloride: 104 mmol/L (ref 98–111)
Creatinine, Ser: 1.01 mg/dL — ABNORMAL HIGH (ref 0.44–1.00)
GFR, Estimated: >60 mL/min (ref >60)
Glucose, Bld: 93 mg/dL (ref 70–99)
Potassium: 3.2 mmol/L — ABNORMAL LOW (ref 3.5–5.1)
Sodium: 136 mmol/L (ref 135–145)
Total Bilirubin: 0.6 mg/dL (ref 0.3–1.2)
Total Protein: 6.6 g/dL (ref 6.5–8.1)

## 2022-09-08 LAB — HEPATIC FUNCTION PANEL
ALT: 20 U/L (ref 0–44)
AST: 23 U/L (ref 15–41)
Albumin: 3.3 g/dL — ABNORMAL LOW (ref 3.5–5.0)
Alkaline Phosphatase: 72 U/L (ref 38–126)
Bilirubin, Direct: 0.2 mg/dL (ref 0.0–0.2)
Indirect Bilirubin: 0.4 mg/dL (ref 0.3–0.9)
Total Bilirubin: 0.6 mg/dL (ref 0.3–1.2)
Total Protein: 6.8 g/dL (ref 6.5–8.1)

## 2022-09-08 LAB — APTT: aPTT: 38 seconds — ABNORMAL HIGH (ref 24–36)

## 2022-09-08 LAB — PROTIME-INR
INR: 1.2 (ref 0.8–1.2)
Prothrombin Time: 14.9 seconds (ref 11.4–15.2)

## 2022-09-08 LAB — RESP PANEL BY RT-PCR (RSV, FLU A&B, COVID)  RVPGX2
Influenza A by PCR: NEGATIVE
Influenza B by PCR: NEGATIVE
Resp Syncytial Virus by PCR: NEGATIVE
SARS Coronavirus 2 by RT PCR: NEGATIVE

## 2022-09-08 LAB — LIPASE, BLOOD: Lipase: 22 U/L (ref 11–51)

## 2022-09-08 MED ORDER — SUCRALFATE 1 GM/10ML PO SUSP: 1.0000 g | Freq: Four times a day (QID) | ORAL | 1 refills | Status: DC

## 2022-09-08 MED ORDER — SODIUM CHLORIDE 0.9 % IV BOLUS (SEPSIS)
1000.0000 mL | Freq: Once | INTRAVENOUS | Status: DC
  Administered 2022-09-08: 1000 mL via INTRAVENOUS

## 2022-09-08 MED ORDER — FAMOTIDINE IN NACL 20-0.9 MG/50ML-% IV SOLN
20.0000 mg | Freq: Once | INTRAVENOUS | Status: AC
  Administered 2022-09-08: 20 mg via INTRAVENOUS
  Filled 2022-09-08: qty 50

## 2022-09-08 MED ORDER — LACTATED RINGERS IV SOLN: INTRAVENOUS | Status: DC

## 2022-09-08 MED ORDER — SODIUM CHLORIDE 0.9 % IV SOLN
2.0000 g | Freq: Once | INTRAVENOUS | Status: DC
  Administered 2022-09-08: 2 g via INTRAVENOUS
  Filled 2022-09-08: qty 12.5

## 2022-09-08 NOTE — ED Triage Notes (Addendum)
Pt ems from the [facility] s/p fall. Pt was here yesterday for same and had 1 positive blood culture out of 4. Pt states she slipped off bed while trying to reach something. Pt c/o bilateral hip pain. No obvious injury.

## 2022-09-08 NOTE — ED Notes (Signed)
Called [facility] for transport back to facility  

## 2022-09-08 NOTE — ED Notes (Signed)
Report given to [place] with [facility]

## 2022-09-08 NOTE — ED Notes (Signed)
EDP sung at bedside to assess patient. This RN to bedside due to staff assist alarm going off. Crystal, RN on knee on floor assisting patient to pull her pants up from using the bathroom. Per Crystal, RN pt was using the bathroom and started to stand to pull her pants up, and fell forward, RN attempted to stop fall and pt fell forward into RN knocking RN and patient to ground. Pt immediately able to stand after fall, assisted back to bed by [employer], RN. EDP sung to bedside to assess patient. VSS, no obvious injury on assessment.

## 2022-09-08 NOTE — ED Notes (Signed)
Called [facility] for transport back to [facility]

## 2022-09-08 NOTE — Consult Note (Signed)
CODE SEPSIS - PHARMACY COMMUNICATION  **Broad Spectrum Antibiotics should be administered within 1 hour of Sepsis diagnosis**  Time Code Sepsis Called/Page Received: 1404  Antibiotics Ordered: Cefepime  Time of 1st antibiotic administration: 1443  Additional action taken by pharmacy: none  If necessary, Name of Provider/Nurse Contacted: n/a    Samauri Kellenberger Rodriguez-Guzman PharmD, BCPS 09/08/2022 2:04 PM

## 2022-09-08 NOTE — Discharge Instructions (Signed)
Restart Carafate 3 times daily with meals and at bedtime.  Return to the ER for worsening symptoms, persistent vomiting, difficulty breathing or other concerns.

## 2022-09-08 NOTE — Consult Note (Signed)
PHARMACY -  BRIEF ANTIBIOTIC NOTE   Pharmacy has received consult(s) for Cefepime from an ED provider.  The patient's profile has been reviewed for ht/wt/allergies/indication/available labs.    One time order(s) placed for : cefepime 2gm   Further antibiotics/pharmacy consults should be ordered by admitting physician if indicated.                       Thank you, Salli Bodin Rodriguez-Guzman PharmD, BCPS 09/08/2022 2:05 PM

## 2022-09-08 NOTE — Sepsis Progress Note (Signed)
Elink following code sepsis °

## 2022-09-08 NOTE — ED Notes (Signed)
Attempted to contact staff at [facility] to give transfer report.  Unable to reach staff after multiple attempts to all nursing units at facility.  Discharge plan discussed with patient and EMS transporting back to facility.   Discharged with [facility] and signed paper script.

## 2022-09-08 NOTE — Sepsis Progress Note (Signed)
Code Sepsis monitoring discontinued due to discharge.  

## 2022-09-08 NOTE — ED Provider Notes (Signed)
[facility] Provider Note   Event Date/Time   First MD Initiated Contact with Patient 09/08/22 1232     (approximate) History  Fall and Abnormal Lab  HPI Marcia Brown is a 67 y.o. female who presents after being told that she had a positive blood culture after being seen yesterday for upper abdominal pain.  Patient states she is still having intermittent weakness in her lower extremities and having frequent falls in her facility however this is not new for her over the past few days.  Patient does have a history of recurrent urinary tract infections and is currently on cefdinir for a Klebsiella UTI that was found.  Patient denies any new complaints such as fever, orthostatic lightheadedness, dyspnea on exertion, or bleeding. ROS: Patient currently denies any vision changes, tinnitus, difficulty speaking, facial droop, sore throat, chest pain, shortness of breath, abdominal pain, nausea/vomiting/diarrhea, dysuria, or weakness/numbness/paresthesias in any extremity   Physical Exam  Triage Vital Signs: ED Triage Vitals  Enc Vitals Group     BP 09/08/22 1229 97/69     Pulse Rate 09/08/22 1229 (!) 59     Resp 09/08/22 1229 18     Temp 09/08/22 1229 99.2 F (37.3 C)     Temp Source 09/08/22 1229 Oral     SpO2 09/08/22 1229 94 %     Weight 09/08/22 1232 158 lb 1.1 oz (71.7 kg)     Height 09/08/22 1232 5' (1.524 m)     Head Circumference --      Peak Flow --      Pain Score 09/08/22 1231 0     Pain Loc --      Pain Edu? --      Excl. in [place]? --    Most recent vital signs: Vitals:   09/08/22 1229  BP: 97/69  Pulse: (!) 59  Resp: 18  Temp: 99.2 F (37.3 C)  SpO2: 94%   General: Awake, oriented x4. CV:  Good peripheral perfusion.  Resp:  Normal effort.  Abd:  No distention.  Other:  Obese man elderly Caucasian female laying in bed in no acute distress ED Results / Procedures / Treatments  Labs (all labs ordered are listed, but only abnormal results are  displayed) Labs Reviewed  CBC WITH DIFFERENTIAL/PLATELET - Abnormal; Notable for the following components:      Result Value   RBC 3.05 (*)    Hemoglobin 10.3 (*)    HCT 31.1 (*)    MCV 102.0 (*)    All other components within normal limits  COMPREHENSIVE METABOLIC PANEL - Abnormal; Notable for the following components:   Potassium 3.2 (*)    Creatinine, Ser 1.01 (*)    Calcium 8.7 (*)    Albumin 3.3 (*)    All other components within normal limits  APTT - Abnormal; Notable for the following components:   aPTT 38 (*)    All other components within normal limits  RESP PANEL BY RT-PCR (RSV, FLU A&B, COVID)  [id number]  CULTURE, BLOOD (ROUTINE X 2)  CULTURE, BLOOD (ROUTINE X 2)  LACTIC ACID, PLASMA  PROTIME-INR   PROCEDURES: Critical Care performed: No Procedures MEDICATIONS ORDERED IN ED: Medications - No data to display IMPRESSION / MDM / [place] / ED COURSE  I reviewed the triage vital signs and the nursing notes.  The patient is on the cardiac monitor to evaluate for evidence of arrhythmia and/or significant heart rate changes. Patient's presentation is most consistent with acute presentation with potential threat to life or bodily function. Patient is a 67 year old female who presents for the second in a row after being called for a positive blood culture that was drawn yesterday.  Blood cultures positive for gram-positive rods.  Given that patient does have a urinary tract infection, this is likely a contaminant.  Patient has no new symptoms at this time, is afebrile, and has no leukocytosis concerning for bacteremia at this time.  Patient is stable for discharge to her skilled nursing facility and follow-up with primary care physician if symptoms worsen.  Dispo: Discharged to long-term care facility   FINAL CLINICAL IMPRESSION(S) / ED DIAGNOSES   Final diagnoses:  Fall, initial encounter  Blood bacterial culture positive   Rx / DC  Orders   ED Discharge Orders     None      Note:  This document was prepared using Dragon voice recognition software and may include unintentional dictation errors.   Naaman Plummer, MD 09/08/22 [phone]

## 2022-09-09 LAB — CULTURE, BLOOD (ROUTINE X 2): Special Requests: ADEQUATE

## 2022-09-11 LAB — URINE CULTURE: Culture: >=100000 — AB

## 2022-09-13 LAB — CULTURE, BLOOD (ROUTINE X 2)
Culture: NO GROWTH
Culture: NO GROWTH
Culture: NO GROWTH
Special Requests: ADEQUATE
Special Requests: ADEQUATE

## 2022-10-03 ENCOUNTER — Emergency Department: Payer: Medicare Other

## 2022-10-03 ENCOUNTER — Emergency Department
Admission: EM | Admit: 2022-10-03 | Discharge: 2022-10-03 | Disposition: A | Discharge status: Home / Self Care | Payer: Medicare Other | Attending: Emergency Medicine | Admitting: Emergency Medicine

## 2022-10-03 ENCOUNTER — Other Ambulatory Visit: Payer: Self-pay

## 2022-10-03 ENCOUNTER — Encounter: Payer: Self-pay | Admitting: Emergency Medicine

## 2022-10-03 DIAGNOSIS — R001 Bradycardia, unspecified: Secondary | ICD-10-CM | POA: Diagnosis not present

## 2022-10-03 DIAGNOSIS — R42 Dizziness and giddiness: Secondary | ICD-10-CM | POA: Insufficient documentation

## 2022-10-03 DIAGNOSIS — J449 Chronic obstructive pulmonary disease, unspecified: Secondary | ICD-10-CM | POA: Diagnosis not present

## 2022-10-03 LAB — URINALYSIS, ROUTINE W REFLEX MICROSCOPIC
Bilirubin Urine: NEGATIVE
Glucose, UA: NEGATIVE mg/dL
Hgb urine dipstick: NEGATIVE
Ketones, ur: NEGATIVE mg/dL
Nitrite: NEGATIVE
Protein, ur: NEGATIVE mg/dL
Specific Gravity, Urine: 1.01 (ref 1.005–1.030)
pH: 7 (ref 5.0–8.0)

## 2022-10-03 LAB — BASIC METABOLIC PANEL
Anion gap: 6 (ref 5–15)
BUN: 13 mg/dL (ref 8–23)
CO2: 26 mmol/L (ref 22–32)
Calcium: 8.7 mg/dL — ABNORMAL LOW (ref 8.9–10.3)
Chloride: 105 mmol/L (ref 98–111)
Creatinine, Ser: 0.8 mg/dL (ref 0.44–1.00)
GFR, Estimated: >60 mL/min (ref >60)
Glucose, Bld: 88 mg/dL (ref 70–99)
Potassium: 4.3 mmol/L (ref 3.5–5.1)
Sodium: 137 mmol/L (ref 135–145)

## 2022-10-03 LAB — CBC
HCT: 36.3 % (ref 36.0–46.0)
Hemoglobin: 12 g/dL (ref 12.0–15.0)
MCH: 33.5 pg (ref 26.0–34.0)
MCHC: 33.1 g/dL (ref 30.0–36.0)
MCV: 101.4 fL — ABNORMAL HIGH (ref 80.0–100.0)
Platelets: 181 10*3/uL (ref 150–400)
RBC: 3.58 MIL/uL — ABNORMAL LOW (ref 3.87–5.11)
RDW: 13.2 % (ref 11.5–15.5)
WBC: 3.6 10*3/uL — ABNORMAL LOW (ref 4.0–10.5)
nRBC: 0 % (ref 0.0–0.2)

## 2022-10-03 LAB — CBG MONITORING, ED: Glucose-Capillary: 79 mg/dL (ref 70–99)

## 2022-10-03 NOTE — ED Notes (Signed)
Report according to day shift, has been given to [facility] and the EMS transport team

## 2022-10-03 NOTE — Discharge Instructions (Addendum)
D/c instructions given to appropriate/responsible party with good understanding

## 2022-10-03 NOTE — ED Notes (Signed)
[facility] called for transport to the [facility]  

## 2022-10-03 NOTE — ED Provider Notes (Signed)
Emergency department handoff note  Care of this patient was signed out to me at the end of the previous provider shift.  All pertinent patient information was conveyed and all questions were answered.  Patient pending MRI results that did not show any evidence of acute abnormalities.  Patient does have chronic changes from previous stroke that was discussed prior to discharge.  The patient has been reexamined and is ready to be discharged.  All diagnostic results have been reviewed and discussed with the patient/family.  Care plan has been outlined and the patient/family understands all current diagnoses, results, and treatment plans.  There are no new complaints, changes, or physical findings at this time.  All questions have been addressed and answered.  Patient was instructed to, and agrees to follow-up with their primary care physician as well as return to the emergency department if any new or worsening symptoms develop.   Merwyn Katos, MD 10/03/22 [phone]

## 2022-10-03 NOTE — ED Provider Notes (Signed)
[facility] Provider Note    Event Date/Time   First MD Initiated Contact with Patient 10/03/22 1329     (approximate)  History   Chief Complaint: Dizziness  HPI  Marcia Brown is a 67 y.o. female with a past medical history anxiety, COPD, prior CVA, seizure disorder, hyperlipidemia, presents to the emergency department for dizziness.  According to the patient since this morning she has intermittently been feeling dizzy which she describes as feeling off balance.  Patient denies any weakness or numbness of any arm or leg confusion or difficulty speaking.  Denies any headache.  Denies any fever cough congestion or recent illness.  No chest pain or shortness of breath.  Patient states she is actually feeling much better now and is not feeling dizzy currently.  Physical Exam   Triage Vital Signs: ED Triage Vitals  Enc Vitals Group     BP 10/03/22 1222 127/81     Pulse Rate 10/03/22 1222 (!) 54     Resp 10/03/22 1222 17     Temp 10/03/22 1222 98.6 F (37 C)     Temp Source 10/03/22 1222 Oral     SpO2 10/03/22 1222 98 %     Weight 10/03/22 1224 152 lb (68.9 kg)     Height 10/03/22 1224 5' (1.524 m)     Head Circumference --      Peak Flow --      Pain Score 10/03/22 1224 0     Pain Loc --      Pain Edu? --      Excl. in [place]? --     Most recent vital signs: Vitals:   10/03/22 1222  BP: 127/81  Pulse: (!) 54  Resp: 17  Temp: 98.6 F (37 C)  SpO2: 98%    General: Awake, no distress.  CV:  Good peripheral perfusion.  Regular rate and rhythm  Resp:  Normal effort.  Equal breath sounds bilaterally.  Abd:  No distention.  Soft, nontender.  No rebound or guarding.  ED Results / Procedures / Treatments   EKG  EKG viewed and interpreted by myself shows sinus bradycardia 52 bpm with a narrow QRS, normal axis, normal intervals, no concerning ST changes.  RADIOLOGY  MRI pending   MEDICATIONS ORDERED IN ED: Medications - No data to  display   IMPRESSION / MDM / ASSESSMENT AND PLAN / ED COURSE  I reviewed the triage vital signs and the nursing notes.  Patient's presentation is most consistent with acute presentation with potential threat to life or bodily function.  Patient presents emergency department for dizziness intermittent since this morning.  Patient states a history of a CVA previously with similar symptoms in the past.  She also has a seizure disorder although denies any recent seizures.  Patient is on many different medications, dizziness could possibly be polypharmacy induced.  Patient CBC is reassuring showing no concerning findings, chemistry reassuring.  Awaiting urinalysis results to ensure no UTI.  As the patient is feeling better with a reassuring physical exam reassuring workup thus far I believe the patient could be safely discharged home if her MRI and urinalysis showed no concerning findings.  We would have the patient follow-up with her neurologist for further evaluation as well as to review her current medication regimen.  Patient is agreeable this plan as well.  Follows up with Dr. Malvin Johns.  Patient care signed out to oncoming provider.  FINAL CLINICAL IMPRESSION(S) / ED DIAGNOSES  Dizziness    Note:  This document was prepared using Dragon voice recognition software and may include unintentional dictation errors.   Minna Antis, MD 10/03/22 1423

## 2022-10-03 NOTE — ED Notes (Signed)
Pt currently on phone being screened by MRI

## 2022-10-03 NOTE — ED Triage Notes (Signed)
Pt via [facility] from [facility] [address] . Pt c/o dizziness since yesterday. Denies any weakness. Pt has R sided facial droop at baseline. Denies pain. States she does take blood thinners. Denies any falls or injuries. Pt is A&Ox4 and NAD

## 2022-10-03 NOTE — ED Notes (Signed)
Called [facility] [place], spoke with [facility] and informed her that the pt is being discharged back to her facility.

## 2022-10-04 ENCOUNTER — Emergency Department
Admission: EM | Admit: 2022-10-04 | Discharge: 2022-10-04 | Disposition: A | Discharge status: Home / Self Care | Payer: Medicare Other | Attending: Emergency Medicine | Admitting: Emergency Medicine

## 2022-10-04 ENCOUNTER — Other Ambulatory Visit: Payer: Self-pay

## 2022-10-04 DIAGNOSIS — Z8673 Personal history of transient ischemic attack (TIA), and cerebral infarction without residual deficits: Secondary | ICD-10-CM | POA: Insufficient documentation

## 2022-10-04 DIAGNOSIS — E860 Dehydration: Secondary | ICD-10-CM | POA: Diagnosis not present

## 2022-10-04 DIAGNOSIS — R531 Weakness: Secondary | ICD-10-CM | POA: Insufficient documentation

## 2022-10-04 DIAGNOSIS — J449 Chronic obstructive pulmonary disease, unspecified: Secondary | ICD-10-CM | POA: Diagnosis not present

## 2022-10-04 DIAGNOSIS — F0390 Unspecified dementia without behavioral disturbance: Secondary | ICD-10-CM | POA: Insufficient documentation

## 2022-10-04 NOTE — ED Notes (Signed)
Called [facility] to arrange transport back to the [facility] waiting on [facility] to arrive

## 2022-10-04 NOTE — ED Notes (Signed)
Assumed care of pt

## 2022-10-04 NOTE — ED Provider Notes (Signed)
   [facility] Provider Note    Event Date/Time   First MD Initiated Contact with Patient 10/04/22 1506     (approximate)   History   Weakness   HPI  Marcia Brown is a 67 y.o. female with a history of COPD, anxiety, stroke, dementia who was sent to the ED from her skilled nursing facility due to a potential fall.  Patient reports that earlier today she was trying to walk, forgot to use her walker, and her legs felt too weak to hold her up and so she fell down to the floor.  She denies head injury or loss of consciousness.  Was able to lowered herself somewhat gently.  Denies any acute pain.  No chest pain shortness of breath or palpitations.     Physical Exam   Triage Vital Signs: ED Triage Vitals  Enc Vitals Group     BP 10/04/22 1129 134/75     Pulse Rate 10/04/22 1129 (!) 55     Resp 10/04/22 1129 16     Temp 10/04/22 1129 98.3 F (36.8 C)     Temp Source 10/04/22 1129 Oral     SpO2 10/04/22 1129 96 %     Weight --      Height --      Head Circumference --      Peak Flow --      Pain Score 10/04/22 1130 0     Pain Loc --      Pain Edu? --      Excl. in GC? --     Most recent vital signs: Vitals:   10/04/22 1129  BP: 134/75  Pulse: (!) 55  Resp: 16  Temp: 98.3 F (36.8 C)  SpO2: 96%    General: Awake, no distress.  CV:  Good peripheral perfusion.  Regular rate and rhythm Resp:  Normal effort.  Clear to auscultation bilaterally Abd:  No distention.  Soft nontender Other:  Dry oral mucosa.  No spinal tenderness  ED Results / Procedures / Treatments   Labs (all labs ordered are listed, but only abnormal results are displayed) Labs Reviewed - No data to display   RADIOLOGY    PROCEDURES:  Procedures   MEDICATIONS ORDERED IN ED: Medications - No data to display   IMPRESSION / MDM / ASSESSMENT AND PLAN / ED COURSE  I reviewed the triage vital signs and the nursing notes.                              Differential  diagnosis includes, but is not limited to, chronic ambulatory dysfunction, dehydration.  Doubt anemia, AKI, electrolyte abnormality, ACS, AAA, infection    ----------------------------------------- 4:36 PM on 10/04/2022 ----------------------------------------- Patient presents after likely mechanical fall.  She was evaluated in the ED yesterday and had labs at that time which were all okay.  She currently has no symptoms, and exam is normal without signs of trauma.  No repeat workup necessary today, stable for discharge     FINAL CLINICAL IMPRESSION(S) / ED DIAGNOSES   Final diagnoses:  Generalized weakness  Dehydration     Rx / DC Orders   ED Discharge Orders     None        Note:  This document was prepared using Dragon voice recognition software and may include unintentional dictation errors.   Sharman Cheek, MD 10/04/22 [phone]

## 2022-10-04 NOTE — ED Triage Notes (Addendum)
Pt to ED via [facility] from [facility] [place]. Staff reports when pt stands her knees buckle and feels dizzy. Pt was was seen on 4/24 for same and reports symptoms have no changed.   No repeat work up needed at this time per MD Sidney Ace

## 2022-10-04 NOTE — ED Notes (Signed)
Canceled EMS transport per Selena Batten, [employer]ity fundraiser

## 2022-11-11 ENCOUNTER — Emergency Department: Payer: Medicare Other

## 2022-11-11 ENCOUNTER — Other Ambulatory Visit: Payer: Self-pay

## 2022-11-11 ENCOUNTER — Inpatient Hospital Stay
Admission: EM | Admit: 2022-11-11 | Discharge: 2022-11-15 | DRG: 511 | Disposition: A | Discharge status: Nursing Home (Includes ICF) | Payer: Medicare Other | Source: Skilled Nursing Facility | Attending: Internal Medicine | Admitting: Internal Medicine

## 2022-11-11 DIAGNOSIS — R42 Dizziness and giddiness: Secondary | ICD-10-CM | POA: Diagnosis present

## 2022-11-11 DIAGNOSIS — Z79899 Other long term (current) drug therapy: Secondary | ICD-10-CM

## 2022-11-11 DIAGNOSIS — G629 Polyneuropathy, unspecified: Secondary | ICD-10-CM | POA: Diagnosis present

## 2022-11-11 DIAGNOSIS — G40109 Localization-related (focal) (partial) symptomatic epilepsy and epileptic syndromes with simple partial seizures, not intractable, without status epilepticus: Secondary | ICD-10-CM | POA: Diagnosis present

## 2022-11-11 DIAGNOSIS — Z8673 Personal history of transient ischemic attack (TIA), and cerebral infarction without residual deficits: Secondary | ICD-10-CM

## 2022-11-11 DIAGNOSIS — Y92099 Unspecified place in other non-institutional residence as the place of occurrence of the external cause: Secondary | ICD-10-CM

## 2022-11-11 DIAGNOSIS — J4489 Other specified chronic obstructive pulmonary disease: Secondary | ICD-10-CM | POA: Diagnosis present

## 2022-11-11 DIAGNOSIS — Z886 Allergy status to analgesic agent status: Secondary | ICD-10-CM

## 2022-11-11 DIAGNOSIS — J449 Chronic obstructive pulmonary disease, unspecified: Secondary | ICD-10-CM | POA: Insufficient documentation

## 2022-11-11 DIAGNOSIS — W19XXXA Unspecified fall, initial encounter: Secondary | ICD-10-CM

## 2022-11-11 DIAGNOSIS — S52501A Unspecified fracture of the lower end of right radius, initial encounter for closed fracture: Secondary | ICD-10-CM

## 2022-11-11 DIAGNOSIS — E876 Hypokalemia: Secondary | ICD-10-CM | POA: Diagnosis present

## 2022-11-11 DIAGNOSIS — F17210 Nicotine dependence, cigarettes, uncomplicated: Secondary | ICD-10-CM | POA: Diagnosis present

## 2022-11-11 DIAGNOSIS — I10 Essential (primary) hypertension: Secondary | ICD-10-CM | POA: Diagnosis present

## 2022-11-11 DIAGNOSIS — S52571A Other intraarticular fracture of lower end of right radius, initial encounter for closed fracture: Principal | ICD-10-CM | POA: Diagnosis present

## 2022-11-11 DIAGNOSIS — S62101A Fracture of unspecified carpal bone, right wrist, initial encounter for closed fracture: Secondary | ICD-10-CM

## 2022-11-11 DIAGNOSIS — Z7951 Long term (current) use of inhaled steroids: Secondary | ICD-10-CM

## 2022-11-11 DIAGNOSIS — Z7902 Long term (current) use of antithrombotics/antiplatelets: Secondary | ICD-10-CM

## 2022-11-11 DIAGNOSIS — F32A Depression, unspecified: Secondary | ICD-10-CM | POA: Diagnosis present

## 2022-11-11 DIAGNOSIS — D500 Iron deficiency anemia secondary to blood loss (chronic): Secondary | ICD-10-CM | POA: Diagnosis present

## 2022-11-11 DIAGNOSIS — R296 Repeated falls: Secondary | ICD-10-CM | POA: Diagnosis present

## 2022-11-11 DIAGNOSIS — Z881 Allergy status to other antibiotic agents status: Secondary | ICD-10-CM

## 2022-11-11 DIAGNOSIS — Z803 Family history of malignant neoplasm of breast: Secondary | ICD-10-CM

## 2022-11-11 DIAGNOSIS — S42211A Unspecified displaced fracture of surgical neck of right humerus, initial encounter for closed fracture: Secondary | ICD-10-CM | POA: Diagnosis present

## 2022-11-11 DIAGNOSIS — W010XXA Fall on same level from slipping, tripping and stumbling without subsequent striking against object, initial encounter: Secondary | ICD-10-CM | POA: Diagnosis present

## 2022-11-11 DIAGNOSIS — G4733 Obstructive sleep apnea (adult) (pediatric): Secondary | ICD-10-CM | POA: Diagnosis present

## 2022-11-11 DIAGNOSIS — F419 Anxiety disorder, unspecified: Secondary | ICD-10-CM | POA: Insufficient documentation

## 2022-11-11 DIAGNOSIS — F0394 Unspecified dementia, unspecified severity, with anxiety: Secondary | ICD-10-CM | POA: Diagnosis present

## 2022-11-11 DIAGNOSIS — E7800 Pure hypercholesterolemia, unspecified: Secondary | ICD-10-CM | POA: Diagnosis present

## 2022-11-11 DIAGNOSIS — G4730 Sleep apnea, unspecified: Secondary | ICD-10-CM | POA: Insufficient documentation

## 2022-11-11 LAB — BASIC METABOLIC PANEL
Anion gap: 6 (ref 5–15)
BUN: 14 mg/dL (ref 8–23)
CO2: 27 mmol/L (ref 22–32)
Calcium: 8.6 mg/dL — ABNORMAL LOW (ref 8.9–10.3)
Chloride: 104 mmol/L (ref 98–111)
Creatinine, Ser: 0.79 mg/dL (ref 0.44–1.00)
GFR, Estimated: >60 mL/min (ref >60)
Glucose, Bld: 109 mg/dL — ABNORMAL HIGH (ref 70–99)
Potassium: 3.2 mmol/L — ABNORMAL LOW (ref 3.5–5.1)
Sodium: 137 mmol/L (ref 135–145)

## 2022-11-11 LAB — CBC
HCT: 34.1 % — ABNORMAL LOW (ref 36.0–46.0)
Hemoglobin: 11.7 g/dL — ABNORMAL LOW (ref 12.0–15.0)
MCH: 34 pg (ref 26.0–34.0)
MCHC: 34.3 g/dL (ref 30.0–36.0)
MCV: 99.1 fL (ref 80.0–100.0)
Platelets: 198 10*3/uL (ref 150–400)
RBC: 3.44 MIL/uL — ABNORMAL LOW (ref 3.87–5.11)
RDW: 12.6 % (ref 11.5–15.5)
WBC: 8.1 10*3/uL (ref 4.0–10.5)
nRBC: 0 % (ref 0.0–0.2)

## 2022-11-11 MED ORDER — FENTANYL CITRATE PF 50 MCG/ML IJ SOSY
25.0000 ug | PREFILLED_SYRINGE | Freq: Once | INTRAMUSCULAR | Status: AC
  Administered 2022-11-12: 25 ug via INTRAVENOUS
  Filled 2022-11-11: qty 1

## 2022-11-11 MED ORDER — CEFAZOLIN SODIUM-DEXTROSE 2-4 GM/100ML-% IV SOLN
2.0000 g | INTRAVENOUS | Status: AC
  Administered 2022-11-12: 2 g via INTRAVENOUS

## 2022-11-11 MED ORDER — SODIUM CHLORIDE 0.9 % IV SOLN: INTRAVENOUS | Status: DC

## 2022-11-11 MED ORDER — MELOXICAM 7.5 MG PO TABS
15.0000 mg | ORAL_TABLET | Freq: Once | ORAL | Status: AC
  Administered 2022-11-12: 15 mg via ORAL
  Filled 2022-11-11: qty 2

## 2022-11-11 MED ORDER — POTASSIUM CHLORIDE 20 MEQ PO PACK
40.0000 meq | PACK | Freq: Every day | ORAL | Status: DC
  Administered 2022-11-12: 40 meq via ORAL
  Filled 2022-11-11: qty 2

## 2022-11-11 MED ORDER — GABAPENTIN 400 MG PO CAPS
400.0000 mg | ORAL_CAPSULE | Freq: Two times a day (BID) | ORAL | Status: DC
  Administered 2022-11-12 (×3): 400 mg via ORAL
  Filled 2022-11-11 (×3): qty 1

## 2022-11-11 MED ORDER — MORPHINE SULFATE (PF) 2 MG/ML IV SOLN
1.0000 mg | INTRAVENOUS | Status: DC | PRN
  Administered 2022-11-12: 1 mg via INTRAVENOUS
  Filled 2022-11-11: qty 1

## 2022-11-11 NOTE — ED Provider Notes (Signed)
[facility] Provider Note    Event Date/Time   First MD Initiated Contact with Patient 11/11/22 2318     (approximate)   History   Fall   HPI  Marcia Brown is a 67 y.o. female with a history of COPD, stroke, seizure disorder, anxiety, and dementia who presents from a nursing facility with a right wrist injury, acute onset this evening when the patient states that she lost her balance and fell.  She states that she frequently feels a sensation of vertigo her head spinning and this has been going on for a while.  Sometimes this has caused her to fall.  She states that she was unable to grab onto something and fell hitting the right wrist.  She also states she hit her head but does not have a headache or any further dizziness.  She has no vomiting.  She denies any neck, back, or leg pain.  I reviewed the past medical records.  The patient was most recently seen by Dr. Malvin Johns from neurology on 4/29 for follow-up of her chronic conditions including recurrent dizziness and falls.   Physical Exam   Triage Vital Signs: ED Triage Vitals  Enc Vitals Group     BP 11/11/22 2025 123/71     Pulse Rate 11/11/22 2025 60     Resp 11/11/22 2025 18     Temp 11/11/22 2025 98.5 F (36.9 C)     Temp Source 11/11/22 2025 Axillary     SpO2 11/11/22 2025 100 %     Weight --      Height 11/11/22 2026 5' (1.524 m)     Head Circumference --      Peak Flow --      Pain Score 11/11/22 2026 5     Pain Loc --      Pain Edu? --      Excl. in GC? --     Most recent vital signs: Vitals:   11/11/22 2025 11/12/22 0056  BP: 123/71 (!) 142/72  Pulse: 60 88  Resp: 18 18  Temp: 98.5 F (36.9 C) 98.6 F (37 C)  SpO2: 100% 100%     General: Awake, no distress.  CV:  Good peripheral perfusion.  Resp:  Normal effort.  Abd:  No distention.  Other:  Right wrist deformity.  2+ radial pulse.  Normal cap refill distally.  Motor and sensory intact in median, radial, and ulnar  distributions of the right hand, limited due to pain.  EOMI.  PERRLA.  Motor intact in all extremities.  Normal coordination and speech.   ED Results / Procedures / Treatments   Labs (all labs ordered are listed, but only abnormal results are displayed) Labs Reviewed  CBC - Abnormal; Notable for the following components:      Result Value   RBC 3.44 (*)    Hemoglobin 11.7 (*)    HCT 34.1 (*)    All other components within normal limits  BASIC METABOLIC PANEL - Abnormal; Notable for the following components:   Potassium 3.2 (*)    Glucose, Bld 109 (*)    Calcium 8.6 (*)    All other components within normal limits  HIV ANTIBODY (ROUTINE TESTING W REFLEX)     EKG     RADIOLOGY  XR R wrist: I independently viewed and interpreted the images; there is a distal radius and ulna fracture with displacement and angulation   PROCEDURES:  Critical Care performed: No  Procedures  MEDICATIONS ORDERED IN ED: Medications  0.9 %  sodium chloride infusion ( Intravenous New Bag/Given 11/12/22 0018)  ceFAZolin (ANCEF) IVPB 2g/100 mL premix (has no administration in time range)  morphine (PF) 2 MG/ML injection 1 mg (1 mg Intravenous Given 11/12/22 0145)  gabapentin (NEURONTIN) capsule 400 mg (400 mg Oral Given 11/12/22 0004)  potassium chloride (KLOR-CON) packet 40 mEq (40 mEq Oral Given 11/12/22 0008)  potassium chloride SA (KLOR-CON M) CR tablet 40 mEq (has no administration in time range)  atorvastatin (LIPITOR) tablet 10 mg (has no administration in time range)  losartan (COZAAR) tablet 25 mg (has no administration in time range)  DULoxetine (CYMBALTA) DR capsule 60 mg (has no administration in time range)  rosuvastatin (CRESTOR) tablet 5 mg (has no administration in time range)  mometasone-formoterol (DULERA) 100-5 MCG/ACT inhaler 2 puff (has no administration in time range)  albuterol (PROVENTIL) (2.5 MG/3ML) 0.083% nebulizer solution 2.5 mg (has no administration in time range)   acetaminophen (TYLENOL) tablet 650 mg (has no administration in time range)    Or  acetaminophen (TYLENOL) suppository 650 mg (has no administration in time range)  ondansetron (ZOFRAN) tablet 4 mg (has no administration in time range)    Or  ondansetron (ZOFRAN) injection 4 mg (has no administration in time range)  HYDROcodone-acetaminophen (NORCO/VICODIN) 5-325 MG per tablet 1-2 tablet (has no administration in time range)  ketorolac (TORADOL) 30 MG/ML injection 30 mg (has no administration in time range)  morphine (PF) 2 MG/ML injection 2 mg (has no administration in time range)  fentaNYL (SUBLIMAZE) injection 25 mcg (25 mcg Intravenous Given 11/12/22 0004)  meloxicam (MOBIC) tablet 15 mg (15 mg Oral Given 11/12/22 0037)     IMPRESSION / MDM / ASSESSMENT AND PLAN / ED COURSE  I reviewed the triage vital signs and the nursing notes.  67 year old female with PMH as noted above presents with right wrist deformity after a fall from standing height after she had an episode of vertigo, which appears to be a chronic problem for her based on prior notes.  She states she may have hit her head but denies any headache or other acute injuries besides the right wrist.  On exam the right hand is neuro/vascular intact.  Differential diagnosis includes, but is not limited to, distal radius fracture, contusion.  X-rays confirm a fracture.  I consulted Dr. Hyacinth Meeker from orthopedics and discussed the case with him; he recommends admission to the hospitalist service with a plan for surgery.  He does not recommend doing a reduction in the ED, but instead recommends that we place a splint just to stabilize the fracture for now.  We also obtain a CT of the head and cervical spine although my suspicion for acute injury is low.  Patient's presentation is most consistent with acute presentation with potential threat to life or bodily function.  ----------------------------------------- 1:33 AM on  11/12/2022 -----------------------------------------  Basic labs are unremarkable with no leukocytosis or significant anemia.  Electrolytes are normal except for borderline low potassium.  CT head and cervical spine are negative for acute traumatic findings.  Splint was placed by the ED tech.  I consulted Dr. Para March from the hospitalist service; based on our discussion she agrees to evaluate the patient for admission.    FINAL CLINICAL IMPRESSION(S) / ED DIAGNOSES   Final diagnoses:  Fall, initial encounter  Closed fracture of distal end of right radius, unspecified fracture morphology, initial encounter     Rx / DC Orders   ED  Discharge Orders     None        Note:  This document was prepared using Dragon voice recognition software and may include unintentional dictation errors.    Dionne Bucy, MD 11/12/22 [phone]

## 2022-11-11 NOTE — ED Triage Notes (Signed)
Pt to ed from the [facility] for a unwitnessed fall via [facility]. Pt has obvious deformity to the right wrist. Pt has good PMS and sensation in the same.   22 L wrist  fentanyl 4mg  zofran 131/74 98% RA  Pt has Hx of seizures. Pt is unable to tell me what day or month it is. Unknown baseline information provided by EMS.

## 2022-11-11 NOTE — ED Notes (Signed)
This RN unable to get blood. Lab called for collection

## 2022-11-12 ENCOUNTER — Inpatient Hospital Stay: Payer: Medicare Other | Admitting: Anesthesiology

## 2022-11-12 ENCOUNTER — Encounter
Admission: EM | Disposition: A | Discharge status: Nursing Home (Includes ICF) | Payer: Self-pay | Source: Skilled Nursing Facility | Attending: Internal Medicine

## 2022-11-12 ENCOUNTER — Inpatient Hospital Stay: Payer: Medicare Other

## 2022-11-12 ENCOUNTER — Other Ambulatory Visit: Payer: Self-pay

## 2022-11-12 ENCOUNTER — Emergency Department: Payer: Medicare Other

## 2022-11-12 ENCOUNTER — Encounter: Payer: Self-pay | Admitting: Internal Medicine

## 2022-11-12 DIAGNOSIS — E7800 Pure hypercholesterolemia, unspecified: Secondary | ICD-10-CM | POA: Diagnosis present

## 2022-11-12 DIAGNOSIS — G40109 Localization-related (focal) (partial) symptomatic epilepsy and epileptic syndromes with simple partial seizures, not intractable, without status epilepticus: Secondary | ICD-10-CM

## 2022-11-12 DIAGNOSIS — W19XXXA Unspecified fall, initial encounter: Secondary | ICD-10-CM | POA: Diagnosis not present

## 2022-11-12 DIAGNOSIS — Z886 Allergy status to analgesic agent status: Secondary | ICD-10-CM | POA: Diagnosis not present

## 2022-11-12 DIAGNOSIS — R296 Repeated falls: Secondary | ICD-10-CM | POA: Diagnosis present

## 2022-11-12 DIAGNOSIS — Z8673 Personal history of transient ischemic attack (TIA), and cerebral infarction without residual deficits: Secondary | ICD-10-CM | POA: Diagnosis not present

## 2022-11-12 DIAGNOSIS — J449 Chronic obstructive pulmonary disease, unspecified: Secondary | ICD-10-CM | POA: Insufficient documentation

## 2022-11-12 DIAGNOSIS — Z803 Family history of malignant neoplasm of breast: Secondary | ICD-10-CM | POA: Diagnosis not present

## 2022-11-12 DIAGNOSIS — S62101A Fracture of unspecified carpal bone, right wrist, initial encounter for closed fracture: Secondary | ICD-10-CM | POA: Diagnosis not present

## 2022-11-12 DIAGNOSIS — W010XXA Fall on same level from slipping, tripping and stumbling without subsequent striking against object, initial encounter: Secondary | ICD-10-CM | POA: Diagnosis present

## 2022-11-12 DIAGNOSIS — R42 Dizziness and giddiness: Secondary | ICD-10-CM | POA: Diagnosis present

## 2022-11-12 DIAGNOSIS — J4489 Other specified chronic obstructive pulmonary disease: Secondary | ICD-10-CM | POA: Diagnosis present

## 2022-11-12 DIAGNOSIS — I10 Essential (primary) hypertension: Secondary | ICD-10-CM | POA: Diagnosis present

## 2022-11-12 DIAGNOSIS — S52501A Unspecified fracture of the lower end of right radius, initial encounter for closed fracture: Secondary | ICD-10-CM | POA: Diagnosis not present

## 2022-11-12 DIAGNOSIS — S42211A Unspecified displaced fracture of surgical neck of right humerus, initial encounter for closed fracture: Secondary | ICD-10-CM | POA: Diagnosis present

## 2022-11-12 DIAGNOSIS — F419 Anxiety disorder, unspecified: Secondary | ICD-10-CM | POA: Insufficient documentation

## 2022-11-12 DIAGNOSIS — Z7902 Long term (current) use of antithrombotics/antiplatelets: Secondary | ICD-10-CM | POA: Diagnosis not present

## 2022-11-12 DIAGNOSIS — G629 Polyneuropathy, unspecified: Secondary | ICD-10-CM | POA: Diagnosis present

## 2022-11-12 DIAGNOSIS — Z79899 Other long term (current) drug therapy: Secondary | ICD-10-CM | POA: Diagnosis not present

## 2022-11-12 DIAGNOSIS — Y92099 Unspecified place in other non-institutional residence as the place of occurrence of the external cause: Secondary | ICD-10-CM | POA: Diagnosis not present

## 2022-11-12 DIAGNOSIS — Z7951 Long term (current) use of inhaled steroids: Secondary | ICD-10-CM | POA: Diagnosis not present

## 2022-11-12 DIAGNOSIS — G4730 Sleep apnea, unspecified: Secondary | ICD-10-CM | POA: Insufficient documentation

## 2022-11-12 DIAGNOSIS — D500 Iron deficiency anemia secondary to blood loss (chronic): Secondary | ICD-10-CM | POA: Diagnosis present

## 2022-11-12 DIAGNOSIS — Z881 Allergy status to other antibiotic agents status: Secondary | ICD-10-CM | POA: Diagnosis not present

## 2022-11-12 DIAGNOSIS — G4733 Obstructive sleep apnea (adult) (pediatric): Secondary | ICD-10-CM | POA: Diagnosis present

## 2022-11-12 DIAGNOSIS — F0394 Unspecified dementia, unspecified severity, with anxiety: Secondary | ICD-10-CM | POA: Diagnosis present

## 2022-11-12 DIAGNOSIS — F17210 Nicotine dependence, cigarettes, uncomplicated: Secondary | ICD-10-CM | POA: Diagnosis present

## 2022-11-12 DIAGNOSIS — S52571A Other intraarticular fracture of lower end of right radius, initial encounter for closed fracture: Secondary | ICD-10-CM | POA: Diagnosis present

## 2022-11-12 DIAGNOSIS — F32A Depression, unspecified: Secondary | ICD-10-CM | POA: Diagnosis present

## 2022-11-12 DIAGNOSIS — E876 Hypokalemia: Secondary | ICD-10-CM | POA: Diagnosis present

## 2022-11-12 HISTORY — PX: OPEN REDUCTION INTERNAL FIXATION (ORIF) DISTAL RADIAL FRACTURE: SHX5989

## 2022-11-12 SURGERY — OPEN REDUCTION INTERNAL FIXATION (ORIF) DISTAL RADIUS FRACTURE
Anesthesia: General | Site: Wrist | Laterality: Right

## 2022-11-12 MED ORDER — PHENYTOIN SODIUM EXTENDED 100 MG PO CAPS
100.0000 mg | ORAL_CAPSULE | Freq: Two times a day (BID) | ORAL | Status: DC
  Administered 2022-11-12 – 2022-11-15 (×6): 100 mg via ORAL
  Filled 2022-11-12 (×6): qty 1

## 2022-11-12 MED ORDER — ZOLPIDEM TARTRATE 5 MG PO TABS: 5.0000 mg | ORAL_TABLET | Freq: Every evening | ORAL | Status: DC | PRN

## 2022-11-12 MED ORDER — KETOROLAC TROMETHAMINE 30 MG/ML IJ SOLN: 30.0000 mg | Freq: Four times a day (QID) | INTRAMUSCULAR | Status: DC | PRN

## 2022-11-12 MED ORDER — LACTATED RINGERS IV SOLN: INTRAVENOUS | Status: DC | PRN

## 2022-11-12 MED ORDER — NEOMYCIN-POLYMYXIN B GU 40-200000 IR SOLN
Status: AC
  Filled 2022-11-12: qty 20

## 2022-11-12 MED ORDER — QUETIAPINE FUMARATE 25 MG PO TABS
25.0000 mg | ORAL_TABLET | Freq: Every day | ORAL | Status: DC
  Administered 2022-11-12 – 2022-11-14 (×3): 25 mg via ORAL
  Filled 2022-11-12 (×3): qty 1

## 2022-11-12 MED ORDER — PANTOPRAZOLE SODIUM 20 MG PO TBEC
20.0000 mg | DELAYED_RELEASE_TABLET | Freq: Every day | ORAL | Status: DC
  Administered 2022-11-12: 20 mg via ORAL
  Filled 2022-11-12: qty 1

## 2022-11-12 MED ORDER — BUPIVACAINE HCL (PF) 0.5 % IJ SOLN
INTRAMUSCULAR | Status: DC | PRN
  Administered 2022-11-12: 20 mL

## 2022-11-12 MED ORDER — FENTANYL CITRATE (PF) 100 MCG/2ML IJ SOLN
INTRAMUSCULAR | Status: AC
  Filled 2022-11-12: qty 2

## 2022-11-12 MED ORDER — HYDROCODONE-ACETAMINOPHEN 5-325 MG PO TABS: 1.0000 | ORAL_TABLET | ORAL | Status: DC | PRN

## 2022-11-12 MED ORDER — TRAMADOL HCL 50 MG PO TABS
50.0000 mg | ORAL_TABLET | Freq: Three times a day (TID) | ORAL | Status: DC
  Administered 2022-11-12 – 2022-11-15 (×8): 50 mg via ORAL
  Filled 2022-11-12 (×8): qty 1

## 2022-11-12 MED ORDER — LAMOTRIGINE 25 MG PO TABS: 200.0000 mg | ORAL_TABLET | Freq: Two times a day (BID) | ORAL | Status: DC

## 2022-11-12 MED ORDER — CLONAZEPAM 0.5 MG PO TABS
0.5000 mg | ORAL_TABLET | Freq: Three times a day (TID) | ORAL | Status: DC
  Administered 2022-11-12 – 2022-11-14 (×5): 0.5 mg via ORAL
  Filled 2022-11-12 (×5): qty 1

## 2022-11-12 MED ORDER — MORPHINE SULFATE (PF) 2 MG/ML IV SOLN: 2.0000 mg | INTRAVENOUS | Status: DC | PRN

## 2022-11-12 MED ORDER — LIDOCAINE HCL (PF) 2 % IJ SOLN
INTRAMUSCULAR | Status: AC
  Filled 2022-11-12: qty 5

## 2022-11-12 MED ORDER — LEVETIRACETAM 500 MG PO TABS
1000.0000 mg | ORAL_TABLET | Freq: Every day | ORAL | Status: DC
  Filled 2022-11-12: qty 2

## 2022-11-12 MED ORDER — ALPRAZOLAM 0.5 MG PO TABS: 0.5000 mg | ORAL_TABLET | Freq: Four times a day (QID) | ORAL | Status: DC | PRN

## 2022-11-12 MED ORDER — CLOPIDOGREL BISULFATE 75 MG PO TABS
75.0000 mg | ORAL_TABLET | Freq: Every day | ORAL | Status: DC
  Administered 2022-11-13 – 2022-11-15 (×3): 75 mg via ORAL
  Filled 2022-11-12 (×3): qty 1

## 2022-11-12 MED ORDER — LAMOTRIGINE 25 MG PO TABS
200.0000 mg | ORAL_TABLET | Freq: Every morning | ORAL | Status: DC
  Administered 2022-11-13 – 2022-11-15 (×3): 200 mg via ORAL
  Filled 2022-11-12 (×3): qty 8

## 2022-11-12 MED ORDER — ONDANSETRON HCL 4 MG/2ML IJ SOLN: 4.0000 mg | Freq: Four times a day (QID) | INTRAMUSCULAR | Status: DC | PRN

## 2022-11-12 MED ORDER — FENTANYL CITRATE (PF) 100 MCG/2ML IJ SOLN
INTRAMUSCULAR | Status: DC | PRN
  Administered 2022-11-12 (×4): 25 ug via INTRAVENOUS

## 2022-11-12 MED ORDER — LACOSAMIDE 50 MG PO TABS
150.0000 mg | ORAL_TABLET | Freq: Two times a day (BID) | ORAL | Status: DC
  Filled 2022-11-12: qty 3

## 2022-11-12 MED ORDER — ALBUTEROL SULFATE (2.5 MG/3ML) 0.083% IN NEBU: 2.5000 mg | INHALATION_SOLUTION | RESPIRATORY_TRACT | Status: DC | PRN

## 2022-11-12 MED ORDER — OXYCODONE HCL 5 MG PO TABS
ORAL_TABLET | ORAL | Status: AC
  Filled 2022-11-12: qty 1

## 2022-11-12 MED ORDER — ROSUVASTATIN CALCIUM 5 MG PO TABS
5.0000 mg | ORAL_TABLET | Freq: Every day | ORAL | Status: DC
  Administered 2022-11-12 – 2022-11-14 (×3): 5 mg via ORAL
  Filled 2022-11-12 (×3): qty 1

## 2022-11-12 MED ORDER — PHENYTOIN SODIUM EXTENDED 100 MG PO CAPS
100.0000 mg | ORAL_CAPSULE | Freq: Two times a day (BID) | ORAL | Status: DC
  Administered 2022-11-12 (×2): 100 mg via ORAL
  Filled 2022-11-12 (×2): qty 1

## 2022-11-12 MED ORDER — LEVETIRACETAM 500 MG PO TABS
500.0000 mg | ORAL_TABLET | Freq: Two times a day (BID) | ORAL | Status: DC
  Filled 2022-11-12: qty 1

## 2022-11-12 MED ORDER — CHOLECALCIFEROL 10 MCG/ML (400 UNIT/ML) PO LIQD
400.0000 [IU] | Freq: Every day | ORAL | Status: DC
  Administered 2022-11-13 – 2022-11-15 (×3): 400 [IU] via ORAL
  Filled 2022-11-12 (×3): qty 1

## 2022-11-12 MED ORDER — ONDANSETRON HCL 4 MG/2ML IJ SOLN: 4.0000 mg | Freq: Once | INTRAMUSCULAR | Status: DC | PRN

## 2022-11-12 MED ORDER — LAMOTRIGINE 100 MG PO TABS
300.0000 mg | ORAL_TABLET | Freq: Every day | ORAL | Status: DC
  Filled 2022-11-12: qty 3

## 2022-11-12 MED ORDER — DULOXETINE HCL 20 MG PO CPEP
40.0000 mg | ORAL_CAPSULE | Freq: Two times a day (BID) | ORAL | Status: DC
  Administered 2022-11-12 – 2022-11-15 (×6): 40 mg via ORAL
  Filled 2022-11-12 (×6): qty 2

## 2022-11-12 MED ORDER — ACETAMINOPHEN 10 MG/ML IV SOLN: 1000.0000 mg | Freq: Once | INTRAVENOUS | Status: DC | PRN

## 2022-11-12 MED ORDER — CEFAZOLIN SODIUM 1 G IJ SOLR
INTRAMUSCULAR | Status: AC
  Filled 2022-11-12: qty 10

## 2022-11-12 MED ORDER — POTASSIUM CHLORIDE CRYS ER 20 MEQ PO TBCR
40.0000 meq | EXTENDED_RELEASE_TABLET | Freq: Once | ORAL | Status: AC
  Administered 2022-11-12: 40 meq via ORAL
  Filled 2022-11-12 (×2): qty 2

## 2022-11-12 MED ORDER — LEVETIRACETAM 500 MG PO TABS
500.0000 mg | ORAL_TABLET | Freq: Every day | ORAL | Status: DC
  Administered 2022-11-12: 500 mg via ORAL
  Filled 2022-11-12: qty 1

## 2022-11-12 MED ORDER — LOSARTAN POTASSIUM 50 MG PO TABS
50.0000 mg | ORAL_TABLET | Freq: Every day | ORAL | Status: DC
  Administered 2022-11-13 – 2022-11-15 (×3): 50 mg via ORAL
  Filled 2022-11-12 (×3): qty 1

## 2022-11-12 MED ORDER — DEXAMETHASONE SODIUM PHOSPHATE 10 MG/ML IJ SOLN
INTRAMUSCULAR | Status: AC
  Filled 2022-11-12: qty 1

## 2022-11-12 MED ORDER — GABAPENTIN 100 MG PO CAPS
200.0000 mg | ORAL_CAPSULE | Freq: Every day | ORAL | Status: DC
  Administered 2022-11-13 – 2022-11-14 (×2): 200 mg via ORAL
  Filled 2022-11-12 (×3): qty 2

## 2022-11-12 MED ORDER — LOSARTAN POTASSIUM 25 MG PO TABS
25.0000 mg | ORAL_TABLET | Freq: Every day | ORAL | Status: DC
  Administered 2022-11-12: 25 mg via ORAL
  Filled 2022-11-12: qty 1

## 2022-11-12 MED ORDER — LEVETIRACETAM 500 MG PO TABS
1000.0000 mg | ORAL_TABLET | Freq: Every day | ORAL | Status: DC
  Administered 2022-11-13 – 2022-11-14 (×3): 1000 mg via ORAL
  Filled 2022-11-12 (×3): qty 2

## 2022-11-12 MED ORDER — PHENYLEPHRINE HCL (PRESSORS) 10 MG/ML IV SOLN
INTRAVENOUS | Status: DC | PRN
  Administered 2022-11-12: 80 ug via INTRAVENOUS

## 2022-11-12 MED ORDER — VITAMIN B-12 1000 MCG PO TABS
500.0000 ug | ORAL_TABLET | Freq: Every day | ORAL | Status: DC
  Administered 2022-11-13 – 2022-11-15 (×3): 500 ug via ORAL
  Filled 2022-11-12 (×3): qty 1

## 2022-11-12 MED ORDER — PROPOFOL 10 MG/ML IV BOLUS
INTRAVENOUS | Status: AC
  Filled 2022-11-12: qty 20

## 2022-11-12 MED ORDER — LIDOCAINE HCL (CARDIAC) PF 100 MG/5ML IV SOSY
PREFILLED_SYRINGE | INTRAVENOUS | Status: DC | PRN
  Administered 2022-11-12: 100 mg via INTRAVENOUS

## 2022-11-12 MED ORDER — DULOXETINE HCL 20 MG PO CPEP
20.0000 mg | ORAL_CAPSULE | Freq: Two times a day (BID) | ORAL | Status: DC
  Administered 2022-11-12 (×2): 20 mg via ORAL
  Filled 2022-11-12 (×2): qty 1

## 2022-11-12 MED ORDER — ONDANSETRON HCL 4 MG/2ML IJ SOLN
INTRAMUSCULAR | Status: DC | PRN
  Administered 2022-11-12: 4 mg via INTRAVENOUS

## 2022-11-12 MED ORDER — DIPHENHYDRAMINE HCL 25 MG PO CAPS: 25.0000 mg | ORAL_CAPSULE | Freq: Four times a day (QID) | ORAL | Status: DC | PRN

## 2022-11-12 MED ORDER — LACOSAMIDE 50 MG PO TABS
150.0000 mg | ORAL_TABLET | Freq: Two times a day (BID) | ORAL | Status: DC
  Administered 2022-11-12 – 2022-11-15 (×7): 150 mg via ORAL
  Filled 2022-11-12 (×7): qty 3

## 2022-11-12 MED ORDER — ONDANSETRON HCL 4 MG PO TABS: 4.0000 mg | ORAL_TABLET | Freq: Four times a day (QID) | ORAL | Status: DC | PRN

## 2022-11-12 MED ORDER — FAMOTIDINE 20 MG PO TABS: 20.0000 mg | ORAL_TABLET | Freq: Two times a day (BID) | ORAL | Status: DC | PRN

## 2022-11-12 MED ORDER — ACETAMINOPHEN 325 MG PO TABS: 325.0000 mg | ORAL_TABLET | Freq: Four times a day (QID) | ORAL | Status: DC | PRN

## 2022-11-12 MED ORDER — CARBAMAZEPINE 100 MG PO CHEW
150.0000 mg | CHEWABLE_TABLET | Freq: Two times a day (BID) | ORAL | Status: DC
  Administered 2022-11-13 – 2022-11-15 (×5): 150 mg via ORAL
  Filled 2022-11-12 (×8): qty 1.5

## 2022-11-12 MED ORDER — ONDANSETRON HCL 4 MG/2ML IJ SOLN
INTRAMUSCULAR | Status: AC
  Filled 2022-11-12: qty 2

## 2022-11-12 MED ORDER — ACETAMINOPHEN 325 MG PO TABS: 650.0000 mg | ORAL_TABLET | Freq: Four times a day (QID) | ORAL | Status: DC | PRN

## 2022-11-12 MED ORDER — SUCRALFATE 1 GM/10ML PO SUSP
1.0000 g | Freq: Four times a day (QID) | ORAL | Status: DC
  Administered 2022-11-12: 1 g via ORAL
  Filled 2022-11-12 (×2): qty 10

## 2022-11-12 MED ORDER — ALUM & MAG HYDROXIDE-SIMETH 200-200-20 MG/5ML PO SUSP: 30.0000 mL | Freq: Four times a day (QID) | ORAL | Status: DC | PRN

## 2022-11-12 MED ORDER — GUAIFENESIN 100 MG/5ML PO LIQD: 15.0000 mL | Freq: Four times a day (QID) | ORAL | Status: DC | PRN

## 2022-11-12 MED ORDER — FENTANYL CITRATE (PF) 100 MCG/2ML IJ SOLN
25.0000 ug | INTRAMUSCULAR | Status: DC | PRN
  Administered 2022-11-12 (×3): 25 ug via INTRAVENOUS

## 2022-11-12 MED ORDER — MORPHINE SULFATE (PF) 2 MG/ML IV SOLN: 0.5000 mg | INTRAVENOUS | Status: DC | PRN

## 2022-11-12 MED ORDER — SALINE SPRAY 0.65 % NA SOLN: 2.0000 | NASAL | Status: DC | PRN

## 2022-11-12 MED ORDER — LAMOTRIGINE 100 MG PO TABS
300.0000 mg | ORAL_TABLET | Freq: Every day | ORAL | Status: DC
  Administered 2022-11-13 – 2022-11-14 (×3): 300 mg via ORAL
  Filled 2022-11-12 (×2): qty 3
  Filled 2022-11-12: qty 12
  Filled 2022-11-12: qty 3

## 2022-11-12 MED ORDER — LOPERAMIDE HCL 2 MG PO CAPS: 4.0000 mg | ORAL_CAPSULE | ORAL | Status: DC | PRN

## 2022-11-12 MED ORDER — BUPIVACAINE HCL (PF) 0.5 % IJ SOLN
INTRAMUSCULAR | Status: AC
  Filled 2022-11-12: qty 30

## 2022-11-12 MED ORDER — CARBAMAZEPINE 100 MG PO CHEW
100.0000 mg | CHEWABLE_TABLET | Freq: Three times a day (TID) | ORAL | Status: DC
  Administered 2022-11-12: 100 mg via ORAL
  Filled 2022-11-12 (×3): qty 1

## 2022-11-12 MED ORDER — LORATADINE 10 MG PO TABS
10.0000 mg | ORAL_TABLET | Freq: Every day | ORAL | Status: DC
  Administered 2022-11-13 – 2022-11-15 (×3): 10 mg via ORAL
  Filled 2022-11-12 (×3): qty 1

## 2022-11-12 MED ORDER — PANTOPRAZOLE SODIUM 40 MG PO TBEC
40.0000 mg | DELAYED_RELEASE_TABLET | Freq: Every day | ORAL | Status: DC
  Administered 2022-11-13 – 2022-11-15 (×3): 40 mg via ORAL
  Filled 2022-11-12 (×3): qty 1

## 2022-11-12 MED ORDER — METHOCARBAMOL 1000 MG/10ML IJ SOLN: 500.0000 mg | Freq: Four times a day (QID) | INTRAVENOUS | Status: DC | PRN

## 2022-11-12 MED ORDER — MOMETASONE FURO-FORMOTEROL FUM 100-5 MCG/ACT IN AERO
2.0000 | INHALATION_SPRAY | Freq: Two times a day (BID) | RESPIRATORY_TRACT | Status: DC
  Administered 2022-11-13 – 2022-11-15 (×5): 2 via RESPIRATORY_TRACT
  Filled 2022-11-12 (×2): qty 8.8

## 2022-11-12 MED ORDER — POTASSIUM CHLORIDE CRYS ER 20 MEQ PO TBCR
20.0000 meq | EXTENDED_RELEASE_TABLET | Freq: Every day | ORAL | Status: DC
  Administered 2022-11-13 – 2022-11-15 (×3): 20 meq via ORAL
  Filled 2022-11-12 (×3): qty 1

## 2022-11-12 MED ORDER — DOCUSATE SODIUM 50 MG/5ML PO LIQD
50.0000 mg | Freq: Every day | ORAL | Status: DC
  Administered 2022-11-13 – 2022-11-15 (×3): 50 mg via ORAL
  Filled 2022-11-12 (×3): qty 10

## 2022-11-12 MED ORDER — ACETAMINOPHEN 10 MG/ML IV SOLN
INTRAVENOUS | Status: DC | PRN
  Administered 2022-11-12: 1000 mg via INTRAVENOUS

## 2022-11-12 MED ORDER — PROPOFOL 10 MG/ML IV BOLUS
INTRAVENOUS | Status: DC | PRN
  Administered 2022-11-12: 80 mg via INTRAVENOUS

## 2022-11-12 MED ORDER — OXYCODONE HCL 5 MG PO TABS
5.0000 mg | ORAL_TABLET | Freq: Once | ORAL | Status: AC | PRN
  Administered 2022-11-12: 5 mg via ORAL

## 2022-11-12 MED ORDER — CELECOXIB 100 MG PO CAPS
100.0000 mg | ORAL_CAPSULE | Freq: Two times a day (BID) | ORAL | Status: DC
  Administered 2022-11-12 – 2022-11-15 (×6): 100 mg via ORAL
  Filled 2022-11-12 (×6): qty 1

## 2022-11-12 MED ORDER — CARBAMAZEPINE 100 MG/5ML PO SUSP
150.0000 mg | Freq: Three times a day (TID) | ORAL | Status: DC
  Administered 2022-11-12: 150 mg via ORAL
  Filled 2022-11-12: qty 7.5

## 2022-11-12 MED ORDER — VITAMIN D 25 MCG (1000 UNIT) PO TABS
2000.0000 [IU] | ORAL_TABLET | Freq: Every day | ORAL | Status: DC
  Administered 2022-11-13 – 2022-11-15 (×3): 2000 [IU] via ORAL
  Filled 2022-11-12 (×3): qty 2

## 2022-11-12 MED ORDER — SODIUM CHLORIDE 0.45 % IV SOLN: INTRAVENOUS | Status: DC

## 2022-11-12 MED ORDER — METHOCARBAMOL 500 MG PO TABS: 500.0000 mg | ORAL_TABLET | Freq: Four times a day (QID) | ORAL | Status: DC | PRN

## 2022-11-12 MED ORDER — 0.9 % SODIUM CHLORIDE (POUR BTL) OPTIME
TOPICAL | Status: DC | PRN
  Administered 2022-11-12: 500 mL

## 2022-11-12 MED ORDER — OXYCODONE HCL 5 MG/5ML PO SOLN: 5.0000 mg | Freq: Once | ORAL | Status: AC | PRN

## 2022-11-12 MED ORDER — PHENYLEPHRINE HCL-NACL 20-0.9 MG/250ML-% IV SOLN
INTRAVENOUS | Status: DC | PRN
  Administered 2022-11-12: 30 ug/min via INTRAVENOUS

## 2022-11-12 MED ORDER — ATORVASTATIN CALCIUM 20 MG PO TABS: 10.0000 mg | ORAL_TABLET | Freq: Every day | ORAL | Status: DC

## 2022-11-12 MED ORDER — ACETAMINOPHEN 10 MG/ML IV SOLN
INTRAVENOUS | Status: AC
  Filled 2022-11-12: qty 100

## 2022-11-12 MED ORDER — LAMOTRIGINE 25 MG PO TABS
200.0000 mg | ORAL_TABLET | Freq: Every day | ORAL | Status: DC
  Administered 2022-11-12: 200 mg via ORAL
  Filled 2022-11-12: qty 2

## 2022-11-12 MED ORDER — ACETAMINOPHEN 650 MG RE SUPP: 650.0000 mg | Freq: Four times a day (QID) | RECTAL | Status: DC | PRN

## 2022-11-12 MED ORDER — LEVETIRACETAM 250 MG PO TABS
250.0000 mg | ORAL_TABLET | Freq: Every day | ORAL | Status: DC
  Filled 2022-11-12: qty 1

## 2022-11-12 MED ORDER — LEVETIRACETAM 500 MG PO TABS
500.0000 mg | ORAL_TABLET | Freq: Every morning | ORAL | Status: DC
  Filled 2022-11-12: qty 1

## 2022-11-12 MED ORDER — MAGNESIUM HYDROXIDE 400 MG/5ML PO SUSP
30.0000 mL | Freq: Every day | ORAL | Status: DC | PRN
  Filled 2022-11-12: qty 30

## 2022-11-12 MED ORDER — ADULT MULTIVITAMIN W/MINERALS CH
1.0000 | ORAL_TABLET | Freq: Every day | ORAL | Status: DC
  Administered 2022-11-13 – 2022-11-15 (×3): 1 via ORAL
  Filled 2022-11-12 (×3): qty 1

## 2022-11-12 MED ORDER — SUCRALFATE 1 G PO TABS
1.0000 g | ORAL_TABLET | Freq: Two times a day (BID) | ORAL | Status: DC
  Administered 2022-11-12 – 2022-11-15 (×6): 1 g via ORAL
  Filled 2022-11-12 (×6): qty 1

## 2022-11-12 MED ORDER — ORAL CARE MOUTH RINSE: 15.0000 mL | OROMUCOSAL | Status: DC | PRN

## 2022-11-12 MED ORDER — DEXAMETHASONE SODIUM PHOSPHATE 10 MG/ML IJ SOLN
INTRAMUSCULAR | Status: DC | PRN
  Administered 2022-11-12: 10 mg via INTRAVENOUS

## 2022-11-12 MED ORDER — ALBUTEROL SULFATE HFA 108 (90 BASE) MCG/ACT IN AERS: 2.0000 | INHALATION_SPRAY | RESPIRATORY_TRACT | Status: DC | PRN

## 2022-11-12 MED ORDER — EPHEDRINE SULFATE (PRESSORS) 50 MG/ML IJ SOLN
INTRAMUSCULAR | Status: DC | PRN
  Administered 2022-11-12: 10 mg via INTRAVENOUS
  Administered 2022-11-12 (×3): 5 mg via INTRAVENOUS

## 2022-11-12 MED ORDER — SODIUM CHLORIDE (PF) 0.9 % IJ SOLN
INTRAMUSCULAR | Status: AC
  Filled 2022-11-12: qty 20

## 2022-11-12 MED ORDER — EPHEDRINE 5 MG/ML INJ
INTRAVENOUS | Status: AC
  Filled 2022-11-12: qty 5

## 2022-11-12 MED ORDER — FERROUS SULFATE 325 (65 FE) MG PO TABS
325.0000 mg | ORAL_TABLET | ORAL | Status: DC
  Administered 2022-11-14: 325 mg via ORAL
  Filled 2022-11-12: qty 1

## 2022-11-12 MED ORDER — LAMOTRIGINE 25 MG PO TABS: 100.0000 mg | ORAL_TABLET | Freq: Every day | ORAL | Status: DC

## 2022-11-12 MED ORDER — HYDROCODONE-ACETAMINOPHEN 7.5-325 MG PO TABS: 1.0000 | ORAL_TABLET | ORAL | Status: DC | PRN

## 2022-11-12 MED ORDER — ENOXAPARIN SODIUM 30 MG/0.3ML IJ SOSY: 30.0000 mg | PREFILLED_SYRINGE | INTRAMUSCULAR | Status: DC

## 2022-11-12 SURGICAL SUPPLY — 53 items
APL PRP STRL LF DISP 70% ISPRP (MISCELLANEOUS) ×2
BLADE SURG MINI STRL (BLADE) ×1 IMPLANT
BNDG CMPR 5X4 CHSV STRCH STRL (GAUZE/BANDAGES/DRESSINGS)
BNDG COHESIVE 4X5 TAN STRL LF (GAUZE/BANDAGES/DRESSINGS) IMPLANT
BNDG ESMARCH 4 X 12 STRL LF (GAUZE/BANDAGES/DRESSINGS) ×1
BNDG ESMARCH 4X12 STRL LF (GAUZE/BANDAGES/DRESSINGS) ×1 IMPLANT
CHLORAPREP W/TINT 26 (MISCELLANEOUS) ×1 IMPLANT
CUFF TOURN SGL QUICK 18X4 (TOURNIQUET CUFF) IMPLANT
DRAPE FLUOR MINI C-ARM 54X84 (DRAPES) ×1 IMPLANT
DRSG GAUZE FLUFF 36X18 (GAUZE/BANDAGES/DRESSINGS) ×1 IMPLANT
DRSG TEGADERM 4X4.75 (GAUZE/BANDAGES/DRESSINGS) IMPLANT
ELECT REM PT RETURN 9FT ADLT (ELECTROSURGICAL) ×1
ELECTRODE REM PT RTRN 9FT ADLT (ELECTROSURGICAL) ×1 IMPLANT
GAUZE SPONGE 4X4 12PLY STRL (GAUZE/BANDAGES/DRESSINGS) ×1 IMPLANT
GAUZE XEROFORM 1X8 LF (GAUZE/BANDAGES/DRESSINGS) ×1 IMPLANT
GLOVE INDICATOR 8.0 STRL GRN (GLOVE) ×1 IMPLANT
GLOVE SURG ORTHO 8.5 STRL (GLOVE) ×1 IMPLANT
GOWN STRL REUS W/ TWL LRG LVL3 (GOWN DISPOSABLE) ×1 IMPLANT
GOWN STRL REUS W/TWL LRG LVL3 (GOWN DISPOSABLE) ×1
GOWN STRL REUS W/TWL LRG LVL4 (GOWN DISPOSABLE) ×1 IMPLANT
IMMOBILIZER SHDR LG LX 900803 (SOFTGOODS) IMPLANT
K-WIRE 1.6 (WIRE) ×2
K-WIRE FX5X1.6XNS BN SS (WIRE) ×2
KIT TURNOVER KIT A (KITS) ×1 IMPLANT
KWIRE FX5X1.6XNS BN SS (WIRE) IMPLANT
MANIFOLD NEPTUNE II (INSTRUMENTS) ×1 IMPLANT
NDL SAFETY ECLIP 18X1.5 (MISCELLANEOUS) ×1 IMPLANT
NS IRRIG 500ML POUR BTL (IV SOLUTION) ×1 IMPLANT
PACK EXTREMITY ARMC (MISCELLANEOUS) ×1 IMPLANT
PADDING CAST BLEND 4X4 STRL (MISCELLANEOUS) ×2 IMPLANT
PEG FULLY THREADED 2.5X22MM (Peg) IMPLANT
PEG SUBCHONDRAL SMOOTH 2.0X16 (Peg) IMPLANT
PEG SUBCHONDRAL SMOOTH 2.0X22 (Peg) IMPLANT
PLATE STAN 24.4X59.5 RT (Plate) IMPLANT
SCREW BN 12X3.5XNS CORT TI (Screw) IMPLANT
SCREW CORT 3.5X10 LNG (Screw) IMPLANT
SCREW CORT 3.5X12 (Screw) ×2 IMPLANT
SCREW PEG LOCK 2.5X16 (Peg) IMPLANT
SCREW PEG LOCK 2.5X18 (Peg) IMPLANT
SCREW PEG LOCK 2.5X24 (Peg) IMPLANT
SPLINT CAST 1 STEP 3X12 (MISCELLANEOUS) ×1 IMPLANT
SPONGE T-LAP 18X18 ~~LOC~~+RFID (SPONGE) ×1 IMPLANT
STAPLER SKIN PROX 35W (STAPLE) ×1 IMPLANT
STOCKINETTE 48X4 2 PLY STRL (GAUZE/BANDAGES/DRESSINGS) ×1 IMPLANT
STOCKINETTE BIAS CUT 4 980044 (GAUZE/BANDAGES/DRESSINGS) ×1 IMPLANT
STOCKINETTE STRL 4IN 9604848 (GAUZE/BANDAGES/DRESSINGS) ×1 IMPLANT
SUT VIC AB 2-0 SH 27 (SUTURE) ×1
SUT VIC AB 2-0 SH 27XBRD (SUTURE) ×1 IMPLANT
SUT VIC AB 3-0 PS2 18 (SUTURE) ×1 IMPLANT
SUT VIC AB 3-0 SH 27 (SUTURE) ×1
SUT VIC AB 3-0 SH 27X BRD (SUTURE) ×1 IMPLANT
TRAP FLUID SMOKE EVACUATOR (MISCELLANEOUS) ×1 IMPLANT
WATER STERILE IRR 500ML POUR (IV SOLUTION) ×1 IMPLANT

## 2022-11-12 NOTE — Assessment & Plan Note (Signed)
CPAP.  

## 2022-11-12 NOTE — ED Provider Notes (Incomplete)
[facility] Provider Note    Event Date/Time   First MD Initiated Contact with Patient 11/11/22 2318     (approximate)   History   Fall   HPI  Marcia Brown is a 67 y.o. female with a history of COPD, stroke, seizure disorder, anxiety, and dementia who presents from a nursing facility with a right wrist injury, acute onset this evening when the patient states that she lost her balance and fell.  She states that she frequently feels a sensation of vertigo her head spinning and this has been going on for a while.  Sometimes this has caused her to fall.  She states that she was unable to grab onto something and fell hitting the right wrist.  She also states she hit her head but does not have a headache or any further dizziness.  She has no vomiting.  She denies any neck, back, or leg pain.  I reviewed the past medical records.  The patient was most recently seen by Dr. Wylene Simmer from neurology on 4/29 for follow-up of her chronic conditions.   Physical Exam   Triage Vital Signs: ED Triage Vitals  Enc Vitals Group     BP 11/11/22 2025 123/71     Pulse Rate 11/11/22 2025 60     Resp 11/11/22 2025 18     Temp 11/11/22 2025 98.5 F (36.9 C)     Temp Source 11/11/22 2025 Axillary     SpO2 11/11/22 2025 100 %     Weight --      Height 11/11/22 2026 5' (1.524 m)     Head Circumference --      Peak Flow --      Pain Score 11/11/22 2026 5     Pain Loc --      Pain Edu? --      Excl. in GC? --     Most recent vital signs: Vitals:   11/11/22 2025  BP: 123/71  Pulse: 60  Resp: 18  Temp: 98.5 F (36.9 C)  SpO2: 100%     General: Awake, no distress.  CV:  Good peripheral perfusion.  Resp:  Normal effort.  Abd:  No distention.  Other:  Right wrist deformity.  2+ radial pulse.  Normal cap refill distally.  Motor and sensory intact in median, radial, and ulnar distributions of the right hand, limited due to pain.  EOMI.  PERRLA.  Motor intact in all  extremities.  Normal coordination and speech.   ED Results / Procedures / Treatments   Labs (all labs ordered are listed, but only abnormal results are displayed) Labs Reviewed  CBC - Abnormal; Notable for the following components:      Result Value   RBC 3.44 (*)    Hemoglobin 11.7 (*)    HCT 34.1 (*)    All other components within normal limits  BASIC METABOLIC PANEL - Abnormal; Notable for the following components:   Potassium 3.2 (*)    Glucose, Bld 109 (*)    Calcium 8.6 (*)    All other components within normal limits     EKG     RADIOLOGY  XR R wrist: I independently viewed and interpreted the images; there is a distal radius and ulna fracture with displacement and angulation   PROCEDURES:  Critical Care performed: {CriticalCareYesNo:19197::"Yes, see critical care procedure note(s)","No"}  Procedures   MEDICATIONS ORDERED IN ED: Medications - No data to display   IMPRESSION / MDM /  ASSESSMENT AND PLAN / ED COURSE  I reviewed the triage vital signs and the nursing notes.                              Differential diagnosis includes, but is not limited to, ***  Patient's presentation is most consistent with {EM COPA:27473}  *** {If the patient is on the monitor, remove the brackets and asterisks on the sentence below and remember to document it as a Procedure as well. Otherwise delete the sentence below:1} {**The patient is on the cardiac monitor to evaluate for evidence of arrhythmia and/or significant heart rate changes.   FINAL CLINICAL IMPRESSION(S) / ED DIAGNOSES   Final diagnoses:  None     Rx / DC Orders   ED Discharge Orders     None        Note:  This document was prepared using Dragon voice recognition software and may include unintentional dictation errors.

## 2022-11-12 NOTE — Progress Notes (Signed)
Courtesy note- Patient is seen and examined in ED room 30. She is lying comfortably. Admitted for fall, right wrist fracture. Denies any pain at this time.   Exam- General -elderly Caucasian female, lying comfortably. HEENT PERRLA EOMI, nontender sinuses. Heart S1-S2 is heard, regular rate and rhythm. Lungs -distant breath sounds.  No wheezes. Abdomen-soft, nontender. Extremities-right upper arm splint noted. trace pedal edema.  Plan- Reviewed H&P. Orthopedic surgery team plan to take her to OR today.  Continue pain control. Continue other home meds. PT OT evaluation prior to discharge likely in 2 days. Changed level of care to Full admit.  She has dementia, but pleasant able to understand the plan.

## 2022-11-12 NOTE — Consult Note (Signed)
ORTHOPAEDIC CONSULTATION  REQUESTING PHYSICIAN: Marcelino Duster, MD  Chief Complaint: Right wrist pain and shoulder pain  HPI: Marcia Brown is a 67 y.o. female who complains of right wrist pain and shoulder pain.  The patient fell yesterday evening and presented to the [facility] emergency room with primarily complaints of the right wrist.  Exam and x-rays revealed a comminuted displaced fracture right distal radius and ulna.  She was admitted for operative fixation of the fracture.  Today she also complains of pain in the right shoulder with some swelling.  Risk and benefit benefits of shoulder wrist surgery were discussed with her and she wished to proceed.  Postop protocols were discussed.  Past Medical History:  Diagnosis Date   Anxiety    Asthma    Cerebral ischemia    COPD (chronic obstructive pulmonary disease) (HCC)    Depression    High cholesterol    Polyneuropathy    Seizures (HCC)    Sleep apnea    Past Surgical History:  Procedure Laterality Date   ESOPHAGEAL DILATION     FOOT SURGERY Right    Social History   Socioeconomic History   Marital status: Divorced    Spouse name: Not on file   Number of children: Not on file   Years of education: Not on file   Highest education level: Not on file  Occupational History   Not on file  Tobacco Use   Smoking status: Every Day    Packs/day: .5    Types: Cigarettes   Smokeless tobacco: Never  Substance and Sexual Activity   Alcohol use: No   Drug use: No   Sexual activity: Not on file  Other Topics Concern   Not on file  Social History Narrative   Not on file   Social Determinants of Health   Financial Resource Strain: Not on file  Food Insecurity: Not on file  Transportation Needs: Not on file  Physical Activity: Not on file  Stress: Not on file  Social Connections: Not on file   Family History  Problem Relation Age of Onset   Breast cancer Daughter 38   Allergies  Allergen Reactions    Ciprofloxacin Other (See Comments)    Other Reaction: Lowered seizure threshold   Aspirin Other (See Comments)    Reaction: Unknown   Prior to Admission medications   Medication Sig Start Date End Date Taking? Authorizing Provider  acetaminophen (TYLENOL) 325 MG tablet Take 650 mg by mouth in the morning, at noon, and at bedtime. Take 325 mg by mouth 3 (three) times daily GIVEN WITH TRAMADOL 0800/1400/2000   Yes [provider]  albuterol (PROVENTIL HFA;VENTOLIN HFA) 108 (90 BASE) MCG/ACT inhaler Inhale 2 puffs into the lungs every 4 (four) hours as needed for wheezing or shortness of breath.   Yes [provider]  alum & mag hydroxide-simeth (MAALOX/MYLANTA) 200-200-20 MG/5ML suspension Take 30 mLs by mouth every 6 (six) hours as needed for indigestion or heartburn.   Yes [provider]  azelastine (OPTIVAR) 0.05 % ophthalmic solution Apply 1 drop to eye 2 (two) times daily. 04/18/21  Yes [provider]  budesonide-formoterol (SYMBICORT) 160-4.5 MCG/ACT inhaler Inhale 2 puffs into the lungs 2 (two) times daily.   Yes [provider]  carbamazepine (TEGRETOL) 100 MG chewable tablet Chew 150 mg by mouth in the morning and at bedtime. 10/31/22  Yes [provider]  celecoxib (CELEBREX) 100 MG capsule Take 100 mg by mouth 2 (two) times daily.  04/21/21  Yes [provider]  clopidogrel (PLAVIX) 75 MG tablet Take 75 mg by mouth daily. 06/19/19  Yes [provider]  docusate sodium (COLACE) 50 MG capsule Take 50 mg by mouth daily.   Yes [provider]  DULoxetine (CYMBALTA) 20 MG capsule Take 40 mg by mouth 2 (two) times daily.   Yes [provider]  gabapentin (NEURONTIN) 100 MG capsule Take 200 mg by mouth at bedtime.   Yes [provider]  guaiFENesin (ROBITUSSIN) 100 MG/5ML liquid Take 15 mLs by mouth every 6 (six) hours as needed for cough.   Yes [provider]  lamoTRIgine (LAMICTAL) 100 MG  tablet Take 1.5 tablets (150 mg total) by mouth 2 (two) times daily. Patient taking differently: Take 100 mg by mouth at bedtime. 09/06/20 11/12/22 Yes Gilles Chiquito, MD  lamoTRIgine (LAMICTAL) 200 MG tablet Take 200 mg by mouth 2 (two) times daily.   Yes [provider]  levETIRAcetam (KEPPRA) 250 MG tablet Take 250 mg by mouth at bedtime. 10/31/22  Yes [provider]  levETIRAcetam (KEPPRA) 500 MG tablet Take 1 tablet (500 mg total) by mouth 2 (two) times daily. Take one tablet (500) by mouth daily in the morning and one and one-half tablets (750 mg) in the evening 09/06/20 11/12/22 Yes Gilles Chiquito, MD  loperamide (IMODIUM) 2 MG capsule Take 4 mg by mouth as needed for diarrhea or loose stools.   Yes [provider]  loratadine (CLARITIN) 10 MG tablet Take 10 mg by mouth daily.   Yes [provider]  losartan (COZAAR) 50 MG tablet Take 50 mg by mouth daily. 10/31/22  Yes [provider]  magnesium hydroxide (MILK OF MAGNESIA) 400 MG/5ML suspension Take 30 mLs by mouth daily as needed for mild constipation or moderate constipation.   Yes [provider]  Multiple Vitamin (MULTIVITAMIN WITH MINERALS) TABS tablet Take 1 tablet by mouth daily.   Yes [provider]  pantoprazole (PROTONIX) 20 MG tablet Take 20 mg by mouth daily. 10/31/22  Yes [provider]  phenytoin (DILANTIN) 100 MG ER capsule Take 100 mg by mouth 2 (two) times daily.    Yes [provider]  QUEtiapine (SEROQUEL) 25 MG tablet Take 25 mg by mouth at bedtime. 06/19/19  Yes [provider]  rosuvastatin (CRESTOR) 5 MG tablet Take 5 mg by mouth daily.   Yes [provider]  sodium chloride (OCEAN) 0.65 % nasal spray Place 2 sprays into the nose as needed. 2 Spray(s) Both Nares Twice Daily PRN for dry nose/epistaxis 08/08/22  Yes [provider]  sucralfate (CARAFATE) 1 g tablet Take 1 g by mouth 2 (two) times daily. Take on empty  stomach   Yes [provider]  traMADol (ULTRAM) 50 MG tablet Take 50 mg by mouth 3 (three) times daily. 0800/1400/2000   Yes [provider]  VIMPAT 150 MG TABS Take 1 tablet by mouth 2 (two) times daily. 06/03/19  Yes [provider]  atorvastatin (LIPITOR) 10 MG tablet Take 10 mg by mouth at bedtime. Patient not taking: Reported on 11/12/2022    [provider]  cholecalciferol (VITAMIN D) 1000 units tablet Take 2,000 Units by mouth daily. Give with Vitamin D3 400 units Patient not taking: Reported on 11/12/2022    [provider]  cholecalciferol (VITAMIN D) 400 UNITS TABS tablet Take 400 Units by mouth daily. Give with Vitamin D3 2000 units Patient not taking: Reported on 11/12/2022  [provider]  clonazePAM (KLONOPIN) 0.5 MG tablet Take 0.5 mg by mouth 3 (three) times daily. Patient not taking: Reported on 11/12/2022    [provider]  cyanocobalamin 500 MCG tablet Take 500 mcg by mouth daily. Patient not taking: Reported on 11/12/2022    [provider]  diclofenac sodium (VOLTAREN) 1 % GEL Apply 4 g topically 3 (three) times daily. Apply to both knees Patient not taking: Reported on 11/12/2022    [provider]  diphenhydrAMINE (BENADRYL) 25 MG tablet Take 25 mg by mouth every 6 (six) hours as needed for allergies. Patient not taking: Reported on 11/12/2022    [provider]  DULoxetine (CYMBALTA) 60 MG capsule Take 60 mg by mouth daily. Patient not taking: Reported on 11/12/2022    [provider]  ferrous sulfate 325 (65 FE) MG tablet Take 325 mg by mouth 3 (three) times a week. Patient not taking: Reported on 11/12/2022    [provider]  losartan (COZAAR) 25 MG tablet Take 25 mg by mouth daily. Patient not taking: Reported on 11/12/2022    [provider]  mometasone-formoterol (DULERA) 100-5 MCG/ACT AERO Inhale 2 puffs into the lungs 2 (two) times daily. Patient not taking:  Reported on 11/12/2022    [provider]  pantoprazole (PROTONIX) 40 MG tablet Take 40 mg by mouth daily before breakfast.  Patient not taking: Reported on 11/12/2022    [provider]  potassium chloride SA (KLOR-CON M20) 20 MEQ tablet Take 1 tablet (20 mEq total) by mouth daily. Patient not taking: Reported on 11/12/2022 11/28/19   Loleta Rose, MD  sucralfate (CARAFATE) 1 GM/10ML suspension Take 10 mLs (1 g total) by mouth 4 (four) times daily. Patient not taking: Reported on 11/12/2022 09/08/22   Irean Hong, MD  traMADol-acetaminophen (ULTRACET) 37.5-325 MG tablet Take 1 tablet by mouth every 8 (eight) hours as needed. Patient not taking: Reported on 11/12/2022    [provider]   CT Cervical Spine Wo Contrast  Result Date: 11/12/2022 CLINICAL DATA:  Un witnessed fall EXAM: CT CERVICAL SPINE WITHOUT CONTRAST TECHNIQUE: Multidetector CT imaging of the cervical spine was performed without intravenous contrast. Multiplanar CT image reconstructions were also generated. RADIATION DOSE REDUCTION: This exam was performed according to the departmental dose-optimization program which includes automated exposure control, adjustment of the mA and/or kV according to patient size and/or use of iterative reconstruction technique. COMPARISON:  11/01/2021 FINDINGS: Alignment: Alignment is grossly anatomic. Skull base and vertebrae: No acute fracture. No primary bone lesion or focal pathologic process. Soft tissues and spinal canal: No prevertebral fluid or swelling. No visible canal hematoma. Disc levels: Multilevel spondylosis and facet hypertrophy unchanged. Disc space narrowing most pronounced from C3-4 through C5-6. Upper chest: Airway is patent. Visualized portions of the lung apices are clear. Other: Suspected proximal right humeral fracture, though not incompletely evaluated due to field of view limitations. Correlation with physical exam findings and right humeral x-ray recommended.  IMPRESSION: 1. No acute cervical spine fracture. 2. Suspected proximal right humeral fracture, incompletely evaluated due to field of view limitations. Correlation with physical exam finding and right humeral x-ray recommended. 3. Stable multilevel cervical degenerative change. Electronically Signed   By: Sharlet Salina M.D.   On: 11/12/2022 00:49   CT Head Wo Contrast  Result Date: 11/12/2022 CLINICAL DATA:  Un witnessed fall EXAM: CT HEAD WITHOUT CONTRAST TECHNIQUE: Contiguous axial images were obtained from the base of the skull through the vertex without intravenous  contrast. RADIATION DOSE REDUCTION: This exam was performed according to the departmental dose-optimization program which includes automated exposure control, adjustment of the mA and/or kV according to patient size and/or use of iterative reconstruction technique. COMPARISON:  09/08/2022 FINDINGS: Brain: No acute infarct or hemorrhage. Lateral ventricles and midline structures are stable. No acute extra-axial fluid collections. No mass effect. Vascular: Stable atherosclerosis.  No hyperdense vessel. Skull: Normal. Negative for fracture or focal lesion. Sinuses/Orbits: No acute finding. Other: None. IMPRESSION: 1. Stable head CT, no acute intracranial process. Electronically Signed   By: Sharlet Salina M.D.   On: 11/12/2022 00:46   DG Wrist Complete Right  Result Date: 11/11/2022 CLINICAL DATA:  Fall EXAM: RIGHT WRIST - COMPLETE 3+ VIEW COMPARISON:  None Available. FINDINGS: There is an acute comminuted distal radius fracture with intra-articular extension. There is 1/2 shaft with lateral and anterior displacement of the distal fracture fragment. There is impaction with apex posterior angulation. There is also an acute fracture of the distal ulnar diaphysis with apex medial angulation. There is no definite dislocation. There is soft tissue swelling surrounding the wrist. IMPRESSION: 1. Acute comminuted displaced and angulated distal radius  fracture with intra-articular extension. 2. Acute fracture of the distal ulnar diaphysis with apex medial angulation. Electronically Signed   By: Darliss Cheney M.D.   On: 11/11/2022 20:59    Positive ROS: All other systems have been reviewed and were otherwise negative with the exception of those mentioned in the HPI and as above.  Physical Exam: General: Alert, no acute distress Cardiovascular: No pedal edema Respiratory: No cyanosis, no use of accessory musculature GI: No organomegaly, abdomen is soft and non-tender Skin: No lesions in the area of chief complaint Neurologic: Sensation intact distally Psychiatric: Patient is competent for consent with normal mood and affect Lymphatic: No axillary or cervical lymphadenopathy  MUSCULOSKELETAL: Right wrist is in a splint.  Neurovascular status good distally.  Emergency room physician is evaluated the skin which is intact.  Patient also has pain with movement of the right shoulder.  She has some swelling.  We will x-ray x-ray this as well.  Assessment: Comminuted displaced intra-articular fracture right wrist  I will fracture proximal right humerus  Plan: Open reduction internal fixation right wrist. X-rays right shoulder    Valinda Hoar, MD [phone]   11/12/2022 1:03 PM

## 2022-11-12 NOTE — Assessment & Plan Note (Signed)
Followed by neurologist, Dr. Malvin Johns, last seen 4/29

## 2022-11-12 NOTE — Assessment & Plan Note (Signed)
Continue carbamazepine 150 mg twice daily, Lamictal 200 mg every morning and 300 mg nightly, Vimpat 150 mg daily, Dilantin ER 100 mg twice daily and Keppra 500 mg every morning and 1000 mg nightly(meds per note from Dr. Malvin Johns on 4/29))

## 2022-11-12 NOTE — Assessment & Plan Note (Signed)
Fall, secondary to chronic dizzy spells Patient splinted in the ED Pain control N.p.o. Orthopedist, Dr. Hyacinth Meeker to take patient to the OR on 6/3

## 2022-11-12 NOTE — H&P (Signed)
History and Physical    Patient: Marcia Brown WGN:[id number] DOB: 1956-06-03 DOA: 11/11/2022 DOS: the patient was seen and examined on 11/12/2022 PCP: [facility]  Patient coming from: SNF  Chief Complaint:  Chief Complaint  Patient presents with   Fall    HPI: Marcia Brown is a 67 y.o. female with medical history significant for Prior CVA, COPD, temporal lobe epilepsy, dementia, anxiety and OSA as well as dizzy spells for which she last saw neurologist, Dr. Malvin Johns on 4/29 and with stable MRI on 10/03/2022 presents from the [facility] for an unwitnessed fall in which she sustained an injury to the right wrist after losing her balance.  At that time Dr. Malvin Johns felt like low blood pressure which was 103/60 could be contributing to her dizziness and he advised increasing her fluid intake among other recommendations.  Patient fell when having 1 of these dizzy spells.  She hit her head but did not lose consciousness.  She denies nausea or vomiting. ED course and data review: Vitals within normal limits.  Labs unremarkable except for mild anemia of 11.7 which is her baseline and mild hypokalemia 3.2.  EKG pending.  CT head and C-spine nonacute.  X-ray of the right wrist showing fracture. The ED provider spoke with on-call orthopedist, Dr. Hyacinth Meeker who recommended splinting with plans to take patient to the OR later today. Hospitalist consulted for admission.   Review of Systems: As mentioned in the history of present illness. All other systems reviewed and are negative.  Past Medical History:  Diagnosis Date   Anxiety    Asthma    Cerebral ischemia    COPD (chronic obstructive pulmonary disease) (HCC)    Depression    High cholesterol    Polyneuropathy    Seizures (HCC)    Sleep apnea    Past Surgical History:  Procedure Laterality Date   ESOPHAGEAL DILATION     FOOT SURGERY Right    Social History:  reports that she has been smoking cigarettes. She has been smoking an average of  .5 packs per day. She has never used smokeless tobacco. She reports that she does not drink alcohol and does not use drugs.  Allergies  Allergen Reactions   Ciprofloxacin Other (See Comments)    Other Reaction: Lowered seizure threshold   Aspirin Other (See Comments)    Reaction: Unknown    Family History  Problem Relation Age of Onset   Breast cancer Daughter 71    Prior to Admission medications   Medication Sig Start Date End Date Taking? Authorizing Provider  albuterol (PROVENTIL HFA;VENTOLIN HFA) 108 (90 BASE) MCG/ACT inhaler Inhale 2 puffs into the lungs every 4 (four) hours as needed for wheezing or shortness of breath.    [provider]  alum & mag hydroxide-simeth (MAALOX/MYLANTA) 200-200-20 MG/5ML suspension Take 30 mLs by mouth every 6 (six) hours as needed for indigestion or heartburn.    [provider]  atorvastatin (LIPITOR) 10 MG tablet Take 10 mg by mouth at bedtime.    [provider]  azelastine (OPTIVAR) 0.05 % ophthalmic solution Apply 1 drop to eye 2 (two) times daily. 04/18/21   [provider]  celecoxib (CELEBREX) 100 MG capsule Take 100 mg by mouth 2 (two) times daily. 04/21/21   [provider]  cholecalciferol (VITAMIN D) 1000 units tablet Take 2,000 Units by mouth daily. Give with Vitamin D3 400 units    [provider]  cholecalciferol (VITAMIN D) 400 UNITS TABS  tablet Take 400 Units by mouth daily. Give with Vitamin D3 2000 units    [provider]  clonazePAM (KLONOPIN) 0.5 MG tablet Take 0.5 mg by mouth 3 (three) times daily.    [provider]  clopidogrel (PLAVIX) 75 MG tablet Take 75 mg by mouth daily. 06/19/19   [provider]  cyanocobalamin 500 MCG tablet Take 500 mcg by mouth daily.    [provider]  diclofenac sodium (VOLTAREN) 1 % GEL Apply 4 g topically 3 (three) times daily. Apply to both knees    [provider]  diphenhydrAMINE (BENADRYL) 25 MG  tablet Take 25 mg by mouth every 6 (six) hours as needed for allergies.    [provider]  docusate sodium (COLACE) 50 MG capsule Take 50 mg by mouth daily.    [provider]  DULoxetine (CYMBALTA) 60 MG capsule Take 60 mg by mouth daily.    [provider]  ferrous sulfate 325 (65 FE) MG tablet Take 325 mg by mouth 3 (three) times a week.    [provider]  gabapentin (NEURONTIN) 100 MG capsule Take 200 mg by mouth 2 (two) times daily.    [provider]  lamoTRIgine (LAMICTAL) 100 MG tablet Take 1.5 tablets (150 mg total) by mouth 2 (two) times daily. 09/06/20 05/13/21  Gilles Chiquito, MD  levETIRAcetam (KEPPRA) 500 MG tablet Take 1 tablet (500 mg total) by mouth 2 (two) times daily. Take one tablet (500) by mouth daily in the morning and one and one-half tablets (750 mg) in the evening 09/06/20 05/13/21  Gilles Chiquito, MD  loperamide (IMODIUM) 2 MG capsule Take 4 mg by mouth as needed for diarrhea or loose stools.    [provider]  loratadine (CLARITIN) 10 MG tablet Take 10 mg by mouth daily.    [provider]  losartan (COZAAR) 25 MG tablet Take 25 mg by mouth daily.    [provider]  magnesium hydroxide (MILK OF MAGNESIA) 400 MG/5ML suspension Take 30 mLs by mouth daily as needed for mild constipation or moderate constipation.    [provider]  mometasone-formoterol (DULERA) 100-5 MCG/ACT AERO Inhale 2 puffs into the lungs 2 (two) times daily.    [provider]  Multiple Vitamin (MULTIVITAMIN WITH MINERALS) TABS tablet Take 1 tablet by mouth daily.    [provider]  pantoprazole (PROTONIX) 40 MG tablet Take 40 mg by mouth daily before breakfast.     [provider]  phenytoin (DILANTIN) 100 MG ER capsule Take 100 mg by mouth 2 (two) times daily.     [provider]  potassium chloride SA (KLOR-CON M20) 20 MEQ tablet Take 1 tablet (20 mEq total) by mouth daily. 11/28/19    Loleta Rose, MD  QUEtiapine (SEROQUEL) 25 MG tablet Take 25 mg by mouth at bedtime. 06/19/19   [provider]  rosuvastatin (CRESTOR) 5 MG tablet Take 5 mg by mouth daily.    [provider]  sucralfate (CARAFATE) 1 GM/10ML suspension Take 10 mLs (1 g total) by mouth 4 (four) times daily. 09/08/22   Irean Hong, MD  traMADol-acetaminophen (ULTRACET) 37.5-325 MG tablet Take 1 tablet by mouth every 8 (eight) hours as needed.    [provider]  VIMPAT 150 MG TABS Take 1 tablet by mouth 2 (two) times daily. 06/03/19   [provider]    Physical Exam: Vitals:   11/11/22 2025 11/11/22 2026 11/12/22 0056  BP: 123/71  [name])  142/72  Pulse: 60  88  Resp: 18  18  Temp: 98.5 F (36.9 C)  98.6 F (37 C)  TempSrc: Axillary  Oral  SpO2: 100%  100%  Height:  5' (1.524 m)    Physical Exam Vitals and nursing note reviewed.  Constitutional:      General: She is not in acute distress. HENT:     Head: Normocephalic and atraumatic.  Cardiovascular:     Rate and Rhythm: Normal rate and regular rhythm.     Heart sounds: Normal heart sounds.  Pulmonary:     Effort: Pulmonary effort is normal.     Breath sounds: Normal breath sounds.  Abdominal:     Palpations: Abdomen is soft.     Tenderness: There is no abdominal tenderness.  Neurological:     Mental Status: Mental status is at baseline.     Labs on Admission: I have personally reviewed following labs and imaging studies  CBC: Recent Labs  Lab 11/11/22 2123  WBC 8.1  HGB 11.7*  HCT 34.1*  MCV 99.1  PLT 198   Basic Metabolic Panel: Recent Labs  Lab 11/11/22 2123  NA 137  K 3.2*  CL 104  CO2 27  GLUCOSE 109*  BUN 14  CREATININE 0.79  CALCIUM 8.6*   GFR: CrCl cannot be calculated (Unknown ideal weight.). Liver Function Tests: No results for input(s): "AST", "ALT", "ALKPHOS", "BILITOT", "PROT", "ALBUMIN" in the last 168 hours. No results for input(s): "LIPASE", "AMYLASE" in the last 168  hours. No results for input(s): "AMMONIA" in the last 168 hours. Coagulation Profile: No results for input(s): "INR", "PROTIME" in the last 168 hours. Cardiac Enzymes: No results for input(s): "CKTOTAL", "CKMB", "CKMBINDEX", "TROPONINI" in the last 168 hours. BNP (last 3 results) No results for input(s): "PROBNP" in the last 8760 hours. HbA1C: No results for input(s): "HGBA1C" in the last 72 hours. CBG: No results for input(s): "GLUCAP" in the last 168 hours. Lipid Profile: No results for input(s): "CHOL", "HDL", "LDLCALC", "TRIG", "CHOLHDL", "LDLDIRECT" in the last 72 hours. Thyroid Function Tests: No results for input(s): "TSH", "T4TOTAL", "FREET4", "T3FREE", "THYROIDAB" in the last 72 hours. Anemia Panel: No results for input(s): "VITAMINB12", "FOLATE", "FERRITIN", "TIBC", "IRON", "RETICCTPCT" in the last 72 hours. Urine analysis:    Component Value Date/Time   COLORURINE YELLOW (A) 10/03/2022 1543   APPEARANCEUR HAZY (A) 10/03/2022 1543   APPEARANCEUR Clear 03/18/2014 1912   LABSPEC 1.010 10/03/2022 1543   LABSPEC 1.004 03/18/2014 1912   PHURINE 7.0 10/03/2022 1543   GLUCOSEU NEGATIVE 10/03/2022 1543   GLUCOSEU Negative 03/18/2014 1912   HGBUR NEGATIVE 10/03/2022 1543   BILIRUBINUR NEGATIVE 10/03/2022 1543   BILIRUBINUR Negative 03/18/2014 1912   KETONESUR NEGATIVE 10/03/2022 1543   PROTEINUR NEGATIVE 10/03/2022 1543   UROBILINOGEN 0.2 11/06/2010 0900   NITRITE NEGATIVE 10/03/2022 1543   LEUKOCYTESUR SMALL (A) 10/03/2022 1543   LEUKOCYTESUR Negative 03/18/2014 1912    Radiological Exams on Admission: CT Cervical Spine Wo Contrast  Result Date: 11/12/2022 CLINICAL DATA:  Un witnessed fall EXAM: CT CERVICAL SPINE WITHOUT CONTRAST TECHNIQUE: Multidetector CT imaging of the cervical spine was performed without intravenous contrast. Multiplanar CT image reconstructions were also generated. RADIATION DOSE REDUCTION: This exam was performed according to the departmental  dose-optimization program which includes automated exposure control, adjustment of the mA and/or kV according to patient size and/or use of iterative reconstruction technique. COMPARISON:  11/01/2021 FINDINGS: Alignment: Alignment is grossly anatomic. Skull base and vertebrae: No acute fracture. No primary  bone lesion or focal pathologic process. Soft tissues and spinal canal: No prevertebral fluid or swelling. No visible canal hematoma. Disc levels: Multilevel spondylosis and facet hypertrophy unchanged. Disc space narrowing most pronounced from C3-4 through C5-6. Upper chest: Airway is patent. Visualized portions of the lung apices are clear. Other: Suspected proximal right humeral fracture, though not incompletely evaluated due to field of view limitations. Correlation with physical exam findings and right humeral x-ray recommended. IMPRESSION: 1. No acute cervical spine fracture. 2. Suspected proximal right humeral fracture, incompletely evaluated due to field of view limitations. Correlation with physical exam finding and right humeral x-ray recommended. 3. Stable multilevel cervical degenerative change. Electronically Signed   By: Sharlet Salina M.D.   On: 11/12/2022 00:49   CT Head Wo Contrast  Result Date: 11/12/2022 CLINICAL DATA:  Un witnessed fall EXAM: CT HEAD WITHOUT CONTRAST TECHNIQUE: Contiguous axial images were obtained from the base of the skull through the vertex without intravenous contrast. RADIATION DOSE REDUCTION: This exam was performed according to the departmental dose-optimization program which includes automated exposure control, adjustment of the mA and/or kV according to patient size and/or use of iterative reconstruction technique. COMPARISON:  09/08/2022 FINDINGS: Brain: No acute infarct or hemorrhage. Lateral ventricles and midline structures are stable. No acute extra-axial fluid collections. No mass effect. Vascular: Stable atherosclerosis.  No hyperdense vessel. Skull: Normal.  Negative for fracture or focal lesion. Sinuses/Orbits: No acute finding. Other: None. IMPRESSION: 1. Stable head CT, no acute intracranial process. Electronically Signed   By: Sharlet Salina M.D.   On: 11/12/2022 00:46   DG Wrist Complete Right  Result Date: 11/11/2022 CLINICAL DATA:  Fall EXAM: RIGHT WRIST - COMPLETE 3+ VIEW COMPARISON:  None Available. FINDINGS: There is an acute comminuted distal radius fracture with intra-articular extension. There is 1/2 shaft with lateral and anterior displacement of the distal fracture fragment. There is impaction with apex posterior angulation. There is also an acute fracture of the distal ulnar diaphysis with apex medial angulation. There is no definite dislocation. There is soft tissue swelling surrounding the wrist. IMPRESSION: 1. Acute comminuted displaced and angulated distal radius fracture with intra-articular extension. 2. Acute fracture of the distal ulnar diaphysis with apex medial angulation. Electronically Signed   By: Darliss Cheney M.D.   On: 11/11/2022 20:59     Data Reviewed: Relevant notes from primary care and specialist visits, past discharge summaries as available in EHR, including Care Everywhere. Prior diagnostic testing as pertinent to current admission diagnoses Updated medications and problem lists for reconciliation ED course, including vitals, labs, imaging, treatment and response to treatment Triage notes, nursing and pharmacy notes and ED provider's notes Notable results as noted in HPI   Assessment and Plan: * Right wrist fracture, closed, initial encounter Fall, secondary to chronic dizzy spells Patient splinted in the ED Pain control N.p.o. Orthopedist, Dr. Hyacinth Meeker to take patient to the OR on 6/3  Dizzy spells Followed by neurologist, Dr. Malvin Johns, last seen 4/29  Temporal lobe epilepsy (HCC) Continue carbamazepine 150 mg twice daily, Lamictal 200 mg every morning and 300 mg nightly, Vimpat 150 mg daily, Dilantin ER 100  mg twice daily and Keppra 500 mg every morning and 1000 mg nightly(meds per note from Dr. Malvin Johns on 4/29))  Sleep apnea CPAP  Anxiety Continue Cymbalta  COPD (chronic obstructive pulmonary disease) (HCC) Stable Continue home inhalers with DuoNebs as needed  Hypokalemia Oral repletion ordered        DVT prophylaxis:SCD  Consults: Dr Hyacinth Meeker  Advance Care Planning:   Code Status: Prior   Family Communication: none  Disposition Plan: Back to previous home environment  Severity of Illness: The appropriate patient status for this patient is INPATIENT. Inpatient status is judged to be reasonable and necessary in order to provide the required intensity of service to ensure the patient's safety. The patient's presenting symptoms, physical exam findings, and initial radiographic and laboratory data in the context of their chronic comorbidities is felt to place them at high risk for further clinical deterioration. Furthermore, it is not anticipated that the patient will be medically stable for discharge from the hospital within 2 midnights of admission.   * I certify that at the point of admission it is my clinical judgment that the patient will require inpatient hospital care spanning beyond 2 midnights from the point of admission due to high intensity of service, high risk for further deterioration and high frequency of surveillance required.*  Author: Andris Baumann, MD 11/12/2022 1:46 AM  For on call review [email or web address].

## 2022-11-12 NOTE — Anesthesia Procedure Notes (Signed)
Procedure Name: LMA Insertion Date/Time: 11/12/2022 1:41 PM  Performed by: Amoreena Neubert, [place], CRNAPre-anesthesia Checklist: Patient identified, Patient being monitored, Timeout performed, Emergency Drugs available and Suction available Patient Re-evaluated:Patient Re-evaluated prior to induction Oxygen Delivery Method: Circle system utilized Preoxygenation: Pre-oxygenation with 100% oxygen Induction Type: IV induction Ventilation: Mask ventilation without difficulty LMA: LMA inserted LMA Size: 4.0 Tube type: Oral Number of attempts: 1 Placement Confirmation: positive ETCO2 and breath sounds checked- equal and bilateral Tube secured with: Tape Dental Injury: Teeth and Oropharynx as per pre-operative assessment

## 2022-11-12 NOTE — Assessment & Plan Note (Signed)
Continue Cymbalta.

## 2022-11-12 NOTE — Transfer of Care (Signed)
Immediate Anesthesia Transfer of Care Note  Patient: Marcia Brown  Procedure(s) Performed: OPEN REDUCTION INTERNAL FIXATION (ORIF) DISTAL RADIUS FRACTURE (Right: Wrist)  Patient Location: PACU  Anesthesia Type:General  Level of Consciousness: drowsy and patient cooperative  Airway & Oxygen Therapy: Patient Spontanous Breathing and Patient connected to nasal cannula oxygen  Post-op Assessment: Report given to RN and Post -op Vital signs reviewed and stable  Post vital signs: Reviewed and stable  Last Vitals:  Vitals Value Taken Time  BP 117/60 11/12/22 1558  Temp    Pulse 66 11/12/22 1558  Resp 21 11/12/22 1558  SpO2 97 % 11/12/22 1558  Vitals shown include unvalidated device data.  Last Pain:  Vitals:   11/12/22 1253  TempSrc:   PainSc: 3          Complications: No notable events documented.

## 2022-11-12 NOTE — ED Notes (Signed)
Bed alarm on, side rails up, call light within reach, pt denied socks at this time

## 2022-11-12 NOTE — Assessment & Plan Note (Signed)
Stable Continue home inhalers with DuoNebs as needed

## 2022-11-12 NOTE — Anesthesia Preprocedure Evaluation (Addendum)
Anesthesia Evaluation  Patient identified by MRN, date of birth, ID band Patient awake  General Assessment Comment:  Patient AO x 3, understands what we are fixing today. She seems a little neurologically delayed at baseline (hx of stroke, seizures), however I believe she had capacity to consent for the anesthetic. Additionally, I made multiple efforts to contact family members to no avail.  Reviewed: Allergy & Precautions, NPO status , Patient's Chart, lab work & pertinent test results  History of Anesthesia Complications Negative for: history of anesthetic complications  Airway Mallampati: III  TM Distance: >3 FB Neck ROM: Full    Dental  (+) Upper Dentures   Pulmonary asthma , sleep apnea , COPD, Current Smoker and Patient abstained from smoking.   Pulmonary exam normal breath sounds clear to auscultation       Cardiovascular Exercise Tolerance: Good METS(-) hypertension(-) CAD and (-) Past MI negative cardio ROS (-) dysrhythmias  Rhythm:Regular Rate:Normal - Systolic murmurs    Neuro/Psych Seizures -, Well Controlled,  PSYCHIATRIC DISORDERS Anxiety Depression    Residual right sided facial weakness CVA, Residual Symptoms    GI/Hepatic ,neg GERD  ,,(+)     (-) substance abuse    Endo/Other  neg diabetes    Renal/GU negative Renal ROS     Musculoskeletal   Abdominal   Peds  Hematology   Anesthesia Other Findings Past Medical History: No date: Anxiety No date: Asthma No date: Cerebral ischemia No date: COPD (chronic obstructive pulmonary disease) (HCC) No date: Depression No date: High cholesterol No date: Polyneuropathy No date: Seizures (HCC) No date: Sleep apnea  Reproductive/Obstetrics                             Anesthesia Physical Anesthesia Plan  ASA: 3  Anesthesia Plan: General   Post-op Pain Management: Ofirmev IV (intra-op)*   Induction: Intravenous  PONV Risk  Score and Plan: 2 and Ondansetron, Dexamethasone and Treatment may vary due to age or medical condition  Airway Management Planned: LMA  Additional Equipment: None  Intra-op Plan:   Post-operative Plan: Extubation in OR  Informed Consent: I have reviewed the patients History and Physical, chart, labs and discussed the procedure including the risks, benefits and alternatives for the proposed anesthesia with the patient or authorized representative who has indicated his/her understanding and acceptance.     Dental advisory given  Plan Discussed with: CRNA and Surgeon  Anesthesia Plan Comments: (Patient states she has not eaten since yesterday. I did not offer this patient a peripheral nerve block to due questions of underlying dementia and being in a long term care facility, I did not wish to contribute to post operative delierium / cognitive dysfunction. Discussed risks of anesthesia with patient, including PONV, sore throat, lip/dental/eye damage. Rare risks discussed as well, such as cardiorespiratory and neurological sequelae, and allergic reactions. Discussed the role of CRNA in patient's perioperative care. Patient understands.)       Anesthesia Quick Evaluation

## 2022-11-12 NOTE — OR Nursing (Signed)
Pt had preoperative complaint of right shoulder pain. Dr. Hyacinth Meeker took radiologic exams of the right shoulder before beginning surgery and found that there was a fracture. Plan is to put a sling on patient postoperatively.

## 2022-11-12 NOTE — Op Note (Addendum)
11/12/2022  4:21 PM  PATIENT:  Marcia Brown    PRE-OPERATIVE DIAGNOSIS: Comminuted, severely displaced right distal radius wrist fracture  Displaced transverse right humerus surgical neck fracture proximally   POST-OPERATIVE DIAGNOSIS:  Same  PROCEDURE:  OPEN REDUCTION INTERNAL FIXATION (ORIF) DISTAL RADIUS FRACTURE  Fluoroscopy and manipulation right proximal shoulder fracture  SURGEON:  Valinda Hoar, MD  TOURNIQUET TIME: 75 MIN  ANESTHESIA:   General  PREOPERATIVE INDICATIONS:  Marcia Brown is a  67 y.o. female with a diagnosis of wrist fracture who failed conservative measures and elected for surgical management.    The risks benefits and alternatives were discussed with the patient preoperatively including but not limited to the risks of infection, bleeding, nerve injury, malunion, nonunion, wrist stiffness, persistent wrist pain, osteoarthritis and the need for further surgery. Medical risks include but are not limited to DVT and pulmonary embolism, myocardial infarction, stroke, pneumonia, respiratory failure and death. Patient  understood these risks and wished to proceed.   OPERATIVE IMPLANTS: [employer] hand innovations plate, 4 hole  OPERATIVE FINDINGS: This was a severely comminuted right distal radius fracture with displacement it was intra-articular and the radial styloid was a separate fragment  OPERATIVE PROCEDURE:   Patient was seen in the preoperative area. I marked the operative hand with the word yes and my initials according the hospital's correct site of surgery protocol. Patient was then brought to the operating roomand was placed supine on the operative table and underwent general anesthesia with an LMA.   The patient was complaining of right shoulder pain which she had not been evaluated in the emergency room.  Fluoroscopy was carried out while she was asleep and this showed a displaced surgical neck fracture of the proximal humerus.  This was manipulated and.   Care was taken not to move the shoulder during the procedure is much as possible.  Following application of her wrist bandages she was placed in a elastic shoulder immobilizer to control the proximal humerus fracture  The operative arm was prepped and draped in a sterile fashion. A timeout performed to verify the patient's name, date of birth, medical record number, correct site of surgery correct procedure to be performed. The timeout was also used a timeout to verify patient received antibiotics and appropriate instruments, implants and radiographs studies were available in the room. Once all in attendance were in agreement case began.   Patient then had the operative extremity exsanguinated with an Esmarch. The tourniquet was placed on the upper extremity and inflated 250 mm.  A manual reduction of the fracture was performed. The fracture reduction was confirmed on FluoroScan imaging.  A linear incision was then made over the FCR tendon. The subcutaneous tissue was carefully dissected using Metzenbaum scissor and Adson pickup. Retractors were used to protect the radial artery and median nerve. The pronator quadratus was identified and incised and elevated off the volar surface of the distal radius. A 4 hole [employer] volar plate was then positioned on the under surface of the distal radius. It was held into position with a K wire. The position of the plate was confirmed on AP and lateral images. Once the plate was in good position a cortical screw was placed bicortically in the sliding hole. Attention was then turned to the distal pegs. The proximal row of pegs was placed first. Each individual peg hole was drilled and then measured with a depth gauge. The proximal row had 3 threaded pegs placed. The distal row  was then drilled and smooth pegs were placed. The position and length of all screws were confirmed on AP and lateral FluoroScan imaging. Care was taken to avoid penetration of any peg through  the articular surface of the distal radius.  Once all distal pegs were placed, the attention was turned back to placement of bicortical shaft screws. Additional screws were placed in the plate to fill the remaining holes. The wound was then copiously irrigated. Final FluoroScan imaging of the construct were taken. The fracture was in anatomic position and the hardware was well-positioned. The wound again was copiously irrigated. The soft tissue was then carefully over the plate. The tissues were infiltrated with 1/2% marcaine.  The skin was closed with staples. Xeroform and a dry sterile dressing were applied along with a volar splint.  The elastic shoulder immobilizer was then applied to the upper arm as well.  I was scrubbed and present for the entire case and all sharp and instrument counts were correct at the conclusion the case. The patient tolerated this procedure well and was awakened and taken to the recovery room in good condition.   Deeann Saint, MD

## 2022-11-12 NOTE — Assessment & Plan Note (Signed)
Oral repletion ordered

## 2022-11-12 NOTE — ED Notes (Addendum)
Pt confused at this time, pt appears to be hallucinating/not making sense. Unsure of pt baseline. Hospitalist notified. No new orders at this time.

## 2022-11-12 NOTE — ED Notes (Addendum)
Per facility ([facility]), pt has dementia and confusion unable to answer questions appropriately. Hospitalist notified, no new orders.

## 2022-11-12 NOTE — H&P (Signed)
THE PATIENT WAS SEEN PRIOR TO SURGERY TODAY.  HISTORY, ALLERGIES, HOME MEDICATIONS AND OPERATIVE PROCEDURE WERE REVIEWED. RISKS AND BENEFITS OF SURGERY DISCUSSED WITH PATIENT AGAIN.  NO CHANGES FROM INITIAL HISTORY AND PHYSICAL NOTED.    

## 2022-11-13 ENCOUNTER — Inpatient Hospital Stay: Payer: Medicare Other

## 2022-11-13 ENCOUNTER — Encounter: Payer: Self-pay | Admitting: Specialist

## 2022-11-13 DIAGNOSIS — E876 Hypokalemia: Secondary | ICD-10-CM

## 2022-11-13 DIAGNOSIS — G40109 Localization-related (focal) (partial) symptomatic epilepsy and epileptic syndromes with simple partial seizures, not intractable, without status epilepticus: Secondary | ICD-10-CM

## 2022-11-13 DIAGNOSIS — S52501A Unspecified fracture of the lower end of right radius, initial encounter for closed fracture: Secondary | ICD-10-CM

## 2022-11-13 DIAGNOSIS — R42 Dizziness and giddiness: Secondary | ICD-10-CM

## 2022-11-13 DIAGNOSIS — W19XXXA Unspecified fall, initial encounter: Secondary | ICD-10-CM

## 2022-11-13 LAB — CBC WITH DIFFERENTIAL/PLATELET
Abs Immature Granulocytes: 0.02 10*3/uL (ref 0.00–0.07)
Basophils Absolute: 0 10*3/uL (ref 0.0–0.1)
Basophils Relative: 0 %
Eosinophils Absolute: 0.1 10*3/uL (ref 0.0–0.5)
Eosinophils Relative: 1 %
HCT: 28.1 % — ABNORMAL LOW (ref 36.0–46.0)
Hemoglobin: 9.4 g/dL — ABNORMAL LOW (ref 12.0–15.0)
Immature Granulocytes: 0 %
Lymphocytes Relative: 26 %
Lymphs Abs: 1.6 10*3/uL (ref 0.7–4.0)
MCH: 34.2 pg — ABNORMAL HIGH (ref 26.0–34.0)
MCHC: 33.5 g/dL (ref 30.0–36.0)
MCV: 102.2 fL — ABNORMAL HIGH (ref 80.0–100.0)
Monocytes Absolute: 0.9 10*3/uL (ref 0.1–1.0)
Monocytes Relative: 14 %
Neutro Abs: 3.8 10*3/uL (ref 1.7–7.7)
Neutrophils Relative %: 59 %
Platelets: 160 10*3/uL (ref 150–400)
RBC: 2.75 MIL/uL — ABNORMAL LOW (ref 3.87–5.11)
RDW: 13.5 % (ref 11.5–15.5)
WBC: 6.4 10*3/uL (ref 4.0–10.5)
nRBC: 0 % (ref 0.0–0.2)

## 2022-11-13 LAB — BASIC METABOLIC PANEL
Anion gap: 9 (ref 5–15)
BUN: 15 mg/dL (ref 8–23)
CO2: 22 mmol/L (ref 22–32)
Calcium: 8.3 mg/dL — ABNORMAL LOW (ref 8.9–10.3)
Chloride: 105 mmol/L (ref 98–111)
Creatinine, Ser: 0.9 mg/dL (ref 0.44–1.00)
GFR, Estimated: >60 mL/min (ref >60)
Glucose, Bld: 107 mg/dL — ABNORMAL HIGH (ref 70–99)
Potassium: 4 mmol/L (ref 3.5–5.1)
Sodium: 136 mmol/L (ref 135–145)

## 2022-11-13 LAB — HIV ANTIBODY (ROUTINE TESTING W REFLEX): HIV Screen 4th Generation wRfx: NONREACTIVE

## 2022-11-13 LAB — GLUCOSE, CAPILLARY: Glucose-Capillary: 118 mg/dL — ABNORMAL HIGH (ref 70–99)

## 2022-11-13 MED ORDER — ENOXAPARIN SODIUM 40 MG/0.4ML IJ SOSY
40.0000 mg | PREFILLED_SYRINGE | INTRAMUSCULAR | Status: DC
  Administered 2022-11-13 – 2022-11-14 (×2): 40 mg via SUBCUTANEOUS
  Filled 2022-11-13 (×3): qty 0.4

## 2022-11-13 MED ORDER — LEVETIRACETAM 500 MG PO TABS
500.0000 mg | ORAL_TABLET | Freq: Every morning | ORAL | Status: DC
  Administered 2022-11-13 – 2022-11-15 (×3): 500 mg via ORAL
  Filled 2022-11-13 (×3): qty 1

## 2022-11-13 NOTE — Progress Notes (Signed)
  Progress Note   Patient: Marcia Brown ZOX:[id number] DOB: 09-28-1955 DOA: 11/11/2022     1 DOS: the patient was seen and examined on 11/13/2022   Brief hospital course: DMIA BIERL is a 67 y.o. female with a history of COPD, stroke, seizure disorder, anxiety, and dementia who presents from a nursing facility for evaluation of fall, sustained right wrist injury.  X-rays revealed right wrist fracture seen by orthopedic surgeon s/p ORIF 11/12/2022.  Assessment and Plan: * Right wrist fracture, closed, initial encounter S/p ORIF procedure 11/12/22. Will follow ortho recommendations. Patient has right arm splint, dressing. PT/ OT evaluation and follow up Pain control.  Acute on chronic anemia- Blood loss anemia-hb 9.4 dropped fron 11.7 yesterday Continue to monitor Hb level. No active bleeding.  Temporal lobe epilepsy (HCC) Continue antiepileptics carbamazepine 150 mg twice daily, Lamictal 200 mg every morning and 300 mg nightly, Vimpat 150 mg daily, Dilantin ER 100 mg twice daily and Keppra 500 mg every morning and 1000 mg nightly. Has on and off dizziness. Followed by neurologist, Dr. Malvin Johns, last seen 4/29.  Sleep apnea Continue nightly CPAP  Anxiety Continue Cymbalta  COPD (chronic obstructive pulmonary disease) (HCC) Stable, no exacerbation. Continue home inhalers with DuoNebs as needed  Hypokalemia Oral repletion ordered  DVT prophylaxis - Lovenox. Fall, aspiration and seizure precautions. Nursing supportive care.     Subjective: Patient is seen and examined today morning.  She is sitting in chair, denies any complaints of pain.  RN notified that she had a fall with head trauma, small hematoma.  CT head ordered.  Physical Exam: Vitals:   11/12/22 1819 11/13/22 0027 11/13/22 0753 11/13/22 1433  BP: (!) 99/52 (!) 144/117 117/62 (!) 110/55  Pulse: 63 99 69 69  Resp: 15 17 17 16   Temp: 98.9 F (37.2 C) 99.2 F (37.3 C) 99.2 F (37.3 C) 97.9 F (36.6 C)  TempSrc:       SpO2: 97% 96% 96% 97%  Weight:      Height:       General -elderly Caucasian female, sitting comfortably. HEENT PERRLA EOMI, nontender sinuses. Heart S1-S2 is heard, regular rate and rhythm. Lungs -distant breath sounds.  No wheezes. Abdomen-soft, nontender. Extremities-right upper arm splint, dressing noted. trace pedal edema.  Data Reviewed:  CBC Hb 9.4 dropped from 11.7, BMP, Blood sugars  Family Communication: Patient is pleasantly demented, able to understand current care plan. Unable to reach her niece over phone.  Disposition: Status is: Inpatient Remains inpatient appropriate because: post orthopedic surgery, need PT eval for safe disposition.  Planned Discharge Destination: Skilled nursing facility    Time spent: 42 minutes  Author: Marcelino Duster, MD 11/13/2022 3:43 PM  For on call review [email or web address].

## 2022-11-13 NOTE — Progress Notes (Signed)
Subjective: 1 Day Post-Op Procedure(s) (LRB): OPEN REDUCTION INTERNAL FIXATION (ORIF) DISTAL RADIUS FRACTURE (Right)   Patient is out of bed and alert.  She looks surprisingly good for the severity of her injuries.  She is already walked in the hall.  Her hemoglobin is 9.4.  The patient lives at the [facility].  I think she can probably go back there and 2 days. Fluoroscopy in the operating room and follow up postop x-rays did show a displaced surgical neck fracture of the right proximal humerus.  She is in a shoulder immobilizer.  We will plan on conservative treatment for the for now for this.  Patient reports pain as moderate.  Objective:   VITALS:   Vitals:   11/13/22 0027 11/13/22 0753  BP: (!) 144/117 117/62  Pulse: 99 69  Resp: 17 17  Temp: 99.2 F (37.3 C) 99.2 F (37.3 C)  SpO2: 96% 96%    Neurologically intact Incision: dressing C/D/I Some swelling in the fingers.  I have encouraged her to work on aggressive range of motion.  LABS Recent Labs    11/11/22 2123 11/13/22 0441  HGB 11.7* 9.4*  HCT 34.1* 28.1*  WBC 8.1 6.4  PLT 198 160    Recent Labs    11/11/22 2123 11/13/22 0441  NA 137 136  K 3.2* 4.0  BUN 14 15  CREATININE 0.79 0.90  GLUCOSE 109* 107*    No results for input(s): "LABPT", "INR" in the last 72 hours.   Assessment/Plan: 1 Day Post-Op Procedure(s) (LRB): OPEN REDUCTION INTERNAL FIXATION (ORIF) DISTAL RADIUS FRACTURE (Right)   Advance diet Up with therapy Discharge home with home health when stable. Will follow-up in my office in 5 to 7 days.

## 2022-11-13 NOTE — Evaluation (Signed)
Physical Therapy Evaluation Patient Details Name: Marcia Brown MRN: [id number] DOB: 10/14/1955 Today's Date: 11/13/2022  History of Present Illness  Pt admitted for R wrist fracture in addition to humeral head fx secondary to fall. Pt is now POD 1 s/p ORIF of radius and has shoulder immobilized via brace. HIstory includes anxiety, asthma, COPD, depression, seizures, and dementia.  Clinical Impression  Pt is a pleasant 67 year old female who was admitted for R wrist fx and humeral head fx. Pt is POD 1 s/p ORIF. Pt in shoulder immobilizer and needs frequent reminders about WBing status as she often attempts to use R UE.  Pt performs bed mobility with mod I, transfers with cga, and ambulation with cga and varying level of assist ranging from no assist to pushing IV pole. Pt demonstrates deficits with cognition/safety awareness and pain. Pt is very close to baseline level. Does complain of dizziness with change of position. Orthostatics obtained, however negative. Pt unable to adequately describe dizziness, however reports multiple falls at ALF. Would benefit from skilled PT to address above deficits and promote optimal return to PLOF. Pt will continue to receive skilled PT services while admitted and will defer to TOC/care team for updates regarding disposition planning.  Orthostatic VS for the past 24 hrs:  BP- Lying Pulse- Lying BP- Sitting Pulse- Sitting BP- Standing at 0 minutes Pulse- Standing at 0 minutes  11/13/22 1300 111/54 67 116/71 74 111/67 84         Recommendations for follow up therapy are one component of a multi-disciplinary discharge planning process, led by the attending physician.  Recommendations may be updated based on patient status, additional functional criteria and insurance authorization.  Follow Up Recommendations       Assistance Recommended at Discharge Intermittent Supervision/Assistance  Patient can return home with the following  A little help with walking and/or  transfers;A little help with bathing/dressing/bathroom;Help with stairs or ramp for entrance;Assist for transportation    Equipment Recommendations None recommended by PT  Recommendations for Other Services       Functional Status Assessment Patient has had a recent decline in their functional status and demonstrates the ability to make significant improvements in function in a reasonable and predictable amount of time.     Precautions / Restrictions Precautions Precautions: Fall Required Braces or Orthoses: Sling Restrictions Weight Bearing Restrictions: Yes RUE Weight Bearing: Non weight bearing      Mobility  Bed Mobility Overal bed mobility: Modified Independent             General bed mobility comments: safe technique and reaches for bed rail during transition to EOB. Once seated, upright posture noted    Transfers Overall transfer level: Needs assistance Equipment used: 1 person hand held assist Transfers: Sit to/from Stand Sit to Stand: Min guard           General transfer comment: cues given for sequencing and reminders for correct UE WBing status    Ambulation/Gait Ambulation/Gait assistance: Min guard Gait Distance (Feet): 130 Feet Assistive device: IV Pole Gait Pattern/deviations: Step-through pattern       General Gait Details: ambulated in hallway with reciprocal gait pattern. Does tend to reach for IV pole inconsistency. Easily distracted during gait training and has unsteadiness with head changes.  Stairs            Wheelchair Mobility    Modified Rankin (Stroke Patients Only)       Balance Overall balance assessment: Needs assistance, History of  Falls Sitting-balance support: Feet supported Sitting balance-Leahy Scale: Good     Standing balance support: Single extremity supported Standing balance-Leahy Scale: Fair                               Pertinent Vitals/Pain Pain Assessment Pain Assessment:  Faces Faces Pain Scale: Hurts little more Pain Location: R shoulder Pain Descriptors / Indicators: Operative site guarding Pain Intervention(s): Limited activity within patient's tolerance, Premedicated before session, Repositioned, Monitored during session    Home Living Family/patient expects to be discharged to:: Assisted living                 Home Equipment: Rolling Walker (2 wheels) Additional Comments: pt reports she has lived at [facility] for many years.    Prior Function Prior Level of Function : Independent/Modified Independent;History of Falls (last six months)             Mobility Comments: reports using RW at all times while at ALF. Reports multiple falls due to dizziness       Hand Dominance   Dominant Hand: Left    Extremity/Trunk Assessment   Upper Extremity Assessment Upper Extremity Assessment:  (R UE not tested secondary to injury and in sling. L UE appears WNL)    Lower Extremity Assessment Lower Extremity Assessment: Overall WFL for tasks assessed       Communication   Communication: No difficulties  Cognition Arousal/Alertness: Awake/alert Behavior During Therapy: WFL for tasks assessed/performed Overall Cognitive Status: History of cognitive impairments - at baseline                                 General Comments: pleasant, however demonstrates confusion and decreased carry over between OT and PT session        General Comments      Exercises     Assessment/Plan    PT Assessment Patient needs continued PT services  PT Problem List Decreased strength;Decreased balance;Decreased mobility;Decreased knowledge of use of DME;Decreased safety awareness;Pain       PT Treatment Interventions DME instruction;Gait training;Stair training;Therapeutic exercise;Balance training    PT Goals (Current goals can be found in the Care Plan section)  Acute Rehab PT Goals Patient Stated Goal: to go home PT Goal Formulation:  With patient Time For Goal Achievement: 11/27/22 Potential to Achieve Goals: Good    Frequency 7X/week     Co-evaluation               AM-PAC PT "6 Clicks" Mobility  Outcome Measure Help needed turning from your back to your side while in a flat bed without using bedrails?: None Help needed moving from lying on your back to sitting on the side of a flat bed without using bedrails?: A Little Help needed moving to and from a bed to a chair (including a wheelchair)?: A Little Help needed standing up from a chair using your arms (e.g., wheelchair or bedside chair)?: A Little Help needed to walk in hospital room?: A Little Help needed climbing 3-5 steps with a railing? : A Little 6 Click Score: 19    End of Session Equipment Utilized During Treatment: Gait belt Activity Tolerance: Patient tolerated treatment well Patient left: in chair;with chair alarm set Nurse Communication: Mobility status PT Visit Diagnosis: History of falling (Z91.81);Difficulty in walking, not elsewhere classified (R26.2);Pain;Unsteadiness on feet (R26.81) Pain -  Right/Left: Right Pain - part of body: Arm    Time: 5621-3086 PT Time Calculation (min) (ACUTE ONLY): 16 min   Charges:   PT Evaluation $PT Eval Low Complexity: 1 Low PT Treatments $Gait Training: 8-22 mins        Elizabeth Palau, PT, DPT, GCS [phone]   Marcia Brown 11/13/2022, 1:43 PM

## 2022-11-13 NOTE — Progress Notes (Signed)
PHARMACIST - PHYSICIAN COMMUNICATION  CONCERNING:  Enoxaparin (Lovenox) for DVT Prophylaxis    RECOMMENDATION: Patient was prescribed enoxaprin 40mg  q24 hours for VTE prophylaxis.   Filed Weights   11/12/22 1253  Weight: 68 kg (150 lb)    Body mass index is 27.44 kg/m.  Estimated Creatinine Clearance: 54.1 mL/min (by C-G formula based on SCr of 0.9 mg/dL).   Based on [facility] policy patient is candidate for enoxaparin 0.5mg /kg TBW SQ every 24 hours based on BMI being >30.   DESCRIPTION: Pharmacy has adjusted enoxaparin dose per [facility] policy.  Patient is now receiving enoxaparin 40 mg every 24 hours    Sharen Hones, PharmD Clinical Pharmacist  11/13/2022 9:03 AM

## 2022-11-13 NOTE — Evaluation (Signed)
Occupational Therapy Evaluation Patient Details Name: Marcia Brown MRN: [id number] DOB: [birth year] Today's Date: 11/13/2022   History of Present Illness Pt admitted for R wrist fracture in addition to humeral head fx secondary to fall. Pt is now POD 1 s/p ORIF of radius and has shoulder immobilized via brace. HIstory includes anxiety, asthma, COPD, depression, seizures, and dementia.   Clinical Impression   Patient presenting with decreased Ind in self care, balance, functional mobility/transfers, endurance, and safety awareness. Patient reports living at [facility] for ~ 16 years. Pt reports being Ind at baseline for ambulation and self care tasks. She endorses ambulating to dining room for meals, to play bingo, and for arts and crafts. Pt needing cuing to remain NWB on R UE. She is oriented to situation, day of the week, year, month, and location. She needs min guard HHA for balance in room with functional mobility. OT anticipates pt will likely need much more help with self care tasks secondary to precautions/restrictions of movement.  Patient will benefit from acute OT to increase overall independence in the areas of ADLs, functional mobility, and safety awareness in order to safely discharge.     Recommendations for follow up therapy are one component of a multi-disciplinary discharge planning process, led by the attending physician.  Recommendations may be updated based on patient status, additional functional criteria and insurance authorization.   Assistance Recommended at Discharge Frequent or constant Supervision/Assistance  Patient can return home with the following A little help with walking and/or transfers;A little help with bathing/dressing/bathroom;Assistance with cooking/housework;Assist for transportation;Help with stairs or ramp for entrance    Functional Status Assessment  Patient has had a recent decline in their functional status and demonstrates the ability to  make significant improvements in function in a reasonable and predictable amount of time.  Equipment Recommendations  None recommended by OT       Precautions / Restrictions Precautions Precautions: Fall Required Braces or Orthoses: Sling Restrictions Weight Bearing Restrictions: Yes RUE Weight Bearing: Non weight bearing      Mobility Bed Mobility Overal bed mobility: Modified Independent             General bed mobility comments: increased time and effort but no physical assistance    Transfers Overall transfer level: Needs assistance Equipment used: 1 person hand held assist Transfers: Sit to/from Stand, Bed to chair/wheelchair/BSC Sit to Stand: Min guard     Step pivot transfers: Min guard            Balance Overall balance assessment: Needs assistance, History of Falls Sitting-balance support: Feet supported Sitting balance-Leahy Scale: Good     Standing balance support: Single extremity supported Standing balance-Leahy Scale: Fair                             ADL either performed or assessed with clinical judgement   ADL Overall ADL's : Needs assistance/impaired Eating/Feeding: Set up;Sitting   Grooming: Sitting;Set up                   Toilet Transfer: Min guard;Ambulation Toilet Transfer Details (indicate cue type and reason): simulated                 Vision Patient Visual Report: No change from baseline              Pertinent Vitals/Pain Pain Assessment Pain Assessment: Faces Faces Pain Scale: Hurts a little bit  Pain Location: R shoulder Pain Descriptors / Indicators: Operative site guarding Pain Intervention(s): Limited activity within patient's tolerance, Premedicated before session, Monitored during session     Hand Dominance Left   Extremity/Trunk Assessment Upper Extremity Assessment Upper Extremity Assessment: RUE deficits/detail RUE Deficits / Details: shoulder in sling with NWB status and  therefore not tested.  L UE WFLs for strength and AROM.   Lower Extremity Assessment Lower Extremity Assessment: Defer to PT evaluation       Communication Communication Communication: No difficulties   Cognition Arousal/Alertness: Awake/alert Behavior During Therapy: WFL for tasks assessed/performed Overall Cognitive Status: History of cognitive impairments - at baseline                                                  Home Living Family/patient expects to be discharged to:: Assisted living                             Home Equipment: Rolling Walker (2 wheels)   Additional Comments: pt reports she has lived at [facility] for many years.      Prior Functioning/Environment Prior Level of Function : Independent/Modified Independent;History of Falls (last six months)             Mobility Comments: reports using RW at all times while at ALF. Reports multiple falls due to dizziness          OT Problem List: Decreased strength;Pain;Decreased activity tolerance;Decreased safety awareness;Impaired balance (sitting and/or standing);Decreased knowledge of use of DME or AE;Decreased knowledge of precautions;Impaired UE functional use      OT Treatment/Interventions: Self-care/ADL training;Therapeutic exercise;Therapeutic activities;Energy conservation;DME and/or AE instruction;Patient/family education;Balance training    OT Goals(Current goals can be found in the care plan section) Acute Rehab OT Goals Patient Stated Goal: to get better and return to the [facility] OT Goal Formulation: With patient Time For Goal Achievement: 11/27/22 Potential to Achieve Goals: Fair ADL Goals Pt Will Perform Upper Body Dressing: with supervision Pt Will Transfer to Toilet: with supervision;ambulating Pt Will Perform Toileting - Clothing Manipulation and hygiene: with supervision;sit to/from stand Pt Will Perform Tub/Shower Transfer: with supervision;ambulating  OT  Frequency: Min 2X/week       AM-PAC OT "6 Clicks" Daily Activity     Outcome Measure Help from another person eating meals?: None Help from another person taking care of personal grooming?: A Little Help from another person toileting, which includes using toliet, bedpan, or urinal?: A Little Help from another person bathing (including washing, rinsing, drying)?: A Little Help from another person to put on and taking off regular upper body clothing?: A Little Help from another person to put on and taking off regular lower body clothing?: A Little 6 Click Score: 19   End of Session Nurse Communication: Mobility status  Activity Tolerance: Patient tolerated treatment well Patient left: in bed;with call bell/phone within reach;with bed alarm set;with nursing/sitter in room  OT Visit Diagnosis: Unsteadiness on feet (R26.81);Repeated falls (R29.6);Muscle weakness (generalized) (M62.81);History of falling (Z91.81)                Time: 1053-1106 OT Time Calculation (min): 13 min Charges:  OT General Charges $OT Visit: 1 Visit OT Evaluation $OT Eval Low Complexity: 1 Low OT Treatments $Therapeutic Activity: 8-22 mins  Jackquline Denmark, MS,  OTR/L , CBIS ascom [phone]  11/13/22, 2:33 PM

## 2022-11-13 NOTE — Progress Notes (Signed)
   11/13/22 1800  What Happened  Was fall witnessed? No  Was patient injured? No  Patient found on floor  Found by Staff-comment (Bet)  Stated prior activity other (comment) (sitting in recliner chair)  Provider Notification  Provider Name/Title Marcelino Duster, MD  Date Provider Notified 11/13/22  Time Provider Notified 1715  Method of Notification  (secure chat)  Notification Reason Fall  Provider response No new orders  Date of Provider Response 11/13/22  Time of Provider Response 1715  Follow Up  Family notified Yes - comment  Time family notified 1720  Additional tests Yes-comment (CT scan)  Simple treatment Other (comment) (none needed)  Progress note created (see row info) Yes  Blank note created Yes  Adult Fall Risk Assessment  Risk Factor Category (scoring not indicated) High fall risk per protocol (document High fall risk)  Patient Fall Risk Level High fall risk  Adult Fall Risk Interventions  Required Bundle Interventions *See Row Information* High fall risk - low, moderate, and high requirements implemented  Additional Interventions Use of appropriate toileting equipment (bedpan, BSC, etc.);Safety Sitter/Safety Rounder  Fall intervention(s) refused/Patient educated regarding refusal Nonskid socks;Bed alarm  Screening for Fall Injury Risk (To be completed on HIGH fall risk patients) - Assessing Need for Floor Mats  Risk For Fall Injury- Criteria for Floor Mats Admitted as a result of a fall  Will Implement Floor Mats Yes  Pain Assessment  Pain Scale 0-10  Pain Score 0  Neurological  Neuro (WDL) X  Level of Consciousness Alert  Orientation Level Disoriented to place;Disoriented to situation  Cognition Impulsive;Poor attention/concentration;Poor International aid/development worker Clear  R Pupil Size (mm) 3  R Pupil Shape Round  R Pupil Reaction Brisk  L Pupil Size (mm) 3  L Pupil Shape Round  L Pupil Reaction Brisk  Motor Function/Sensation  Assessment Sensation  RUE Sensation Numbness;Tingling  LUE Sensation Full sensation  RLE Sensation Full sensation  LLE Sensation Full sensation  Neuro Symptoms Forgetful  Glasgow Coma Scale  Eye Opening 4  Best Verbal Response (NON-intubated) 4  Best Motor Response 6  Glasgow Coma Scale Score 14  Musculoskeletal  Musculoskeletal (WDL) X  Assistive Device BSC;Front wheel walker  Generalized Weakness Yes  Weight Bearing Restrictions Yes  RUE Weight Bearing NWB  Integumentary  Integumentary (WDL) X  Skin Color Appropriate for ethnicity  Skin Condition Dry  Skin Integrity Other (Comment)  Ecchymosis Location Arm  Ecchymosis Location Orientation Upper;Right  Skin Turgor Non-tenting

## 2022-11-14 DIAGNOSIS — S62101A Fracture of unspecified carpal bone, right wrist, initial encounter for closed fracture: Secondary | ICD-10-CM | POA: Diagnosis not present

## 2022-11-14 LAB — CBC WITH DIFFERENTIAL/PLATELET
Abs Immature Granulocytes: 0.02 10*3/uL (ref 0.00–0.07)
Basophils Absolute: 0 10*3/uL (ref 0.0–0.1)
Basophils Relative: 0 %
Eosinophils Absolute: 0.1 10*3/uL (ref 0.0–0.5)
Eosinophils Relative: 2 %
HCT: 28.7 % — ABNORMAL LOW (ref 36.0–46.0)
Hemoglobin: 9.5 g/dL — ABNORMAL LOW (ref 12.0–15.0)
Immature Granulocytes: 0 %
Lymphocytes Relative: 15 %
Lymphs Abs: 0.8 10*3/uL (ref 0.7–4.0)
MCH: 33.9 pg (ref 26.0–34.0)
MCHC: 33.1 g/dL (ref 30.0–36.0)
MCV: 102.5 fL — ABNORMAL HIGH (ref 80.0–100.0)
Monocytes Absolute: 0.4 10*3/uL (ref 0.1–1.0)
Monocytes Relative: 7 %
Neutro Abs: 3.8 10*3/uL (ref 1.7–7.7)
Neutrophils Relative %: 76 %
Platelets: 156 10*3/uL (ref 150–400)
RBC: 2.8 MIL/uL — ABNORMAL LOW (ref 3.87–5.11)
RDW: 13.2 % (ref 11.5–15.5)
WBC: 5.1 10*3/uL (ref 4.0–10.5)
nRBC: 0 % (ref 0.0–0.2)

## 2022-11-14 NOTE — TOC Progression Note (Signed)
Transition of Care [facility]) - Progression Note    Patient Details  Name: Marcia Brown MRN: [id number] Date of Birth: 1955/12/15  Transition [facility]) CM/SW Contact  Marlowe Sax, RN Phone Number: 11/14/2022, 3:28 PM  Clinical Narrative:   Met with the patient and she has been open getting care from [facility] and plans to continue at [facility] where she resides Transition of Care [facility]) - Inpatient Brief Assessment   Patient Details  Name: Marcia Brown MRN: [id number] Date of Birth: Sep 23, 1955  Transition of Care [facility]) CM/SW Contact:    Marlowe Sax, RN Phone Number: 11/14/2022, 3:29 PM   Clinical Narrative:    Transition of Care Asessment: Insurance and Status: Insurance coverage has been reviewed Patient has primary care physician: Yes Home environment has been reviewed: lives at ALF [facility] Prior level of function:: Mod Assistance Prior/Current Home Services: Current home services Social Determinants of Health Reivew: SDOH reviewed no interventions necessary Readmission risk has been reviewed: Yes Transition of care needs: no transition of care needs at this time         Expected Discharge Plan and Services                                               Social Determinants of Health (SDOH) Interventions SDOH Screenings   Food Insecurity: No Food Insecurity (11/13/2022)  Housing: Low Risk  (11/13/2022)  Transportation Needs: No Transportation Needs (11/13/2022)  Utilities: Not At Risk (11/13/2022)  Tobacco Use: High Risk (11/13/2022)    Readmission Risk Interventions     No data to display

## 2022-11-14 NOTE — Progress Notes (Signed)
Physical Therapy Treatment Patient Details Name: Marcia Brown MRN: [id number] DOB: Sep 03, 1955 Today's Date: 11/14/2022   History of Present Illness Pt admitted for R wrist fracture in addition to humeral head fx secondary to fall. Pt is now POD 1 s/p ORIF of radius and has shoulder immobilized via brace. HIstory includes anxiety, asthma, COPD, depression, seizures, and dementia.    PT Comments      Patient laying in bed upon arrival. AOx1 for person, SPT verbalized where the patient was and why. Opened blinds to increase awareness to time throughout the day. Min guard needed for bed mobility and getting to EOB. Patient reminded of R shoulder precautions but unable to verbalize understanding. 1 HHA used for sit to stand and ambulation. Chair follow when ambulating in the hallway due to fatigue. Consistent veering to the right during ambulation. Ambulated 130 feet in the hallway with chair follow, rest break needed at 130 ft. Patient wheeled back to room and left in chair with chair alarm and call bell in reach. Fall mats replaced back on floor at end of session.    Recommendations for follow up therapy are one component of a multi-disciplinary discharge planning process, led by the attending physician.  Recommendations may be updated based on patient status, additional functional criteria and insurance authorization.  Follow Up Recommendations       Assistance Recommended at Discharge Intermittent Supervision/Assistance  Patient can return home with the following A little help with walking and/or transfers;A little help with bathing/dressing/bathroom;Help with stairs or ramp for entrance;Assist for transportation   Equipment Recommendations  None recommended by PT    Recommendations for Other Services       Precautions / Restrictions Precautions Precautions: Fall Required Braces or Orthoses: Sling Restrictions Weight Bearing Restrictions: Yes RUE Weight Bearing: Non weight bearing      Mobility  Bed Mobility Overal bed mobility: Modified Independent             General bed mobility comments: reaches for bed rail with L UE during transition to EOB. Verbal cueing needed for scooting to EOB so her feet were firmly on the floor.    Transfers Overall transfer level: Needs assistance Equipment used: 1 person hand held assist Transfers: Sit to/from Stand Sit to Stand: Min guard           General transfer comment: cues given for sequencing and reminders for correct UE WBing status    Ambulation/Gait Ambulation/Gait assistance: Min guard Gait Distance (Feet): 130 Feet   Gait Pattern/deviations: Step-through pattern, Shuffle, Staggering right, Drifts right/left       General Gait Details: Ambulated in hallway with intermittent changes in gait pattern. Patient varied between shuffle gait and step through pattern. Consistent vearing to the right which was corrected with PT guarding on the right.   Stairs             Wheelchair Mobility    Modified Rankin (Stroke Patients Only)       Balance Overall balance assessment: Needs assistance, History of Falls Sitting-balance support: Feet supported Sitting balance-Leahy Scale: Good Sitting balance - Comments: Sitting EOB with L UE support, guarding with R UE.   Standing balance support: Single extremity supported Standing balance-Leahy Scale: Fair Standing balance comment: 1 HHA assist and/or min guard needed. intermttent leaning to the right.                            Cognition Arousal/Alertness: Administrator, Civil Service  Behavior During Therapy: Restless Overall Cognitive Status: History of cognitive impairments - at baseline                                 General Comments: pleasant, however demonstrates confusion to place and time. Does not remember previous sessions and current precautions.        Exercises      General Comments        Pertinent Vitals/Pain Pain  Assessment Pain Assessment: Faces Faces Pain Scale: Hurts a little bit Pain Location: R shoulder Pain Descriptors / Indicators: Operative site guarding Pain Intervention(s): Limited activity within patient's tolerance, Monitored during session, Relaxation    Home Living                          Prior Function            PT Goals (current goals can now be found in the care plan section) Acute Rehab PT Goals Patient Stated Goal: to go home PT Goal Formulation: With patient Time For Goal Achievement: 11/27/22 Potential to Achieve Goals: Good Progress towards PT goals: Progressing toward goals    Frequency    7X/week      PT Plan Current plan remains appropriate    Co-evaluation              AM-PAC PT "6 Clicks" Mobility   Outcome Measure  Help needed turning from your back to your side while in a flat bed without using bedrails?: None Help needed moving from lying on your back to sitting on the side of a flat bed without using bedrails?: A Little Help needed moving to and from a bed to a chair (including a wheelchair)?: A Little Help needed standing up from a chair using your arms (e.g., wheelchair or bedside chair)?: A Little Help needed to walk in hospital room?: A Little Help needed climbing 3-5 steps with a railing? : A Little 6 Click Score: 19    End of Session Equipment Utilized During Treatment: Gait belt Activity Tolerance: Patient tolerated treatment well Patient left: in chair;with chair alarm set Nurse Communication: Mobility status PT Visit Diagnosis: History of falling (Z91.81);Difficulty in walking, not elsewhere classified (R26.2);Pain;Unsteadiness on feet (R26.81) Pain - Right/Left: Right Pain - part of body: Arm     Time: 1610-9604 PT Time Calculation (min) (ACUTE ONLY): 25 min  Charges:                        Malachi Carl, SPT    Malachi Carl 11/14/2022, 12:25 PM

## 2022-11-14 NOTE — Progress Notes (Signed)
  Progress Note   Patient: Marcia Brown VWU:[id number] DOB: May 20, 1956 DOA: 11/11/2022     2 DOS: the patient was seen and examined on 11/14/2022   Brief hospital course: Marcia Brown is a 67 y.o. female with a history of COPD, stroke, seizure disorder, anxiety, and dementia who presents from a nursing facility for evaluation of fall, sustained right wrist injury.  X-rays revealed right wrist fracture seen by orthopedic surgeon s/p ORIF 11/12/2022.    Assessment and Plan: * Right wrist fracture, closed, initial encounter S/p ORIF procedure 11/12/22. Patient has  a displaced surgical neck fracture of the right proximal humerus.  She is in a shoulder immobilizer.   Continue conservative treatment for the for now for this. Continue PT/ OT evaluation  Pain control.    Acute on chronic anemia- 11.7 >> 9.4 Blood loss anemia- stable Continue to monitor Hb level.    Temporal lobe epilepsy (HCC) Continue antiepileptics carbamazepine 150 mg twice daily, Lamictal 200 mg every morning and 300 mg nightly, Vimpat 150 mg daily, Dilantin ER 100 mg twice daily and Keppra 500 mg every morning and 1000 mg nightly. Has on and off dizziness. Followed by neurologist, Dr. Malvin Johns, last seen 4/29.    Sleep apnea Continue nightly CPAP   Anxiety Continue Cymbalta   COPD (chronic obstructive pulmonary disease) (HCC) Stable, no exacerbation. Continue home inhalers with DuoNebs as needed   Hypokalemia Oral repletion ordered   DVT prophylaxis - Lovenox. Fall, aspiration and seizure precautions. Nursing supportive care.          Subjective: Patient is seen and examined at the bedside.  Sitting up in a chair and has no new complaints  Physical Exam: Vitals:   11/13/22 1433 11/13/22 1554 11/13/22 2120 11/14/22 0756  BP: (!) 110/55 (!) 95/54 (!) 114/59 (!) 114/53  Pulse: 69 75 75 68  Resp: 16 16 20 16   Temp: 97.9 F (36.6 C) 99 F (37.2 C) 98.6 F (37 C) 98.3 F (36.8 C)  TempSrc:  Oral     SpO2: 97% 97% 97% 100%  Weight:      Height:       General -elderly Caucasian female, sitting comfortably. HEENT PERRLA EOMI, nontender sinuses. Heart S1-S2 is heard, regular rate and rhythm. Lungs -distant breath sounds.  No wheezes. Abdomen-soft, nontender. Extremities-right upper arm sling, dressing noted. trace pedal edema.  Data Reviewed: Labs reviewed.  Hemoglobin is stable at 9.5 There are no new results to review at this time.  Family Communication: None  Disposition: Status is: Inpatient Remains inpatient appropriate because: Awaiting placement  Planned Discharge Destination: Skilled nursing facility    Time spent: 30 minutes  Author: Lucile Shutters, MD 11/14/2022 12:40 PM  For on call review [email or web address].

## 2022-11-14 NOTE — Progress Notes (Signed)
Subjective: 2 Days Post-Op Procedure(s) (LRB): OPEN REDUCTION INTERNAL FIXATION (ORIF) DISTAL RADIUS FRACTURE (Right) Patient is alert, awake and oriented.  She is sitting up in a chair eating lunch.  She still has some moderate swelling of the fingers and limited active movement due to pain.  Passive movement is good.  Sensation is good.  Dressings are dry.  Shoulder swelling is unchanged.  Hemoglobin is stable at 9.5.  Plans are for her to go to skilled nursing facility.  Patient reports pain as moderate.  Objective:   VITALS:   Vitals:   11/13/22 2120 11/14/22 0756  BP: (!) 114/59 (!) 114/53  Pulse: 75 68  Resp: 20 16  Temp: 98.6 F (37 C) 98.3 F (36.8 C)  SpO2: 97% 100%    Neurologically intact Incision: dressing C/D/I  LABS Recent Labs    11/11/22 2123 11/13/22 0441 11/14/22 1059  HGB 11.7* 9.4* 9.5*  HCT 34.1* 28.1* 28.7*  WBC 8.1 6.4 5.1  PLT 198 160 156    Recent Labs    11/11/22 2123 11/13/22 0441  NA 137 136  K 3.2* 4.0  BUN 14 15  CREATININE 0.79 0.90  GLUCOSE 109* 107*    No results for input(s): "LABPT", "INR" in the last 72 hours.   Assessment/Plan: 2 Days Post-Op Procedure(s) (LRB): OPEN REDUCTION INTERNAL FIXATION (ORIF) DISTAL RADIUS FRACTURE (Right)   Up with therapy Discharge to SNF when available. Follow-up in my office 5 days. OT needs to work aggressively with movement of the fingers.

## 2022-11-15 DIAGNOSIS — S62101A Fracture of unspecified carpal bone, right wrist, initial encounter for closed fracture: Secondary | ICD-10-CM | POA: Diagnosis not present

## 2022-11-15 NOTE — Care Management Important Message (Signed)
Important Message  Patient Details  Name: Marcia Brown MRN: [id number] Date of Birth: May 23, 1956   Medicare Important Message Given:  Other (see comment)  Left message for Niece, Chauncey Cruel, [phone] asking for a return call to review Important Message from Medicare. Will await a return call.   Merrin A Matalynn Graff 11/15/2022, 10:09 AM

## 2022-11-15 NOTE — [place] FL2 (Signed)
[place] MEDICAID FL2 LEVEL OF CARE FORM     IDENTIFICATION  Patient Name: Marcia Brown Birthdate: Oct 09, 1955 Sex: female Admission Date (Current Location): 11/11/2022  [place] and [place] Number:  [address] [id number] Q Facility and Address:  [facility], [address], [place], [place] [address]      Provider Number: [id number]  Attending Physician Name and Address:  Lucile Shutters, MD  Relative Name and Phone Number:  Chauncey Cruel  [phone]    Current Level of Care: Hospital Recommended Level of Care: Assisted Living Facility Prior Approval Number:    Date Approved/Denied:   PASRR Number:    Discharge Plan:  (Assisted Living)    Current Diagnoses: Patient Active Problem List   Diagnosis Date Noted   Fall 11/13/2022   Closed fracture of right distal radius 11/13/2022   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   Right wrist fracture, closed, initial encounter 11/12/2022   COPD (chronic obstructive pulmonary disease) (HCC) 11/12/2022   Anxiety 11/12/2022   Sleep apnea 11/12/2022   Dizzy spells 11/12/2022   Wrist fracture, closed, right, initial encounter 11/12/2022   Seizures (HCC) 08/04/2019   Acute lower UTI 06/26/2019   Hypokalemia 06/26/2019   AMS (altered mental status) 06/26/2019   Tobacco abuse 12/27/2015   Repeated falls 05/24/2015   Seizure (HCC) 02/03/2015   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    Orientation RESPIRATION BLADDER Height & Weight     Self, Time, Situation  Normal Incontinent Weight: 68 kg Height:  5\' 2"  (157.5 cm)  BEHAVIORAL SYMPTOMS/MOOD NEUROLOGICAL BOWEL NUTRITION STATUS    Convulsions/Seizures Incontinent  (See Discharge Summary)  AMBULATORY STATUS COMMUNICATION OF NEEDS Skin   Extensive Assist Verbally Normal                       Personal Care Assistance Level of Assistance  Bathing, Feeding, Dressing Bathing Assistance: Maximum assistance Feeding assistance: Limited assistance Dressing Assistance:  Maximum assistance     Functional Limitations Info  Sight, Hearing, Speech Sight Info: Adequate Hearing Info: Adequate Speech Info: Adequate    SPECIAL CARE FACTORS FREQUENCY  PT (By licensed PT), OT (By licensed OT)     PT Frequency: 3x weekly OT Frequency: 3x weekly            Contractures Contractures Info: Not present    Additional Factors Info  Code Status, Allergies Code Status Info: Full Code Allergies Info: Ciprofloxacin, Aspirin           Current Medications (11/15/2022):  This is the current hospital active medication list Current Facility-Administered Medications  Medication Dose Route Frequency Provider Last Rate Last Admin   0.45 % sodium chloride infusion   Intravenous Continuous Deeann Saint, MD 75 mL/hr at 11/12/22 1856 New Bag at 11/12/22 1856   0.9 %  sodium chloride infusion   Intravenous Continuous Deeann Saint, MD 75 mL/hr at 11/13/22 2320 New Bag at 11/13/22 2320   acetaminophen (TYLENOL) tablet 650 mg  650 mg Oral Q6H PRN Deeann Saint, MD       Or   acetaminophen (TYLENOL) suppository 650 mg  650 mg Rectal Q6H PRN Deeann Saint, MD       albuterol (PROVENTIL) (2.5 MG/3ML) 0.083% nebulizer solution 2.5 mg  2.5 mg Nebulization Q4H PRN Deeann Saint, MD       ALPRAZolam Prudy Feeler) tablet 0.5 mg  0.5 mg Oral Q6H PRN Deeann Saint, MD       alum & mag hydroxide-simeth (MAALOX/MYLANTA) 200-200-20 MG/5ML suspension 30 mL  30 mL Oral Q6H PRN Deeann Saint, MD       carbamazepine (TEGRETOL) chewable tablet 150 mg  150 mg Oral BID Mansy, Jan A, MD   150 mg at 11/15/22 1610   celecoxib (CELEBREX) capsule 100 mg  100 mg Oral BID Deeann Saint, MD   100 mg at 11/15/22 0932   cholecalciferol (VITAMIN D3) 10 MCG/ML oral liquid 400 Units  400 Units Oral Daily Deeann Saint, MD   400 Units at 11/14/22 9604   cholecalciferol (VITAMIN D3) 25 MCG (1000 UNIT) tablet 2,000 Units  2,000 Units Oral Daily Deeann Saint, MD   2,000 Units at 11/15/22 5409    clopidogrel (PLAVIX) tablet 75 mg  75 mg Oral Daily Deeann Saint, MD   75 mg at 11/15/22 0930   cyanocobalamin (VITAMIN B12) tablet 500 mcg  500 mcg Oral Daily Deeann Saint, MD   500 mcg at 11/15/22 0930   diphenhydrAMINE (BENADRYL) capsule 25 mg  25 mg Oral Q6H PRN Deeann Saint, MD       docusate (COLACE) 50 MG/5ML liquid 50 mg  50 mg Oral Daily Deeann Saint, MD   50 mg at 11/15/22 0932   DULoxetine (CYMBALTA) DR capsule 40 mg  40 mg Oral BID Deeann Saint, MD   40 mg at 11/15/22 0933   enoxaparin (LOVENOX) injection 40 mg  40 mg Subcutaneous Q24H Sharen Hones, RPH   40 mg at 11/14/22 0955   famotidine (PEPCID) tablet 20 mg  20 mg Oral BID PRN Deeann Saint, MD       ferrous sulfate tablet 325 mg  325 mg Oral Once per day on Mon Wed Fri Miller, Howard, MD   325 mg at 11/14/22 1001   gabapentin (NEURONTIN) capsule 200 mg  200 mg Oral QHS Deeann Saint, MD   200 mg at 11/14/22 2222   guaiFENesin (ROBITUSSIN) 100 MG/5ML liquid 15 mL  15 mL Oral Q6H PRN Deeann Saint, MD       HYDROcodone-acetaminophen (NORCO) 7.5-325 MG per tablet 1-2 tablet  1-2 tablet Oral Q4H PRN Deeann Saint, MD       HYDROcodone-acetaminophen (NORCO/VICODIN) 5-325 MG per tablet 1-2 tablet  1-2 tablet Oral Q4H PRN Deeann Saint, MD       ketorolac (TORADOL) 30 MG/ML injection 30 mg  30 mg Intravenous Q6H PRN Deeann Saint, MD       lacosamide (VIMPAT) tablet 150 mg  150 mg Oral BID Deeann Saint, MD   150 mg at 11/15/22 8119   lamoTRIgine (LAMICTAL) tablet 200 mg  200 mg Oral q morning Marcelino Duster, MD   200 mg at 11/15/22 1478   lamoTRIgine (LAMICTAL) tablet 300 mg  300 mg Oral QHS Marcelino Duster, MD   300 mg at 11/14/22 2226   levETIRAcetam (KEPPRA) tablet 1,000 mg  1,000 mg Oral QHS Mansy, Jan A, MD   1,000 mg at 11/14/22 2225   levETIRAcetam (KEPPRA) tablet 500 mg  500 mg Oral q morning Marcelino Duster, MD   500 mg at 11/15/22 0932   loperamide (IMODIUM) capsule 4 mg  4 mg Oral PRN  Deeann Saint, MD       loratadine (CLARITIN) tablet 10 mg  10 mg Oral Daily Deeann Saint, MD   10 mg at 11/15/22 0930   losartan (COZAAR) tablet 50 mg  50 mg Oral Daily Deeann Saint, MD   50 mg at 11/15/22 0930   magnesium hydroxide (MILK OF MAGNESIA) suspension 30 mL  30 mL Oral Daily PRN  Deeann Saint, MD       methocarbamol (ROBAXIN) tablet 500 mg  500 mg Oral Q6H PRN Deeann Saint, MD       Or   methocarbamol (ROBAXIN) 500 mg in dextrose 5 % 50 mL IVPB  500 mg Intravenous Q6H PRN Deeann Saint, MD       mometasone-formoterol [facility]) 100-5 MCG/ACT inhaler 2 puff  2 puff Inhalation BID Deeann Saint, MD   2 puff at 11/15/22 0933   morphine (PF) 2 MG/ML injection 1 mg  1 mg Intravenous Q2H PRN Deeann Saint, MD   1 mg at 11/12/22 0145   morphine (PF) 2 MG/ML injection 2 mg  2 mg Intravenous Q2H PRN Deeann Saint, MD       multivitamin with minerals tablet 1 tablet  1 tablet Oral Daily Deeann Saint, MD   1 tablet at 11/15/22 0930   ondansetron (ZOFRAN) tablet 4 mg  4 mg Oral Q6H PRN Deeann Saint, MD       Or   ondansetron [facility]) injection 4 mg  4 mg Intravenous Q6H PRN Deeann Saint, MD       Oral care mouth rinse  15 mL Mouth Rinse PRN Marcelino Duster, MD       pantoprazole (PROTONIX) EC tablet 40 mg  40 mg Oral QAC breakfast Deeann Saint, MD   40 mg at 11/15/22 0930   phenytoin (DILANTIN) ER capsule 100 mg  100 mg Oral BID Deeann Saint, MD   100 mg at 11/15/22 0934   potassium chloride SA (KLOR-CON M) CR tablet 20 mEq  20 mEq Oral Daily Deeann Saint, MD   20 mEq at 11/15/22 0930   QUEtiapine (SEROQUEL) tablet 25 mg  25 mg Oral Mariel Kansky, MD   25 mg at 11/14/22 2223   rosuvastatin (CRESTOR) tablet 5 mg  5 mg Oral QHS Deeann Saint, MD   5 mg at 11/14/22 2223   sodium chloride (OCEAN) 0.65 % nasal spray 2 spray  2 spray Nasal PRN Deeann Saint, MD       sucralfate (CARAFATE) tablet 1 g  1 g Oral BID Deeann Saint, MD   1 g at 11/15/22 0930   traMADol  (ULTRAM) tablet 50 mg  50 mg Oral TID Deeann Saint, MD   50 mg at 11/15/22 0930   zolpidem (AMBIEN) tablet 5 mg  5 mg Oral QHS PRN Deeann Saint, MD         Discharge Medications:  TAKE these medications     acetaminophen 325 MG tablet Commonly known as: TYLENOL Take 650 mg by mouth in the morning, at noon, and at bedtime. Take 325 mg by mouth 3 (three) times daily GIVEN WITH TRAMADOL 0800/1400/2000    albuterol 108 (90 Base) MCG/ACT inhaler Commonly known as: VENTOLIN HFA Inhale 2 puffs into the lungs every 4 (four) hours as needed for wheezing or shortness of breath.    alum & mag hydroxide-simeth 200-200-20 MG/5ML suspension Commonly known as: MAALOX/MYLANTA Take 30 mLs by mouth every 6 (six) hours as needed for indigestion or heartburn.    azelastine 0.05 % ophthalmic solution Commonly known as: OPTIVAR Apply 1 drop to eye 2 (two) times daily.    budesonide-formoterol 160-4.5 MCG/ACT inhaler Commonly known as: SYMBICORT Inhale 2 puffs into the lungs 2 (two) times daily.    carbamazepine 100 MG chewable tablet Commonly known as: TEGRETOL Chew 150 mg by mouth in the morning and at bedtime.    celecoxib 100 MG capsule Commonly known  as: CELEBREX Take 100 mg by mouth 2 (two) times daily.    cholecalciferol 10 MCG (400 UNIT) Tabs tablet Commonly known as: VITAMIN D3 Take 400 Units by mouth daily. Give with Vitamin D3 2000 units What changed: Another medication with the same name was removed. Continue taking this medication, and follow the directions you see here.    clopidogrel 75 MG tablet Commonly known as: PLAVIX Take 75 mg by mouth daily.    docusate sodium 50 MG capsule Commonly known as: COLACE Take 50 mg by mouth daily.    DULoxetine 20 MG capsule Commonly known as: CYMBALTA Take 40 mg by mouth 2 (two) times daily. What changed: Another medication with the same name was removed. Continue taking this medication, and follow the directions you see here.     gabapentin 100 MG capsule Commonly known as: NEURONTIN Take 200 mg by mouth at bedtime.    guaiFENesin 100 MG/5ML liquid Commonly known as: ROBITUSSIN Take 15 mLs by mouth every 6 (six) hours as needed for cough.    lamoTRIgine 200 MG tablet Commonly known as: LAMICTAL Take 200 mg by mouth 2 (two) times daily. What changed: Another medication with the same name was changed. Make sure you understand how and when to take each.    lamoTRIgine 100 MG tablet Commonly known as: LaMICtal Take 1.5 tablets (150 mg total) by mouth 2 (two) times daily. What changed:  how much to take when to take this    levETIRAcetam 500 MG tablet Commonly known as: KEPPRA Take 1 tablet (500 mg total) by mouth 2 (two) times daily. Take one tablet (500) by mouth daily in the morning and one and one-half tablets (750 mg) in the evening    levETIRAcetam 250 MG tablet Commonly known as: KEPPRA Take 250 mg by mouth at bedtime.    loperamide 2 MG capsule Commonly known as: IMODIUM Take 4 mg by mouth as needed for diarrhea or loose stools.    loratadine 10 MG tablet Commonly known as: CLARITIN Take 10 mg by mouth daily.    losartan 50 MG tablet Commonly known as: COZAAR Take 50 mg by mouth daily. What changed: Another medication with the same name was removed. Continue taking this medication, and follow the directions you see here.    magnesium hydroxide 400 MG/5ML suspension Commonly known as: MILK OF MAGNESIA Take 30 mLs by mouth daily as needed for mild constipation or moderate constipation.    multivitamin with minerals Tabs tablet Take 1 tablet by mouth daily.    pantoprazole 20 MG tablet Commonly known as: PROTONIX Take 20 mg by mouth daily. What changed: Another medication with the same name was removed. Continue taking this medication, and follow the directions you see here.    phenytoin 100 MG ER capsule Commonly known as: DILANTIN Take 100 mg by mouth 2 (two) times daily.     QUEtiapine 25 MG tablet Commonly known as: SEROQUEL Take 25 mg by mouth at bedtime.    rosuvastatin 5 MG tablet Commonly known as: CRESTOR Take 5 mg by mouth daily.    sodium chloride 0.65 % nasal spray Commonly known as: OCEAN Place 2 sprays into the nose as needed. 2 Spray(s) Both Nares Twice Daily PRN for dry nose/epistaxis    sucralfate 1 g tablet Commonly known as: CARAFATE Take 1 g by mouth 2 (two) times daily. Take on empty stomach What changed: Another medication with the same name was removed. Continue taking this medication, and follow the  directions you see here.    traMADol 50 MG tablet Commonly known as: ULTRAM Take 50 mg by mouth 3 (three) times daily. 0800/1400/2000    Vimpat 150 MG Tabs Generic drug: Lacosamide Take 1 tablet by mouth 2 (two) times daily.            Relevant Imaging Results:  Relevant Lab Results:   Additional Information [id number]  Garret Reddish, RN

## 2022-11-15 NOTE — TOC Transition Note (Signed)
Transition of Care [facility]) - CM/SW Discharge Note   Patient Details  Name: Marcia Brown MRN: [id number] Date of Birth: 05-14-1956  [facility] CM/SW Contact:  Garret Reddish, RN Phone Number: 11/15/2022, 9:55 AM   Clinical Narrative:   Chart reviewed.  Noted that patient will be a discharge back to facility today.    I have spoken with Amalia Hailey at the [facility].  He reports that patient is able to return to the facility today.   Amalia Hailey reports that the facility will be able to transport patient today at  10 am.  Amalia Hailey reports that the number to call report is [phone].  I have sent Dustin's patient's FL2, Discharge Summary, SNF transfer report and Discharge orders.    Nurse currently at bedside.  Nurse to inform patient that facility will transport to facility today.    I have attempted to call patient's niece  to inform about discharge today and I was unable to leave a voice mail.    I have informed staff nurse of the above information.            Final next level of care: Assisted Living Barriers to Discharge: No Barriers Identified   Patient Goals and CMS Choice CMS Medicare.gov Compare Post Acute Care list provided to:: Patient Choice offered to / list presented to : Patient  Discharge Placement                Patient chooses bed at:  ([facility]) Patient to be transferred to facility by: [facility] transport   Patient and family notified of of transfer: 11/15/22  Discharge Plan and Services Additional resources added to the After Visit Summary for                            [facility] Arranged: RN, PT, OT [facility] Agency: [facility] Date [facility] Agency Contacted: 11/15/22 Time HH Agency Contacted: [phone] Representative spoke with at [facility] Agency: Amy  Social Determinants of Health (SDOH) Interventions SDOH Screenings   Food Insecurity: No Food Insecurity (11/13/2022)  Housing: Low Risk  (11/13/2022)   Transportation Needs: No Transportation Needs (11/13/2022)  Utilities: Not At Risk (11/13/2022)  Tobacco Use: High Risk (11/13/2022)     Readmission Risk Interventions     No data to display

## 2022-11-15 NOTE — Discharge Instructions (Addendum)
Leave shoulder immobilizer in place for displaced proximal right humeral neck fracture

## 2022-11-15 NOTE — Discharge Summary (Addendum)
Physician Discharge Summary   Patient: Marcia Brown MRN: [id number] DOB: [birth year]  Admit date:     11/11/2022  Discharge date: 11/15/22  Discharge Physician: Kecia Swoboda   PCP: [facility]   Recommendations at discharge:    Fall precautions Follow up with orthopedic surgery as an out patient  Discharge Diagnoses: Principal Problem:   Right wrist fracture, closed, initial encounter Active Problems:   Dizzy spells   Temporal lobe epilepsy (HCC)   Hypokalemia   COPD (chronic obstructive pulmonary disease) (HCC)   Anxiety   Sleep apnea   Wrist fracture, closed, right, initial encounter   Fall   Closed fracture of right distal radius  Resolved Problems:   * No resolved hospital problems. *  Hospital Course: Marcia Brown is a 67 y.o. female with medical history significant for Prior CVA, COPD, temporal lobe epilepsy, dementia, anxiety and OSA as well as dizzy spells for which she last saw neurologist, Dr. Malvin Johns on 4/29 and with stable MRI on 10/03/2022 presents from the [facility] assisted living facility for evaluation following an unwitnessed fall in which she sustained an injury to the right wrist after losing her balance.  At that time Dr. Malvin Johns felt like low blood pressure which was 103/60 could be contributing to her dizziness and he advised increasing her fluid intake among other recommendations.  Patient fell when having 1 of these dizzy spells.  She hit her head but did not lose consciousness.  She denies nausea or vomiting. ED course and data review: Vitals within normal limits.  Labs unremarkable except for mild anemia of 11.7 which is her baseline and mild hypokalemia 3.2.  EKG pending.  CT head and C-spine nonacute.  X-ray of the right wrist showing fracture. The ED provider spoke with on-call orthopedist, Dr. Hyacinth Meeker who recommended splinting with plans to take patient to the OR later today. Hospitalist consulted for admission.    Assessment and  Plan:    Progress Note     Patient: Marcia Brown YQM:[id number] DOB: [birth year] DOA: 11/11/2022     2 DOS: the patient was seen and examined on 11/14/2022   Brief hospital course: LANYA WHYNOT is a 67 y.o. female with a history of COPD, stroke, seizure disorder, anxiety, and dementia who presents from a nursing facility for evaluation of fall, sustained right wrist injury.  X-rays revealed right wrist fracture seen by orthopedic surgeon s/p ORIF 11/12/2022.     Assessment and Plan: * Right wrist fracture, closed, initial encounter S/p ORIF procedure 11/12/22. Patient has  a displaced surgical neck fracture of the right proximal humerus.  She is in a shoulder immobilizer.   Continue conservative treatment for the for now for this. Appreciate PT/OT.  Patient will be discharged back to assisted living facility and will require aggressive PT and OT Pain control. Follow-up with orthopedic surgery in 5 days Continue shoulder immobilizer     Acute on chronic anemia- 11.7 >> 9.4 Blood loss anemia- stable Continue to monitor Hb level.     Temporal lobe epilepsy (HCC) Continue antiepileptics carbamazepine 150 mg twice daily, Lamictal 200 mg every morning and 300 mg nightly, Vimpat 150 mg daily, Dilantin ER 100 mg twice daily and Keppra 500 mg every morning and 1000 mg nightly. Has on and off dizziness. Followed by neurologist, Dr. Malvin Johns, last seen 4/29. Follow-up with neurologist on outpatient     Sleep apnea Continue nightly CPAP   Anxiety Continue Cymbalta   COPD (chronic obstructive pulmonary disease) (  HCC) Stable, no exacerbation. Continue home inhalers with DuoNebs as needed   Hypokalemia Supplemented   Hypertension Patient has been normotensive No orthostatic BP changes Will discontinue Losartan   Patient was seen and examined at bedside prior to her discharge and is in stable condition.  Please see vital signs below.          Consultants: Orthopedic  surgery Procedures performed: ORIF for distal radius fracture Disposition: Assisted living Diet recommendation:  Discharge Diet Orders (From admission, onward)     Start     Ordered   11/15/22 0000  Diet - low sodium heart healthy        11/15/22 0858           Cardiac diet DISCHARGE MEDICATION: Allergies as of 11/15/2022       Reactions   Ciprofloxacin Other (See Comments)   Other Reaction: Lowered seizure threshold   Aspirin Other (See Comments)   Reaction: Unknown        Medication List     STOP taking these medications    atorvastatin 10 MG tablet Commonly known as: LIPITOR   clonazePAM 0.5 MG tablet Commonly known as: KLONOPIN   cyanocobalamin 500 MCG tablet Commonly known as: VITAMIN B12   diclofenac sodium 1 % Gel Commonly known as: VOLTAREN   diphenhydrAMINE 25 MG tablet Commonly known as: BENADRYL   ferrous sulfate 325 (65 FE) MG tablet   losartan 25 MG tablet Commonly known as: COZAAR   losartan 50 MG tablet Commonly known as: COZAAR   mometasone-formoterol 100-5 MCG/ACT Aero Commonly known as: DULERA   potassium chloride SA 20 MEQ tablet Commonly known as: Klor-Con M20   traMADol-acetaminophen 37.5-325 MG tablet Commonly known as: ULTRACET       TAKE these medications    acetaminophen 325 MG tablet Commonly known as: TYLENOL Take 650 mg by mouth in the morning, at noon, and at bedtime. Take 325 mg by mouth 3 (three) times daily GIVEN WITH TRAMADOL 0800/1400/2000   albuterol 108 (90 Base) MCG/ACT inhaler Commonly known as: VENTOLIN HFA Inhale 2 puffs into the lungs every 4 (four) hours as needed for wheezing or shortness of breath.   alum & mag hydroxide-simeth 200-200-20 MG/5ML suspension Commonly known as: MAALOX/MYLANTA Take 30 mLs by mouth every 6 (six) hours as needed for indigestion or heartburn.   azelastine 0.05 % ophthalmic solution Commonly known as: OPTIVAR Apply 1 drop to eye 2 (two) times daily.    budesonide-formoterol 160-4.5 MCG/ACT inhaler Commonly known as: SYMBICORT Inhale 2 puffs into the lungs 2 (two) times daily.   carbamazepine 100 MG chewable tablet Commonly known as: TEGRETOL Chew 150 mg by mouth in the morning and at bedtime.   celecoxib 100 MG capsule Commonly known as: CELEBREX Take 100 mg by mouth 2 (two) times daily.   cholecalciferol 10 MCG (400 UNIT) Tabs tablet Commonly known as: VITAMIN D3 Take 400 Units by mouth daily. Give with Vitamin D3 2000 units What changed: Another medication with the same name was removed. Continue taking this medication, and follow the directions you see here.   clopidogrel 75 MG tablet Commonly known as: PLAVIX Take 75 mg by mouth daily.   docusate sodium 50 MG capsule Commonly known as: COLACE Take 50 mg by mouth daily.   DULoxetine 20 MG capsule Commonly known as: CYMBALTA Take 40 mg by mouth 2 (two) times daily. What changed: Another medication with the same name was removed. Continue taking this medication, and follow the directions you  see here.   gabapentin 100 MG capsule Commonly known as: NEURONTIN Take 200 mg by mouth at bedtime.   guaiFENesin 100 MG/5ML liquid Commonly known as: ROBITUSSIN Take 15 mLs by mouth every 6 (six) hours as needed for cough.   lamoTRIgine 200 MG tablet Commonly known as: LAMICTAL Take 200 mg by mouth 2 (two) times daily. What changed: Another medication with the same name was changed. Make sure you understand how and when to take each.   lamoTRIgine 100 MG tablet Commonly known as: LaMICtal Take 1.5 tablets (150 mg total) by mouth 2 (two) times daily. What changed:  how much to take when to take this   levETIRAcetam 500 MG tablet Commonly known as: KEPPRA Take 1 tablet (500 mg total) by mouth 2 (two) times daily. Take one tablet (500) by mouth daily in the morning and one and one-half tablets (750 mg) in the evening   levETIRAcetam 250 MG tablet Commonly known as:  KEPPRA Take 250 mg by mouth at bedtime.   loperamide 2 MG capsule Commonly known as: IMODIUM Take 4 mg by mouth as needed for diarrhea or loose stools.   loratadine 10 MG tablet Commonly known as: CLARITIN Take 10 mg by mouth daily.   magnesium hydroxide 400 MG/5ML suspension Commonly known as: MILK OF MAGNESIA Take 30 mLs by mouth daily as needed for mild constipation or moderate constipation.   multivitamin with minerals Tabs tablet Take 1 tablet by mouth daily.   pantoprazole 20 MG tablet Commonly known as: PROTONIX Take 20 mg by mouth daily. What changed: Another medication with the same name was removed. Continue taking this medication, and follow the directions you see here.   phenytoin 100 MG ER capsule Commonly known as: DILANTIN Take 100 mg by mouth 2 (two) times daily.   QUEtiapine 25 MG tablet Commonly known as: SEROQUEL Take 25 mg by mouth at bedtime.   rosuvastatin 5 MG tablet Commonly known as: CRESTOR Take 5 mg by mouth daily.   sodium chloride 0.65 % nasal spray Commonly known as: OCEAN Place 2 sprays into the nose as needed. 2 Spray(s) Both Nares Twice Daily PRN for dry nose/epistaxis   sucralfate 1 g tablet Commonly known as: CARAFATE Take 1 g by mouth 2 (two) times daily. Take on empty stomach What changed: Another medication with the same name was removed. Continue taking this medication, and follow the directions you see here.   traMADol 50 MG tablet Commonly known as: ULTRAM Take 50 mg by mouth 3 (three) times daily. 0800/1400/2000   Vimpat 150 MG Tabs Generic drug: Lacosamide Take 1 tablet by mouth 2 (two) times daily.        Contact information for follow-up providers     Deeann Saint, MD Follow up in 5 day(s).   Specialty: Orthopedic Surgery Contact information: [address] [place] [address] [phone]         Morene Crocker, MD Follow up in 7 day(s).   Specialty: Neurology Contact information: [phone]  [facility] [address] [facility] [address] [place] [address] [phone]         [facility] Follow up in 7 day(s).   Specialty: Geriatric Medicine Contact information: [address] [place] [address] [phone]              Contact information for after-discharge care     Destination     [facility] .   Service: Assisted Living Contact  information: [address] [place] [address] [phone]                    Discharge Exam: Ceasar Mons Weights   11/12/22 1253  Weight: 68 kg   General -elderly Caucasian female, sitting comfortably. HEENT PERRLA EOMI, nontender sinuses. Heart S1-S2 is heard, regular rate and rhythm. Lungs -distant breath sounds.  No wheezes. Abdomen-soft, nontender. Extremities-right upper arm sling, dressing noted. trace pedal edema.    Condition at discharge: stable  The results of significant diagnostics from this hospitalization (including imaging, microbiology, ancillary and laboratory) are listed below for reference.   Imaging Studies: CT HEAD WO CONTRAST ( )  Result Date: 11/13/2022 CLINICAL DATA:  Head trauma, minor (Age >= 65y) EXAM: CT HEAD WITHOUT CONTRAST TECHNIQUE: Contiguous axial images were obtained from the base of the skull through the vertex without intravenous contrast. RADIATION DOSE REDUCTION: This exam was performed according to the departmental dose-optimization program which includes automated exposure control, adjustment of the mA and/or kV according to patient size and/or use of iterative reconstruction technique. COMPARISON:  CT Head 11/12/22 FINDINGS: Brain: No hemorrhage. No CT evidence of an acute cortical infarct. There is encephalomalacia in the right middle cranial fossa, likely related to prior trauma or postsurgical change. No extra-axial fluid collection. No hydrocephalus. Sequela of mild overall chronic microvascular  ischemic change. Vascular: No hyperdense vessel or unexpected calcification. Skull: Prior right pterional craniotomy. Possible sequela of prior trauma to the right zygomatic arch and right lateral orbit. Sinuses/Orbits: No middle ear or mastoid effusion. Paranasal sinuses are clear bilateral lens replacement. Orbits are otherwise unremarkable. Other: None. IMPRESSION: No acute intracranial abnormality. Electronically Signed   By: Lorenza Cambridge M.D.   On: 11/13/2022 20:26   DG Shoulder Right  Result Date: 11/12/2022 CLINICAL DATA:  Proximal humeral fracture EXAM: RIGHT SHOULDER - 3 VIEW COMPARISON:  Chest x-ray 10/03/2022 FINDINGS: Displaced and angulated humeral neck fracture identified. Fracture lines are comminuted. No dislocation. Preserved bone mineralization. Old right-sided rib fractures are seen at the edge of the imaging field. Mild osteophyte formation along the AC joint. IMPRESSION: Displaced and angulated humeral neck fracture. Mild degenerative changes of the [facility] joint. Electronically Signed   By: Karen Kays M.D.   On: 11/12/2022 18:02   DG MINI C-ARM IMAGE ONLY  Result Date: 11/12/2022 There is no interpretation for this exam.  This order is for images obtained during a surgical procedure.  Please See "Surgeries" Tab for more information regarding the procedure.   CT Cervical Spine Wo Contrast  Result Date: 11/12/2022 CLINICAL DATA:  Un witnessed fall EXAM: CT CERVICAL SPINE WITHOUT CONTRAST TECHNIQUE: Multidetector CT imaging of the cervical spine was performed without intravenous contrast. Multiplanar CT image reconstructions were also generated. RADIATION DOSE REDUCTION: This exam was performed according to the departmental dose-optimization program which includes automated exposure control, adjustment of the mA and/or kV according to patient size and/or use of iterative reconstruction technique. COMPARISON:  11/01/2021 FINDINGS: Alignment: Alignment is grossly anatomic. Skull base and  vertebrae: No acute fracture. No primary bone lesion or focal pathologic process. Soft tissues and spinal canal: No prevertebral fluid or swelling. No visible canal hematoma. Disc levels: Multilevel spondylosis and facet hypertrophy unchanged. Disc space narrowing most pronounced from C3-4 through C5-6. Upper chest: Airway is patent. Visualized portions of the lung apices are clear. Other: Suspected proximal right humeral fracture, though not incompletely evaluated due to field of view limitations. Correlation with physical exam findings and right humeral  x-ray recommended. IMPRESSION: 1. No acute cervical spine fracture. 2. Suspected proximal right humeral fracture, incompletely evaluated due to field of view limitations. Correlation with physical exam finding and right humeral x-ray recommended. 3. Stable multilevel cervical degenerative change. Electronically Signed   By: Sharlet Salina M.D.   On: 11/12/2022 00:49   CT Head Wo Contrast  Result Date: 11/12/2022 CLINICAL DATA:  Un witnessed fall EXAM: CT HEAD WITHOUT CONTRAST TECHNIQUE: Contiguous axial images were obtained from the base of the skull through the vertex without intravenous contrast. RADIATION DOSE REDUCTION: This exam was performed according to the departmental dose-optimization program which includes automated exposure control, adjustment of the mA and/or kV according to patient size and/or use of iterative reconstruction technique. COMPARISON:  09/08/2022 FINDINGS: Brain: No acute infarct or hemorrhage. Lateral ventricles and midline structures are stable. No acute extra-axial fluid collections. No mass effect. Vascular: Stable atherosclerosis.  No hyperdense vessel. Skull: Normal. Negative for fracture or focal lesion. Sinuses/Orbits: No acute finding. Other: None. IMPRESSION: 1. Stable head CT, no acute intracranial process. Electronically Signed   By: Sharlet Salina M.D.   On: 11/12/2022 00:46   DG Wrist Complete Right  Result Date:  11/11/2022 CLINICAL DATA:  Fall EXAM: RIGHT WRIST - COMPLETE 3+ VIEW COMPARISON:  None Available. FINDINGS: There is an acute comminuted distal radius fracture with intra-articular extension. There is 1/2 shaft with lateral and anterior displacement of the distal fracture fragment. There is impaction with apex posterior angulation. There is also an acute fracture of the distal ulnar diaphysis with apex medial angulation. There is no definite dislocation. There is soft tissue swelling surrounding the wrist. IMPRESSION: 1. Acute comminuted displaced and angulated distal radius fracture with intra-articular extension. 2. Acute fracture of the distal ulnar diaphysis with apex medial angulation. Electronically Signed   By: Darliss Cheney M.D.   On: 11/11/2022 20:59    Microbiology: Results for orders placed or performed during the hospital encounter of 09/08/22  Resp panel by RT-PCR (RSV, Flu A&B, Covid) Anterior Nasal Swab     Status: None   Collection Time: 09/08/22  2:25 PM   Specimen: Anterior Nasal Swab  Result Value Ref Range Status   SARS Coronavirus 2 by RT PCR NEGATIVE NEGATIVE Final    Comment: (NOTE) SARS-CoV-2 target nucleic acids are NOT DETECTED.  The SARS-CoV-2 RNA is generally detectable in upper respiratory specimens during the acute phase of infection. The lowest concentration of SARS-CoV-2 viral copies this assay can detect is 138 copies/mL. A negative result does not preclude SARS-Cov-2 infection and should not be used as the sole basis for treatment or other patient management decisions. A negative result may occur with  improper specimen collection/handling, submission of specimen other than nasopharyngeal swab, presence of viral mutation(s) within the areas targeted by this assay, and inadequate number of viral copies(<138 copies/mL). A negative result must be combined with clinical observations, patient history, and epidemiological information. The expected result is  Negative.  Fact Sheet for Patients:  BloggerCourse.com  Fact Sheet for Healthcare Providers:  SeriousBroker.it  This test is no t yet approved or cleared by the [place] FDA and  has been authorized for detection and/or diagnosis of SARS-CoV-2 by FDA under an Emergency Use Authorization (EUA). This EUA will remain  in effect (meaning this test can be used) for the duration of the COVID-19 declaration under Section 564(b)(1) of the Act, 21 U.S.C.section 360bbb-3(b)(1), unless the authorization is terminated  or revoked sooner.  Influenza A by PCR NEGATIVE NEGATIVE Final   Influenza B by PCR NEGATIVE NEGATIVE Final    Comment: (NOTE) The Xpert Xpress SARS-CoV-2/FLU/RSV plus assay is intended as an aid in the diagnosis of influenza from Nasopharyngeal swab specimens and should not be used as a sole basis for treatment. Nasal washings and aspirates are unacceptable for Xpert Xpress SARS-CoV-2/FLU/RSV testing.  Fact Sheet for Patients: BloggerCourse.com  Fact Sheet for Healthcare Providers: SeriousBroker.it  This test is not yet approved or cleared by the [place] FDA and has been authorized for detection and/or diagnosis of SARS-CoV-2 by FDA under an Emergency Use Authorization (EUA). This EUA will remain in effect (meaning this test can be used) for the duration of the COVID-19 declaration under Section 564(b)(1) of the Act, 21 U.S.C. section 360bbb-3(b)(1), unless the authorization is terminated or revoked.     Resp Syncytial Virus by PCR NEGATIVE NEGATIVE Final    Comment: (NOTE) Fact Sheet for Patients: BloggerCourse.com  Fact Sheet for Healthcare Providers: SeriousBroker.it  This test is not yet approved or cleared by the [place] FDA and has been authorized for detection and/or diagnosis of  SARS-CoV-2 by FDA under an Emergency Use Authorization (EUA). This EUA will remain in effect (meaning this test can be used) for the duration of the COVID-19 declaration under Section 564(b)(1) of the Act, 21 U.S.C. section 360bbb-3(b)(1), unless the authorization is terminated or revoked.  Performed at [facility], [address] [place], [place] [address]   Blood Culture (routine x 2)     Status: None   Collection Time: 09/08/22  2:25 PM   Specimen: BLOOD  Result Value Ref Range Status   Specimen Description BLOOD BLOOD LEFT ARM  Final   Special Requests   Final    BOTTLES DRAWN AEROBIC AND ANAEROBIC Blood Culture adequate volume   Culture   Final    NO GROWTH 5 DAYS Performed at [facility], [address] [place], [place] [address]    Report Status 09/13/2022 FINAL  Final  Blood Culture (routine x 2)     Status: None   Collection Time: 09/08/22  2:27 PM   Specimen: BLOOD  Result Value Ref Range Status   Specimen Description BLOOD BLOOD RIGHT HAND  Final   Special Requests   Final    BOTTLES DRAWN AEROBIC AND ANAEROBIC Blood Culture adequate volume   Culture   Final    NO GROWTH 5 DAYS Performed at [facility], [address] [place], [place] [address]    Report Status 09/13/2022 FINAL  Final    Labs: CBC: Recent Labs  Lab 11/11/22 2123 11/13/22 0441 11/14/22 1059  WBC 8.1 6.4 5.1  NEUTROABS  --  3.8 3.8  HGB 11.7* 9.4* 9.5*  HCT 34.1* 28.1* 28.7*  MCV 99.1 102.2* 102.5*  PLT 198 160 156   Basic Metabolic Panel: Recent Labs  Lab 11/11/22 2123 11/13/22 0441  NA 137 136  K 3.2* 4.0  CL 104 105  CO2 27 22  GLUCOSE 109* 107*  BUN 14 15  CREATININE 0.79 0.90  CALCIUM 8.6* 8.3*   Liver Function Tests: No results for input(s): "AST", "ALT", "ALKPHOS", "BILITOT", "PROT", "ALBUMIN" in the last 168 hours. CBG: Recent Labs  Lab 11/13/22 0752  GLUCAP 118*    Discharge time spent: greater than 30  minutes.  Signed: Lucile Shutters, MD [facility] 11/15/2022

## 2022-11-15 NOTE — Progress Notes (Addendum)
Late entry Notified by RN that patient fell into the bed while dressing up to be discharged and hit her head on the bed rail. Patient was seen and examined at the bedside.  Denied feeling dizzy or lightheaded.  Appears to be at her baseline and was eating breakfast.  Examined for evidence of a hematoma and there was none. Orthostatic vital signs checked with no evidence of orthostatic blood pressure changes. Patient noted to be normotensive and is on losartan 50 mg daily which has since been discontinued. Patient to be discharged to ALF

## 2022-11-15 NOTE — Plan of Care (Signed)
  Problem: Clinical Measurements: Goal: Diagnostic test results will improve Outcome: Progressing   Problem: Nutrition: Goal: Adequate nutrition will be maintained Outcome: Progressing   Problem: Elimination: Goal: Will not experience complications related to bowel motility Outcome: Progressing Goal: Will not experience complications related to urinary retention Outcome: Progressing   Problem: Pain Managment: Goal: General experience of comfort will improve Outcome: Progressing   Problem: Skin Integrity: Goal: Risk for impaired skin integrity will decrease Outcome: Progressing

## 2022-11-15 NOTE — Care Management Important Message (Signed)
Important Message  Patient Details  Name: Marcia Brown MRN: [id number] Date of Birth: 1956/02/27   Medicare Important Message Given:  Other (see comment)  Did not receive a call back.    Doy A Jaala Bohle 11/15/2022, 1:54 PM

## 2022-11-15 NOTE — Progress Notes (Signed)
Report given to Lane from [employer]

## 2022-12-03 ENCOUNTER — Encounter: Payer: Self-pay | Admitting: Specialist

## 2022-12-03 NOTE — Anesthesia Postprocedure Evaluation (Signed)
Anesthesia Post Note  Patient: Marcia Brown  Procedure(s) Performed: OPEN REDUCTION INTERNAL FIXATION (ORIF) DISTAL RADIUS FRACTURE (Right: Wrist)  Patient location during evaluation: PACU Anesthesia Type: General Level of consciousness: awake and alert Pain management: pain level controlled Vital Signs Assessment: post-procedure vital signs reviewed and stable Respiratory status: spontaneous breathing, nonlabored ventilation, respiratory function stable and patient connected to nasal cannula oxygen Cardiovascular status: blood pressure returned to baseline and stable Postop Assessment: no apparent nausea or vomiting Anesthetic complications: no   No notable events documented.   Last Vitals:  Vitals:   11/15/22 0736 11/15/22 0949  BP: (!) 108/54 116/60  Pulse: 65 61  Resp: 16   Temp: 36.8 C   SpO2: 95%     Last Pain:  Vitals:   11/15/22 0945  TempSrc:   PainSc: 0-No pain                 Lenard Simmer

## 2022-12-07 ENCOUNTER — Emergency Department: Payer: Medicare Other

## 2022-12-07 ENCOUNTER — Other Ambulatory Visit: Payer: Self-pay

## 2022-12-07 DIAGNOSIS — S0001XA Abrasion of scalp, initial encounter: Secondary | ICD-10-CM | POA: Insufficient documentation

## 2022-12-07 DIAGNOSIS — W050XXA Fall from non-moving wheelchair, initial encounter: Secondary | ICD-10-CM | POA: Diagnosis not present

## 2022-12-07 DIAGNOSIS — R410 Disorientation, unspecified: Secondary | ICD-10-CM | POA: Diagnosis present

## 2022-12-07 NOTE — ED Notes (Signed)
RN attempted blood draw, unsuccessful.

## 2022-12-07 NOTE — ED Notes (Signed)
First Nurse Note -  Patient to waiting room via wheelchair by EMS from [facility] [address]  Patient was in their smoking area and patient fell.  Patient with hematoma and laceration on left side of face. Unknown if lost consciousness, patient not on blood thinners.  Reports vital signs within normal limits.  Per EMS report nsg staff report patient is at her base line.

## 2022-12-07 NOTE — ED Triage Notes (Signed)
Pt to ed from [facility] [place] via [facility] for an unwitnessed fall with unknown LOC. Pt is alert to baseline per EMS. Pt has HX of dementia. Pt has hematoma to her left temperal region. Bleeding controlled at this time. Pt in no acute distress. Pt states she fell out of her wheel chair. Pt is in a hard cast on her right wrist already.

## 2022-12-08 ENCOUNTER — Emergency Department
Admission: EM | Admit: 2022-12-08 | Discharge: 2022-12-08 | Disposition: A | Discharge status: Home / Self Care | Payer: Medicare Other | Attending: Emergency Medicine | Admitting: Emergency Medicine

## 2022-12-08 ENCOUNTER — Other Ambulatory Visit: Payer: Self-pay

## 2022-12-08 DIAGNOSIS — W19XXXA Unspecified fall, initial encounter: Secondary | ICD-10-CM

## 2022-12-08 DIAGNOSIS — S0001XA Abrasion of scalp, initial encounter: Secondary | ICD-10-CM | POA: Diagnosis not present

## 2022-12-08 NOTE — ED Notes (Signed)
Patient denies any complaints at this time. VSS for transport

## 2022-12-08 NOTE — ED Provider Notes (Signed)
[facility] Provider Note    Event Date/Time   First MD Initiated Contact with Patient 12/08/22 0020     (approximate)   History   Fall   HPI  Marcia Brown is a 67 y.o. female who presents to the ED for evaluation of Fall   I review 6/6 medical DC summary.  Admitted for a right wrist fracture requiring ORIF after another fall.  Frequent falls.  Lives at a local SNF.  Patient presents to the ED for evaluation of a fall out of her wheelchair at her SNF.  Here in the ED, she is pleasantly disoriented and has no complaints.  She is uncertain what happened or why she fell.   Physical Exam   Triage Vital Signs: ED Triage Vitals  Enc Vitals Group     BP 12/07/22 2215 (!) 148/69     Pulse Rate 12/07/22 2215 65     Resp 12/07/22 2215 16     Temp 12/07/22 2215 98 F (36.7 C)     Temp Source 12/07/22 2215 Oral     SpO2 12/07/22 2215 98 %     Weight 12/08/22 0032 150 lb (68 kg)     Height 12/07/22 2215 5\' 2"  (1.575 m)     Head Circumference --      Peak Flow --      Pain Score --      Pain Loc --      Pain Edu? --      Excl. in GC? --     Most recent vital signs: Vitals:   12/08/22 0121 12/08/22 0130  BP: 123/64 129/66  Pulse: (!) 57 (!) 58  Resp: 16 11  Temp:    SpO2: 97% 98%    General: Awake, no distress.  CV:  Good peripheral perfusion.  Resp:  Normal effort.  Abd:  No distention.  MSK:  No deformity noted.  Palpation of all 4 extremities otherwise without evidence of deformity, tenderness or trauma. Neuro:  No focal deficits appreciated. Other:  Splint over the right wrist with neurovascularly intact fingers. Superficial abrasion over the left temple.  No discrete laceration no signs of EOM entrapment and no active bleeding, hemostatic.   ED Results / Procedures / Treatments   Labs (all labs ordered are listed, but only abnormal results are displayed) Labs Reviewed - No data to display  EKG Sinus rhythm with a rate of 60 bpm.   Normal axis.  Incomplete right bundle.  No STEMI.  No high-grade AV blocks.  RADIOLOGY CT head interpreted by me without evidence of acute intracranial pathology CT cervical spine interpreted by me without evidence of fracture or dislocation  Official radiology report(s): CT Head Wo Contrast  Result Date: 12/07/2022 CLINICAL DATA:  Head and neck trauma.  Unwitnessed fall. EXAM: CT HEAD WITHOUT CONTRAST CT CERVICAL SPINE WITHOUT CONTRAST TECHNIQUE: Multidetector CT imaging of the head and cervical spine was performed following the standard protocol without intravenous contrast. Multiplanar CT image reconstructions of the cervical spine were also generated. RADIATION DOSE REDUCTION: This exam was performed according to the departmental dose-optimization program which includes automated exposure control, adjustment of the mA and/or kV according to patient size and/or use of iterative reconstruction technique. COMPARISON:  None Available. FINDINGS: CT HEAD FINDINGS Brain: No evidence of acute infarction, hemorrhage, hydrocephalus, extra-axial collection or mass lesion/mass effect. Encephalomalacia of the right temporal lobe, unchanged. Vascular: No hyperdense vessel or unexpected calcification. Skull: Left frontal soft tissue hematoma  without evidence of calvarial fracture. Postsurgical changes of the right temporal bone. Sinuses/Orbits: No acute finding. Other: None. CT CERVICAL SPINE FINDINGS Alignment: Straightening of the cervical spine. Skull base and vertebrae: No acute fracture. No primary bone lesion or focal pathologic process. Soft tissues and spinal canal: No prevertebral fluid or swelling. No visible canal hematoma. Disc levels: C2-C3: No significant disc bulge, spinal canal or neural foraminal stenosis. C3-C4: Disc height loss and uncovertebral joint arthropathy with mild right neural foraminal stenosis. Mild bilateral facet joint arthropathy. C4-C5: Disc height loss and osteophytes with  uncovertebral joint arthropathy. Mild left and moderate right neural foraminal stenosis. Mild bilateral facet joint arthropathy. C5-C6: Disc height loss and uncovertebral joint arthropathy with mild left and moderate right neural foraminal stenosis. C6-C7: Disc height loss and uncovertebral joint arthropathy no significant neural foraminal stenosis. C7-T1: Disc height loss and uncovertebral joint arthropathy with mild left neural foraminal stenosis. Upper chest: Negative. Other: None IMPRESSION: CT HEAD: 1. No acute intracranial abnormality. 2. Left frontal soft tissue hematoma without evidence of calvarial fracture. 3. Encephalomalacia of the right temporal lobe, unchanged. CT CERVICAL SPINE: 1. No acute fracture or traumatic subluxation. 2. Multilevel degenerative disc disease and uncovertebral joint arthropathy with moderate right neural foraminal stenosis at C4-C5 and C5-C6. Electronically Signed   By: Larose Hires D.O.   On: 12/07/2022 23:06   CT Cervical Spine Wo Contrast  Result Date: 12/07/2022 CLINICAL DATA:  Head and neck trauma.  Unwitnessed fall. EXAM: CT HEAD WITHOUT CONTRAST CT CERVICAL SPINE WITHOUT CONTRAST TECHNIQUE: Multidetector CT imaging of the head and cervical spine was performed following the standard protocol without intravenous contrast. Multiplanar CT image reconstructions of the cervical spine were also generated. RADIATION DOSE REDUCTION: This exam was performed according to the departmental dose-optimization program which includes automated exposure control, adjustment of the mA and/or kV according to patient size and/or use of iterative reconstruction technique. COMPARISON:  None Available. FINDINGS: CT HEAD FINDINGS Brain: No evidence of acute infarction, hemorrhage, hydrocephalus, extra-axial collection or mass lesion/mass effect. Encephalomalacia of the right temporal lobe, unchanged. Vascular: No hyperdense vessel or unexpected calcification. Skull: Left frontal soft tissue  hematoma without evidence of calvarial fracture. Postsurgical changes of the right temporal bone. Sinuses/Orbits: No acute finding. Other: None. CT CERVICAL SPINE FINDINGS Alignment: Straightening of the cervical spine. Skull base and vertebrae: No acute fracture. No primary bone lesion or focal pathologic process. Soft tissues and spinal canal: No prevertebral fluid or swelling. No visible canal hematoma. Disc levels: C2-C3: No significant disc bulge, spinal canal or neural foraminal stenosis. C3-C4: Disc height loss and uncovertebral joint arthropathy with mild right neural foraminal stenosis. Mild bilateral facet joint arthropathy. C4-C5: Disc height loss and osteophytes with uncovertebral joint arthropathy. Mild left and moderate right neural foraminal stenosis. Mild bilateral facet joint arthropathy. C5-C6: Disc height loss and uncovertebral joint arthropathy with mild left and moderate right neural foraminal stenosis. C6-C7: Disc height loss and uncovertebral joint arthropathy no significant neural foraminal stenosis. C7-T1: Disc height loss and uncovertebral joint arthropathy with mild left neural foraminal stenosis. Upper chest: Negative. Other: None IMPRESSION: CT HEAD: 1. No acute intracranial abnormality. 2. Left frontal soft tissue hematoma without evidence of calvarial fracture. 3. Encephalomalacia of the right temporal lobe, unchanged. CT CERVICAL SPINE: 1. No acute fracture or traumatic subluxation. 2. Multilevel degenerative disc disease and uncovertebral joint arthropathy with moderate right neural foraminal stenosis at C4-C5 and C5-C6. Electronically Signed   By: Larose Hires D.O.   On: 12/07/2022  23:06    PROCEDURES and INTERVENTIONS:  .1-3 Lead EKG Interpretation  Performed by: Delton Prairie, MD Authorized by: Delton Prairie, MD     Interpretation: normal     ECG rate:  61   ECG rate assessment: normal     Rhythm: sinus rhythm     Ectopy: none     Conduction: normal     Medications -  No data to display   IMPRESSION / MDM / ASSESSMENT AND PLAN / ED COURSE  I reviewed the triage vital signs and the nursing notes.  Differential diagnosis includes, but is not limited to, skull fracture, ICH, cardiac dysrhythmia  {Patient presents with symptoms of an acute illness or injury that is potentially life-threatening.  Patient presents after unwitnessed fall without evidence of significant acute pathology and suitable for outpatient management.  Has a small abrasion to her left temple but no discrete laceration, active bleeding or signs of complicating features such as EOM entrapment.  Imaging is reassuring, as above.  EKG without interval changes to suggest cardiogenic syncope and no dysrhythmias on the monitor.  Suitable for return to facility      FINAL CLINICAL IMPRESSION(S) / ED DIAGNOSES   Final diagnoses:  Fall, initial encounter  Scalp abrasion, initial encounter     Rx / DC Orders   ED Discharge Orders     None        Note:  This document was prepared using Dragon voice recognition software and may include unintentional dictation errors.   Delton Prairie, MD 12/08/22 [phone]

## 2022-12-08 NOTE — ED Notes (Signed)
Patient has small abrasion to the left temple area of the head. Patient states that she is on a blood thinner,but does not know which one she is take. Patient states that she did not lose consciousness, but unable to determine what she hit her head on. Patient reports dizziness and lightheadedness at this time.

## 2022-12-08 NOTE — ED Notes (Addendum)
Patient to be transported by [facility] at this time to the [facility] [address] [facility] [place] notified at this time.

## 2022-12-11 ENCOUNTER — Emergency Department
Admission: EM | Admit: 2022-12-11 | Discharge: 2022-12-11 | Disposition: A | Discharge status: Home / Self Care | Payer: Medicare Other | Attending: Emergency Medicine | Admitting: Emergency Medicine

## 2022-12-11 ENCOUNTER — Other Ambulatory Visit: Payer: Self-pay

## 2022-12-11 ENCOUNTER — Emergency Department: Payer: Medicare Other

## 2022-12-11 DIAGNOSIS — W1830XA Fall on same level, unspecified, initial encounter: Secondary | ICD-10-CM | POA: Insufficient documentation

## 2022-12-11 DIAGNOSIS — J449 Chronic obstructive pulmonary disease, unspecified: Secondary | ICD-10-CM | POA: Insufficient documentation

## 2022-12-11 DIAGNOSIS — R519 Headache, unspecified: Secondary | ICD-10-CM | POA: Diagnosis present

## 2022-12-11 DIAGNOSIS — Z8673 Personal history of transient ischemic attack (TIA), and cerebral infarction without residual deficits: Secondary | ICD-10-CM | POA: Diagnosis not present

## 2022-12-11 DIAGNOSIS — W19XXXA Unspecified fall, initial encounter: Secondary | ICD-10-CM

## 2022-12-11 LAB — COMPREHENSIVE METABOLIC PANEL
ALT: 16 U/L (ref 0–44)
AST: 22 U/L (ref 15–41)
Albumin: 3.5 g/dL (ref 3.5–5.0)
Alkaline Phosphatase: 79 U/L (ref 38–126)
Anion gap: 7 (ref 5–15)
BUN: 14 mg/dL (ref 8–23)
CO2: 23 mmol/L (ref 22–32)
Calcium: 8.6 mg/dL — ABNORMAL LOW (ref 8.9–10.3)
Chloride: 106 mmol/L (ref 98–111)
Creatinine, Ser: 0.69 mg/dL (ref 0.44–1.00)
GFR, Estimated: >60 mL/min (ref >60)
Glucose, Bld: 87 mg/dL (ref 70–99)
Potassium: 3.3 mmol/L — ABNORMAL LOW (ref 3.5–5.1)
Sodium: 136 mmol/L (ref 135–145)
Total Bilirubin: 0.1 mg/dL — ABNORMAL LOW (ref 0.3–1.2)
Total Protein: 6 g/dL — ABNORMAL LOW (ref 6.5–8.1)

## 2022-12-11 LAB — URINALYSIS, COMPLETE (UACMP) WITH MICROSCOPIC
Bacteria, UA: NONE SEEN
Bilirubin Urine: NEGATIVE
Glucose, UA: NEGATIVE mg/dL
Ketones, ur: NEGATIVE mg/dL
Leukocytes,Ua: NEGATIVE
Nitrite: NEGATIVE
Protein, ur: NEGATIVE mg/dL
Specific Gravity, Urine: 1.011 (ref 1.005–1.030)
pH: 6 (ref 5.0–8.0)

## 2022-12-11 LAB — CBC
HCT: 36.9 % (ref 36.0–46.0)
Hemoglobin: 12 g/dL (ref 12.0–15.0)
MCH: 33.7 pg (ref 26.0–34.0)
MCHC: 32.5 g/dL (ref 30.0–36.0)
MCV: 103.7 fL — ABNORMAL HIGH (ref 80.0–100.0)
Platelets: 130 10*3/uL — ABNORMAL LOW (ref 150–400)
RBC: 3.56 MIL/uL — ABNORMAL LOW (ref 3.87–5.11)
RDW: 13.6 % (ref 11.5–15.5)
WBC: 3.7 10*3/uL — ABNORMAL LOW (ref 4.0–10.5)
nRBC: 0 % (ref 0.0–0.2)

## 2022-12-11 NOTE — ED Provider Notes (Signed)
[facility] Provider Note    Event Date/Time   First MD Initiated Contact with Patient 12/11/22 0215     (approximate)  History   Chief Complaint: Fall (Pt had an unwitnessed fall tonight. Pt reports pain to the left side of her head. Pt is coming from [facility] [address] Per EMS pt has old injuries )  HPI  RETAL HARLAN is a 67 y.o. female with a past medical history anxiety, COPD, CVA, presents to the emergency department from her nursing facility for a fall.  Patient found on the floor unwitnessed fall earlier tonight.  Patient complaining of pain to left side of head as well as her neck so they transported her to the emergency department for evaluation.  Here the patient appears well she does have some older appearing bruising to her left forehead/eyebrow.  Her right forearm is in a cast from her prior injury.  Patient is awake alert she has no distress.  States her head is no longer bothering her but she is still having some neck pain.   Physical Exam   Triage Vital Signs: ED Triage Vitals [12/11/22 0231]  Enc Vitals Group     BP 137/71     Pulse Rate (!) 54     Resp 18     Temp 98 F (36.7 C)     Temp Source Oral     SpO2 97 %     Weight      Height      Head Circumference      Peak Flow      Pain Score      Pain Loc      Pain Edu?      Excl. in GC?     Most recent vital signs: Vitals:   12/11/22 0231  BP: 137/71  Pulse: (!) 54  Resp: 18  Temp: 98 F (36.7 C)  SpO2: 97%    General: Awake, no distress.  CV:  Good peripheral perfusion.  Regular rate and rhythm  Resp:  Normal effort.  Equal breath sounds bilaterally.  Abd:  No distention.  Soft, nontender.  No rebound or guarding.  ED Results / Procedures / Treatments   RADIOLOGY  I have reviewed and interpreted CT head images.  No bleed seen on my evaluation. Radiology is read the CT scan of the head and C-spine is negative for acute abnormality.   MEDICATIONS ORDERED IN  ED: Medications - No data to display   IMPRESSION / MDM / ASSESSMENT AND PLAN / ED COURSE  I reviewed the triage vital signs and the nursing notes.  Patient's presentation is most consistent with acute presentation with potential threat to life or bodily function.  Patient presents to the emergency department for an unwitnessed fall at her nursing facility.  Patient has signs of old bruising to the left forehead/eyebrow she has a cast on her right forearm.  Great range of motion in all extremities including lower extremities with no pain elicited.  Nontender chest and abdomen.  No obvious C-spine tenderness.  No obvious signs of new traumatic injury to the head.  However given the patient's unwitnessed fall we will obtain CT imaging of the head and C-spine we will also check basic labs and continue to closely monitor.  Patient agreeable to plan of care.  Patient's workup is reassuring, chemistry shows no significant findings.  Urinalysis is normal CBC shows no significant finding.  CT scan of the head and  neck are negative for acute abnormality.  Given the patient's reassuring workup we will discharge back to her nursing facility.  Patient agreeable to plan.  FINAL CLINICAL IMPRESSION(S) / ED DIAGNOSES   Fall    Note:  This document was prepared using Dragon voice recognition software and may include unintentional dictation errors.   Minna Antis, MD 12/11/22 [phone]

## 2022-12-11 NOTE — Discharge Instructions (Addendum)
You have been seen in the emergency department after a fall.  Your workup including blood work, urine and CT images of your head and neck are normal.  Please follow-up with your doctor.  Return to the emergency department for any symptom concerning to yourself.

## 2022-12-11 NOTE — ED Triage Notes (Signed)
Pt had an unwitnessed fall tonight. Pt reports pain to the left side of her head. Pt is coming from [facility] [address] Per EMS pt has old injuries

## 2023-01-03 ENCOUNTER — Emergency Department: Payer: Medicare Other

## 2023-01-03 ENCOUNTER — Encounter: Payer: Self-pay | Admitting: Emergency Medicine

## 2023-01-03 ENCOUNTER — Other Ambulatory Visit: Payer: Self-pay

## 2023-01-03 ENCOUNTER — Emergency Department
Admission: EM | Admit: 2023-01-03 | Discharge: 2023-01-03 | Disposition: A | Discharge status: Home / Self Care | Payer: Medicare Other | Attending: Student in an Organized Health Care Education/Training Program | Admitting: Student in an Organized Health Care Education/Training Program

## 2023-01-03 DIAGNOSIS — X58XXXA Exposure to other specified factors, initial encounter: Secondary | ICD-10-CM | POA: Insufficient documentation

## 2023-01-03 DIAGNOSIS — S42201D Unspecified fracture of upper end of right humerus, subsequent encounter for fracture with routine healing: Secondary | ICD-10-CM | POA: Insufficient documentation

## 2023-01-03 DIAGNOSIS — M25511 Pain in right shoulder: Secondary | ICD-10-CM | POA: Diagnosis present

## 2023-01-03 MED ORDER — TRAMADOL HCL 50 MG PO TABS: 50.0000 mg | ORAL_TABLET | Freq: Four times a day (QID) | ORAL | 0 refills | Status: DC | PRN

## 2023-01-03 MED ORDER — TRAMADOL HCL 50 MG PO TABS
50.0000 mg | ORAL_TABLET | Freq: Once | ORAL | Status: AC
  Administered 2023-01-03: 50 mg via ORAL
  Filled 2023-01-03: qty 1

## 2023-01-03 NOTE — ED Triage Notes (Signed)
Patient to ED via [facility] from the [facility] for a right shoulder injury. Possible dislocation per EMS- facility unsure how patient injured self. Shoulder fine per reports at facility as of 5pm yesterday. Hx of dementia  75 mcg of fentanyl given by EMS- all VS WNL with EMS

## 2023-01-03 NOTE — ED Notes (Signed)
[facility]  CALLED  FOR  TRANSPORT  TO  [facility]  [place] 

## 2023-01-03 NOTE — ED Provider Notes (Signed)
[facility] Provider Note    Event Date/Time   First MD Initiated Contact with Patient 01/03/23 1208     (approximate)   History   Shoulder Injury   HPI  Marcia Brown is a 67 y.o. female with fairly recent fall with evidence of proximal humerus fracture presents to the ER for evaluation of pain in the right proximal humerus with when she is taking shower today.  Denies any interval falls.  Denies any new pain.  No other complaints.  Not wearing a sling that was recommended by Ortho.     Physical Exam   Triage Vital Signs: ED Triage Vitals [01/03/23 1217]  Encounter Vitals Group     BP 135/83     Systolic BP Percentile      Diastolic BP Percentile      Pulse Rate (!) 55     Resp 18     Temp 98.9 F (37.2 C)     Temp Source Oral     SpO2 98 %     Weight      Height      Head Circumference      Peak Flow      Pain Score      Pain Loc      Pain Education      Exclude from Growth Chart     Most recent vital signs: Vitals:   01/03/23 1217 01/03/23 1220  BP: 135/83   Pulse: (!) 55 (!) 57  Resp: 18   Temp: 98.9 F (37.2 C)   SpO2: 98% 99%     Constitutional: Alert  Eyes: Conjunctivae are normal.  Head: Atraumatic. Nose: No congestion/rhinnorhea. Mouth/Throat: Mucous membranes are moist.   Neck: Painless ROM.  Cardiovascular:   Good peripheral circulation. Respiratory: Normal respiratory effort.  No retractions.  Gastrointestinal: Soft and nontender.  Musculoskeletal: Evolving contusions of the right shoulder and hand.  Neurovascular intact. Neurologic:  MAE spontaneously. No gross focal neurologic deficits are appreciated.  Skin:  Skin is warm, dry and intact. No rash noted. Psychiatric: Mood and affect are normal. Speech and behavior are normal.    ED Results / Procedures / Treatments   Labs (all labs ordered are listed, but only abnormal results are displayed) Labs Reviewed - No data to  display   EKG     RADIOLOGY Please see ED Course for my review and interpretation.  I personally reviewed all radiographic images ordered to evaluate for the above acute complaints and reviewed radiology reports and findings.  These findings were personally discussed with the patient.  Please see medical record for radiology report.    PROCEDURES:  Critical Care performed: No  Procedures   MEDICATIONS ORDERED IN ED: Medications  traMADol (ULTRAM) tablet 50 mg (has no administration in time range)     IMPRESSION / MDM / ASSESSMENT AND PLAN / ED COURSE  I reviewed the triage vital signs and the nursing notes.                              Differential diagnosis includes, but is not limited to, fracture, contusion, dislocation  Presented to the ER for evaluation of injury to the right shoulder as described above.  X-ray shows persistent fracture.  Questionable compliance with sling and immobilization.  Will give p.o. pain medication.  Do not feel that further diagnostic testing clinically indicated.  Does appear appropriate for follow-up with orthopedics.  FINAL CLINICAL IMPRESSION(S) / ED DIAGNOSES   Final diagnoses:  Closed fracture of proximal end of right humerus with routine healing, unspecified fracture morphology, subsequent encounter     Rx / DC Orders   ED Discharge Orders     None        Note:  This document was prepared using Dragon voice recognition software and may include unintentional dictation errors.    Willy Eddy, MD 01/03/23 [phone]

## 2023-06-06 ENCOUNTER — Emergency Department: Payer: Medicare Other

## 2023-06-06 ENCOUNTER — Inpatient Hospital Stay
Admission: EM | Admit: 2023-06-06 | Discharge: 2023-06-10 | DRG: 101 | Disposition: A | Discharge status: Home Health | Payer: Medicare Other | Attending: Internal Medicine | Admitting: Internal Medicine

## 2023-06-06 ENCOUNTER — Other Ambulatory Visit: Payer: Self-pay

## 2023-06-06 ENCOUNTER — Encounter: Payer: Self-pay | Admitting: Emergency Medicine

## 2023-06-06 DIAGNOSIS — Z8673 Personal history of transient ischemic attack (TIA), and cerebral infarction without residual deficits: Secondary | ICD-10-CM

## 2023-06-06 DIAGNOSIS — F05 Delirium due to known physiological condition: Secondary | ICD-10-CM | POA: Diagnosis not present

## 2023-06-06 DIAGNOSIS — G40109 Localization-related (focal) (partial) symptomatic epilepsy and epileptic syndromes with simple partial seizures, not intractable, without status epilepticus: Secondary | ICD-10-CM | POA: Diagnosis not present

## 2023-06-06 DIAGNOSIS — Z7951 Long term (current) use of inhaled steroids: Secondary | ICD-10-CM

## 2023-06-06 DIAGNOSIS — R569 Unspecified convulsions: Secondary | ICD-10-CM | POA: Diagnosis not present

## 2023-06-06 DIAGNOSIS — Z886 Allergy status to analgesic agent status: Secondary | ICD-10-CM

## 2023-06-06 DIAGNOSIS — Z881 Allergy status to other antibiotic agents status: Secondary | ICD-10-CM

## 2023-06-06 DIAGNOSIS — R29810 Facial weakness: Secondary | ICD-10-CM | POA: Diagnosis present

## 2023-06-06 DIAGNOSIS — R4701 Aphasia: Secondary | ICD-10-CM | POA: Diagnosis present

## 2023-06-06 DIAGNOSIS — G629 Polyneuropathy, unspecified: Secondary | ICD-10-CM | POA: Diagnosis present

## 2023-06-06 DIAGNOSIS — Z791 Long term (current) use of non-steroidal anti-inflammatories (NSAID): Secondary | ICD-10-CM

## 2023-06-06 DIAGNOSIS — F0394 Unspecified dementia, unspecified severity, with anxiety: Secondary | ICD-10-CM | POA: Diagnosis present

## 2023-06-06 DIAGNOSIS — Z79899 Other long term (current) drug therapy: Secondary | ICD-10-CM

## 2023-06-06 DIAGNOSIS — E7800 Pure hypercholesterolemia, unspecified: Secondary | ICD-10-CM | POA: Diagnosis present

## 2023-06-06 DIAGNOSIS — Z9181 History of falling: Secondary | ICD-10-CM

## 2023-06-06 DIAGNOSIS — G40919 Epilepsy, unspecified, intractable, without status epilepticus: Secondary | ICD-10-CM | POA: Diagnosis present

## 2023-06-06 DIAGNOSIS — F32A Depression, unspecified: Secondary | ICD-10-CM | POA: Diagnosis present

## 2023-06-06 DIAGNOSIS — F419 Anxiety disorder, unspecified: Secondary | ICD-10-CM | POA: Diagnosis present

## 2023-06-06 DIAGNOSIS — G4730 Sleep apnea, unspecified: Secondary | ICD-10-CM | POA: Diagnosis present

## 2023-06-06 DIAGNOSIS — R4789 Other speech disturbances: Secondary | ICD-10-CM

## 2023-06-06 DIAGNOSIS — Z993 Dependence on wheelchair: Secondary | ICD-10-CM

## 2023-06-06 DIAGNOSIS — R296 Repeated falls: Secondary | ICD-10-CM | POA: Diagnosis present

## 2023-06-06 DIAGNOSIS — F17210 Nicotine dependence, cigarettes, uncomplicated: Secondary | ICD-10-CM | POA: Diagnosis present

## 2023-06-06 DIAGNOSIS — J4489 Other specified chronic obstructive pulmonary disease: Secondary | ICD-10-CM | POA: Diagnosis present

## 2023-06-06 LAB — BASIC METABOLIC PANEL
Anion gap: 13 (ref 5–15)
BUN: 13 mg/dL (ref 8–23)
CO2: 21 mmol/L — ABNORMAL LOW (ref 22–32)
Calcium: 9.2 mg/dL (ref 8.9–10.3)
Chloride: 103 mmol/L (ref 98–111)
Creatinine, Ser: 0.77 mg/dL (ref 0.44–1.00)
GFR, Estimated: >60 mL/min (ref >60)
Glucose, Bld: 89 mg/dL (ref 70–99)
Potassium: 3.9 mmol/L (ref 3.5–5.1)
Sodium: 137 mmol/L (ref 135–145)

## 2023-06-06 LAB — CARBAMAZEPINE LEVEL, TOTAL: Carbamazepine Lvl: <2 ug/mL — ABNORMAL LOW (ref 4.0–12.0)

## 2023-06-06 LAB — CBG MONITORING, ED: Glucose-Capillary: 93 mg/dL (ref 70–99)

## 2023-06-06 MED ORDER — CARBAMAZEPINE 200 MG PO TABS
200.0000 mg | ORAL_TABLET | Freq: Once | ORAL | Status: AC
  Administered 2023-06-07: 200 mg via ORAL
  Filled 2023-06-06: qty 1

## 2023-06-06 MED ORDER — LEVETIRACETAM 500 MG PO TABS
500.0000 mg | ORAL_TABLET | Freq: Once | ORAL | Status: AC
  Administered 2023-06-07: 500 mg via ORAL
  Filled 2023-06-06: qty 1

## 2023-06-06 MED ORDER — LAMOTRIGINE 100 MG PO TABS
200.0000 mg | ORAL_TABLET | Freq: Once | ORAL | Status: AC
  Administered 2023-06-07: 200 mg via ORAL
  Filled 2023-06-06: qty 2

## 2023-06-06 MED ORDER — LACOSAMIDE 50 MG PO TABS
150.0000 mg | ORAL_TABLET | Freq: Two times a day (BID) | ORAL | Status: DC
  Administered 2023-06-07 – 2023-06-10 (×7): 150 mg via ORAL
  Filled 2023-06-06 (×7): qty 3

## 2023-06-06 MED ORDER — PHENYTOIN SODIUM EXTENDED 100 MG PO CAPS
100.0000 mg | ORAL_CAPSULE | Freq: Once | ORAL | Status: AC
Start: 2023-06-07 — End: 2023-06-07
  Administered 2023-06-07: 100 mg via ORAL
  Filled 2023-06-06: qty 1

## 2023-06-06 NOTE — ED Triage Notes (Signed)
Pt BIB [facility] from [facility] - report not received by this RN. Pt arrives following a seizure, lasting unknown time. Pt c/ head pain no obvious injury. Is on plavix.

## 2023-06-06 NOTE — ED Provider Notes (Signed)
[facility] Provider Note    Event Date/Time   First MD Initiated Contact with Patient 06/06/23 2312     (approximate)   History   Seizures and Fall (Plavix)   HPI  Marcia Brown is a 67 y.o. female who presents to the ED for evaluation of Seizures and Fall (Plavix)   I review a neurology clinic visit from October.  Carbamazepine, Vimpat, Dilantin, Lamictal and Keppra.  History of frequent falls, wheelchair dependent.  Patient presents to the ED from her local SNF for evaluation of possible syncopal episode or seizure.  She does not know what happened.  She remembers going to sleep and then "I do not know what."  History is quite limited.  She is notably aphasic with me and has significant difficulty finding her words.  Reports she is not always like this.  Does not know when this started.   Physical Exam   Triage Vital Signs: ED Triage Vitals  Encounter Vitals Group     BP 06/06/23 1912 (!) 140/77     Systolic BP Percentile --      Diastolic BP Percentile --      Pulse Rate 06/06/23 1912 60     Resp 06/06/23 1912 17     Temp 06/06/23 1912 98.3 F (36.8 C)     Temp Source 06/06/23 1912 Oral     SpO2 06/06/23 1912 98 %     Weight 06/06/23 1918 150 lb (68 kg)     Height 06/06/23 1918 5' (1.524 m)     Head Circumference --      Peak Flow --      Pain Score --      Pain Loc --      Pain Education --      Exclude from Growth Chart --     Most recent vital signs: Vitals:   06/07/23 0230 06/07/23 0235  BP: 122/72   Pulse: 82   Resp: 10   Temp:  98.3 F (36.8 C)  SpO2:      General: Awake, no distress.  Word finding difficulties and aphasia CV:  Good peripheral perfusion.  Resp:  Normal effort.  Abd:  No distention.  Soft MSK:  No deformity noted.  Neuro:  No focal deficits appreciated.  Full strength and sensation to all 4 extremities Other:     ED Results / Procedures / Treatments   Labs (all labs ordered are listed, but only  abnormal results are displayed) Labs Reviewed  BASIC METABOLIC PANEL - Abnormal; Notable for the following components:      Result Value   CO2 21 (*)    All other components within normal limits  CARBAMAZEPINE LEVEL, TOTAL - Abnormal; Notable for the following components:   Carbamazepine Lvl <2.0 (*)    All other components within normal limits  CBC WITH DIFFERENTIAL/PLATELET - Abnormal; Notable for the following components:   RBC 3.81 (*)    MCV 100.8 (*)    MCH 34.1 (*)    All other components within normal limits  URINALYSIS, ROUTINE W REFLEX MICROSCOPIC  CBG MONITORING, ED  CBG MONITORING, ED    EKG Sinus rhythm with a rate of 61 bpm.  Normal axis and intervals.  No evidence of acute ischemia.  RADIOLOGY CT head interpreted by me without evidence of acute intracranial pathology CT cervical spine interpreted by me without evidence of fracture or dislocation  Official radiology report(s): MR BRAIN WO CONTRAST Result Date: 06/07/2023  CLINICAL DATA:  Word-finding difficulties, aphasia, possible seizure EXAM: MRI HEAD WITHOUT CONTRAST TECHNIQUE: Multiplanar, multiecho pulse sequences of the brain and surrounding structures were obtained without intravenous contrast. COMPARISON:  10/03/2022 MRI head, correlation is also made with 06/06/2023 CT head FINDINGS: Evaluation is somewhat limited by motion artifact. Brain: Increased T2 hyperintense signal and asymmetric volume loss in the right hippocampus (series 7, image 17 and series 8, image 14), which may be related to encephalomalacia in the right frontal lobe or could represent mesial temporal sclerosis. No heterotopia or evidence of cortical dysgenesis. No restricted diffusion to suggest acute or subacute infarct. No acute hemorrhage, mass, mass effect, or midline shift. No hydrocephalus or extra-axial collection. Partial empty sella. Craniocervical junction within normal limits. No hemosiderin deposition to suggest remote hemorrhage.  Cerebral volume within normal limits for age. Scattered T2 hyperintense signal in the periventricular white matter, likely the sequela of mild a chronic small vessel ischemic disease. Vascular: Normal arterial flow voids. Skull and upper cervical spine: Normal marrow signal. Sinuses/Orbits: Mucous retention cyst in the left sphenoid sinus. Mild mucosal thickening in the ethmoid air cells. Status post bilateral lens replacements. No acute finding in the orbits. Other: The mastoid air cells are well aerated. IMPRESSION: 1. No acute intracranial process. No evidence of acute or subacute infarct. 2. Increased T2 hyperintense signal and asymmetric volume loss in the right hippocampus, which may be related to encephalomalacia in the right frontal lobe or could represent mesial temporal sclerosis. Electronically Signed   By: Wiliam Ke M.D.   On: 06/07/2023 01:27   CT Head Wo Contrast Result Date: 06/06/2023 CLINICAL DATA:  Head trauma, minor (Age >= 65y); Facial trauma, blunt EXAM: CT HEAD WITHOUT CONTRAST CT CERVICAL SPINE WITHOUT CONTRAST TECHNIQUE: Multidetector CT imaging of the head and cervical spine was performed following the standard protocol without intravenous contrast. Multiplanar CT image reconstructions of the cervical spine were also generated. RADIATION DOSE REDUCTION: This exam was performed according to the departmental dose-optimization program which includes automated exposure control, adjustment of the mA and/or kV according to patient size and/or use of iterative reconstruction technique. COMPARISON:  CT head and C-spine 12/11/2022 FINDINGS: CT HEAD FINDINGS Brain: Chronic appearing bilateral subdural hygroma. Lower right anterior temporal encephalomalacia. No evidence of large-territorial acute infarction. No parenchymal hemorrhage. No mass lesion. No extra-axial collection. No mass effect or midline shift. No hydrocephalus. Basilar cisterns are patent. Vascular: No hyperdense vessel.  Atherosclerotic calcifications are present within the cavernous internal carotid arteries. Skull: No acute fracture or focal lesion. Likely prior right orbital fracture. Sinuses/Orbits: Left sphenoid sinus mucosal thickening. Otherwise paranasal sinuses and mastoid air cells are clear. The orbits are unremarkable. Other: None. CT CERVICAL SPINE FINDINGS Alignment: Grade 1 anterolisthesis of C3 on C4. Skull base and vertebrae: Multilevel moderate degenerative changes of the spine with associated posterior disc osteophyte complex formation. Severe osseous neural foraminal stenosis of the bilateral C5-C6 osseous neural foramina. No severe osseous central canal stenosis. No acute fracture. No aggressive appearing focal osseous lesion or focal pathologic process. Soft tissues and spinal canal: No prevertebral fluid or swelling. No visible canal hematoma. Upper chest: Unremarkable. Other: Atherosclerotic plaque of the aorta and its main branches. Patient is edentulous. IMPRESSION: 1. No acute intracranial abnormality. 2. No acute displaced fracture or traumatic listhesis of the cervical spine. 3. Multilevel moderate degenerative changes of the spine. Severe osseous neural foraminal stenosis of the bilateral C5-C6 osseous neural foramina. 4.  Aortic Atherosclerosis (ICD10-I70.0). Electronically Signed   By: Blanchie Serve  Tessie Fass M.D.   On: 06/06/2023 19:54   CT Cervical Spine Wo Contrast Result Date: 06/06/2023 CLINICAL DATA:  Head trauma, minor (Age >= 65y); Facial trauma, blunt EXAM: CT HEAD WITHOUT CONTRAST CT CERVICAL SPINE WITHOUT CONTRAST TECHNIQUE: Multidetector CT imaging of the head and cervical spine was performed following the standard protocol without intravenous contrast. Multiplanar CT image reconstructions of the cervical spine were also generated. RADIATION DOSE REDUCTION: This exam was performed according to the departmental dose-optimization program which includes automated exposure control, adjustment of  the mA and/or kV according to patient size and/or use of iterative reconstruction technique. COMPARISON:  CT head and C-spine 12/11/2022 FINDINGS: CT HEAD FINDINGS Brain: Chronic appearing bilateral subdural hygroma. Lower right anterior temporal encephalomalacia. No evidence of large-territorial acute infarction. No parenchymal hemorrhage. No mass lesion. No extra-axial collection. No mass effect or midline shift. No hydrocephalus. Basilar cisterns are patent. Vascular: No hyperdense vessel. Atherosclerotic calcifications are present within the cavernous internal carotid arteries. Skull: No acute fracture or focal lesion. Likely prior right orbital fracture. Sinuses/Orbits: Left sphenoid sinus mucosal thickening. Otherwise paranasal sinuses and mastoid air cells are clear. The orbits are unremarkable. Other: None. CT CERVICAL SPINE FINDINGS Alignment: Grade 1 anterolisthesis of C3 on C4. Skull base and vertebrae: Multilevel moderate degenerative changes of the spine with associated posterior disc osteophyte complex formation. Severe osseous neural foraminal stenosis of the bilateral C5-C6 osseous neural foramina. No severe osseous central canal stenosis. No acute fracture. No aggressive appearing focal osseous lesion or focal pathologic process. Soft tissues and spinal canal: No prevertebral fluid or swelling. No visible canal hematoma. Upper chest: Unremarkable. Other: Atherosclerotic plaque of the aorta and its main branches. Patient is edentulous. IMPRESSION: 1. No acute intracranial abnormality. 2. No acute displaced fracture or traumatic listhesis of the cervical spine. 3. Multilevel moderate degenerative changes of the spine. Severe osseous neural foraminal stenosis of the bilateral C5-C6 osseous neural foramina. 4.  Aortic Atherosclerosis (ICD10-I70.0). Electronically Signed   By: Tish Frederickson M.D.   On: 06/06/2023 19:54    PROCEDURES and INTERVENTIONS:  Procedures  Medications  lacosamide  (VIMPAT) tablet 150 mg (150 mg Oral Given 06/07/23 0107)  carbamazepine (TEGRETOL) tablet 200 mg (200 mg Oral Given 06/07/23 0107)  phenytoin (DILANTIN) ER capsule 100 mg (100 mg Oral Given 06/07/23 0107)  levETIRAcetam (KEPPRA) tablet 500 mg (500 mg Oral Given 06/07/23 0107)  lamoTRIgine (LAMICTAL) tablet 200 mg (200 mg Oral Given 06/07/23 0107)     IMPRESSION / MDM / ASSESSMENT AND PLAN / ED COURSE  I reviewed the triage vital signs and the nursing notes.  Differential diagnosis includes, but is not limited to, status epilepticus, postictal, stroke, cardiac dysrhythmia  {Patient presents with symptoms of an acute illness or injury that is potentially life-threatening.  Patient with longstanding seizure disorder presents after breakthrough seizure.  No signs of traumatic pathology or status epilepticus.  Normal CBC and metabolic panel.  Subtherapeutic carbamazepine level.   My biggest concern is her apparent aphasia and word finding difficulties.  The more I speak with her the more unusual her responses seem to be.  Her MRI brain does not show evidence of acute stroke.  Possibly metabolic encephalopathy, uncertain.  Uncertain chronicity.  Very poor historian and not much history from EMS or the facility.  I consult with hospitalist who agrees to admit.  Clinical Course as of 06/07/23 0419  Fri Jun 07, 2023  0211 Reassessed.  Clinically similar.  We discussed reassuring MRI.  She still cannot  tell me if her word finding difficulties are typical or not.  I am concerned as I review her chart, including a recent neurology visit, there is no indication of word-finding difficulties [DS]  0219 I consult with Dr. Para March who agrees to admit [DS]    Clinical Course User Index [DS] Delton Prairie, MD     FINAL CLINICAL IMPRESSION(S) / ED DIAGNOSES   Final diagnoses:  Seizure (HCC)  Word finding difficulty     Rx / DC Orders   ED Discharge Orders     None        Note:  This document  was prepared using Dragon voice recognition software and may include unintentional dictation errors.   Delton Prairie, MD 06/07/23 [phone]

## 2023-06-06 NOTE — ED Notes (Signed)
First nurse note-pt brought in via ems from the [facility] with a seizure.  Ems reports several seizures last few days.  Pt in wheelchair in lobby.   Bp 145/77, p-64, oxygen sats 98% per ems

## 2023-06-07 ENCOUNTER — Ambulatory Visit: Payer: Medicare Other

## 2023-06-07 ENCOUNTER — Encounter: Payer: Self-pay | Admitting: Radiology

## 2023-06-07 ENCOUNTER — Emergency Department: Payer: Medicare Other

## 2023-06-07 DIAGNOSIS — G40919 Epilepsy, unspecified, intractable, without status epilepticus: Secondary | ICD-10-CM | POA: Diagnosis not present

## 2023-06-07 LAB — CBG MONITORING, ED: Glucose-Capillary: 89 mg/dL (ref 70–99)

## 2023-06-07 LAB — CBC WITH DIFFERENTIAL/PLATELET
Abs Immature Granulocytes: 0.01 10*3/uL (ref 0.00–0.07)
Basophils Absolute: 0 10*3/uL (ref 0.0–0.1)
Basophils Relative: 0 %
Eosinophils Absolute: 0.1 10*3/uL (ref 0.0–0.5)
Eosinophils Relative: 2 %
HCT: 38.4 % (ref 36.0–46.0)
Hemoglobin: 13 g/dL (ref 12.0–15.0)
Immature Granulocytes: 0 %
Lymphocytes Relative: 10 %
Lymphs Abs: 0.8 10*3/uL (ref 0.7–4.0)
MCH: 34.1 pg — ABNORMAL HIGH (ref 26.0–34.0)
MCHC: 33.9 g/dL (ref 30.0–36.0)
MCV: 100.8 fL — ABNORMAL HIGH (ref 80.0–100.0)
Monocytes Absolute: 0.5 10*3/uL (ref 0.1–1.0)
Monocytes Relative: 6 %
Neutro Abs: 6.3 10*3/uL (ref 1.7–7.7)
Neutrophils Relative %: 82 %
Platelets: 248 10*3/uL (ref 150–400)
RBC: 3.81 MIL/uL — ABNORMAL LOW (ref 3.87–5.11)
RDW: 13.4 % (ref 11.5–15.5)
WBC: 7.7 10*3/uL (ref 4.0–10.5)
nRBC: 0 % (ref 0.0–0.2)

## 2023-06-07 LAB — PHENYTOIN LEVEL, TOTAL: Phenytoin Lvl: 15.5 ug/mL (ref 10.0–20.0)

## 2023-06-07 MED ORDER — LAMOTRIGINE 100 MG PO TABS
300.0000 mg | ORAL_TABLET | Freq: Every day | ORAL | Status: DC
  Administered 2023-06-07 – 2023-06-09 (×3): 300 mg via ORAL
  Filled 2023-06-07 (×3): qty 3

## 2023-06-07 MED ORDER — ALBUTEROL SULFATE (2.5 MG/3ML) 0.083% IN NEBU: 2.5000 mg | INHALATION_SOLUTION | RESPIRATORY_TRACT | Status: DC | PRN

## 2023-06-07 MED ORDER — ORAL CARE MOUTH RINSE
15.0000 mL | OROMUCOSAL | Status: DC
  Filled 2023-06-07 (×17): qty 15

## 2023-06-07 MED ORDER — LAMOTRIGINE 100 MG PO TABS: 200.0000 mg | ORAL_TABLET | Freq: Two times a day (BID) | ORAL | Status: DC

## 2023-06-07 MED ORDER — LAMOTRIGINE 25 MG PO TABS: 150.0000 mg | ORAL_TABLET | Freq: Two times a day (BID) | ORAL | Status: DC

## 2023-06-07 MED ORDER — LEVETIRACETAM IN NACL 1000 MG/100ML IV SOLN
1000.0000 mg | Freq: Two times a day (BID) | INTRAVENOUS | Status: DC
  Administered 2023-06-07 – 2023-06-08 (×3): 1000 mg via INTRAVENOUS
  Filled 2023-06-07 (×3): qty 100

## 2023-06-07 MED ORDER — CARBAMAZEPINE 100 MG/5ML PO SUSP
150.0000 mg | Freq: Two times a day (BID) | ORAL | Status: DC
  Administered 2023-06-07 – 2023-06-10 (×7): 150 mg via ORAL
  Filled 2023-06-07: qty 10
  Filled 2023-06-07 (×5): qty 7.5
  Filled 2023-06-07 (×2): qty 10
  Filled 2023-06-07: qty 7.5

## 2023-06-07 MED ORDER — ACETAMINOPHEN 650 MG RE SUPP: 650.0000 mg | RECTAL | Status: DC | PRN

## 2023-06-07 MED ORDER — ONDANSETRON HCL 4 MG/2ML IJ SOLN: 4.0000 mg | Freq: Four times a day (QID) | INTRAMUSCULAR | Status: DC | PRN

## 2023-06-07 MED ORDER — DULOXETINE HCL 20 MG PO CPEP
40.0000 mg | ORAL_CAPSULE | Freq: Two times a day (BID) | ORAL | Status: DC
  Administered 2023-06-07 – 2023-06-10 (×6): 40 mg via ORAL
  Filled 2023-06-07 (×6): qty 2

## 2023-06-07 MED ORDER — ORAL CARE MOUTH RINSE: 15.0000 mL | OROMUCOSAL | Status: DC | PRN

## 2023-06-07 MED ORDER — MOMETASONE FURO-FORMOTEROL FUM 200-5 MCG/ACT IN AERO
2.0000 | INHALATION_SPRAY | Freq: Two times a day (BID) | RESPIRATORY_TRACT | Status: DC
  Administered 2023-06-08 – 2023-06-10 (×5): 2 via RESPIRATORY_TRACT
  Filled 2023-06-07 (×2): qty 8.8

## 2023-06-07 MED ORDER — PHENYTOIN SODIUM EXTENDED 100 MG PO CAPS
100.0000 mg | ORAL_CAPSULE | Freq: Two times a day (BID) | ORAL | Status: DC
  Administered 2023-06-07 – 2023-06-10 (×6): 100 mg via ORAL
  Filled 2023-06-07 (×6): qty 1

## 2023-06-07 MED ORDER — ACETAMINOPHEN 325 MG PO TABS
650.0000 mg | ORAL_TABLET | ORAL | Status: DC | PRN
  Administered 2023-06-07 – 2023-06-08 (×2): 650 mg via ORAL
  Filled 2023-06-07 (×4): qty 2

## 2023-06-07 MED ORDER — LEVETIRACETAM 500 MG PO TABS: 1000.0000 mg | ORAL_TABLET | Freq: Every day | ORAL | Status: DC

## 2023-06-07 MED ORDER — ENOXAPARIN SODIUM 40 MG/0.4ML IJ SOSY
40.0000 mg | PREFILLED_SYRINGE | INTRAMUSCULAR | Status: DC
  Administered 2023-06-07 – 2023-06-10 (×4): 40 mg via SUBCUTANEOUS
  Filled 2023-06-07 (×4): qty 0.4

## 2023-06-07 MED ORDER — PANTOPRAZOLE SODIUM 20 MG PO TBEC
20.0000 mg | DELAYED_RELEASE_TABLET | Freq: Every day | ORAL | Status: DC
  Administered 2023-06-08 – 2023-06-10 (×3): 20 mg via ORAL
  Filled 2023-06-07 (×4): qty 1

## 2023-06-07 MED ORDER — LORAZEPAM 2 MG/ML IJ SOLN: 2.0000 mg | INTRAMUSCULAR | Status: DC | PRN

## 2023-06-07 MED ORDER — LEVETIRACETAM 250 MG PO TABS: 250.0000 mg | ORAL_TABLET | Freq: Every day | ORAL | Status: DC

## 2023-06-07 MED ORDER — LAMOTRIGINE 100 MG PO TABS
200.0000 mg | ORAL_TABLET | Freq: Every day | ORAL | Status: DC
  Administered 2023-06-08 – 2023-06-10 (×3): 200 mg via ORAL
  Filled 2023-06-07 (×3): qty 2

## 2023-06-07 MED ORDER — GABAPENTIN 100 MG PO CAPS
200.0000 mg | ORAL_CAPSULE | Freq: Every day | ORAL | Status: DC
  Administered 2023-06-07 – 2023-06-09 (×3): 200 mg via ORAL
  Filled 2023-06-07 (×3): qty 2

## 2023-06-07 MED ORDER — QUETIAPINE FUMARATE 25 MG PO TABS
25.0000 mg | ORAL_TABLET | Freq: Every day | ORAL | Status: DC
  Administered 2023-06-07 – 2023-06-09 (×3): 25 mg via ORAL
  Filled 2023-06-07 (×3): qty 1

## 2023-06-07 MED ORDER — LEVETIRACETAM 500 MG PO TABS: 500.0000 mg | ORAL_TABLET | Freq: Two times a day (BID) | ORAL | Status: DC

## 2023-06-07 MED ORDER — LACOSAMIDE 150 MG PO TABS: 1.0000 | ORAL_TABLET | Freq: Two times a day (BID) | ORAL | Status: DC

## 2023-06-07 MED ORDER — ALBUTEROL SULFATE HFA 108 (90 BASE) MCG/ACT IN AERS: 2.0000 | INHALATION_SPRAY | RESPIRATORY_TRACT | Status: DC | PRN

## 2023-06-07 MED ORDER — LEVETIRACETAM 500 MG PO TABS: 500.0000 mg | ORAL_TABLET | Freq: Every day | ORAL | Status: DC

## 2023-06-07 MED ORDER — ROSUVASTATIN CALCIUM 10 MG PO TABS
5.0000 mg | ORAL_TABLET | Freq: Every day | ORAL | Status: DC
  Administered 2023-06-08 – 2023-06-10 (×3): 5 mg via ORAL
  Filled 2023-06-07 (×4): qty 1

## 2023-06-07 MED ORDER — ONDANSETRON HCL 4 MG PO TABS: 4.0000 mg | ORAL_TABLET | Freq: Four times a day (QID) | ORAL | Status: DC | PRN

## 2023-06-07 NOTE — ED Notes (Signed)
Pt reporting she has to urinate. BM noted in brief. Pt cleaned and placed on bedpan. Pt stating forgetting she has to urinate. New brief ion place and pt repositioned.

## 2023-06-07 NOTE — ED Notes (Addendum)
Patient ripped out IV and removed self from monitor completely. New INT placed. Patient replaced onto monitor. Bed alarm re-set. Patient placed into mitts to prevent further harm and delay in care. Purewick placed for comfort due to AMS.

## 2023-06-07 NOTE — Progress Notes (Signed)
Courtesy note:  Patient is seen and examined today morning.  She is admitted due to multiple seizures over the past several days.  Patient is restarted on her home seizure medications, Ativan as needed.  EEG ordered, neurology consulted.  Patient is lying comfortably, has mittens, does have confusion.  RN notified concern about swallow.  SLP ordered.  Keppra changed to IV.  Will follow-up neurology recommendations. Anticipate early discharge if no episodes of seizures.

## 2023-06-07 NOTE — ED Notes (Signed)
EEG at bedside.

## 2023-06-07 NOTE — Progress Notes (Signed)
Eeg done 

## 2023-06-07 NOTE — Consult Note (Signed)
NEUROLOGY CONSULT NOTE   Date of service: June 07, 2023 Patient Name: Marcia Brown MRN:  [id number] DOB:  10-28-1955 Chief Complaint: "Seizure" Requesting Provider: Marcelino Duster, MD  History of Present Illness  Marcia Brown is a 67 y.o. female  has a past medical history of Anxiety, Asthma, Cerebral ischemia, COPD (chronic obstructive pulmonary disease) (HCC), Depression, High cholesterol, Polyneuropathy, Seizures (HCC), and Sleep apnea.   She presents with breakthrough seizures.  At baseline she is on five antiepileptics including Tegretol 150 twice daily, Lamotrigine 200-300, Keppra [phone], phenytoin 100 mg twice daily, Vimpat 150 twice daily.  In addition to this she takes tramadol 50 3 times daily.  She presents with breakthrough seizures, which occurred yesterday evening with subsequent confusion.  Her mental status has improved compared to what was described by the staff, but she remains somewhat confused.    Past History   Past Medical History:  Diagnosis Date   Anxiety    Asthma    Cerebral ischemia    COPD (chronic obstructive pulmonary disease) (HCC)    Depression    High cholesterol    Polyneuropathy    Seizures (HCC)    Sleep apnea     Past Surgical History:  Procedure Laterality Date   ESOPHAGEAL DILATION     FOOT SURGERY Right    OPEN REDUCTION INTERNAL FIXATION (ORIF) DISTAL RADIAL FRACTURE Right 11/12/2022   Procedure: OPEN REDUCTION INTERNAL FIXATION (ORIF) DISTAL RADIUS FRACTURE;  Surgeon: Deeann Saint, MD;  Location: [facility] ORS;  Service: Orthopedics;  Laterality: Right;    Family History: Family History  Problem Relation Age of Onset   Breast cancer Daughter 49    Social History  reports that she has been smoking cigarettes. She has never used smokeless tobacco. She reports that she does not drink alcohol and does not use drugs.  Allergies  Allergen Reactions   Ciprofloxacin Other (See Comments)    Other Reaction: Lowered seizure  threshold   Aspirin Other (See Comments)    Reaction: Unknown    Medications   Current Facility-Administered Medications:    acetaminophen (TYLENOL) tablet 650 mg, 650 mg, Oral, Q4H PRN **OR** acetaminophen (TYLENOL) suppository 650 mg, 650 mg, Rectal, Q4H PRN, Andris Baumann, MD   albuterol (PROVENTIL) (2.5 MG/3ML) 0.083% nebulizer solution 2.5 mg, 2.5 mg, Nebulization, Q4H PRN, Otelia Sergeant, RPH   carBAMazepine (TEGRETOL) 100 MG/5ML suspension 150 mg, 150 mg, Oral, Q12H, Rejeana Brock, MD, 150 mg at 06/07/23 1158   DULoxetine (CYMBALTA) DR capsule 40 mg, 40 mg, Oral, BID, Andris Baumann, MD   enoxaparin (LOVENOX) injection 40 mg, 40 mg, Subcutaneous, Q24H, Lindajo Royal V, MD, 40 mg at 06/07/23 1038   lacosamide (VIMPAT) tablet 150 mg, 150 mg, Oral, BID, Delton Prairie, MD, 150 mg at 06/07/23 0107   lamoTRIgine (LAMICTAL) tablet 200 mg, 200 mg, Oral, Daily, Foye Deer, RPH   lamoTRIgine (LAMICTAL) tablet 300 mg, 300 mg, Oral, QHS, Foye Deer, RPH   levETIRAcetam (KEPPRA) IVPB 1000 mg/100 mL premix, 1,000 mg, Intravenous, Q12H, Sreeram, Narendranath, MD, Stopped at 06/07/23 1153   LORazepam (ATIVAN) injection 2 mg, 2 mg, Intravenous, Q5 Min x 2 PRN, Andris Baumann, MD   mometasone-formoterol (DULERA) 200-5 MCG/ACT inhaler 2 puff, 2 puff, Inhalation, BID, Andris Baumann, MD   ondansetron (ZOFRAN) tablet 4 mg, 4 mg, Oral, Q6H PRN **OR** ondansetron (ZOFRAN) injection 4 mg, 4 mg, Intravenous, Q6H PRN, Andris Baumann, MD   Oral care mouth rinse,  15 mL, Mouth Rinse, Q2H, Andris Baumann, MD   Oral care mouth rinse, 15 mL, Mouth Rinse, PRN, Andris Baumann, MD   pantoprazole (PROTONIX) EC tablet 20 mg, 20 mg, Oral, Daily, Andris Baumann, MD   phenytoin (DILANTIN) ER capsule 100 mg, 100 mg, Oral, BID, Andris Baumann, MD   QUEtiapine (SEROQUEL) tablet 25 mg, 25 mg, Oral, QHS, Andris Baumann, MD   rosuvastatin (CRESTOR) tablet 5 mg, 5 mg, Oral, Daily, Andris Baumann,  MD  Current Outpatient Medications:    acetaminophen (TYLENOL) 325 MG tablet, Take 650 mg by mouth in the morning, at noon, and at bedtime. Take 650 mg by mouth 3 (three) times daily GIVEN WITH TRAMADOL 0800/1400/2000, Disp: , Rfl:    azelastine (OPTIVAR) 0.05 % ophthalmic solution, Place 1 drop into both eyes 2 (two) times daily., Disp: , Rfl:    budesonide-formoterol (SYMBICORT) 160-4.5 MCG/ACT inhaler, Inhale 2 puffs into the lungs 2 (two) times daily., Disp: , Rfl:    carbamazepine (TEGRETOL) 100 MG chewable tablet, Chew 150 mg by mouth in the morning and at bedtime., Disp: , Rfl:    celecoxib (CELEBREX) 100 MG capsule, Take 100 mg by mouth 2 (two) times daily., Disp: , Rfl:    cholecalciferol (VITAMIN D) 400 UNITS TABS tablet, Take 400 Units by mouth daily., Disp: , Rfl:    clopidogrel (PLAVIX) 75 MG tablet, Take 75 mg by mouth daily., Disp: , Rfl:    docusate sodium (COLACE) 50 MG capsule, Take 50 mg by mouth daily., Disp: , Rfl:    DULoxetine HCl 40 MG CPEP, Take 40 mg by mouth 2 (two) times daily., Disp: , Rfl:    gabapentin (NEURONTIN) 100 MG capsule, Take 200 mg by mouth at bedtime., Disp: , Rfl:    lamoTRIgine (LAMICTAL) 100 MG tablet, Take 1.5 tablets (150 mg total) by mouth 2 (two) times daily. (Patient taking differently: Take 100 mg by mouth at bedtime.), Disp: 270 tablet, Rfl: 0   lamoTRIgine (LAMICTAL) 200 MG tablet, Take 200 mg by mouth 2 (two) times daily., Disp: , Rfl:    levETIRAcetam (KEPPRA) 500 MG tablet, Take 1 tablet (500 mg total) by mouth 2 (two) times daily. Take one tablet (500) by mouth daily in the morning and one and one-half tablets (750 mg) in the evening (Patient taking differently: Take 500-1,000 mg by mouth 2 (two) times daily. Take one tablet (500) by mouth daily in the morning and two tablets (1000 mg) in the evening), Disp: 180 tablet, Rfl: 0   loratadine (CLARITIN) 10 MG tablet, Take 10 mg by mouth daily., Disp: , Rfl:    losartan (COZAAR) 50 MG tablet, Take  50 mg by mouth daily., Disp: , Rfl:    Multiple Vitamin (MULTIVITAMIN WITH MINERALS) TABS tablet, Take 1 tablet by mouth daily., Disp: , Rfl:    pantoprazole (PROTONIX) 20 MG tablet, Take 20 mg by mouth at bedtime., Disp: , Rfl:    phenytoin (DILANTIN) 100 MG ER capsule, Take 100 mg by mouth 2 (two) times daily. , Disp: , Rfl:    QUEtiapine (SEROQUEL) 25 MG tablet, Take 25 mg by mouth at bedtime., Disp: , Rfl:    rosuvastatin (CRESTOR) 5 MG tablet, Take 5 mg by mouth daily at 8 pm., Disp: , Rfl:    sucralfate (CARAFATE) 1 g tablet, Take 1 g by mouth 2 (two) times daily. Take on empty stomach, Disp: , Rfl:    traMADol (ULTRAM) 50 MG tablet, Take 50  mg by mouth 3 (three) times daily. 0800/1400/2000, Disp: , Rfl:    VIMPAT 150 MG TABS, Take 1 tablet by mouth 2 (two) times daily., Disp: , Rfl:    albuterol (PROVENTIL HFA;VENTOLIN HFA) 108 (90 BASE) MCG/ACT inhaler, Inhale 2 puffs into the lungs every 4 (four) hours as needed for wheezing or shortness of breath., Disp: , Rfl:    alum & mag hydroxide-simeth (MAALOX/MYLANTA) 200-200-20 MG/5ML suspension, Take 30 mLs by mouth every 6 (six) hours as needed for indigestion or heartburn., Disp: , Rfl:    diphenhydrAMINE (BENADRYL) 25 MG tablet, Take 25 mg by mouth every 6 (six) hours as needed for allergies (allergic reaction)., Disp: , Rfl:    guaiFENesin (ROBITUSSIN) 100 MG/5ML liquid, Take 15 mLs by mouth every 6 (six) hours as needed for cough., Disp: , Rfl:    hydrocortisone cream 1 %, Apply 1 Application topically daily as needed (skin rash/ irritation)., Disp: , Rfl:    loperamide (IMODIUM) 2 MG capsule, Take 4 mg by mouth as needed for diarrhea or loose stools., Disp: , Rfl:    magnesium hydroxide (MILK OF MAGNESIA) 400 MG/5ML suspension, Take 30 mLs by mouth daily as needed for mild constipation or moderate constipation., Disp: , Rfl:    sodium chloride (OCEAN) 0.65 % nasal spray, Place 2 sprays into the nose 2 (two) times daily as needed (dry  nose/epitaxis). 2 Spray(s) Both Nares Twice Daily PRN for dry nose/epistaxis, Disp: , Rfl:   Vitals   Vitals:   07/02/2023 0720 02-Jul-2023 1000 2023-07-02 1100 02-Jul-2023 1200  BP: 125/73 114/62 119/86 105/65  Pulse: 87 89 81 84  Resp: 16 17 19  (!) 21  Temp:   98 F (36.7 C)   TempSrc:   Axillary   SpO2: 100% 99% 100% 100%  Weight:      Height:        Body mass index is 29.29 kg/m.  Physical Exam   Constitutional: Appears well-developed and well-nourished.   Neurologic Examination    Neuro: Mental Status: Patient is awake, alert, oriented to person only, she appears to have a mild expressive aphasia but is able to name most objects Cranial Nerves: II: She gives inconsistent responses but I suspect that her visual fields are full. Pupils are equal, round, and reactive to light.   III,IV, VI: EOMI without ptosis or diploplia.  V: Facial sensation is symmetric to temperature VII: Facial movement with some right facial weakness.  VIII: hearing is intact to voice X: Uvula elevates symmetrically XII: tongue is midline without atrophy or fasciculations.  Motor: She is able to lift all extremities against gravity, though she does have weakness in the right arm and leg Sensory: Sensation is diminished on the right Cerebellar: Does not perform        Labs/Imaging/Neurodiagnostic studies   CBC:  Recent Labs  Lab 07/02/23 0054  WBC 7.7  NEUTROABS 6.3  HGB 13.0  HCT 38.4  MCV 100.8*  PLT 248   Basic Metabolic Panel:  Lab Results  Component Value Date   NA 137 06/06/2023   K 3.9 06/06/2023   CO2 21 (L) 06/06/2023   GLUCOSE 89 06/06/2023   BUN 13 06/06/2023   CREATININE 0.77 06/06/2023   CALCIUM 9.2 06/06/2023   GFRNONAA >60 06/06/2023   GFRAA >60 11/27/2019   Lipid Panel: No results found for: "LDLCALC" HgbA1c:  Lab Results  Component Value Date   HGBA1C 5.5 06/26/2019   Urine Drug Screen:     Component Value  Date/Time   LABOPIA NONE DETECTED 11/14/2016  1319   LABOPIA NONE DETECTED 11/05/2010 2103   COCAINSCRNUR NONE DETECTED 11/14/2016 1319   LABBENZ NONE DETECTED 11/14/2016 1319   LABBENZ POSITIVE (A) 11/05/2010 2103   AMPHETMU NONE DETECTED 11/14/2016 1319   AMPHETMU NONE DETECTED 11/05/2010 2103   THCU NONE DETECTED 11/14/2016 1319   THCU NONE DETECTED 11/05/2010 2103   LABBARB NONE DETECTED 11/14/2016 1319   LABBARB  11/05/2010 2103    NONE DETECTED        DRUG SCREEN FOR MEDICAL PURPOSES ONLY.  IF CONFIRMATION IS NEEDED FOR ANY PURPOSE, NOTIFY LAB WITHIN 5 DAYS.        LOWEST DETECTABLE LIMITS FOR URINE DRUG SCREEN Drug Class       Cutoff (ng/mL) Amphetamine      1000 Barbiturate      200 Benzodiazepine   200 Tricyclics       300 Opiates          300 Cocaine          300 THC              50    Alcohol Level     Component Value Date/Time   ETH <5 11/14/2016 1225   INR  Lab Results  Component Value Date   INR 1.2 09/08/2022   APTT  Lab Results  Component Value Date   APTT 38 (H) 09/08/2022   AED levels:  Lab Results  Component Value Date   PHENYTOIN 15.5 06/07/2023   LAMOTRIGINE 1.4 (L) 06/22/2021   LEVETIRACETA 13.2 06/22/2021     MRI Brain(Personally reviewed): R hippocampal volume loss  ASSESSMENT   Marcia Brown is a 67 y.o. female  has a past medical history of Anxiety, Asthma, Cerebral ischemia, COPD (chronic obstructive pulmonary disease) (HCC), Depression, High cholesterol, Polyneuropathy, Seizures (HCC), and Sleep apnea.  She presents with breakthrough seizures with some persistent postictal confusion, but she appears to be improving.  With negative MRI and EEG, I would favor continuing to monitor.  She is on five antiepileptics, as well as a medication that significantly reduces seizure threshold (tramadol).  Rather than adjusting her other five medications, I would favor starting by discontinuing tramadol.  RECOMMENDATIONS  Discontinue tramadol Continue home lacosamide 150 twice  daily Continue home Keppra [phone] Continue home tegretol 150mg  BID Continue home phenytoin 100mg  BId Continue home lamotrigine 200-300 Conitnue home gabapentin 200mg  at bedtime Will follow ______________________________________________________________________    Stormy Card, MD [facility]

## 2023-06-07 NOTE — ED Notes (Signed)
Pt alert to person. Sitting up in bed requesting water. Water provided. Pt denies any other needs. Pt reports that she is confused about what has happened today. Explained to patient events of today and patient agreeable. Pt remains within sight of nurses station and encouraged to stay in bed.

## 2023-06-07 NOTE — ED Notes (Signed)
Lab called for blood draw.

## 2023-06-07 NOTE — ED Notes (Signed)
This RN and 2nd RN at bedside due to pt attempting to get oyut of bed. Pt with soiled brief. Pt cleaned and new brief in place. Pt repositioned.

## 2023-06-07 NOTE — Evaluation (Signed)
Clinical/Bedside Swallow Evaluation Patient Details  Name: Marcia Brown MRN: [id number] Date of Birth: May 18, 1956  Today's Date: 06/07/2023 Time: SLP Start Time (ACUTE ONLY): 1030 SLP Stop Time (ACUTE ONLY): 1045 SLP Time Calculation (min) (ACUTE ONLY): 15 min  Past Medical History:  Past Medical History:  Diagnosis Date   Anxiety    Asthma    Cerebral ischemia    COPD (chronic obstructive pulmonary disease) (HCC)    Depression    High cholesterol    Polyneuropathy    Seizures (HCC)    Sleep apnea    Past Surgical History:  Past Surgical History:  Procedure Laterality Date   ESOPHAGEAL DILATION     FOOT SURGERY Right    OPEN REDUCTION INTERNAL FIXATION (ORIF) DISTAL RADIAL FRACTURE Right 11/12/2022   Procedure: OPEN REDUCTION INTERNAL FIXATION (ORIF) DISTAL RADIUS FRACTURE;  Surgeon: Deeann Saint, MD;  Location: [facility] ORS;  Service: Orthopedics;  Laterality: Right;   HPI:  Per H&P, "Marcia Brown is a 67 y.o. female with medical history significant for Prior CVA, COPD, temporal lobe epilepsy and dizziness on multiple AEDs,, dementia, anxiety who was sent from her facility with a breakthrough seizure.  She apparently has had several seizures over the past few days.  Patient is awake and alert but is oriented to person only and appears confused and unable to contribute to history."    Assessment / Plan / Recommendation  Clinical Impression  Pt seen for clinical swallowing evaluation. Pt alert, confusion evident. B mitts donned upon SLP entrance to room. Pt with difficulty participating in full oral motor exam given inconsistent ability to follow commands. R facial droop, reduced lingual ROM bilaterally, and xerostomia appreciated. Pt with +upper denture and edentulous lower arch. Pt presents with s/sx moderate oral dysphagia given prolonged/inefficient mastication and residual with solids likely due to dental status and exacerbated by mental status and xerostomia. Recommend initiation  of a puree diet with thin liquids with safe swallowing strategies/aspiration precautions as outlined below. Pt would benefit from 1:1 assistance with feeding. SLP to f/u per POC for diet tolerance and trials of upgraded textures as appropriate.   SLP Visit Diagnosis: Dysphagia, oral phase (R13.11)    Aspiration Risk  Mild aspiration risk    Diet Recommendation Dysphagia 1 (Puree);Thin liquid    Liquid Administration via: Spoon;Cup;Straw Medication Administration: Whole meds with liquid (vs with puree; as tolerated) Supervision: Staff to assist with self feeding;Full supervision/cueing for compensatory strategies Compensations: Slow rate;Small sips/bites;Minimize environmental distractions Postural Changes: Seated upright at 90 degrees;Remain upright for at least 30 minutes after po intake    Other  Recommendations Oral Care Recommendations: Oral care BID;Staff/trained caregiver to provide oral care    Recommendations for follow up therapy are one component of a multi-disciplinary discharge planning process, led by the attending physician.  Recommendations may be updated based on patient status, additional functional criteria and insurance authorization.  Follow up Recommendations Follow physician's recommendations for discharge plan and follow up therapies         Functional Status Assessment Patient has had a recent decline in their functional status and demonstrates the ability to make significant improvements in function in a reasonable and predictable amount of time.  Frequency and Duration min 2x/week  2 weeks       Prognosis Prognosis for improved oropharyngeal function: Good Barriers to Reach Goals: Cognitive deficits      Swallow Study   General Date of Onset: 06/06/23 HPI: Per H&P, "Marcia Brown is a  67 y.o. female with medical history significant for Prior CVA, COPD, temporal lobe epilepsy and dizziness on multiple AEDs,, dementia, anxiety who was sent from her facility  with a breakthrough seizure.  She apparently has had several seizures over the past few days.  Patient is awake and alert but is oriented to person only and appears confused and unable to contribute to history." Type of Study: MBS-Modified Barium Swallow Study Previous Swallow Assessment: none Diet Prior to this Study: NPO Temperature Spikes Noted: No Respiratory Status: Room air History of Recent Intubation: No Behavior/Cognition: Alert;Confused;Distractible;Requires cueing Oral Cavity Assessment: Within Functional Limits Oral Care Completed by SLP: Yes Oral Cavity - Dentition: Dentures, top;Missing dentition (edentulous lower arch) Vision: Functional for self-feeding Self-Feeding Abilities: Total assist Patient Positioning: Upright in bed Baseline Vocal Quality: Normal Volitional Cough: Cognitively unable to elicit Volitional Swallow: Unable to elicit    Oral/Motor/Sensory Function Overall Oral Motor/Sensory Function: Moderate impairment Facial ROM: Reduced right Facial Symmetry: Abnormal symmetry right Lingual ROM: Reduced right;Reduced left   Ice Chips Ice chips: Within functional limits   Thin Liquid Thin Liquid: Within functional limits Presentation: Straw Other Comments: 3 oz water challenge    Nectar Thick Nectar Thick Liquid: Not tested   Honey Thick Honey Thick Liquid: Not tested   Puree Puree: Within functional limits Presentation: Spoon   Solid     Solid: Impaired Oral Phase Impairments: Reduced lingual movement/coordination;Impaired mastication Oral Phase Functional Implications: Impaired mastication;Prolonged oral transit;Oral residue Pharyngeal Phase Impairments:  (WFL)     Clyde Canterbury, M.S., CCC-SLP Speech-Language Pathologist [facility] [phone] (ASCOM)  Alessandra Bevels Catarina Huntley 06/07/2023,11:06 AM

## 2023-06-07 NOTE — ED Notes (Signed)
Pt pulled out IV despite safety

## 2023-06-07 NOTE — Assessment & Plan Note (Signed)
Temporal lobe epilepsy Continue home meds Ativan as needed seizure Neurology consult EEG ordered

## 2023-06-07 NOTE — Procedures (Signed)
History: 67 year old female with a history of seizures presenting with breakthrough seizures  Sedation: None  Patient State: Awake and drowsy  Technique: This EEG was acquired with electrodes placed according to the International 10-20 electrode system (including Fp1, Fp2, F3, F4, C3, C4, P3, P4, O1, O2, T3, T4, T5, T6, A1, A2, Fz, Cz, Pz). The following electrodes were missing or displaced: none.   Background: The background consists of intermixed alpha and beta activities. There is a well defined posterior dominant rhythm of 10 Hz that attenuates with eye opening.  There is generalized irregular delta and theta activity intruding in the background throughout the study.  Photic stimulation: Physiologic driving is not performed  EEG Abnormalities: Generalized irregular slow activity  Clinical Interpretation: This EEG is consistent with a generalized nonspecific cerebral dysfunction (encephalopathy). There was no seizure or seizure predisposition recorded on this study. Please note that lack of epileptiform activity on EEG does not preclude the possibility of epilepsy.   Ritta Slot, MD [facility] [phone]  If 7pm- 7am, please page neurology on call as listed in [facility].

## 2023-06-07 NOTE — H&P (Signed)
History and Physical    Patient: Marcia Brown ZOX:[id number] DOB: Mar 26, 1956 DOA: 06/06/2023 DOS: the patient was seen and examined on 06/07/2023 PCP: [facility]  Patient coming from: ALF/ILF  Chief Complaint:  Chief Complaint  Patient presents with   Seizures   Fall    Plavix    HPI: Marcia Brown is a 67 y.o. female with medical history significant for Prior CVA, COPD, temporal lobe epilepsy and dizziness on multiple AEDs,, dementia, anxiety who was sent from her facility with a breakthrough seizure.  She apparently has had several seizures over the past few days.  Patient is awake and alert but is oriented to person only and appears confused and unable to contribute to history. ED course and data review: Vitals unremarkable and labs unrevealing except for Tegretol level less than 2. EKG with NSR and no concerning findings CT head, CT C-spine and MRI brain with no acute findings Patient given her home doses of Vimpat, Lamictal, Keppra, Dilantin and Tegretol Observation requested     Past Medical History:  Diagnosis Date   Anxiety    Asthma    Cerebral ischemia    COPD (chronic obstructive pulmonary disease) (HCC)    Depression    High cholesterol    Polyneuropathy    Seizures (HCC)    Sleep apnea    Past Surgical History:  Procedure Laterality Date   ESOPHAGEAL DILATION     FOOT SURGERY Right    OPEN REDUCTION INTERNAL FIXATION (ORIF) DISTAL RADIAL FRACTURE Right 11/12/2022   Procedure: OPEN REDUCTION INTERNAL FIXATION (ORIF) DISTAL RADIUS FRACTURE;  Surgeon: Deeann Saint, MD;  Location: [facility] ORS;  Service: Orthopedics;  Laterality: Right;   Social History:  reports that she has been smoking cigarettes. She has never used smokeless tobacco. She reports that she does not drink alcohol and does not use drugs.  Allergies  Allergen Reactions   Ciprofloxacin Other (See Comments)    Other Reaction: Lowered seizure threshold   Aspirin Other (See Comments)     Reaction: Unknown    Family History  Problem Relation Age of Onset   Breast cancer Daughter 65    Prior to Admission medications   Medication Sig Start Date End Date Taking? Authorizing Provider  acetaminophen (TYLENOL) 325 MG tablet Take 650 mg by mouth in the morning, at noon, and at bedtime. Take 325 mg by mouth 3 (three) times daily GIVEN WITH TRAMADOL 0800/1400/2000    [provider]  albuterol (PROVENTIL HFA;VENTOLIN HFA) 108 (90 BASE) MCG/ACT inhaler Inhale 2 puffs into the lungs every 4 (four) hours as needed for wheezing or shortness of breath.    [provider]  alum & mag hydroxide-simeth (MAALOX/MYLANTA) 200-200-20 MG/5ML suspension Take 30 mLs by mouth every 6 (six) hours as needed for indigestion or heartburn.    [provider]  azelastine (OPTIVAR) 0.05 % ophthalmic solution Apply 1 drop to eye 2 (two) times daily. 04/18/21   [provider]  budesonide-formoterol (SYMBICORT) 160-4.5 MCG/ACT inhaler Inhale 2 puffs into the lungs 2 (two) times daily.    [provider]  carbamazepine (TEGRETOL) 100 MG chewable tablet Chew 150 mg by mouth in the morning and at bedtime. 10/31/22   [provider]  celecoxib (CELEBREX) 100 MG capsule Take 100 mg by mouth 2 (two) times daily. 04/21/21   [provider]  cholecalciferol (VITAMIN D) 400 UNITS TABS tablet Take 400 Units by mouth daily. Give with Vitamin D3 2000 units Patient not taking:  Reported on 11/12/2022    [provider]  clopidogrel (PLAVIX) 75 MG tablet Take 75 mg by mouth daily. 06/19/19   [provider]  docusate sodium (COLACE) 50 MG capsule Take 50 mg by mouth daily.    [provider]  DULoxetine (CYMBALTA) 20 MG capsule Take 40 mg by mouth 2 (two) times daily.    [provider]  gabapentin (NEURONTIN) 100 MG capsule Take 200 mg by mouth at bedtime.    [provider]  guaiFENesin (ROBITUSSIN) 100 MG/5ML liquid  Take 15 mLs by mouth every 6 (six) hours as needed for cough.    [provider]  lamoTRIgine (LAMICTAL) 100 MG tablet Take 1.5 tablets (150 mg total) by mouth 2 (two) times daily. Patient taking differently: Take 100 mg by mouth at bedtime. 09/06/20 11/12/22  Gilles Chiquito, MD  lamoTRIgine (LAMICTAL) 200 MG tablet Take 200 mg by mouth 2 (two) times daily.    [provider]  levETIRAcetam (KEPPRA) 250 MG tablet Take 250 mg by mouth at bedtime. 10/31/22   [provider]  levETIRAcetam (KEPPRA) 500 MG tablet Take 1 tablet (500 mg total) by mouth 2 (two) times daily. Take one tablet (500) by mouth daily in the morning and one and one-half tablets (750 mg) in the evening 09/06/20 11/12/22  Gilles Chiquito, MD  loperamide (IMODIUM) 2 MG capsule Take 4 mg by mouth as needed for diarrhea or loose stools.    [provider]  loratadine (CLARITIN) 10 MG tablet Take 10 mg by mouth daily.    [provider]  magnesium hydroxide (MILK OF MAGNESIA) 400 MG/5ML suspension Take 30 mLs by mouth daily as needed for mild constipation or moderate constipation.    [provider]  Multiple Vitamin (MULTIVITAMIN WITH MINERALS) TABS tablet Take 1 tablet by mouth daily.    [provider]  pantoprazole (PROTONIX) 20 MG tablet Take 20 mg by mouth daily. 10/31/22   [provider]  phenytoin (DILANTIN) 100 MG ER capsule Take 100 mg by mouth 2 (two) times daily.     [provider]  QUEtiapine (SEROQUEL) 25 MG tablet Take 25 mg by mouth at bedtime. 06/19/19   [provider]  rosuvastatin (CRESTOR) 5 MG tablet Take 5 mg by mouth daily.    [provider]  sodium chloride (OCEAN) 0.65 % nasal spray Place 2 sprays into the nose as needed. 2 Spray(s) Both Nares Twice Daily PRN for dry nose/epistaxis 08/08/22   [provider]  sucralfate (CARAFATE) 1 g tablet Take 1 g by mouth 2 (two) times daily. Take on empty stomach     [provider]  traMADol (ULTRAM) 50 MG tablet Take 50 mg by mouth 3 (three) times daily. 0800/1400/2000    [provider]  traMADol (ULTRAM) 50 MG tablet Take 1 tablet (50 mg total) by mouth every 6 (six) hours as needed. 01/03/23 01/03/24  Willy Eddy, MD  VIMPAT 150 MG TABS Take 1 tablet by mouth 2 (two) times daily. 06/03/19   [provider]    Physical Exam: Vitals:   06/07/23 0114 06/07/23 0130 06/07/23 0230 06/07/23 0235  BP: 115/70 116/69 122/72   Pulse: 76 77 82   Resp: 14 18 10    Temp: (!) 97.1 F (36.2 C)   98.3 F (36.8 C)  TempSrc: Axillary   Oral  SpO2: 100%     Weight:      Height:  Physical Exam Vitals and nursing note reviewed.  Constitutional:      General: She is not in acute distress. HENT:     Head: Normocephalic and atraumatic.  Cardiovascular:     Rate and Rhythm: Normal rate and regular rhythm.     Heart sounds: Normal heart sounds.  Pulmonary:     Effort: Pulmonary effort is normal.     Breath sounds: Normal breath sounds.  Abdominal:     Palpations: Abdomen is soft.     Tenderness: There is no abdominal tenderness.  Neurological:     Mental Status: She is disoriented.     Labs on Admission: I have personally reviewed following labs and imaging studies  CBC: Recent Labs  Lab 06/07/23 0054  WBC 7.7  NEUTROABS 6.3  HGB 13.0  HCT 38.4  MCV 100.8*  PLT 248   Basic Metabolic Panel: Recent Labs  Lab 06/06/23 1925  NA 137  K 3.9  CL 103  CO2 21*  GLUCOSE 89  BUN 13  CREATININE 0.77  CALCIUM 9.2   GFR: Estimated Creatinine Clearance: 57.1 mL/min (by C-G formula based on SCr of 0.77 mg/dL). Liver Function Tests: No results for input(s): "AST", "ALT", "ALKPHOS", "BILITOT", "PROT", "ALBUMIN" in the last 168 hours. No results for input(s): "LIPASE", "AMYLASE" in the last 168 hours. No results for input(s): "AMMONIA" in the last 168 hours. Coagulation Profile: No results for input(s): "INR",  "PROTIME" in the last 168 hours. Cardiac Enzymes: No results for input(s): "CKTOTAL", "CKMB", "CKMBINDEX", "TROPONINI" in the last 168 hours. BNP (last 3 results) No results for input(s): "PROBNP" in the last 8760 hours. HbA1C: No results for input(s): "HGBA1C" in the last 72 hours. CBG: Recent Labs  Lab 06/06/23 1932 06/07/23 0152  GLUCAP 93 89   Lipid Profile: No results for input(s): "CHOL", "HDL", "LDLCALC", "TRIG", "CHOLHDL", "LDLDIRECT" in the last 72 hours. Thyroid Function Tests: No results for input(s): "TSH", "T4TOTAL", "FREET4", "T3FREE", "THYROIDAB" in the last 72 hours. Anemia Panel: No results for input(s): "VITAMINB12", "FOLATE", "FERRITIN", "TIBC", "IRON", "RETICCTPCT" in the last 72 hours. Urine analysis:    Component Value Date/Time   COLORURINE YELLOW (A) 12/11/2022 0437   APPEARANCEUR CLEAR (A) 12/11/2022 0437   APPEARANCEUR Clear 03/18/2014 1912   LABSPEC 1.011 12/11/2022 0437   LABSPEC 1.004 03/18/2014 1912   PHURINE 6.0 12/11/2022 0437   GLUCOSEU NEGATIVE 12/11/2022 0437   GLUCOSEU Negative 03/18/2014 1912   HGBUR SMALL (A) 12/11/2022 0437   BILIRUBINUR NEGATIVE 12/11/2022 0437   BILIRUBINUR Negative 03/18/2014 1912   KETONESUR NEGATIVE 12/11/2022 0437   PROTEINUR NEGATIVE 12/11/2022 0437   UROBILINOGEN 0.2 11/06/2010 0900   NITRITE NEGATIVE 12/11/2022 0437   LEUKOCYTESUR NEGATIVE 12/11/2022 0437   LEUKOCYTESUR Negative 03/18/2014 1912    Radiological Exams on Admission: MR BRAIN WO CONTRAST Result Date: 06/07/2023 CLINICAL DATA:  Word-finding difficulties, aphasia, possible seizure EXAM: MRI HEAD WITHOUT CONTRAST TECHNIQUE: Multiplanar, multiecho pulse sequences of the brain and surrounding structures were obtained without intravenous contrast. COMPARISON:  10/03/2022 MRI head, correlation is also made with 06/06/2023 CT head FINDINGS: Evaluation is somewhat limited by motion artifact. Brain: Increased T2 hyperintense signal and asymmetric volume  loss in the right hippocampus (series 7, image 17 and series 8, image 14), which may be related to encephalomalacia in the right frontal lobe or could represent mesial temporal sclerosis. No heterotopia or evidence of cortical dysgenesis. No restricted diffusion to suggest acute or subacute infarct. No acute hemorrhage, mass, mass effect, or midline shift. No  hydrocephalus or extra-axial collection. Partial empty sella. Craniocervical junction within normal limits. No hemosiderin deposition to suggest remote hemorrhage. Cerebral volume within normal limits for age. Scattered T2 hyperintense signal in the periventricular white matter, likely the sequela of mild a chronic small vessel ischemic disease. Vascular: Normal arterial flow voids. Skull and upper cervical spine: Normal marrow signal. Sinuses/Orbits: Mucous retention cyst in the left sphenoid sinus. Mild mucosal thickening in the ethmoid air cells. Status post bilateral lens replacements. No acute finding in the orbits. Other: The mastoid air cells are well aerated. IMPRESSION: 1. No acute intracranial process. No evidence of acute or subacute infarct. 2. Increased T2 hyperintense signal and asymmetric volume loss in the right hippocampus, which may be related to encephalomalacia in the right frontal lobe or could represent mesial temporal sclerosis. Electronically Signed   By: Wiliam Ke M.D.   On: 06/07/2023 01:27   CT Head Wo Contrast Result Date: 06/06/2023 CLINICAL DATA:  Head trauma, minor (Age >= 65y); Facial trauma, blunt EXAM: CT HEAD WITHOUT CONTRAST CT CERVICAL SPINE WITHOUT CONTRAST TECHNIQUE: Multidetector CT imaging of the head and cervical spine was performed following the standard protocol without intravenous contrast. Multiplanar CT image reconstructions of the cervical spine were also generated. RADIATION DOSE REDUCTION: This exam was performed according to the departmental dose-optimization program which includes automated exposure  control, adjustment of the mA and/or kV according to patient size and/or use of iterative reconstruction technique. COMPARISON:  CT head and C-spine 12/11/2022 FINDINGS: CT HEAD FINDINGS Brain: Chronic appearing bilateral subdural hygroma. Lower right anterior temporal encephalomalacia. No evidence of large-territorial acute infarction. No parenchymal hemorrhage. No mass lesion. No extra-axial collection. No mass effect or midline shift. No hydrocephalus. Basilar cisterns are patent. Vascular: No hyperdense vessel. Atherosclerotic calcifications are present within the cavernous internal carotid arteries. Skull: No acute fracture or focal lesion. Likely prior right orbital fracture. Sinuses/Orbits: Left sphenoid sinus mucosal thickening. Otherwise paranasal sinuses and mastoid air cells are clear. The orbits are unremarkable. Other: None. CT CERVICAL SPINE FINDINGS Alignment: Grade 1 anterolisthesis of C3 on C4. Skull base and vertebrae: Multilevel moderate degenerative changes of the spine with associated posterior disc osteophyte complex formation. Severe osseous neural foraminal stenosis of the bilateral C5-C6 osseous neural foramina. No severe osseous central canal stenosis. No acute fracture. No aggressive appearing focal osseous lesion or focal pathologic process. Soft tissues and spinal canal: No prevertebral fluid or swelling. No visible canal hematoma. Upper chest: Unremarkable. Other: Atherosclerotic plaque of the aorta and its main branches. Patient is edentulous. IMPRESSION: 1. No acute intracranial abnormality. 2. No acute displaced fracture or traumatic listhesis of the cervical spine. 3. Multilevel moderate degenerative changes of the spine. Severe osseous neural foraminal stenosis of the bilateral C5-C6 osseous neural foramina. 4.  Aortic Atherosclerosis (ICD10-I70.0). Electronically Signed   By: Tish Frederickson M.D.   On: 06/06/2023 19:54   CT Cervical Spine Wo Contrast Result Date:  06/06/2023 CLINICAL DATA:  Head trauma, minor (Age >= 65y); Facial trauma, blunt EXAM: CT HEAD WITHOUT CONTRAST CT CERVICAL SPINE WITHOUT CONTRAST TECHNIQUE: Multidetector CT imaging of the head and cervical spine was performed following the standard protocol without intravenous contrast. Multiplanar CT image reconstructions of the cervical spine were also generated. RADIATION DOSE REDUCTION: This exam was performed according to the departmental dose-optimization program which includes automated exposure control, adjustment of the mA and/or kV according to patient size and/or use of iterative reconstruction technique. COMPARISON:  CT head and C-spine 12/11/2022 FINDINGS: CT HEAD FINDINGS  Brain: Chronic appearing bilateral subdural hygroma. Lower right anterior temporal encephalomalacia. No evidence of large-territorial acute infarction. No parenchymal hemorrhage. No mass lesion. No extra-axial collection. No mass effect or midline shift. No hydrocephalus. Basilar cisterns are patent. Vascular: No hyperdense vessel. Atherosclerotic calcifications are present within the cavernous internal carotid arteries. Skull: No acute fracture or focal lesion. Likely prior right orbital fracture. Sinuses/Orbits: Left sphenoid sinus mucosal thickening. Otherwise paranasal sinuses and mastoid air cells are clear. The orbits are unremarkable. Other: None. CT CERVICAL SPINE FINDINGS Alignment: Grade 1 anterolisthesis of C3 on C4. Skull base and vertebrae: Multilevel moderate degenerative changes of the spine with associated posterior disc osteophyte complex formation. Severe osseous neural foraminal stenosis of the bilateral C5-C6 osseous neural foramina. No severe osseous central canal stenosis. No acute fracture. No aggressive appearing focal osseous lesion or focal pathologic process. Soft tissues and spinal canal: No prevertebral fluid or swelling. No visible canal hematoma. Upper chest: Unremarkable. Other: Atherosclerotic  plaque of the aorta and its main branches. Patient is edentulous. IMPRESSION: 1. No acute intracranial abnormality. 2. No acute displaced fracture or traumatic listhesis of the cervical spine. 3. Multilevel moderate degenerative changes of the spine. Severe osseous neural foraminal stenosis of the bilateral C5-C6 osseous neural foramina. 4.  Aortic Atherosclerosis (ICD10-I70.0). Electronically Signed   By: Tish Frederickson M.D.   On: 06/06/2023 19:54     Data Reviewed: Relevant notes from primary care and specialist visits, past discharge summaries as available in EHR, including Care Everywhere. Prior diagnostic testing as pertinent to current admission diagnoses Updated medications and problem lists for reconciliation ED course, including vitals, labs, imaging, treatment and response to treatment Triage notes, nursing and pharmacy notes and ED provider's notes Notable results as noted in HPI   Assessment and Plan: * Breakthrough seizure (HCC) Temporal lobe epilepsy Continue home meds Ativan as needed seizure Neurology consult EEG ordered  Anxiety Continue Cymbalta   COPD (chronic obstructive pulmonary disease) (HCC) Stable Continue home inhalers with DuoNebs as needed      DVT prophylaxis: Lovenox  Consults: neurology  Advance Care Planning:   Code Status: Prior   Family Communication: none  Disposition Plan: Back to previous home environment  Severity of Illness: The appropriate patient status for this patient is OBSERVATION. Observation status is judged to be reasonable and necessary in order to provide the required intensity of service to ensure the patient's safety. The patient's presenting symptoms, physical exam findings, and initial radiographic and laboratory data in the context of their medical condition is felt to place them at decreased risk for further clinical deterioration. Furthermore, it is anticipated that the patient will be medically stable for discharge  from the hospital within 2 midnights of admission.   Author: Andris Baumann, MD 06/07/2023 4:16 AM  For on call review [email or web address].

## 2023-06-07 NOTE — ED Notes (Signed)
Pt cleaned

## 2023-06-07 NOTE — ED Notes (Signed)
RN at bedside. Pt slid down in bed. Pt repositioned in bed by this RN and EDT.

## 2023-06-07 NOTE — ED Notes (Signed)
 Pt admitted to [facility]

## 2023-06-07 NOTE — ED Notes (Signed)
Patient attempted to get out of bed, bed alarm going off. This RN entered room to find patient standing at bottom of bed, brief removed with bowel incontinence. Patient moved up to room closer to nursing station. Bed alarm re-set. Patient cleaned and wiped down, new sheets placed on bed.

## 2023-06-07 NOTE — ED Notes (Addendum)
RN at bedside. Pt with purewick in hand and covered in feces. Brief with noted BM. Pt pulled off safety mitts. This RN cleaned pt and new brief/purewick in place. New safety mitts placed.

## 2023-06-07 NOTE — ED Notes (Signed)
This tech and He[place] NT cleaned pt up after a BM. New chux and brief were placed.

## 2023-06-08 ENCOUNTER — Encounter: Payer: Self-pay | Admitting: Internal Medicine

## 2023-06-08 DIAGNOSIS — R5381 Other malaise: Secondary | ICD-10-CM

## 2023-06-08 DIAGNOSIS — G40109 Localization-related (focal) (partial) symptomatic epilepsy and epileptic syndromes with simple partial seizures, not intractable, without status epilepticus: Secondary | ICD-10-CM | POA: Diagnosis not present

## 2023-06-08 DIAGNOSIS — J439 Emphysema, unspecified: Secondary | ICD-10-CM

## 2023-06-08 DIAGNOSIS — G40919 Epilepsy, unspecified, intractable, without status epilepticus: Secondary | ICD-10-CM | POA: Diagnosis not present

## 2023-06-08 DIAGNOSIS — G9341 Metabolic encephalopathy: Secondary | ICD-10-CM

## 2023-06-08 LAB — CBG MONITORING, ED
Glucose-Capillary: 95 mg/dL (ref 70–99)
Glucose-Capillary: 114 mg/dL — ABNORMAL HIGH (ref 70–99)

## 2023-06-08 LAB — TSH: TSH: 1.554 u[IU]/mL (ref 0.350–4.500)

## 2023-06-08 LAB — VITAMIN B12: Vitamin B-12: 200 pg/mL (ref 180–914)

## 2023-06-08 MED ORDER — THIAMINE HCL 100 MG/ML IJ SOLN
500.0000 mg | Freq: Three times a day (TID) | INTRAVENOUS | Status: DC
  Administered 2023-06-08 – 2023-06-09 (×3): 500 mg via INTRAVENOUS
  Filled 2023-06-08 (×5): qty 5

## 2023-06-08 MED ORDER — LEVETIRACETAM 500 MG PO TABS: 500.0000 mg | ORAL_TABLET | Freq: Every day | ORAL | Status: DC

## 2023-06-08 MED ORDER — LEVETIRACETAM 500 MG PO TABS
1000.0000 mg | ORAL_TABLET | Freq: Every day | ORAL | Status: DC
  Administered 2023-06-08 – 2023-06-09 (×2): 1000 mg via ORAL
  Filled 2023-06-08 (×2): qty 2

## 2023-06-08 MED ORDER — LEVETIRACETAM 500 MG PO TABS
500.0000 mg | ORAL_TABLET | Freq: Every day | ORAL | Status: DC
Start: 2023-06-09 — End: 2023-06-10
  Administered 2023-06-09 – 2023-06-10 (×2): 500 mg via ORAL
  Filled 2023-06-08 (×2): qty 1

## 2023-06-08 NOTE — Progress Notes (Signed)
CSW contacted the [place] again and spoke with [place]. CSW was told that the [phone] is the only number they have for the niece Chauncey Cruel. CSW was also told that Myrene Buddy hasn't been active with patient care for quite some time. CSW informed the [facility] that a man answered the [phone] number and stated he was with postal connections and there is no one by the name of Chauncey Cruel there. Dustin told CSW that they couldn't reach any family yesterday and they had to put unable to find family when patient came to the ED. CSW at this time is not able to deliver patients MOON.

## 2023-06-08 NOTE — Progress Notes (Signed)
Pt confused and disoriented. Unable to complete admission profile at this time.

## 2023-06-08 NOTE — Care Management Obs Status (Signed)
MEDICARE OBSERVATION STATUS NOTIFICATION   Patient Details  Name: Marcia Brown MRN: [id number] Date of Birth: January 08, 1956   Medicare Observation Status Notification Given:  Yes    Susa Simmonds, LCSWA 06/08/2023, 2:50 PM

## 2023-06-08 NOTE — Progress Notes (Signed)
CSW unable to complete MOON with patient. Patient is disoriented and has dementia. CSW contacted patients niece Marcia Brown, [phone]. and phone goes straight to voicemail. CSW left a message requesting a call back. CSW contacted [facility] [place] [facility] [place] to see if they have any other family listed. [employer] [facility] provided CSW with [phone]. CSW still unable to reach the niece.

## 2023-06-08 NOTE — Progress Notes (Signed)
Progress Note   Patient: Marcia Brown NWG:[id number] DOB: 11-23-1955 DOA: 06/06/2023     0 DOS: the patient was seen and examined on 06/08/2023   Brief hospital course: Marcia Brown is a 67 y.o. female with medical history significant for Prior CVA, COPD, temporal lobe epilepsy and dizziness on multiple AEDs,  anxiety who was sent from her facility with a breakthrough seizure.  She apparently has had several seizures over the past few days.    Patient is admitted to the hospitalist service for breakthrough seizures, with neurology consultation.  Patient is started on her home antiseizure medications.  Assessment and Plan: * Breakthrough seizure (HCC) Temporal lobe epilepsy Continue home meds Ativan as needed seizure Neurology consult and follow-up appreciated EEG no epileptiform discharges noted. Continue lacosamide 150 mg, IV Keppra changed to oral 500 AM, 1000 p.m., Tegretol 150 twice daily, phenytoin 100 twice daily, lamotrigine 200 a.m., 300 p.m., gabapentin 200 mg at bedtime. Discontinue tramadol. Continue seizure precautions. Continue fall, aspiration precautions. Nursing supportive care.  Acute encephalopathy Possibly hospital related delirium. No prior history of dementia as per facility. Neurology advised thiamine 500 mg 3 times daily. Check B12, TSH.  COPD- Stable. Continue current home inhalers.  Anxiety: On Seroquel therapy.  History of CVA: Continue statin therapy.    DVT prophylaxis Lovenox   Code Status: Full Code  Subjective: Patient is seen and examined today morning.  She is lying comfortably.  More alert and awake, does not remember why she is in the hospital.  Did not get out of bed.  Physical Exam: Vitals:   06/08/23 0402 06/08/23 0712 06/08/23 0800 06/08/23 1109  BP: 130/75 (!) 116/59 (!) 125/112 104/88  Pulse: 75 76 71 71  Resp: 17 18 19 14   Temp: 100 F (37.8 C) 98.1 F (36.7 C)  98.2 F (36.8 C)  TempSrc: Oral Oral  Oral  SpO2: 97%  95% 97% 92%  Weight:      Height:        General - Elderly occasion female, no apparent distress HEENT - PERRLA, EOMI, atraumatic head, non tender sinuses. Lung - Clear, no rales, rhonchi, wheezes. Heart - S1, S2 heard, no murmurs, rubs, trace pedal edema. Abdomen - Soft, non tender, no distention, bowel sounds good Neuro - Alert, awake and oriented x 1, non focal exam. Skin - Warm and dry.  Data Reviewed:      Latest Ref Rng & Units 06/07/2023   12:54 AM 12/11/2022    4:05 AM 11/14/2022   10:59 AM  CBC  WBC 4.0 - 10.5 K/uL 7.7  3.7  5.1   Hemoglobin 12.0 - 15.0 g/dL 57.8  46.9  9.5   Hematocrit 36.0 - 46.0 % 38.4  36.9  28.7   Platelets 150 - 400 K/uL 248  130  156       Latest Ref Rng & Units 06/06/2023    7:25 PM 12/11/2022    5:46 AM 11/13/2022    4:41 AM  BMP  Glucose 70 - 99 mg/dL 89  87  629   BUN 8 - 23 mg/dL 13  14  15    Creatinine 0.44 - 1.00 mg/dL 5.28  4.13  2.44   Sodium 135 - 145 mmol/L 137  136  136   Potassium 3.5 - 5.1 mmol/L 3.9  3.3  4.0   Chloride 98 - 111 mmol/L 103  106  105   CO2 22 - 32 mmol/L 21  23  22  Calcium 8.9 - 10.3 mg/dL 9.2  8.6  8.3    EEG adult Result Date: 06/07/2023 Rejeana Brock, MD     06/07/2023  7:26 PM History: 67 year old female with a history of seizures presenting with breakthrough seizures Sedation: None Patient State: Awake and drowsy Technique: This EEG was acquired with electrodes placed according to the International 10-20 electrode system (including Fp1, Fp2, F3, F4, C3, C4, P3, P4, O1, O2, T3, T4, T5, T6, A1, A2, Fz, Cz, Pz). The following electrodes were missing or displaced: none. Background: The background consists of intermixed alpha and beta activities. There is a well defined posterior dominant rhythm of 10 Hz that attenuates with eye opening.  There is generalized irregular delta and theta activity intruding in the background throughout the study. Photic stimulation: Physiologic driving is not performed EEG  Abnormalities: Generalized irregular slow activity Clinical Interpretation: This EEG is consistent with a generalized nonspecific cerebral dysfunction (encephalopathy). There was no seizure or seizure predisposition recorded on this study. Please note that lack of epileptiform activity on EEG does not preclude the possibility of epilepsy. Ritta Slot, MD [facility] [phone] If 7pm- 7am, please page neurology on call as listed in AMION.  MR BRAIN WO CONTRAST Result Date: 06/07/2023 CLINICAL DATA:  Word-finding difficulties, aphasia, possible seizure EXAM: MRI HEAD WITHOUT CONTRAST TECHNIQUE: Multiplanar, multiecho pulse sequences of the brain and surrounding structures were obtained without intravenous contrast. COMPARISON:  10/03/2022 MRI head, correlation is also made with 06/06/2023 CT head FINDINGS: Evaluation is somewhat limited by motion artifact. Brain: Increased T2 hyperintense signal and asymmetric volume loss in the right hippocampus (series 7, image 17 and series 8, image 14), which may be related to encephalomalacia in the right frontal lobe or could represent mesial temporal sclerosis. No heterotopia or evidence of cortical dysgenesis. No restricted diffusion to suggest acute or subacute infarct. No acute hemorrhage, mass, mass effect, or midline shift. No hydrocephalus or extra-axial collection. Partial empty sella. Craniocervical junction within normal limits. No hemosiderin deposition to suggest remote hemorrhage. Cerebral volume within normal limits for age. Scattered T2 hyperintense signal in the periventricular white matter, likely the sequela of mild a chronic small vessel ischemic disease. Vascular: Normal arterial flow voids. Skull and upper cervical spine: Normal marrow signal. Sinuses/Orbits: Mucous retention cyst in the left sphenoid sinus. Mild mucosal thickening in the ethmoid air cells. Status post bilateral lens replacements. No acute finding in the orbits.  Other: The mastoid air cells are well aerated. IMPRESSION: 1. No acute intracranial process. No evidence of acute or subacute infarct. 2. Increased T2 hyperintense signal and asymmetric volume loss in the right hippocampus, which may be related to encephalomalacia in the right frontal lobe or could represent mesial temporal sclerosis. Electronically Signed   By: Wiliam Ke M.D.   On: 06/07/2023 01:27   CT Head Wo Contrast Result Date: 06/06/2023 CLINICAL DATA:  Head trauma, minor (Age >= 65y); Facial trauma, blunt EXAM: CT HEAD WITHOUT CONTRAST CT CERVICAL SPINE WITHOUT CONTRAST TECHNIQUE: Multidetector CT imaging of the head and cervical spine was performed following the standard protocol without intravenous contrast. Multiplanar CT image reconstructions of the cervical spine were also generated. RADIATION DOSE REDUCTION: This exam was performed according to the departmental dose-optimization program which includes automated exposure control, adjustment of the mA and/or kV according to patient size and/or use of iterative reconstruction technique. COMPARISON:  CT head and C-spine 12/11/2022 FINDINGS: CT HEAD FINDINGS Brain: Chronic appearing bilateral subdural hygroma. Lower right anterior temporal encephalomalacia. No  evidence of large-territorial acute infarction. No parenchymal hemorrhage. No mass lesion. No extra-axial collection. No mass effect or midline shift. No hydrocephalus. Basilar cisterns are patent. Vascular: No hyperdense vessel. Atherosclerotic calcifications are present within the cavernous internal carotid arteries. Skull: No acute fracture or focal lesion. Likely prior right orbital fracture. Sinuses/Orbits: Left sphenoid sinus mucosal thickening. Otherwise paranasal sinuses and mastoid air cells are clear. The orbits are unremarkable. Other: None. CT CERVICAL SPINE FINDINGS Alignment: Grade 1 anterolisthesis of C3 on C4. Skull base and vertebrae: Multilevel moderate degenerative changes  of the spine with associated posterior disc osteophyte complex formation. Severe osseous neural foraminal stenosis of the bilateral C5-C6 osseous neural foramina. No severe osseous central canal stenosis. No acute fracture. No aggressive appearing focal osseous lesion or focal pathologic process. Soft tissues and spinal canal: No prevertebral fluid or swelling. No visible canal hematoma. Upper chest: Unremarkable. Other: Atherosclerotic plaque of the aorta and its main branches. Patient is edentulous. IMPRESSION: 1. No acute intracranial abnormality. 2. No acute displaced fracture or traumatic listhesis of the cervical spine. 3. Multilevel moderate degenerative changes of the spine. Severe osseous neural foraminal stenosis of the bilateral C5-C6 osseous neural foramina. 4.  Aortic Atherosclerosis (ICD10-I70.0). Electronically Signed   By: Tish Frederickson M.D.   On: 06/06/2023 19:54   CT Cervical Spine Wo Contrast Result Date: 06/06/2023 CLINICAL DATA:  Head trauma, minor (Age >= 65y); Facial trauma, blunt EXAM: CT HEAD WITHOUT CONTRAST CT CERVICAL SPINE WITHOUT CONTRAST TECHNIQUE: Multidetector CT imaging of the head and cervical spine was performed following the standard protocol without intravenous contrast. Multiplanar CT image reconstructions of the cervical spine were also generated. RADIATION DOSE REDUCTION: This exam was performed according to the departmental dose-optimization program which includes automated exposure control, adjustment of the mA and/or kV according to patient size and/or use of iterative reconstruction technique. COMPARISON:  CT head and C-spine 12/11/2022 FINDINGS: CT HEAD FINDINGS Brain: Chronic appearing bilateral subdural hygroma. Lower right anterior temporal encephalomalacia. No evidence of large-territorial acute infarction. No parenchymal hemorrhage. No mass lesion. No extra-axial collection. No mass effect or midline shift. No hydrocephalus. Basilar cisterns are patent.  Vascular: No hyperdense vessel. Atherosclerotic calcifications are present within the cavernous internal carotid arteries. Skull: No acute fracture or focal lesion. Likely prior right orbital fracture. Sinuses/Orbits: Left sphenoid sinus mucosal thickening. Otherwise paranasal sinuses and mastoid air cells are clear. The orbits are unremarkable. Other: None. CT CERVICAL SPINE FINDINGS Alignment: Grade 1 anterolisthesis of C3 on C4. Skull base and vertebrae: Multilevel moderate degenerative changes of the spine with associated posterior disc osteophyte complex formation. Severe osseous neural foraminal stenosis of the bilateral C5-C6 osseous neural foramina. No severe osseous central canal stenosis. No acute fracture. No aggressive appearing focal osseous lesion or focal pathologic process. Soft tissues and spinal canal: No prevertebral fluid or swelling. No visible canal hematoma. Upper chest: Unremarkable. Other: Atherosclerotic plaque of the aorta and its main branches. Patient is edentulous. IMPRESSION: 1. No acute intracranial abnormality. 2. No acute displaced fracture or traumatic listhesis of the cervical spine. 3. Multilevel moderate degenerative changes of the spine. Severe osseous neural foraminal stenosis of the bilateral C5-C6 osseous neural foramina. 4.  Aortic Atherosclerosis (ICD10-I70.0). Electronically Signed   By: Tish Frederickson M.D.   On: 06/06/2023 19:54    Disposition: Status is: Observation The patient remains OBS appropriate and will d/c before 2 midnights.  Planned Discharge Destination:  ALF     Time spent: 39 minutes  Author: Marcelino Duster, MD  06/08/2023 1:48 PM Secure chat 7am to 7pm For on call review [email or web address].

## 2023-06-08 NOTE — Progress Notes (Signed)
NEUROLOGY CONSULT FOLLOW UP NOTE   Date of service: June 08, 2023 Patient Name: Marcia Brown MRN:  [id number] DOB:  [birth year]  Brief HPI  Marcia Brown is a 67 y.o. female with a history of seizure disorder who presented on 06/06/2023 with breakthrough seizures. At baseline she is on five antiepileptics including Tegretol 150 twice daily, Lamotrigine 200-300, Keppra [phone], phenytoin 100 mg twice daily, Vimpat 150 twice daily.  She also takes tramadol 50 mg 3 times daily.  She has remained confused since admission, and I discussed with her care facility who reported that she has no visible signs of dementia normally.  There was one staff member who said she does get confused at times, but this is not the norm.    Interval Hx/subjective   She continues to be quite confused, confabulating. Vitals   Vitals:   06/08/23 0402 06/08/23 0712 06/08/23 0800 06/08/23 1109  BP: 130/75 (!) 116/59 (!) 125/112 104/88  Pulse: 75 76 71 71  Resp: 17 18 19 14   Temp: 100 F (37.8 C) 98.1 F (36.7 C)  98.2 F (36.8 C)  TempSrc: Oral Oral  Oral  SpO2: 97% 95% 97% 92%  Weight:      Height:         Body mass index is 29.29 kg/m.  Physical Exam   Constitutional: Appears well-developed and well-nourished.  Neurologic Examination   Gen: in bed, NAD  Neuro: MS: Awake, alert, knows that it is December but unable to give the year.  She does not know that she is in a hospital.  She is able to name several objects. CN: Visual fields are full, EOMI, very mild right facial weakness Motor: She has very mild right leg weakness, she has pain in her right upper extremity, making assessment difficult, but I suspect there is some weakness there as well. Sensory: Intact to light touch Labs and Diagnostic Imaging   CBC:  Recent Labs  Lab 06/07/23 0054  WBC 7.7  NEUTROABS 6.3  HGB 13.0  HCT 38.4  MCV 100.8*  PLT 248    Basic Metabolic Panel:  Lab Results  Component Value Date   NA 137  06/06/2023   K 3.9 06/06/2023   CO2 21 (L) 06/06/2023   GLUCOSE 89 06/06/2023   BUN 13 06/06/2023   CREATININE 0.77 06/06/2023   CALCIUM 9.2 06/06/2023   GFRNONAA >60 06/06/2023   GFRAA >60 11/27/2019   Lipid Panel: No results found for: "LDLCALC" HgbA1c:  Lab Results  Component Value Date   HGBA1C 5.5 06/26/2019   Urine Drug Screen:     Component Value Date/Time   LABOPIA NONE DETECTED 11/14/2016 1319   LABOPIA NONE DETECTED 11/05/2010 2103   COCAINSCRNUR NONE DETECTED 11/14/2016 1319   LABBENZ NONE DETECTED 11/14/2016 1319   LABBENZ POSITIVE (A) 11/05/2010 2103   AMPHETMU NONE DETECTED 11/14/2016 1319   AMPHETMU NONE DETECTED 11/05/2010 2103   THCU NONE DETECTED 11/14/2016 1319   THCU NONE DETECTED 11/05/2010 2103   LABBARB NONE DETECTED 11/14/2016 1319   LABBARB  11/05/2010 2103    NONE DETECTED        DRUG SCREEN FOR MEDICAL PURPOSES ONLY.  IF CONFIRMATION IS NEEDED FOR ANY PURPOSE, NOTIFY LAB WITHIN 5 DAYS.        LOWEST DETECTABLE LIMITS FOR URINE DRUG SCREEN Drug Class       Cutoff (ng/mL) Amphetamine      1000 Barbiturate      200 Benzodiazepine   200  Tricyclics       300 Opiates          300 Cocaine          300 THC              50     Imaging(Personally reviewed): MRI brain-negative EEG 12/27-negative  Impression   Marcia Brown is a 67 y.o. female with a history of seizure disorder who presents with breakthrough seizures and persistent postictal confusion.  She is already on six antiepileptic drugs (if you include gabapentin) and I did not favor increasing this regimen when she is also taking a medication known to significantly lower seizure threshold, tramadol.  I therefore discontinued tramadol.    My suspicion at the current time is that her encephalopathy represents a hospital related delirium in someone predisposed.  That being said, with confabulation, I do think that empiric thiamine repletion and checking this would be  prudent.  Recommendations  Continue home lacosamide 150 twice daily Continue home Keppra [phone] Continue home tegretol 150mg  BID Continue home phenytoin 100mg  BId Continue home lamotrigine 200-300 Conitnue home gabapentin 200mg  at bedtime Thiamine 500 mg 3 times daily after checking level B12, TSH Neurology to follow  ______________________________________________________________________   Thank you for the opportunity to take part in the care of this patient. If you have any further questions, please contact the neurology consultation team on call. Updated oncall schedule is listed on AMION.  Signed,  Ritta Slot Neurohospitalist

## 2023-06-09 DIAGNOSIS — R296 Repeated falls: Secondary | ICD-10-CM | POA: Diagnosis present

## 2023-06-09 DIAGNOSIS — R569 Unspecified convulsions: Secondary | ICD-10-CM | POA: Diagnosis present

## 2023-06-09 DIAGNOSIS — Z8673 Personal history of transient ischemic attack (TIA), and cerebral infarction without residual deficits: Secondary | ICD-10-CM | POA: Diagnosis not present

## 2023-06-09 DIAGNOSIS — G629 Polyneuropathy, unspecified: Secondary | ICD-10-CM | POA: Diagnosis present

## 2023-06-09 DIAGNOSIS — G4730 Sleep apnea, unspecified: Secondary | ICD-10-CM | POA: Diagnosis present

## 2023-06-09 DIAGNOSIS — Z791 Long term (current) use of non-steroidal anti-inflammatories (NSAID): Secondary | ICD-10-CM | POA: Diagnosis not present

## 2023-06-09 DIAGNOSIS — Z881 Allergy status to other antibiotic agents status: Secondary | ICD-10-CM | POA: Diagnosis not present

## 2023-06-09 DIAGNOSIS — Z79899 Other long term (current) drug therapy: Secondary | ICD-10-CM | POA: Diagnosis not present

## 2023-06-09 DIAGNOSIS — G40109 Localization-related (focal) (partial) symptomatic epilepsy and epileptic syndromes with simple partial seizures, not intractable, without status epilepticus: Secondary | ICD-10-CM | POA: Diagnosis present

## 2023-06-09 DIAGNOSIS — J4489 Other specified chronic obstructive pulmonary disease: Secondary | ICD-10-CM | POA: Diagnosis present

## 2023-06-09 DIAGNOSIS — G9341 Metabolic encephalopathy: Secondary | ICD-10-CM | POA: Diagnosis not present

## 2023-06-09 DIAGNOSIS — R4701 Aphasia: Secondary | ICD-10-CM | POA: Diagnosis present

## 2023-06-09 DIAGNOSIS — G40919 Epilepsy, unspecified, intractable, without status epilepticus: Secondary | ICD-10-CM | POA: Diagnosis not present

## 2023-06-09 DIAGNOSIS — J439 Emphysema, unspecified: Secondary | ICD-10-CM | POA: Diagnosis not present

## 2023-06-09 DIAGNOSIS — Z7951 Long term (current) use of inhaled steroids: Secondary | ICD-10-CM | POA: Diagnosis not present

## 2023-06-09 DIAGNOSIS — F17210 Nicotine dependence, cigarettes, uncomplicated: Secondary | ICD-10-CM | POA: Diagnosis present

## 2023-06-09 DIAGNOSIS — Z9181 History of falling: Secondary | ICD-10-CM | POA: Diagnosis not present

## 2023-06-09 DIAGNOSIS — F32A Depression, unspecified: Secondary | ICD-10-CM | POA: Diagnosis present

## 2023-06-09 DIAGNOSIS — Z993 Dependence on wheelchair: Secondary | ICD-10-CM | POA: Diagnosis not present

## 2023-06-09 DIAGNOSIS — R29810 Facial weakness: Secondary | ICD-10-CM | POA: Diagnosis present

## 2023-06-09 DIAGNOSIS — F419 Anxiety disorder, unspecified: Secondary | ICD-10-CM | POA: Diagnosis present

## 2023-06-09 DIAGNOSIS — F0394 Unspecified dementia, unspecified severity, with anxiety: Secondary | ICD-10-CM | POA: Diagnosis present

## 2023-06-09 DIAGNOSIS — F05 Delirium due to known physiological condition: Secondary | ICD-10-CM | POA: Diagnosis not present

## 2023-06-09 DIAGNOSIS — Z886 Allergy status to analgesic agent status: Secondary | ICD-10-CM | POA: Diagnosis not present

## 2023-06-09 DIAGNOSIS — E7800 Pure hypercholesterolemia, unspecified: Secondary | ICD-10-CM | POA: Diagnosis present

## 2023-06-09 MED ORDER — VITAMIN B-12 1000 MCG PO TABS
1000.0000 ug | ORAL_TABLET | Freq: Every day | ORAL | Status: DC
  Administered 2023-06-10: 1000 ug via ORAL
  Filled 2023-06-09: qty 1

## 2023-06-09 MED ORDER — CYANOCOBALAMIN 1000 MCG/ML IJ SOLN
1000.0000 ug | Freq: Once | INTRAMUSCULAR | Status: AC
  Administered 2023-06-09: 1000 ug via INTRAMUSCULAR
  Filled 2023-06-09: qty 1

## 2023-06-09 NOTE — Progress Notes (Signed)
Speech Language Pathology Treatment: Dysphagia  Patient Details Name: Marcia Brown MRN: [id number] DOB: 06-24-1955 Today's Date: 06/09/2023 Time: 3016-0109 SLP Time Calculation (min) (ACUTE ONLY): 12 min  Assessment / Plan / Recommendation Clinical Impression  Pt seen for diet tolerance and PO trials. Pt alert, pleasant, and cooperative. Distracted by environmental stimuli. Aphasia evident. Pt presents with s/sx mild/moderate oral dysphagia c/b prolonged/inefficient mastication of solids likely due to dental status and exacerbated by mental status. Recommend diet upgrade to Dysphagia 2 with Thin Liquids with safe swallowing strategies/aspiration precautions as outlined below. SLP to f/u per POC for diet tolerance and trials of upgraded consistencies, as appropriate.   HPI HPI: Per H&P, "Marcia Brown is a 67 y.o. female with medical history significant for Prior CVA, COPD, temporal lobe epilepsy and dizziness on multiple AEDs,, dementia, anxiety who was sent from her facility with a breakthrough seizure.  She apparently has had several seizures over the past few days.  Patient is awake and alert but is oriented to person only and appears confused and unable to contribute to history."      SLP Plan  Continue with current plan of care      Recommendations for follow up therapy are one component of a multi-disciplinary discharge planning process, led by the attending physician.  Recommendations may be updated based on patient status, additional functional criteria and insurance authorization.    Recommendations  Diet recommendations: Dysphagia 2 (fine chop);Thin liquid Liquids provided via: Straw Medication Administration: Whole meds with liquid (as tolerated) Supervision: Patient able to self feed Compensations: Slow rate;Small sips/bites;Minimize environmental distractions Postural Changes and/or Swallow Maneuvers: Seated upright 90 degrees;Upright 30-60 min after meal                   Oral care BID;Staff/trained caregiver to provide oral care    (defer to OT/PT) Dysphagia, oral phase (R13.11)     Continue with current plan of care    Marcia Brown, M.S., CCC-SLP Speech-Language Pathologist [facility] [phone] (ASCOM)  Marcia Brown  06/09/2023, 9:00 AM

## 2023-06-09 NOTE — TOC Initial Note (Signed)
Transition of Care [facility]) - Initial/Assessment Note    Patient Details  Name: Marcia Brown MRN: [id number] Date of Birth: 11-10-1955  [facility]) CM/SW Contact:    Rodney Langton, RN Phone Number: 06/09/2023, 1:13 PM  Clinical Narrative:                  Patient is medically ready for discharge, admitted from [facility] for ALF.  Called [facility], unable to confirm that patient can go back tomorrow.  Facility advised to wait until tomorrow morning when director returned to confirm discharge.   Expected Discharge Plan: Assisted Living Barriers to Discharge: Continued Medical Work up   Patient Goals and CMS Choice Patient states their goals for this hospitalization and ongoing recovery are:: Back to ALF   Choice offered to / list presented to : Patient      Expected Discharge Plan and Services       Living arrangements for the past 2 months: Assisted Living Facility                                      Prior Living Arrangements/Services Living arrangements for the past 2 months: Assisted Living Facility Lives with:: Facility Resident                   Activities of Daily Living      Permission Sought/Granted                  Emotional Assessment              Admission diagnosis:  Seizure [facility]) [R56.9] Word finding difficulty [R47.89] Breakthrough seizure (HCC) [G40.919] Seizures (HCC) [R56.9] Patient Active Problem List   Diagnosis Date Noted   Breakthrough seizure (HCC) 06/07/2023   Fall 11/13/2022   Closed fracture of right distal radius 11/13/2022   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   Right wrist fracture, closed, initial encounter 11/12/2022   COPD (chronic obstructive pulmonary disease) (HCC) 11/12/2022   Anxiety 11/12/2022   Sleep apnea 11/12/2022   Dizzy spells 11/12/2022   Wrist fracture, closed, right, initial encounter 11/12/2022   Seizures (HCC) 08/04/2019   Acute lower UTI 06/26/2019    Hypokalemia 06/26/2019   AMS (altered mental status) 06/26/2019   Tobacco abuse 12/27/2015   Repeated falls 05/24/2015   Seizure (HCC) 02/03/2015   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

## 2023-06-09 NOTE — Progress Notes (Signed)
Progress Note   Patient: Marcia Brown ZOX:[id number] DOB: 1955-10-24 DOA: 06/06/2023     0 DOS: the patient was seen and examined on 06/09/2023   Brief hospital course: Marcia Brown is a 67 y.o. female with medical history significant for Prior CVA, COPD, temporal lobe epilepsy and dizziness on multiple AEDs,  anxiety who was sent from her facility with a breakthrough seizure.  She apparently has had several seizures over the past few days.    Patient is admitted to the hospitalist service for breakthrough seizures, with neurology consultation.  Patient is started on her home antiseizure medications.  Assessment and Plan: * Breakthrough seizure (HCC) Temporal lobe epilepsy No more seizure activity while in patient. Ativan as needed seizures.  Neurology follow-up appreciated, EEG no epileptiform discharges noted. Continue lacosamide 150 mg, IV Keppra changed to oral 500 AM, 1000 p.m., Tegretol 150 twice daily, phenytoin 100 twice daily, lamotrigine 200 a.m., 300 p.m., gabapentin 200 mg at bedtime. Discontinue tramadol. Continue seizure precautions. Continue fall, aspiration precautions. Nursing supportive care.  Acute encephalopathy Possibly hospital related delirium. No prior history of dementia as per facility. Neurology advised to dc high dose thiamine. Low B12, B12 supplementation ordered  COPD- Stable. Continue current home inhalers.  Anxiety: On Seroquel therapy.  History of CVA: Continue statin therapy.    DVT prophylaxis Lovenox   Code Status: Full Code  Subjective: Patient is seen and examined today morning.  She is lying comfortably.  Able to tell her name. Eating poor, has low appetite. Did not get out of bed.  Physical Exam: Vitals:   06/08/23 2354 06/09/23 0455 06/09/23 0811 06/09/23 1206  BP: 96/62 111/65 103/60 (!) 109/55  Pulse: 65 63 69 (!) 59  Resp: 16 20 16 16   Temp: 99.6 F (37.6 C) 99 F (37.2 C) 97.6 F (36.4 C) 98 F (36.7 C)  TempSrc:       SpO2: 100% 95% 99% 100%  Weight:      Height:        General - Elderly occasion female, no apparent distress HEENT - PERRLA, EOMI, atraumatic head, non tender sinuses. Lung - Clear, no rales, rhonchi, wheezes. Heart - S1, S2 heard, no murmurs, rubs, trace pedal edema. Abdomen - Soft, non tender, no distention, bowel sounds good Neuro - Alert, awake and oriented x 1, non focal exam. Skin - Warm and dry.  Data Reviewed:      Latest Ref Rng & Units 06/07/2023   12:54 AM 12/11/2022    4:05 AM 11/14/2022   10:59 AM  CBC  WBC 4.0 - 10.5 K/uL 7.7  3.7  5.1   Hemoglobin 12.0 - 15.0 g/dL 81.1  91.4  9.5   Hematocrit 36.0 - 46.0 % 38.4  36.9  28.7   Platelets 150 - 400 K/uL 248  130  156       Latest Ref Rng & Units 06/06/2023    7:25 PM 12/11/2022    5:46 AM 11/13/2022    4:41 AM  BMP  Glucose 70 - 99 mg/dL 89  87  782   BUN 8 - 23 mg/dL 13  14  15    Creatinine 0.44 - 1.00 mg/dL 9.56  2.13  0.86   Sodium 135 - 145 mmol/L 137  136  136   Potassium 3.5 - 5.1 mmol/L 3.9  3.3  4.0   Chloride 98 - 111 mmol/L 103  106  105   CO2 22 - 32 mmol/L 21  23  22   Calcium 8.9 - 10.3 mg/dL 9.2  8.6  8.3    EEG adult Result Date: 06/07/2023 Rejeana Brock, MD     06/07/2023  7:26 PM History: 67 year old female with a history of seizures presenting with breakthrough seizures Sedation: None Patient State: Awake and drowsy Technique: This EEG was acquired with electrodes placed according to the International 10-20 electrode system (including Fp1, Fp2, F3, F4, C3, C4, P3, P4, O1, O2, T3, T4, T5, T6, A1, A2, Fz, Cz, Pz). The following electrodes were missing or displaced: none. Background: The background consists of intermixed alpha and beta activities. There is a well defined posterior dominant rhythm of 10 Hz that attenuates with eye opening.  There is generalized irregular delta and theta activity intruding in the background throughout the study. Photic stimulation: Physiologic driving is not  performed EEG Abnormalities: Generalized irregular slow activity Clinical Interpretation: This EEG is consistent with a generalized nonspecific cerebral dysfunction (encephalopathy). There was no seizure or seizure predisposition recorded on this study. Please note that lack of epileptiform activity on EEG does not preclude the possibility of epilepsy. Ritta Slot, MD [facility] [phone] If 7pm- 7am, please page neurology on call as listed in AMION.   Disposition: Status is: Observation The patient will require care spanning > 2 midnights and should be moved to inpatient because: seizures, delirium neuro work up.  Planned Discharge Destination:  ALF     Time spent: 39 minutes  Author: Marcelino Duster, MD 06/09/2023 12:23 PM Secure chat 7am to 7pm For on call review [email or web address].

## 2023-06-09 NOTE — Plan of Care (Signed)

## 2023-06-09 NOTE — Progress Notes (Signed)
NEUROLOGY CONSULT FOLLOW UP NOTE   Date of service: June 09, 2023 Patient Name: Marcia Brown MRN:  [id number] DOB:  [birth year]  Brief HPI  LISSETTE SITTNER is a 67 y.o. female with a history of seizure disorder who presented on 06/06/2023 with breakthrough seizures. At baseline she is on five antiepileptics including Tegretol 150 twice daily, Lamotrigine 200-300, Keppra [phone], phenytoin 100 mg twice daily, Vimpat 150 twice daily.  She also takes tramadol 50 mg 3 times daily.    Interval Hx/subjective   Her confusion continues to improve.  She continues to have an expressive aphasia but states that this is baseline for her.  Vitals   Vitals:   06/08/23 2115 06/08/23 2354 06/09/23 0455 06/09/23 0811  BP: 112/68 96/62 111/65 103/60  Pulse: 62 65 63 69  Resp: 17 16 20 16   Temp: 99.2 F (37.3 C) 99.6 F (37.6 C) 99 F (37.2 C) 97.6 F (36.4 C)  TempSrc:      SpO2: 95% 100% 95% 99%  Weight:      Height:         Body mass index is 29.29 kg/m.  Physical Exam   Constitutional: Appears well-developed and well-nourished.  Neurologic Examination   Gen: in bed, NAD  Neuro: MS: Awake, alert, knows that it is December but unable to give the year.  She is able to tell me that she is at [facility].  She is able to name several objects, but has significant expressive aphasia. CN: Visual fields are full, EOMI, very mild right facial weakness Motor: She has relatively symmetric leg strength, her right upper extremity is limited significantly by pain. Sensory: Intact to light touch Labs and Diagnostic Imaging   CBC:  Recent Labs  Lab 06/07/23 0054  WBC 7.7  NEUTROABS 6.3  HGB 13.0  HCT 38.4  MCV 100.8*  PLT 248    Basic Metabolic Panel:  Lab Results  Component Value Date   NA 137 06/06/2023   K 3.9 06/06/2023   CO2 21 (L) 06/06/2023   GLUCOSE 89 06/06/2023   BUN 13 06/06/2023   CREATININE 0.77 06/06/2023   CALCIUM 9.2 06/06/2023   GFRNONAA >60 06/06/2023    GFRAA >60 11/27/2019   Lipid Panel: No results found for: "LDLCALC" HgbA1c:  Lab Results  Component Value Date   HGBA1C 5.5 06/26/2019   Urine Drug Screen:     Component Value Date/Time   LABOPIA NONE DETECTED 11/14/2016 1319   LABOPIA NONE DETECTED 11/05/2010 2103   COCAINSCRNUR NONE DETECTED 11/14/2016 1319   LABBENZ NONE DETECTED 11/14/2016 1319   LABBENZ POSITIVE (A) 11/05/2010 2103   AMPHETMU NONE DETECTED 11/14/2016 1319   AMPHETMU NONE DETECTED 11/05/2010 2103   THCU NONE DETECTED 11/14/2016 1319   THCU NONE DETECTED 11/05/2010 2103   LABBARB NONE DETECTED 11/14/2016 1319   LABBARB  11/05/2010 2103    NONE DETECTED        DRUG SCREEN FOR MEDICAL PURPOSES ONLY.  IF CONFIRMATION IS NEEDED FOR ANY PURPOSE, NOTIFY LAB WITHIN 5 DAYS.        LOWEST DETECTABLE LIMITS FOR URINE DRUG SCREEN Drug Class       Cutoff (ng/mL) Amphetamine      1000 Barbiturate      200 Benzodiazepine   200 Tricyclics       300 Opiates          300 Cocaine          300 THC  50     Imaging(Personally reviewed): MRI brain-negative EEG 12/27-negative  Impression   AYUMI DIP is a 68 y.o. female with a history of seizure disorder who presents with breakthrough seizures and persistent postictal confusion.  She is already on six antiepileptic drugs (if you include gabapentin) and I did not favor increasing this regimen when she is also taking a medication known to significantly lower seizure threshold, tramadol.  I therefore discontinued tramadol.   Her confusion continues to improve, my suspicion is that she is currently near her baseline.  She states that she always has some difficulty with word finding and I suspect that this is close to her baseline.  Her B12 is borderline, I think would be reasonable to send an MMA and give a B12 shot pending MMA result.  This could be followed up as an outpatient.  I have relatively low suspicion for thiamine deficiency I do not think we need  to continue high-dose thiamine at this time.  Recommendations  Continue home lacosamide 150 twice daily Continue home Keppra [phone] Continue home tegretol 150mg  BID Continue home phenytoin 100mg  BId Continue home lamotrigine 200-300 Conitnue home gabapentin 200mg  at bedtime Do not restart tramadol on discharge B12 injection Neurology to sign off, please call if further questions or concerns.  ______________________________________________________________________   Thank you for the opportunity to take part in the care of this patient. If you have any further questions, please contact the neurology consultation team on call. Updated oncall schedule is listed on AMION.  Signed,  Ritta Slot Neurohospitalist

## 2023-06-10 DIAGNOSIS — Z8673 Personal history of transient ischemic attack (TIA), and cerebral infarction without residual deficits: Secondary | ICD-10-CM | POA: Diagnosis not present

## 2023-06-10 DIAGNOSIS — G40109 Localization-related (focal) (partial) symptomatic epilepsy and epileptic syndromes with simple partial seizures, not intractable, without status epilepticus: Secondary | ICD-10-CM | POA: Diagnosis not present

## 2023-06-10 MED ORDER — LEVETIRACETAM 500 MG PO TABS: 500.0000 mg | ORAL_TABLET | Freq: Two times a day (BID) | ORAL | 0 refills | Status: DC

## 2023-06-10 MED ORDER — CYANOCOBALAMIN 1000 MCG PO TABS: 1000.0000 ug | ORAL_TABLET | Freq: Every day | ORAL | 2 refills | Status: AC

## 2023-06-10 MED ORDER — LAMOTRIGINE 100 MG PO TABS: 200.0000 mg | ORAL_TABLET | Freq: Every day | ORAL | 0 refills | Status: DC

## 2023-06-10 MED ORDER — CLOPIDOGREL BISULFATE 75 MG PO TABS
75.0000 mg | ORAL_TABLET | Freq: Every day | ORAL | Status: DC
  Administered 2023-06-10: 75 mg via ORAL
  Filled 2023-06-10: qty 1

## 2023-06-10 MED ORDER — LAMOTRIGINE 100 MG PO TABS: 300.0000 mg | ORAL_TABLET | Freq: Every day | ORAL | 0 refills | Status: DC

## 2023-06-10 NOTE — TOC Transition Note (Signed)
Transition of Care [facility]) - Discharge Note   Patient Details  Name: Marcia Brown MRN: [id number] Date of Birth: [birth year]  Transition of Care [facility]) CM/SW Contact:  Garret Reddish, RN Phone Number: 06/10/2023, 4:33 PM   Clinical Narrative:    Chart reviewed.  Noted that patient has orders for discharge for today.  I have spoken with Amalia Hailey from [facility].  Amalia Hailey reports that patient is able to return back to the facility today.  I have faxed Dustin patient's Fl 2 and Discharge Summary.  Amalia Hailey informs me that patient was active with [facility] for home health PT/OT.  I have informed Velna Hatchet with [facility] that patient will be a discharge for today.  I have made Velna Hatchet aware that patient will require PT/OT at the facility.    I have left a message for Chauncey Cruel, patient emergency contact that patient will be discharging back to the [facility] today.    I have arranged for [facility] to transport patient to the facility today.    I have informed staff nurse to call report to [phone].       Final next level of care: Assisted Living Barriers to Discharge: No Barriers Identified   Patient Goals and CMS Choice Patient states their goals for this hospitalization and ongoing recovery are:: Back to ALF CMS Medicare.gov Compare Post Acute Care list provided to:: Patient Choice offered to / list presented to : Patient      Discharge Placement              Patient chooses bed at:  ([facility] [place]) Patient to be transferred to facility by: [facility] Name of family member notified: Chauncey Cruel, LVM Patient and family notified of of transfer: 06/10/23  Discharge Plan and Services Additional resources added to the After Visit Summary for                            [facility] Arranged: PT, OT HH Agency: [facility]     Representative spoke with at [facility] Agency: Velna Hatchet  Social Drivers of Health (SDOH) Interventions SDOH  Screenings   Food Insecurity: No Food Insecurity (11/13/2022)  Housing: Low Risk  (11/13/2022)  Transportation Needs: No Transportation Needs (11/13/2022)  Utilities: Not At Risk (11/13/2022)  Tobacco Use: High Risk (06/08/2023)     Readmission Risk Interventions     No data to display

## 2023-06-10 NOTE — Progress Notes (Signed)
This RN called report to Dellia Cloud, Charity fundraiser at [facility]. All questions and concerns answered at this time. This RN relayed that she is currently second on the list for EMS transport.

## 2023-06-10 NOTE — Progress Notes (Signed)
Physical Therapy Evaluation Patient Details Name: [name] MRN: [id number] DOB: April 08, 1956 Today's Date: 06/10/2023  History of Present Illness  Marcia Brown is a 67 y.o. female with medical history significant for Prior CVA, COPD, temporal lobe epilepsy and dizziness on multiple AEDs, dementia, anxiety who was sent from her facility with a breakthrough seizure. She apparently has had several seizures over the past few days.   Clinical Impression  Orders Received. Chart Reviewed. Patient seated in recliner upon arrival, agreeable to PT evaluation this date. Prior to admission, patient lives at [facility] [place], patient difficulty with history. Reports intermittent use of RW, but significant fall history. On evaluation, patient require close supervision to CGA with mobility attempts including STS transfer and ambulation. Trialed without AD with increased unsteadiness, PT highly encouraged and educated on safety with AD and importance to reduce fall risk. Pt verbalize understanding. Patient seems close to functional baseline and all PT needs can be meet at next venue of care. Anticipate the need for follow up PT services upon acute hospital discharge. PT will sign of as no acute skilled PT needs at this time.        If plan is discharge home, recommend the following: A little help with walking and/or transfers;Assist for transportation;Supervision due to cognitive status   Can travel by private vehicle        Equipment Recommendations Other (comment) (TBD at next venue)  Recommendations for Other Services       Functional Status Assessment Patient has had a recent decline in their functional status and demonstrates the ability to make significant improvements in function in a reasonable and predictable amount of time.     Precautions / Restrictions Precautions Precautions: Fall Restrictions Weight Bearing Restrictions Per Provider Order: No      Mobility  Bed Mobility                General bed mobility comments: not assesssed; recieved in recliner and left in recliner at end of session.    Transfers Overall transfer level: Needs assistance Equipment used: Rolling walker (2 wheels) Transfers: Sit to/from Stand Sit to Stand: Supervision           General transfer comment: supervision for safety, cues for hand placement and safe use of AD.    Ambulation/Gait Ambulation/Gait assistance: Supervision, Contact guard assist Gait Distance (Feet): 200 Feet Assistive device: Rolling walker (2 wheels), None   Gait velocity: Decreased     General Gait Details: Pt able to ambulate x 100 ft with RW supervision no evidence of imbalance with UE support. Removed AD, and trialed ambulation w/o x 100 ft, CGA required due to imbalance. Freq cues for navigation. PT educating on importnance of use of RW to promote improved balance and reduced risks for falls.  Stairs            Wheelchair Mobility     Tilt Bed    Modified Rankin (Stroke Patients Only)       Balance Overall balance assessment: Needs assistance Sitting-balance support: Feet supported, No upper extremity supported Sitting balance-Leahy Scale: Normal     Standing balance support: No upper extremity supported, During functional activity Standing balance-Leahy Scale: Fair Standing balance comment: unsteadiness without UE support, improved stability with RW noted on evaluation                             Pertinent Vitals/Pain Pain Assessment Pain Assessment:  No/denies pain    Home Living Family/patient expects to be discharged to:: Assisted living ([facility])                 Home Equipment: Other (comment) (unsure; patient unable to remember equipment) Additional Comments: lives at the [facility] [address], reports intermittent assistance as needed.    Prior Function Prior Level of Function : Independent/Modified Independent;History of Falls (last six  months)             Mobility Comments: reports intermittent use of RW at all times while at ALF, often tries to ambulate without AD. Multiple falls, has lost count over the past 6 months per patient reports. ADLs Comments: per patient reports IND with ADLs; but can have assistance if needed     Extremity/Trunk Assessment   Upper Extremity Assessment Upper Extremity Assessment: Overall WFL for tasks assessed    Lower Extremity Assessment Lower Extremity Assessment: Overall WFL for tasks assessed       Communication   Communication Communication: No apparent difficulties  Cognition Arousal: Alert Behavior During Therapy: WFL for tasks assessed/performed Overall Cognitive Status: History of cognitive impairments - at baseline                                          General Comments      Exercises     Assessment/Plan    PT Assessment All further PT needs can be met in the next venue of care  PT Problem List Decreased activity tolerance;Decreased balance;Decreased mobility;Decreased safety awareness;Decreased knowledge of use of DME       PT Treatment Interventions      PT Goals (Current goals can be found in the Care Plan section)  Acute Rehab PT Goals Patient Stated Goal: get back to my home PT Goal Formulation: With patient Time For Goal Achievement: 06/24/23 Potential to Achieve Goals: Good    Frequency       Co-evaluation               AM-PAC PT "6 Clicks" Mobility  Outcome Measure Help needed turning from your back to your side while in a flat bed without using bedrails?: None Help needed moving from lying on your back to sitting on the side of a flat bed without using bedrails?: None Help needed moving to and from a bed to a chair (including a wheelchair)?: None Help needed standing up from a chair using your arms (e.g., wheelchair or bedside chair)?: None Help needed to walk in hospital room?: A Little Help needed climbing  3-5 steps with a railing? : A Little 6 Click Score: 22    End of Session Equipment Utilized During Treatment: Gait belt Activity Tolerance: Patient tolerated treatment well Patient left: in chair;with chair alarm set;with call bell/phone within reach Nurse Communication: Mobility status PT Visit Diagnosis: Unsteadiness on feet (R26.81);Other abnormalities of gait and mobility (R26.89);History of falling (Z91.81)    Time: 1254-1310 PT Time Calculation (min) (ACUTE ONLY): 16 min   Charges:   PT Evaluation $PT Eval Low Complexity: 1 Low   PT General Charges $$ ACUTE PT VISIT: 1 Visit        Creed Copper Fairly, PT, DPT 06/10/23 1:34 PM

## 2023-06-10 NOTE — Progress Notes (Signed)
Speech Language Pathology Treatment: Dysphagia  Patient Details Name: Marcia Brown MRN: [id number] DOB: [birth year] Today's Date: 06/10/2023 Time: 0815-0827 SLP Time Calculation (min) (ACUTE ONLY): 12 min  Assessment / Plan / Recommendation Clinical Impression  Pt seen for diet tolerance and PO trials. Pt alert, pleasant, and cooperative. Distracted by environmental stimuli. Aphasia evident. Pt presents with s/sx mild/moderate oral dysphagia c/b prolonged/inefficient mastication of solids likely due to dental status and exacerbated by mental status. Reviewed recent diet upgrade, swallowing strategies, and diet options with pt. Pt stated she would like to continue Dysphagia 2 Diet for ease of chewing. Recommend continuation of a Dysphagia 2 with Thin Liquids with safe swallowing strategies/aspiration precautions as outlined below. Suspect pt at or near swallow baseline given dentition. SLP to sign off as pt has no acute SLP needs at this time.    HPI HPI: Per H&P, "Marcia Brown is a 67 y.o. female with medical history significant for Prior CVA, COPD, temporal lobe epilepsy and dizziness on multiple AEDs,, dementia, anxiety who was sent from her facility with a breakthrough seizure.  She apparently has had several seizures over the past few days.  Patient is awake and alert but is oriented to person only and appears confused and unable to contribute to history."      SLP Plan  All goals met      Recommendations for follow up therapy are one component of a multi-disciplinary discharge planning process, led by the attending physician.  Recommendations may be updated based on patient status, additional functional criteria and insurance authorization.    Recommendations  Diet recommendations: Dysphagia 2 (fine chop);Thin liquid Liquids provided via: Teaspoon;Cup;Straw Medication Administration: Whole meds with liquid (as tolerated) Supervision: Patient able to self feed Compensations: Slow  rate;Small sips/bites;Minimize environmental distractions Postural Changes and/or Swallow Maneuvers: Seated upright 90 degrees;Upright 30-60 min after meal                  Oral care BID;Staff/trained caregiver to provide oral care    (defer to OT/PT) Dysphagia, oral phase (R13.11)     All goals met     Woodroe Chen  06/10/2023, 8:44 AM

## 2023-06-10 NOTE — Progress Notes (Signed)
Approximately 1800-- Pt discharged to SNF. At time of discharge, pt AO x 3 (baseline) and VSS. All PIVs removed with sites WDL. Report called to the [facility] [place] by this RN with all questions answered at this time. This RN attempted to call pt's niece, Chauncey Cruel, to notify her pt was discharged back to SNF. No answer, answering machine received. Pt transported via [facility].

## 2023-06-10 NOTE — Discharge Summary (Signed)
Physician Discharge Summary   Patient: Marcia Brown MRN: [id number] DOB: [birth year]  Admit date:     06/06/2023  Discharge date: 06/10/23  Discharge Physician: Marcelino Duster   PCP: [facility]   Recommendations at discharge:    PCP follow up in 1 week.  Discharge Diagnoses: Principal Problem:   Breakthrough seizure (HCC) Active Problems:   Temporal lobe epilepsy (HCC)   Seizures (HCC)  Resolved Problems:   * No resolved hospital problems. *  Hospital Course: Marcia Brown is a 66 y.o. female with medical history significant for Prior CVA, COPD, temporal lobe epilepsy and dizziness on multiple AEDs,, dementia, anxiety who was sent from her facility with a breakthrough seizure.  She apparently has had several seizures over the past few days.  Patient is awake and alert but is oriented to person only and appears confused and unable to contribute to history.   Patient is admitted to the hospitalist service for breakthrough seizures, with neurology consultation. Patient is started on her home antiseizure medications.  Tramadol stopped as it lowers seizure threshold per neurology recs. CT head, MRI brain, EEG unremarkable. She is continued on lacosamide 150 mg bid, IV Keppra changed to oral 500 AM, 1000 p.m., Tegretol 150 twice daily, phenytoin 100 twice daily, lamotrigine 200 a.m., 300 p.m., gabapentin 200 mg at bedtime.  Patient is confused at times, started on high-dose thiamine.  B12 level border line low, started supplementation. Seen by SLP advised dysphagia 2 diet with thin liquids. She did not have any more episodes of seizures and she is hemodynamically stable to be discharged back to facility.  Patient will need to follow-up with PCP, neurology upon discharge as instructed.      Consultants: Neurology Procedures performed: none  Disposition: Nursing home Diet recommendation:  Discharge Diet Orders (From admission, onward)     Start     Ordered   06/10/23  0000  Diet - low sodium heart healthy       Comments: Dysphagia 2, thin liquids Assist with feeding.   06/10/23 1310           Dysphagia type 2 thin Liquid DISCHARGE MEDICATION: Allergies as of 06/10/2023       Reactions   Ciprofloxacin Other (See Comments)   Other Reaction: Lowered seizure threshold   Aspirin Other (See Comments)   Reaction: Unknown        Medication List     STOP taking these medications    traMADol 50 MG tablet Commonly known as: ULTRAM       TAKE these medications    acetaminophen 325 MG tablet Commonly known as: TYLENOL Take 650 mg by mouth in the morning, at noon, and at bedtime. Take 650 mg by mouth 3 (three) times daily GIVEN WITH TRAMADOL 0800/1400/2000   albuterol 108 (90 Base) MCG/ACT inhaler Commonly known as: VENTOLIN HFA Inhale 2 puffs into the lungs every 4 (four) hours as needed for wheezing or shortness of breath.   alum & mag hydroxide-simeth 200-200-20 MG/5ML suspension Commonly known as: MAALOX/MYLANTA Take 30 mLs by mouth every 6 (six) hours as needed for indigestion or heartburn.   azelastine 0.05 % ophthalmic solution Commonly known as: OPTIVAR Place 1 drop into both eyes 2 (two) times daily.   budesonide-formoterol 160-4.5 MCG/ACT inhaler Commonly known as: SYMBICORT Inhale 2 puffs into the lungs 2 (two) times daily.   carbamazepine 100 MG chewable tablet Commonly known as: TEGRETOL Chew 150 mg by mouth in the morning and at  bedtime.   celecoxib 100 MG capsule Commonly known as: CELEBREX Take 100 mg by mouth 2 (two) times daily.   cholecalciferol 10 MCG (400 UNIT) Tabs tablet Commonly known as: VITAMIN D3 Take 400 Units by mouth daily.   clopidogrel 75 MG tablet Commonly known as: PLAVIX Take 75 mg by mouth daily.   cyanocobalamin 1000 MCG tablet Take 1 tablet (1,000 mcg total) by mouth daily. Start taking on: June 11, 2023   diphenhydrAMINE 25 MG tablet Commonly known as: BENADRYL Take 25 mg by  mouth every 6 (six) hours as needed for allergies (allergic reaction).   docusate sodium 50 MG capsule Commonly known as: COLACE Take 50 mg by mouth daily.   DULoxetine HCl 40 MG Cpep Take 40 mg by mouth 2 (two) times daily.   gabapentin 100 MG capsule Commonly known as: NEURONTIN Take 200 mg by mouth at bedtime.   guaiFENesin 100 MG/5ML liquid Commonly known as: ROBITUSSIN Take 15 mLs by mouth every 6 (six) hours as needed for cough.   hydrocortisone cream 1 % Apply 1 Application topically daily as needed (skin rash/ irritation).   lamoTRIgine 100 MG tablet Commonly known as: LaMICtal Take 2 tablets (200 mg total) by mouth daily. What changed:  how much to take when to take this   lamoTRIgine 100 MG tablet Commonly known as: LAMICTAL Take 3 tablets (300 mg total) by mouth at bedtime. What changed:  medication strength how much to take when to take this   levETIRAcetam 500 MG tablet Commonly known as: KEPPRA Take 1-2 tablets (500-1,000 mg total) by mouth 2 (two) times daily. Take one tablet (500) by mouth daily in the morning and two tablets (1000 mg) in the evening   loperamide 2 MG capsule Commonly known as: IMODIUM Take 4 mg by mouth as needed for diarrhea or loose stools.   loratadine 10 MG tablet Commonly known as: CLARITIN Take 10 mg by mouth daily.   losartan 50 MG tablet Commonly known as: COZAAR Take 50 mg by mouth daily.   magnesium hydroxide 400 MG/5ML suspension Commonly known as: MILK OF MAGNESIA Take 30 mLs by mouth daily as needed for mild constipation or moderate constipation.   multivitamin with minerals Tabs tablet Take 1 tablet by mouth daily.   pantoprazole 20 MG tablet Commonly known as: PROTONIX Take 20 mg by mouth at bedtime.   phenytoin 100 MG ER capsule Commonly known as: DILANTIN Take 100 mg by mouth 2 (two) times daily.   QUEtiapine 25 MG tablet Commonly known as: SEROQUEL Take 25 mg by mouth at bedtime.   rosuvastatin 5  MG tablet Commonly known as: CRESTOR Take 5 mg by mouth daily at 8 pm.   sodium chloride 0.65 % nasal spray Commonly known as: OCEAN Place 2 sprays into the nose 2 (two) times daily as needed (dry nose/epitaxis). 2 Spray(s) Both Nares Twice Daily PRN for dry nose/epistaxis   sucralfate 1 g tablet Commonly known as: CARAFATE Take 1 g by mouth 2 (two) times daily. Take on empty stomach   Vimpat 150 MG Tabs Generic drug: Lacosamide Take 1 tablet by mouth 2 (two) times daily.        Follow-up Information     [facility] Follow up.   Specialty: Geriatric Medicine Why: Hospital follow up Contact information: [address] [place] [address] [phone]                Discharge Exam: Ceasar Mons Weights  06/06/23 1918 06/10/23 0833  Weight: 68 kg 66.8 kg   General - Elderly occasion female, no apparent distress HEENT - PERRLA, EOMI, atraumatic head, non tender sinuses. Lung - Clear, no rales, rhonchi, wheezes. Heart - S1, S2 heard, no murmurs, rubs, trace pedal edema. Abdomen - Soft, non tender, no distention, bowel sounds good Neuro - Alert, awake and oriented x 2, non focal exam. Skin - Warm and dry.  Condition at discharge: stable  The results of significant diagnostics from this hospitalization (including imaging, microbiology, ancillary and laboratory) are listed below for reference.   Imaging Studies: EEG adult Result Date: 06/07/2023 Rejeana Brock, MD     06/07/2023  7:26 PM History: 67 year old female with a history of seizures presenting with breakthrough seizures Sedation: None Patient State: Awake and drowsy Technique: This EEG was acquired with electrodes placed according to the International 10-20 electrode system (including Fp1, Fp2, F3, F4, C3, C4, P3, P4, O1, O2, T3, T4, T5, T6, A1, A2, Fz, Cz, Pz). The following electrodes were missing or displaced: none. Background: The background consists of intermixed alpha  and beta activities. There is a well defined posterior dominant rhythm of 10 Hz that attenuates with eye opening.  There is generalized irregular delta and theta activity intruding in the background throughout the study. Photic stimulation: Physiologic driving is not performed EEG Abnormalities: Generalized irregular slow activity Clinical Interpretation: This EEG is consistent with a generalized nonspecific cerebral dysfunction (encephalopathy). There was no seizure or seizure predisposition recorded on this study. Please note that lack of epileptiform activity on EEG does not preclude the possibility of epilepsy. Ritta Slot, MD [facility] [phone] If 7pm- 7am, please page neurology on call as listed in AMION.  MR BRAIN WO CONTRAST Result Date: 06/07/2023 CLINICAL DATA:  Word-finding difficulties, aphasia, possible seizure EXAM: MRI HEAD WITHOUT CONTRAST TECHNIQUE: Multiplanar, multiecho pulse sequences of the brain and surrounding structures were obtained without intravenous contrast. COMPARISON:  10/03/2022 MRI head, correlation is also made with 06/06/2023 CT head FINDINGS: Evaluation is somewhat limited by motion artifact. Brain: Increased T2 hyperintense signal and asymmetric volume loss in the right hippocampus (series 7, image 17 and series 8, image 14), which may be related to encephalomalacia in the right frontal lobe or could represent mesial temporal sclerosis. No heterotopia or evidence of cortical dysgenesis. No restricted diffusion to suggest acute or subacute infarct. No acute hemorrhage, mass, mass effect, or midline shift. No hydrocephalus or extra-axial collection. Partial empty sella. Craniocervical junction within normal limits. No hemosiderin deposition to suggest remote hemorrhage. Cerebral volume within normal limits for age. Scattered T2 hyperintense signal in the periventricular white matter, likely the sequela of mild a chronic small vessel ischemic disease.  Vascular: Normal arterial flow voids. Skull and upper cervical spine: Normal marrow signal. Sinuses/Orbits: Mucous retention cyst in the left sphenoid sinus. Mild mucosal thickening in the ethmoid air cells. Status post bilateral lens replacements. No acute finding in the orbits. Other: The mastoid air cells are well aerated. IMPRESSION: 1. No acute intracranial process. No evidence of acute or subacute infarct. 2. Increased T2 hyperintense signal and asymmetric volume loss in the right hippocampus, which may be related to encephalomalacia in the right frontal lobe or could represent mesial temporal sclerosis. Electronically Signed   By: Wiliam Ke M.D.   On: 06/07/2023 01:27   CT Head Wo Contrast Result Date: 06/06/2023 CLINICAL DATA:  Head trauma, minor (Age >= 65y); Facial trauma, blunt EXAM: CT HEAD WITHOUT CONTRAST CT CERVICAL SPINE  WITHOUT CONTRAST TECHNIQUE: Multidetector CT imaging of the head and cervical spine was performed following the standard protocol without intravenous contrast. Multiplanar CT image reconstructions of the cervical spine were also generated. RADIATION DOSE REDUCTION: This exam was performed according to the departmental dose-optimization program which includes automated exposure control, adjustment of the mA and/or kV according to patient size and/or use of iterative reconstruction technique. COMPARISON:  CT head and C-spine 12/11/2022 FINDINGS: CT HEAD FINDINGS Brain: Chronic appearing bilateral subdural hygroma. Lower right anterior temporal encephalomalacia. No evidence of large-territorial acute infarction. No parenchymal hemorrhage. No mass lesion. No extra-axial collection. No mass effect or midline shift. No hydrocephalus. Basilar cisterns are patent. Vascular: No hyperdense vessel. Atherosclerotic calcifications are present within the cavernous internal carotid arteries. Skull: No acute fracture or focal lesion. Likely prior right orbital fracture. Sinuses/Orbits: Left  sphenoid sinus mucosal thickening. Otherwise paranasal sinuses and mastoid air cells are clear. The orbits are unremarkable. Other: None. CT CERVICAL SPINE FINDINGS Alignment: Grade 1 anterolisthesis of C3 on C4. Skull base and vertebrae: Multilevel moderate degenerative changes of the spine with associated posterior disc osteophyte complex formation. Severe osseous neural foraminal stenosis of the bilateral C5-C6 osseous neural foramina. No severe osseous central canal stenosis. No acute fracture. No aggressive appearing focal osseous lesion or focal pathologic process. Soft tissues and spinal canal: No prevertebral fluid or swelling. No visible canal hematoma. Upper chest: Unremarkable. Other: Atherosclerotic plaque of the aorta and its main branches. Patient is edentulous. IMPRESSION: 1. No acute intracranial abnormality. 2. No acute displaced fracture or traumatic listhesis of the cervical spine. 3. Multilevel moderate degenerative changes of the spine. Severe osseous neural foraminal stenosis of the bilateral C5-C6 osseous neural foramina. 4.  Aortic Atherosclerosis (ICD10-I70.0). Electronically Signed   By: Tish Frederickson M.D.   On: 06/06/2023 19:54   CT Cervical Spine Wo Contrast Result Date: 06/06/2023 CLINICAL DATA:  Head trauma, minor (Age >= 65y); Facial trauma, blunt EXAM: CT HEAD WITHOUT CONTRAST CT CERVICAL SPINE WITHOUT CONTRAST TECHNIQUE: Multidetector CT imaging of the head and cervical spine was performed following the standard protocol without intravenous contrast. Multiplanar CT image reconstructions of the cervical spine were also generated. RADIATION DOSE REDUCTION: This exam was performed according to the departmental dose-optimization program which includes automated exposure control, adjustment of the mA and/or kV according to patient size and/or use of iterative reconstruction technique. COMPARISON:  CT head and C-spine 12/11/2022 FINDINGS: CT HEAD FINDINGS Brain: Chronic appearing  bilateral subdural hygroma. Lower right anterior temporal encephalomalacia. No evidence of large-territorial acute infarction. No parenchymal hemorrhage. No mass lesion. No extra-axial collection. No mass effect or midline shift. No hydrocephalus. Basilar cisterns are patent. Vascular: No hyperdense vessel. Atherosclerotic calcifications are present within the cavernous internal carotid arteries. Skull: No acute fracture or focal lesion. Likely prior right orbital fracture. Sinuses/Orbits: Left sphenoid sinus mucosal thickening. Otherwise paranasal sinuses and mastoid air cells are clear. The orbits are unremarkable. Other: None. CT CERVICAL SPINE FINDINGS Alignment: Grade 1 anterolisthesis of C3 on C4. Skull base and vertebrae: Multilevel moderate degenerative changes of the spine with associated posterior disc osteophyte complex formation. Severe osseous neural foraminal stenosis of the bilateral C5-C6 osseous neural foramina. No severe osseous central canal stenosis. No acute fracture. No aggressive appearing focal osseous lesion or focal pathologic process. Soft tissues and spinal canal: No prevertebral fluid or swelling. No visible canal hematoma. Upper chest: Unremarkable. Other: Atherosclerotic plaque of the aorta and its main branches. Patient is edentulous. IMPRESSION: 1. No acute intracranial  abnormality. 2. No acute displaced fracture or traumatic listhesis of the cervical spine. 3. Multilevel moderate degenerative changes of the spine. Severe osseous neural foraminal stenosis of the bilateral C5-C6 osseous neural foramina. 4.  Aortic Atherosclerosis (ICD10-I70.0). Electronically Signed   By: Tish Frederickson M.D.   On: 06/06/2023 19:54    Microbiology: Results for orders placed or performed during the hospital encounter of 09/08/22  Resp panel by RT-PCR (RSV, Flu A&B, Covid) Anterior Nasal Swab     Status: None   Collection Time: 09/08/22  2:25 PM   Specimen: Anterior Nasal Swab  Result Value Ref  Range Status   SARS Coronavirus 2 by RT PCR NEGATIVE NEGATIVE Final    Comment: (NOTE) SARS-CoV-2 target nucleic acids are NOT DETECTED.  The SARS-CoV-2 RNA is generally detectable in upper respiratory specimens during the acute phase of infection. The lowest concentration of SARS-CoV-2 viral copies this assay can detect is 138 copies/mL. A negative result does not preclude SARS-Cov-2 infection and should not be used as the sole basis for treatment or other patient management decisions. A negative result may occur with  improper specimen collection/handling, submission of specimen other than nasopharyngeal swab, presence of viral mutation(s) within the areas targeted by this assay, and inadequate number of viral copies(<138 copies/mL). A negative result must be combined with clinical observations, patient history, and epidemiological information. The expected result is Negative.  Fact Sheet for Patients:  BloggerCourse.com  Fact Sheet for Healthcare Providers:  SeriousBroker.it  This test is no t yet approved or cleared by the [place] FDA and  has been authorized for detection and/or diagnosis of SARS-CoV-2 by FDA under an Emergency Use Authorization (EUA). This EUA will remain  in effect (meaning this test can be used) for the duration of the COVID-19 declaration under Section 564(b)(1) of the Act, 21 U.S.C.section 360bbb-3(b)(1), unless the authorization is terminated  or revoked sooner.       Influenza A by PCR NEGATIVE NEGATIVE Final   Influenza B by PCR NEGATIVE NEGATIVE Final    Comment: (NOTE) The Xpert Xpress SARS-CoV-2/FLU/RSV plus assay is intended as an aid in the diagnosis of influenza from Nasopharyngeal swab specimens and should not be used as a sole basis for treatment. Nasal washings and aspirates are unacceptable for Xpert Xpress SARS-CoV-2/FLU/RSV testing.  Fact Sheet for  Patients: BloggerCourse.com  Fact Sheet for Healthcare Providers: SeriousBroker.it  This test is not yet approved or cleared by the [place] FDA and has been authorized for detection and/or diagnosis of SARS-CoV-2 by FDA under an Emergency Use Authorization (EUA). This EUA will remain in effect (meaning this test can be used) for the duration of the COVID-19 declaration under Section 564(b)(1) of the Act, 21 U.S.C. section 360bbb-3(b)(1), unless the authorization is terminated or revoked.     Resp Syncytial Virus by PCR NEGATIVE NEGATIVE Final    Comment: (NOTE) Fact Sheet for Patients: BloggerCourse.com  Fact Sheet for Healthcare Providers: SeriousBroker.it  This test is not yet approved or cleared by the [place] FDA and has been authorized for detection and/or diagnosis of SARS-CoV-2 by FDA under an Emergency Use Authorization (EUA). This EUA will remain in effect (meaning this test can be used) for the duration of the COVID-19 declaration under Section 564(b)(1) of the Act, 21 U.S.C. section 360bbb-3(b)(1), unless the authorization is terminated or revoked.  Performed at [facility], [address] [place], [place] [address]   Blood Culture (routine x 2)     Status: None  Collection Time: 09/08/22  2:25 PM   Specimen: BLOOD  Result Value Ref Range Status   Specimen Description BLOOD BLOOD LEFT ARM  Final   Special Requests   Final    BOTTLES DRAWN AEROBIC AND ANAEROBIC Blood Culture adequate volume   Culture   Final    NO GROWTH 5 DAYS Performed at [facility], [address], [place], [place] [address]    Report Status 09/13/2022 FINAL  Final  Blood Culture (routine x 2)     Status: None   Collection Time: 09/08/22  2:27 PM   Specimen: BLOOD  Result Value Ref Range Status   Specimen Description BLOOD BLOOD RIGHT HAND  Final    Special Requests   Final    BOTTLES DRAWN AEROBIC AND ANAEROBIC Blood Culture adequate volume   Culture   Final    NO GROWTH 5 DAYS Performed at [facility], [address] [place], [place] [address]    Report Status 09/13/2022 FINAL  Final    Labs: CBC: Recent Labs  Lab 06/07/23 0054  WBC 7.7  NEUTROABS 6.3  HGB 13.0  HCT 38.4  MCV 100.8*  PLT 248   Basic Metabolic Panel: Recent Labs  Lab 06/06/23 1925  NA 137  K 3.9  CL 103  CO2 21*  GLUCOSE 89  BUN 13  CREATININE 0.77  CALCIUM 9.2   Liver Function Tests: No results for input(s): "AST", "ALT", "ALKPHOS", "BILITOT", "PROT", "ALBUMIN" in the last 168 hours. CBG: Recent Labs  Lab 06/06/23 1932 06/07/23 0152 06/08/23 0740 06/08/23 1146  GLUCAP 93 89 95 114*    Discharge time spent: 35 minutes.  Signed: Marcelino Duster, MD [facility] 06/10/2023

## 2023-06-10 NOTE — Progress Notes (Signed)
OT Cancellation Note  Patient Details Name: Marcia Brown MRN: [id number] DOB: Nov 28, 1955   Cancelled Treatment:    Reason Eval/Treat Not Completed: OT screened, no needs identified, will sign off. Consult received, chart reviewed. Spoke with PT who evaluated pt and chart review both indicating pt is independent with all ADL tasks, is currently working with therapy at her ALF, and has discharge orders in. Will hold acute OT evaluation at this time and re-attempt should pt not discharge today.   Arman Filter., MPH, MS, OTR/L ascom [phone] 06/10/23, 1:32 PM

## 2023-06-10 NOTE — Plan of Care (Signed)

## 2023-06-10 NOTE — [place] FL2 (Addendum)
[place] MEDICAID FL2 LEVEL OF CARE FORM     IDENTIFICATION  Patient Name: SAPNA FLORENCE Birthdate: [birth year] Sex: female Admission Date (Current Location): 06/06/2023  [place] and [place] Number:  [address] [id number] Q Facility and Address:  [facility], [address], [place], [place] [address]      Provider Number: [id number]  Attending Physician Name and Address:  Marcelino Duster, MD  Relative Name and Phone Number:  Chauncey Cruel   [phone]    Current Level of Care: Hospital (Assisted Living facilitiy) Recommended Level of Care: Assisted Living Facility Prior Approval Number:    Date Approved/Denied:   PASRR Number:    Discharge Plan:  (Assisted Living facility)    Current Diagnoses: Patient Active Problem List   Diagnosis Date Noted   Breakthrough seizure (HCC) 06/07/2023   Fall 11/13/2022   Closed fracture of right distal radius 11/13/2022   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   Right wrist fracture, closed, initial encounter 11/12/2022   COPD (chronic obstructive pulmonary disease) (HCC) 11/12/2022   Anxiety 11/12/2022   Sleep apnea 11/12/2022   Dizzy spells 11/12/2022   Wrist fracture, closed, right, initial encounter 11/12/2022   Seizures (HCC) 08/04/2019   Acute lower UTI 06/26/2019   Hypokalemia 06/26/2019   AMS (altered mental status) 06/26/2019   Tobacco abuse 12/27/2015   Repeated falls 05/24/2015   Seizure (HCC) 02/03/2015   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    Orientation RESPIRATION BLADDER Height & Weight     Self, Time, Situation  Normal Continent Weight: 66.8 kg Height:  5' (152.4 cm)  BEHAVIORAL SYMPTOMS/MOOD NEUROLOGICAL BOWEL NUTRITION STATUS    Convulsions/Seizures Continent  (See Discharge Summary)  AMBULATORY STATUS COMMUNICATION OF NEEDS Skin   Extensive Assist Verbally Normal                       Personal Care Assistance Level of Assistance  Bathing, Feeding, Dressing Bathing Assistance:  Maximum assistance Feeding assistance: Limited assistance Dressing Assistance: Limited assistance     Functional Limitations Info  Sight, Hearing, Speech Sight Info: Adequate Hearing Info: Adequate Speech Info: Adequate    SPECIAL CARE FACTORS FREQUENCY  PT (By licensed PT), OT (By licensed OT)     PT Frequency: 2-3X weekly OT Frequency: 2-3x weekly            Contractures Contractures Info: Not present    Additional Factors Info  Code Status, Allergies Code Status Info: Full Code Allergies Info: Ciprofloxacin, Aspirin           Current Medications (06/10/2023):  This is the current hospital active medication list Current Facility-Administered Medications  Medication Dose Route Frequency Provider Last Rate Last Admin   acetaminophen (TYLENOL) tablet 650 mg  650 mg Oral Q4H PRN Andris Baumann, MD   650 mg at 06/08/23 0012   Or   acetaminophen (TYLENOL) suppository 650 mg  650 mg Rectal Q4H PRN Andris Baumann, MD       albuterol (PROVENTIL) (2.5 MG/3ML) 0.083% nebulizer solution 2.5 mg  2.5 mg Nebulization Q4H PRN Otelia Sergeant, RPH       carBAMazepine (TEGRETOL) 100 MG/5ML suspension 150 mg  150 mg Oral Q12H Rejeana Brock, MD   150 mg at 06/10/23 7846   clopidogrel (PLAVIX) tablet 75 mg  75 mg Oral Daily Sreeram, Lynne Logan, MD       cyanocobalamin (VITAMIN B12) tablet 1,000 mcg  1,000 mcg Oral Daily Marcelino Duster, MD   1,000 mcg at  06/10/23 0823   DULoxetine (CYMBALTA) DR capsule 40 mg  40 mg Oral BID Andris Baumann, MD   40 mg at 06/10/23 8119   enoxaparin (LOVENOX) injection 40 mg  40 mg Subcutaneous Q24H Lindajo Royal V, MD   40 mg at 06/10/23 1478   gabapentin (NEURONTIN) capsule 200 mg  200 mg Oral QHS Rejeana Brock, MD   200 mg at 06/09/23 2119   lacosamide (VIMPAT) tablet 150 mg  150 mg Oral BID Delton Prairie, MD   150 mg at 06/10/23 2956   lamoTRIgine (LAMICTAL) tablet 200 mg  200 mg Oral Daily Foye Deer, RPH   200 mg at  06/10/23 2130   lamoTRIgine (LAMICTAL) tablet 300 mg  300 mg Oral QHS Foye Deer, RPH   300 mg at 06/09/23 2118   levETIRAcetam (KEPPRA) tablet 1,000 mg  1,000 mg Oral QHS Marcelino Duster, MD   1,000 mg at 06/09/23 2118   levETIRAcetam (KEPPRA) tablet 500 mg  500 mg Oral Daily Marcelino Duster, MD   500 mg at 06/10/23 8657   LORazepam (ATIVAN) injection 2 mg  2 mg Intravenous Q5 Min x 2 PRN Andris Baumann, MD       mometasone-formoterol [facility]) 200-5 MCG/ACT inhaler 2 puff  2 puff Inhalation BID Andris Baumann, MD   2 puff at 06/10/23 0824   ondansetron [facility]) tablet 4 mg  4 mg Oral Q6H PRN Andris Baumann, MD       Or   ondansetron [facility]) injection 4 mg  4 mg Intravenous Q6H PRN Andris Baumann, MD       Oral care mouth rinse  15 mL Mouth Rinse PRN Andris Baumann, MD       pantoprazole (PROTONIX) EC tablet 20 mg  20 mg Oral Daily Lindajo Royal V, MD   20 mg at 06/10/23 0825   phenytoin (DILANTIN) ER capsule 100 mg  100 mg Oral BID Andris Baumann, MD   100 mg at 06/10/23 8469   QUEtiapine (SEROQUEL) tablet 25 mg  25 mg Oral QHS Andris Baumann, MD   25 mg at 06/09/23 2119   rosuvastatin (CRESTOR) tablet 5 mg  5 mg Oral Daily Andris Baumann, MD   5 mg at 06/10/23 6295     Discharge Medications: Please see discharge summary for a list of discharge medications.  TAKE these medications     acetaminophen 325 MG tablet Commonly known as: TYLENOL Take 650 mg by mouth in the morning, at noon, and at bedtime. Take 650 mg by mouth 3 (three) times daily GIVEN WITH TRAMADOL 0800/1400/2000    albuterol 108 (90 Base) MCG/ACT inhaler Commonly known as: VENTOLIN HFA Inhale 2 puffs into the lungs every 4 (four) hours as needed for wheezing or shortness of breath.    alum & mag hydroxide-simeth 200-200-20 MG/5ML suspension Commonly known as: MAALOX/MYLANTA Take 30 mLs by mouth every 6 (six) hours as needed for indigestion or heartburn.    azelastine 0.05 % ophthalmic  solution Commonly known as: OPTIVAR Place 1 drop into both eyes 2 (two) times daily.    budesonide-formoterol 160-4.5 MCG/ACT inhaler Commonly known as: SYMBICORT Inhale 2 puffs into the lungs 2 (two) times daily.    carbamazepine 100 MG chewable tablet Commonly known as: TEGRETOL Chew 150 mg by mouth in the morning and at bedtime.    celecoxib 100 MG capsule Commonly known as: CELEBREX Take 100 mg by mouth 2 (two) times daily.  cholecalciferol 10 MCG (400 UNIT) Tabs tablet Commonly known as: VITAMIN D3 Take 400 Units by mouth daily.    clopidogrel 75 MG tablet Commonly known as: PLAVIX Take 75 mg by mouth daily.    cyanocobalamin 1000 MCG tablet Take 1 tablet (1,000 mcg total) by mouth daily. Start taking on: June 11, 2023    diphenhydrAMINE 25 MG tablet Commonly known as: BENADRYL Take 25 mg by mouth every 6 (six) hours as needed for allergies (allergic reaction).    docusate sodium 50 MG capsule Commonly known as: COLACE Take 50 mg by mouth daily.    DULoxetine HCl 40 MG Cpep Take 40 mg by mouth 2 (two) times daily.    gabapentin 100 MG capsule Commonly known as: NEURONTIN Take 200 mg by mouth at bedtime.    guaiFENesin 100 MG/5ML liquid Commonly known as: ROBITUSSIN Take 15 mLs by mouth every 6 (six) hours as needed for cough.    hydrocortisone cream 1 % Apply 1 Application topically daily as needed (skin rash/ irritation).    lamoTRIgine 100 MG tablet Commonly known as: LaMICtal Take 2 tablets (200 mg total) by mouth daily. What changed:  how much to take when to take this    lamoTRIgine 100 MG tablet Commonly known as: LAMICTAL Take 3 tablets (300 mg total) by mouth at bedtime. What changed:  medication strength how much to take when to take this    levETIRAcetam 500 MG tablet Commonly known as: KEPPRA Take 1-2 tablets (500-1,000 mg total) by mouth 2 (two) times daily. Take one tablet (500) by mouth daily in the morning and two tablets  (1000 mg) in the evening    loperamide 2 MG capsule Commonly known as: IMODIUM Take 4 mg by mouth as needed for diarrhea or loose stools.    loratadine 10 MG tablet Commonly known as: CLARITIN Take 10 mg by mouth daily.    losartan 50 MG tablet Commonly known as: COZAAR Take 50 mg by mouth daily.    magnesium hydroxide 400 MG/5ML suspension Commonly known as: MILK OF MAGNESIA Take 30 mLs by mouth daily as needed for mild constipation or moderate constipation.    multivitamin with minerals Tabs tablet Take 1 tablet by mouth daily.    pantoprazole 20 MG tablet Commonly known as: PROTONIX Take 20 mg by mouth at bedtime.    phenytoin 100 MG ER capsule Commonly known as: DILANTIN Take 100 mg by mouth 2 (two) times daily.    QUEtiapine 25 MG tablet Commonly known as: SEROQUEL Take 25 mg by mouth at bedtime.    rosuvastatin 5 MG tablet Commonly known as: CRESTOR Take 5 mg by mouth daily at 8 pm.    sodium chloride 0.65 % nasal spray Commonly known as: OCEAN Place 2 sprays into the nose 2 (two) times daily as needed (dry nose/epitaxis). 2 Spray(s) Both Nares Twice Daily PRN for dry nose/epistaxis    sucralfate 1 g tablet Commonly known as: CARAFATE Take 1 g by mouth 2 (two) times daily. Take on empty stomach    Vimpat 150 MG Tabs Generic drug: Lacosamide Take 1 tablet by mouth 2 (two) times daily.            Relevant Imaging Results:  Relevant Lab Results:   Additional Information [id number]  Garret Reddish, RN

## 2023-06-10 NOTE — Plan of Care (Signed)

## 2023-06-10 NOTE — Progress Notes (Signed)
Mobility Specialist - Progress Note   06/10/23 1044  Mobility  Activity Ambulated with assistance in room  Level of Assistance Standby assist, set-up cues, supervision of patient - no hands on  Assistive Device None  Distance Ambulated (ft) 12 ft  Activity Response Tolerated well  Mobility visit 1 Mobility  Mobility Specialist Start Time (ACUTE ONLY) [id number]  Mobility Specialist Stop Time (ACUTE ONLY) 0956  Mobility Specialist Time Calculation (min) (ACUTE ONLY) 18 min   MS responding to bed alarm, Pt seated EOB upon entry. Pt completed bed mob indep, STS and dons top, bottoms, socks and shoes indep. Pt amb around the bed to the recliner with supervision. Pt left seated in the recliner with alarm set and needs within reach.  Zetta Bills Mobility Specialist 06/10/23 10:57 AM

## 2023-06-11 LAB — VITAMIN B1: Vitamin B1 (Thiamine): 133.2 nmol/L (ref 66.5–200.0)

## 2023-06-12 LAB — METHYLMALONIC ACID, SERUM: Methylmalonic Acid, Quantitative: 264 nmol/L (ref 0–378)

## 2023-07-05 ENCOUNTER — Other Ambulatory Visit: Payer: Self-pay

## 2023-07-05 ENCOUNTER — Emergency Department: Payer: Medicare Other

## 2023-07-05 ENCOUNTER — Emergency Department
Admission: EM | Admit: 2023-07-05 | Discharge: 2023-07-05 | Disposition: A | Discharge status: Home / Self Care | Payer: Medicare Other | Attending: Emergency Medicine | Admitting: Emergency Medicine

## 2023-07-05 DIAGNOSIS — F0390 Unspecified dementia without behavioral disturbance: Secondary | ICD-10-CM | POA: Diagnosis not present

## 2023-07-05 DIAGNOSIS — S62501A Fracture of unspecified phalanx of right thumb, initial encounter for closed fracture: Secondary | ICD-10-CM

## 2023-07-05 DIAGNOSIS — W19XXXA Unspecified fall, initial encounter: Secondary | ICD-10-CM | POA: Insufficient documentation

## 2023-07-05 DIAGNOSIS — S62524A Nondisplaced fracture of distal phalanx of right thumb, initial encounter for closed fracture: Secondary | ICD-10-CM | POA: Insufficient documentation

## 2023-07-05 DIAGNOSIS — M79644 Pain in right finger(s): Secondary | ICD-10-CM | POA: Diagnosis present

## 2023-07-05 NOTE — ED Triage Notes (Signed)
Pt from the [place] [facility] [place] via ems with reports of thumb injury that occurred last night.

## 2023-07-05 NOTE — ED Triage Notes (Signed)
Pt to ED via [facility] from [facility] [place]. EMS reports facility states mechanical fall that occurred last night resulting in right thumb injury. Pt has dementia. Pt rambling in triage. Pt reports right thumb pain.

## 2023-07-05 NOTE — Discharge Instructions (Signed)
You have been diagnosed with fracture of the right thumb.  Please do not submerge your right hand in water.  Please make an appointment with orthopedics for 1 week follow-up.  You can take acetaminophen 500 mg for pain.

## 2023-07-05 NOTE — ED Notes (Signed)
[facility] tx provided report previously called per Kathlene November RN ems denies further needs pt dc paperwork provided

## 2023-07-05 NOTE — ED Provider Notes (Signed)
[facility] Provider Note    Event Date/Time   First MD Initiated Contact with Patient 07/05/23 1844     (approximate)   History   Thumb Injury    HPI  Marcia Brown is a 68 y.o. female who presents today after falling and injuring her right thumb last night.  Patient was brought by  [facility] from [place] [facility] [place].  They state patient has dementia.     Physical Exam   Triage Vital Signs: ED Triage Vitals  Encounter Vitals Group     BP 07/05/23 1723 111/69     Systolic BP Percentile --      Diastolic BP Percentile --      Pulse --      Resp 07/05/23 1723 17     Temp 07/05/23 1723 97.8 F (36.6 C)     Temp Source 07/05/23 1723 Oral     SpO2 07/05/23 1723 100 %     Weight --      Height --      Head Circumference --      Peak Flow --      Pain Score 07/05/23 1731 5     Pain Loc --      Pain Education --      Exclude from Growth Chart --     Most recent vital signs: Vitals:   07/05/23 1723  BP: 111/69  Resp: 17  Temp: 97.8 F (36.6 C)  SpO2: 100%     Constitutional: Alert nondistressed  eyes: Conjunctivae are normal.  Head: Atraumatic. Nose: No congestion/rhinnorhea. Mouth/Throat: Mucous membranes are moist.   Neck: Painless ROM.  Cardiovascular:   Good peripheral circulation. Respiratory: Normal respiratory effort.  No retractions.  Gastrointestinal: Soft and nontender.  Musculoskeletal:  no deformity Right hand: Right thumb: Skin is intact presence of dried blood at the level of the nail, hematoma plan edema in the base of the distal phalange.  Full ROM limited by pain Neurologic:  MAE spontaneously. No gross focal neurologic deficits are appreciated.  Skin:  Skin is warm, dry and intact. No rash noted. Psychiatric: Mood and affect are normal. Speech and behavior are normal.    ED Results / Procedures / Treatments   Labs (all labs ordered are listed, but only abnormal results are displayed) Labs Reviewed - No data to  display   EKG  RADIOLOGY I independently reviewed and interpreted imaging and agree with radiologists findings.      PROCEDURES:  Critical Care performed:   .Splint Application  Date/Time: 07/05/2023 8:37 PM  Performed by: Gladys Damme, PA-C Authorized by: Gladys Damme, PA-C   Consent:    Consent obtained:  Verbal   Consent given by:  Patient   Risks, benefits, and alternatives were discussed: yes   Universal protocol:    Patient identity confirmed:  Verbally with patient Pre-procedure details:    Distal neurologic exam:  Normal   Distal perfusion: distal pulses strong   Procedure details:    Location:  Wrist   Wrist location:  R wrist   Cast type:  Finger   Splint type:  Thumb spica   Supplies:  Elastic bandage Post-procedure details:    Distal neurologic exam:  Normal   Procedure completion:  Tolerated well, no immediate complications    MEDICATIONS ORDERED IN ED: Medications - No data to display   IMPRESSION / MDM / ASSESSMENT AND PLAN / ED COURSE  I reviewed the triage vital signs and the nursing  notes.  Differential diagnosis includes, but is not limited to, right thumb fracture, right thumb dislocation, right thumb laceration  Patient's presentation is most consistent with acute complicated illness / injury requiring diagnostic workup.   Patient's diagnosis is consistent with nondisplaced transverse fracture on the base of the distal phalanges of the right thumb. I independently reviewed and interpreted imaging and agree with radiologists finding. I did review the patient's allergies and medications. Patient will be discharged home with prescriptions for acetaminophen. Patient is to follow up with orthopedics as needed or otherwise directed. Patient is given ED precautions to return to the ED for any worsening or new symptoms. Discussed plan of care with patient, answered all of patient's questions, Patient agreeable to plan of care. Advised patient  to take medications according to the instructions on the label. Discussed possible side effects of new medications. Patient verbalized understanding.     FINAL CLINICAL IMPRESSION(S) / ED DIAGNOSES   Final diagnoses:  Closed nondisplaced fracture of phalanx of right thumb, unspecified phalanx, initial encounter     Rx / DC Orders   ED Discharge Orders     None        Note:  This document was prepared using Dragon voice recognition software and may include unintentional dictation errors.   Gladys Damme, PA-C 07/05/23 2047    Jene Every, MD 07/05/23 2107

## 2023-07-27 ENCOUNTER — Other Ambulatory Visit: Payer: Self-pay

## 2023-07-27 ENCOUNTER — Emergency Department
Admission: EM | Admit: 2023-07-27 | Discharge: 2023-07-28 | Disposition: A | Discharge status: Home / Self Care | Payer: Medicare Other | Attending: Emergency Medicine | Admitting: Emergency Medicine

## 2023-07-27 ENCOUNTER — Emergency Department: Payer: Medicare Other

## 2023-07-27 DIAGNOSIS — S0181XA Laceration without foreign body of other part of head, initial encounter: Secondary | ICD-10-CM | POA: Diagnosis not present

## 2023-07-27 DIAGNOSIS — W19XXXA Unspecified fall, initial encounter: Secondary | ICD-10-CM | POA: Insufficient documentation

## 2023-07-27 DIAGNOSIS — S0990XA Unspecified injury of head, initial encounter: Secondary | ICD-10-CM | POA: Diagnosis present

## 2023-07-27 LAB — CBC WITH DIFFERENTIAL/PLATELET
Abs Immature Granulocytes: 0.01 10*3/uL (ref 0.00–0.07)
Basophils Absolute: 0 10*3/uL (ref 0.0–0.1)
Basophils Relative: 1 %
Eosinophils Absolute: 0.1 10*3/uL (ref 0.0–0.5)
Eosinophils Relative: 3 %
HCT: 31.9 % — ABNORMAL LOW (ref 36.0–46.0)
Hemoglobin: 10.9 g/dL — ABNORMAL LOW (ref 12.0–15.0)
Immature Granulocytes: 0 %
Lymphocytes Relative: 33 %
Lymphs Abs: 1.3 10*3/uL (ref 0.7–4.0)
MCH: 34.8 pg — ABNORMAL HIGH (ref 26.0–34.0)
MCHC: 34.2 g/dL (ref 30.0–36.0)
MCV: 101.9 fL — ABNORMAL HIGH (ref 80.0–100.0)
Monocytes Absolute: 0.4 10*3/uL (ref 0.1–1.0)
Monocytes Relative: 11 %
Neutro Abs: 2.1 10*3/uL (ref 1.7–7.7)
Neutrophils Relative %: 52 %
Platelets: 221 10*3/uL (ref 150–400)
RBC: 3.13 MIL/uL — ABNORMAL LOW (ref 3.87–5.11)
RDW: 13.6 % (ref 11.5–15.5)
WBC: 4 10*3/uL (ref 4.0–10.5)
nRBC: 0 % (ref 0.0–0.2)

## 2023-07-27 LAB — BASIC METABOLIC PANEL
Anion gap: 10 (ref 5–15)
BUN: 14 mg/dL (ref 8–23)
CO2: 22 mmol/L (ref 22–32)
Calcium: 8.7 mg/dL — ABNORMAL LOW (ref 8.9–10.3)
Chloride: 105 mmol/L (ref 98–111)
Creatinine, Ser: 0.76 mg/dL (ref 0.44–1.00)
GFR, Estimated: >60 mL/min (ref >60)
Glucose, Bld: 85 mg/dL (ref 70–99)
Potassium: 3.8 mmol/L (ref 3.5–5.1)
Sodium: 137 mmol/L (ref 135–145)

## 2023-07-27 MED ORDER — LIDOCAINE HCL (PF) 1 % IJ SOLN
5.0000 mL | Freq: Once | INTRAMUSCULAR | Status: DC
  Filled 2023-07-27: qty 5

## 2023-07-27 NOTE — ED Triage Notes (Addendum)
 Pt sts she was going to the bathroom and tripped over an object falling hitting her head. Pt is on  Plavix 75mg 

## 2023-07-27 NOTE — ED Triage Notes (Signed)
 Pt was sent from the [place] [facility] [place] for an unwitnessed mechanical fall tonight. Per ems no loc no anticoagulants, has dressing to head d/t laceration which bleeding is controlled.

## 2023-07-27 NOTE — ED Provider Notes (Signed)
 [facility] Provider Note    Event Date/Time   First MD Initiated Contact with Patient 07/27/23 2150     (approximate)   History   Fall   HPI  Marcia Brown is a 68 y.o. female who presents to the emergency department today after a fall.  The patient states she was in her bathroom when she fell.  Thinks it might of made it hard for her to keep her balance because her right hand is wrapped secondary to a fracture that she was seen for a few weeks ago.  She did hit her head.  She denies pain anywhere else.  She denies any chest pain or palpitations when the fall occurred.     Physical Exam   Triage Vital Signs: ED Triage Vitals [07/27/23 2152]  Encounter Vitals Group     BP (!) 141/71     Systolic BP Percentile      Diastolic BP Percentile      Pulse Rate (!) 55     Resp 20     Temp 98.3 F (36.8 C)     Temp Source Oral     SpO2 98 %     Weight 146 lb 12.8 oz (66.6 kg)     Height      Head Circumference      Peak Flow      Pain Score      Pain Loc      Pain Education      Exclude from Growth Chart     Most recent vital signs: Vitals:   07/27/23 2152  BP: (!) 141/71  Pulse: (!) 55  Resp: 20  Temp: 98.3 F (36.8 C)  SpO2: 98%   General: Awake, alert, oriented. CV:  Good peripheral perfusion.  Resp:  Normal effort.  Abd:  No distention.  Other:  1 cm laceration to left forehead.   ED Results / Procedures / Treatments   Labs (all labs ordered are listed, but only abnormal results are displayed) Labs Reviewed  CBC WITH DIFFERENTIAL/PLATELET - Abnormal; Notable for the following components:      Result Value   RBC 3.13 (*)    Hemoglobin 10.9 (*)    HCT 31.9 (*)    MCV 101.9 (*)    MCH 34.8 (*)    All other components within normal limits  BASIC METABOLIC PANEL - Abnormal; Notable for the following components:   Calcium 8.7 (*)    All other components within normal limits      RADIOLOGY I independently interpreted and  visualized the CT head/cervical spine. My interpretation: No ICH. No fracture Radiology interpretation:  IMPRESSION:  1.  No acute intracranial abnormality.  2. No acute displaced fracture or traumatic listhesis of the  cervical spine.  3. Left frontal scalp 9 mm hematoma.  4. Right frontal scalp hematoma formation.  5. Right upper lobe 0.5 mm ground-glass airspace opacity. No  follow-up recommended. This recommendation follows the consensus  statement: Guidelines for Management of Incidental Pulmonary Nodules  Detected on CT Images: From the Fleischner Society 2017; Radiology  2017; 284:228-243.  6.  Aortic Atherosclerosis (ICD10-I70.0).       PROCEDURES:  Critical Care performed: No  Procedures  LACERATION REPAIR Performed by: Phineas Semen Authorized by: Phineas Semen Consent: Verbal consent obtained. Risks and benefits: risks, benefits and alternatives were discussed Consent given by: patient Patient identity confirmed: provided demographic data Prepped and Draped in normal sterile fashion Wound explored  Laceration Location: left forehead  Laceration Length: 1 cm  No Foreign Bodies seen or palpated  Anesthesia: local infiltration  Local anesthetic: lidocaine 1% without epinephrine  Anesthetic total: 2 ml  Irrigation method: syringe Amount of cleaning: standard  Skin closure: 5-0 vicryl rapide  Number of sutures: 3  Technique: simple interrupted  Patient tolerance: Patient tolerated the procedure well with no immediate complications.   MEDICATIONS ORDERED IN ED: Medications - No data to display   IMPRESSION / MDM / ASSESSMENT AND PLAN / ED COURSE  I reviewed the triage vital signs and the nursing notes.                              Differential diagnosis includes, but is not limited to, fracture, ICH, electrolyte abnormality  Patient's presentation is most consistent with acute presentation with potential threat to life or bodily  function.  Patient presented to the emergency department today after a fall and suffering a laceration to her forehead.  On exam she does have a 1 cm laceration to her left forehead.  This was sutured closed.  Did obtain CT scan of the head and cervical spine which not show any concerning traumatic findings.  Blood work shows slight anemia.  No significant electrolyte abnormality.  No leukocytosis.  Patient continues to feel fine here in the emergency department.  I think it is reasonable for patient to be discharged home.     FINAL CLINICAL IMPRESSION(S) / ED DIAGNOSES   Final diagnoses:  Fall, initial encounter  Laceration of forehead, initial encounter      Note:  This document was prepared using Dragon voice recognition software and may include unintentional dictation errors.    Phineas Semen, MD 07/27/23 2259

## 2023-07-27 NOTE — Discharge Instructions (Signed)
 Please seek medical attention for any high fevers, chest pain, shortness of breath, change in behavior, persistent vomiting, bloody stool or any other new or concerning symptoms.

## 2023-09-15 ENCOUNTER — Other Ambulatory Visit: Payer: Self-pay

## 2023-09-15 ENCOUNTER — Emergency Department
Admission: EM | Admit: 2023-09-15 | Discharge: 2023-09-15 | Disposition: A | Discharge status: Home / Self Care | Attending: Emergency Medicine | Admitting: Emergency Medicine

## 2023-09-15 DIAGNOSIS — R569 Unspecified convulsions: Secondary | ICD-10-CM | POA: Diagnosis present

## 2023-09-15 HISTORY — DX: Hyperlipidemia, unspecified: E78.5

## 2023-09-15 HISTORY — DX: Gastro-esophageal reflux disease without esophagitis: K21.9

## 2023-09-15 HISTORY — DX: Polyneuropathy, unspecified: G62.9

## 2023-09-15 HISTORY — DX: Unspecified intracranial injury with loss of consciousness status unknown, initial encounter: S06.9XAA

## 2023-09-15 HISTORY — DX: Unspecified dementia, unspecified severity, without behavioral disturbance, psychotic disturbance, mood disturbance, and anxiety: F03.90

## 2023-09-15 HISTORY — DX: Unspecified osteoarthritis, unspecified site: M19.90

## 2023-09-15 HISTORY — DX: Disorder of kidney and ureter, unspecified: N28.9

## 2023-09-15 LAB — CBC
HCT: 35.5 % — ABNORMAL LOW (ref 36.0–46.0)
Hemoglobin: 11.7 g/dL — ABNORMAL LOW (ref 12.0–15.0)
MCH: 34.9 pg — ABNORMAL HIGH (ref 26.0–34.0)
MCHC: 33 g/dL (ref 30.0–36.0)
MCV: 106 fL — ABNORMAL HIGH (ref 80.0–100.0)
Platelets: 196 10*3/uL (ref 150–400)
RBC: 3.35 MIL/uL — ABNORMAL LOW (ref 3.87–5.11)
RDW: 12.6 % (ref 11.5–15.5)
WBC: 4.8 10*3/uL (ref 4.0–10.5)
nRBC: 0 % (ref 0.0–0.2)

## 2023-09-15 LAB — BASIC METABOLIC PANEL WITH GFR
Anion gap: 8 (ref 5–15)
BUN: 24 mg/dL — ABNORMAL HIGH (ref 8–23)
CO2: 25 mmol/L (ref 22–32)
Calcium: 8.9 mg/dL (ref 8.9–10.3)
Chloride: 108 mmol/L (ref 98–111)
Creatinine, Ser: 0.83 mg/dL (ref 0.44–1.00)
GFR, Estimated: >60 mL/min (ref >60)
Glucose, Bld: 102 mg/dL — ABNORMAL HIGH (ref 70–99)
Potassium: 4.1 mmol/L (ref 3.5–5.1)
Sodium: 141 mmol/L (ref 135–145)

## 2023-09-15 LAB — PHENOBARBITAL LEVEL: Phenobarbital: <5 ug/mL — ABNORMAL LOW (ref 15.0–40.0)

## 2023-09-15 LAB — PHENYTOIN LEVEL, TOTAL: Phenytoin Lvl: 17.7 ug/mL (ref 10.0–20.0)

## 2023-09-15 NOTE — ED Notes (Signed)
 Pillows placed on bed for seizure pads. EDP at bedside.

## 2023-09-15 NOTE — ED Provider Notes (Signed)
 [facility] Provider Note    Event Date/Time   First MD Initiated Contact with Patient 09/15/23 1634     (approximate)   History   Seizures   HPI Marcia Brown is a 68 y.o. female with prior history of TBI, seizure presenting today for seizure.  Reportedly had seizure witnessed by staff at her living facility.  By the time of EMS arrival she was ambulatory on scene and no longer postictal.  She denies any other injuries or acute complaints at this time.  She does not remember the seizure.  She denies any numbness, weakness, chest pain, headache, abdominal pain, nausea, vomiting.  No recent cough, congestion, dysuria, diarrhea, constipation.  No other recent seizures over the past week.  Chart review: Patient had admission in December 2024 for breakthrough seizures.  At that time she was having multiple seizures and required medication adjustment.  Improved during hospitalization and discharged on that regimen.  No reported changes that were aware of.     Physical Exam   Triage Vital Signs: ED Triage Vitals  Encounter Vitals Group     BP 09/15/23 1633 132/63     Systolic BP Percentile --      Diastolic BP Percentile --      Pulse Rate 09/15/23 1633 69     Resp 09/15/23 1633 20     Temp 09/15/23 1636 99 F (37.2 C)     Temp Source 09/15/23 1633 Oral     SpO2 09/15/23 1633 97 %     Weight 09/15/23 1635 150 lb (68 kg)     Height 09/15/23 1635 5\' 4"  (1.626 m)     Head Circumference --      Peak Flow --      Pain Score 09/15/23 1630 0     Pain Loc --      Pain Education --      Exclude from Growth Chart --     Most recent vital signs: Vitals:   09/15/23 1636 09/15/23 1700  BP:  139/69  Pulse:  61  Resp:  14  Temp: 99 F (37.2 C)   SpO2:  98%   Physical Exam: I have reviewed the vital signs and nursing notes. General: Awake, alert, no acute distress.  Nontoxic appearing. Head:  Atraumatic, normocephalic.   ENT:  EOM intact, PERRL. Oral  mucosa is pink and moist with no lesions. Neck: Neck is supple with full range of motion, No meningeal signs. Cardiovascular:  RRR, No murmurs. Peripheral pulses palpable and equal bilaterally. Respiratory:  Symmetrical chest wall expansion.  No rhonchi, rales, or wheezes.  Good air movement throughout.  No use of accessory muscles.   Musculoskeletal:  No cyanosis or edema. Moving extremities with full ROM Abdomen:  Soft, nontender, nondistended. Neuro:  GCS 15, moving all four extremities, interacting appropriately. Speech clear.  Cranial nerves II through XII intact.  5 out of 5 strength throughout bilateral upper and lower extremities.  Sensation equal and intact throughout bilateral upper and lower extremities. Psych:  Calm, appropriate.   Skin:  Warm, dry, no rash.    ED Results / Procedures / Treatments   Labs (all labs ordered are listed, but only abnormal results are displayed) Labs Reviewed  BASIC METABOLIC PANEL WITH GFR - Abnormal; Notable for the following components:      Result Value   Glucose, Bld 102 (*)    BUN 24 (*)    All other components within normal limits  CBC - Abnormal; Notable for the following components:   RBC 3.35 (*)    Hemoglobin 11.7 (*)    HCT 35.5 (*)    MCV 106.0 (*)    MCH 34.9 (*)    All other components within normal limits  PHENYTOIN LEVEL, TOTAL  PHENOBARBITAL LEVEL     EKG My EKG interpretation: Rate of 60, normal sinus rhythm, normal axis, normal intervals.  No acute ST elevations or depressions   RADIOLOGY    PROCEDURES:  Critical Care performed: No  Procedures   MEDICATIONS ORDERED IN ED: Medications - No data to display   IMPRESSION / MDM / ASSESSMENT AND PLAN / ED COURSE  I reviewed the triage vital signs and the nursing notes.                              Differential diagnosis includes, but is not limited to, seizure, dehydration, electrolyte abnormality  Patient's presentation is most consistent with  exacerbation of chronic illness.  Patient is a 68 year old female with known history of seizure presenting today for the same.  Reportedly had 1 brief episode at her living facility but was not even postictal by the time of EMS arrival.  She has no acute complaints here.  Physical exam unremarkable.  Vital signs stable.  CBC and BMP largely unremarkable.  Phenytoin level consistent with her baseline.  Also sent off phenobarbital level which is a send out will not come back today.  Patient was reassessed and still denies any complaints and is well-appearing.  Given only a one-time seizure and she is back to her baseline, will discharge back to her facility with outpatient neurology follow-up.  Strict return precautions were documented in her discharge paperwork.  The patient is on the cardiac monitor to evaluate for evidence of arrhythmia and/or significant heart rate changes.     FINAL CLINICAL IMPRESSION(S) / ED DIAGNOSES   Final diagnoses:  Seizure (HCC)     Rx / DC Orders   ED Discharge Orders     None        Note:  This document was prepared using Dragon voice recognition software and may include unintentional dictation errors.   Janith Lima, MD 09/15/23 [phone]

## 2023-09-15 NOTE — Discharge Instructions (Signed)
 Her laboratory workup was reassuring today.  Please have her follow-up with neurology outpatient.  If she continues to have frequent breakthrough seizures, she will need to see them sooner or possibly come back to the hospital for further evaluation.

## 2023-09-15 NOTE — ED Notes (Signed)
Pt resting comfortably, respirations unlabored

## 2023-09-15 NOTE — ED Triage Notes (Signed)
 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

## 2023-09-27 ENCOUNTER — Other Ambulatory Visit: Payer: Self-pay

## 2023-09-27 ENCOUNTER — Emergency Department

## 2023-09-27 ENCOUNTER — Emergency Department
Admission: EM | Admit: 2023-09-27 | Discharge: 2023-09-28 | Disposition: A | Discharge status: Other Acute Inpatient Hospital | Attending: Emergency Medicine | Admitting: Emergency Medicine

## 2023-09-27 ENCOUNTER — Encounter (HOSPITAL_COMMUNITY): Payer: Self-pay

## 2023-09-27 DIAGNOSIS — R4701 Aphasia: Secondary | ICD-10-CM | POA: Diagnosis not present

## 2023-09-27 DIAGNOSIS — Z9282 Status post administration of tPA (rtPA) in a different facility within the last 24 hours prior to admission to current facility: Secondary | ICD-10-CM

## 2023-09-27 DIAGNOSIS — I708 Atherosclerosis of other arteries: Secondary | ICD-10-CM | POA: Diagnosis not present

## 2023-09-27 DIAGNOSIS — I639 Cerebral infarction, unspecified: Secondary | ICD-10-CM | POA: Diagnosis present

## 2023-09-27 DIAGNOSIS — J449 Chronic obstructive pulmonary disease, unspecified: Secondary | ICD-10-CM | POA: Diagnosis not present

## 2023-09-27 DIAGNOSIS — R4182 Altered mental status, unspecified: Secondary | ICD-10-CM | POA: Insufficient documentation

## 2023-09-27 DIAGNOSIS — F0390 Unspecified dementia without behavioral disturbance: Secondary | ICD-10-CM | POA: Insufficient documentation

## 2023-09-27 DIAGNOSIS — R29810 Facial weakness: Secondary | ICD-10-CM | POA: Diagnosis present

## 2023-09-27 LAB — DIFFERENTIAL
Abs Immature Granulocytes: 0 10*3/uL (ref 0.00–0.07)
Basophils Absolute: 0 10*3/uL (ref 0.0–0.1)
Basophils Relative: 1 %
Eosinophils Absolute: 0.1 10*3/uL (ref 0.0–0.5)
Eosinophils Relative: 4 %
Immature Granulocytes: 0 %
Lymphocytes Relative: 41 %
Lymphs Abs: 1.5 10*3/uL (ref 0.7–4.0)
Monocytes Absolute: 0.4 10*3/uL (ref 0.1–1.0)
Monocytes Relative: 12 %
Neutro Abs: 1.5 10*3/uL — ABNORMAL LOW (ref 1.7–7.7)
Neutrophils Relative %: 42 %

## 2023-09-27 LAB — APTT: aPTT: 29 s (ref 24–36)

## 2023-09-27 LAB — COMPREHENSIVE METABOLIC PANEL WITH GFR
ALT: 23 U/L (ref 0–44)
AST: 25 U/L (ref 15–41)
Albumin: 3.9 g/dL (ref 3.5–5.0)
Alkaline Phosphatase: 66 U/L (ref 38–126)
Anion gap: 7 (ref 5–15)
BUN: 21 mg/dL (ref 8–23)
CO2: 23 mmol/L (ref 22–32)
Calcium: 8.5 mg/dL — ABNORMAL LOW (ref 8.9–10.3)
Chloride: 99 mmol/L (ref 98–111)
Creatinine, Ser: 0.95 mg/dL (ref 0.44–1.00)
GFR, Estimated: >60 mL/min (ref >60)
Glucose, Bld: 100 mg/dL — ABNORMAL HIGH (ref 70–99)
Potassium: 4.2 mmol/L (ref 3.5–5.1)
Sodium: 129 mmol/L — ABNORMAL LOW (ref 135–145)
Total Bilirubin: 0.5 mg/dL (ref 0.0–1.2)
Total Protein: 6.3 g/dL — ABNORMAL LOW (ref 6.5–8.1)

## 2023-09-27 LAB — CBC
HCT: 33.9 % — ABNORMAL LOW (ref 36.0–46.0)
Hemoglobin: 11.5 g/dL — ABNORMAL LOW (ref 12.0–15.0)
MCH: 35.2 pg — ABNORMAL HIGH (ref 26.0–34.0)
MCHC: 33.9 g/dL (ref 30.0–36.0)
MCV: 103.7 fL — ABNORMAL HIGH (ref 80.0–100.0)
Platelets: 200 10*3/uL (ref 150–400)
RBC: 3.27 MIL/uL — ABNORMAL LOW (ref 3.87–5.11)
RDW: 12.2 % (ref 11.5–15.5)
WBC: 3.6 10*3/uL — ABNORMAL LOW (ref 4.0–10.5)
nRBC: 0 % (ref 0.0–0.2)

## 2023-09-27 LAB — PROTIME-INR
INR: 1 (ref 0.8–1.2)
Prothrombin Time: 13.8 s (ref 11.4–15.2)

## 2023-09-27 LAB — CBG MONITORING, ED: Glucose-Capillary: 93 mg/dL (ref 70–99)

## 2023-09-27 LAB — ETHANOL: Alcohol, Ethyl (B): <10 mg/dL (ref <10)

## 2023-09-27 MED ORDER — TENECTEPLASE FOR STROKE
0.2500 mg/kg | PACK | Freq: Once | INTRAVENOUS | Status: AC
  Administered 2023-09-27: 17 mg via INTRAVENOUS

## 2023-09-27 MED ORDER — LEVETIRACETAM IN NACL 1000 MG/100ML IV SOLN
1000.0000 mg | Freq: Once | INTRAVENOUS | Status: AC
  Administered 2023-09-27: 1000 mg via INTRAVENOUS
  Filled 2023-09-27: qty 100

## 2023-09-27 MED ORDER — SODIUM CHLORIDE 0.9 % IV SOLN
150.0000 mg | Freq: Once | INTRAVENOUS | Status: AC
  Administered 2023-09-27: 150 mg via INTRAVENOUS
  Filled 2023-09-27: qty 15

## 2023-09-27 MED ORDER — SODIUM CHLORIDE 0.9% FLUSH
3.0000 mL | Freq: Once | INTRAVENOUS | Status: AC
  Administered 2023-09-27: 3 mL via INTRAVENOUS

## 2023-09-27 MED ORDER — IOHEXOL 350 MG/ML SOLN
75.0000 mL | Freq: Once | INTRAVENOUS | Status: AC | PRN
  Administered 2023-09-27: 75 mL via INTRAVENOUS

## 2023-09-27 MED ORDER — LORAZEPAM 2 MG/ML IJ SOLN
2.0000 mg | Freq: Once | INTRAMUSCULAR | Status: AC
  Administered 2023-09-27: 2 mg via INTRAVENOUS
  Filled 2023-09-27: qty 1

## 2023-09-27 MED ORDER — PHENYTOIN SODIUM 50 MG/ML IJ SOLN
100.0000 mg | Freq: Once | INTRAMUSCULAR | Status: AC
  Administered 2023-09-28: 100 mg via INTRAVENOUS
  Filled 2023-09-27 (×2): qty 2

## 2023-09-27 MED ORDER — SODIUM CHLORIDE 0.9 % IV BOLUS
1000.0000 mL | Freq: Once | INTRAVENOUS | Status: AC
  Administered 2023-09-27: 1000 mL via INTRAVENOUS

## 2023-09-27 MED ORDER — LORAZEPAM 2 MG/ML IJ SOLN: 0.2500 mg | Freq: Once | INTRAMUSCULAR | Status: DC

## 2023-09-27 NOTE — ED Triage Notes (Signed)
 Pt arrives from [facility] [place] for an unwitnessed seizure. Hx of epilepsy and dementia but normally ANO. R sided weakness is from seizure after 3 months ago. LVO was 3. LKW was 1555.

## 2023-09-27 NOTE — ED Notes (Signed)
 Dr Alejo Amsler notified and aware of pt being really anxious and repeatedly stating she needs to pee. Pt had wet the bed and kept stating she needed to pee.

## 2023-09-27 NOTE — ED Notes (Signed)
 Pt urinated twice more, linen & gown changed. Fall precautions in place, ED tech Alexis at bedside to reorient pt to surroundings.

## 2023-09-27 NOTE — ED Notes (Signed)
 Pt diaphoretic & anxious asking to pee while cleaning up urine soaked bed linens. Pt denies any pain at this time.

## 2023-09-27 NOTE — ED Notes (Signed)
 Pt confused asking to go outside & smoke. Pt has urinated 3 times in last hour, bed linens & gown changed each time. Fall pad in place on stretcher, call bell in reach, stretcher locked in lowest position.

## 2023-09-27 NOTE — Consult Note (Addendum)
 [facility] Telemedicine Consult   Requesting Provider: Dr. Alejo Amsler  Chief Complaint: AMS  HPI:  68 year old female with extensive history of epilepsy, but also hx of stroke, COPD, hyperlipidemia, neuropathy, OSA, dementia presented as a code stroke.  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  Even with such treatment, patient still have breakthrough seizures.  Per ED RN received information from facility ([facility]), pt at baseline awake alert orientated, knowing the place, her age and DOB, carries normal conversation. Per her niece over the phone, she has breakthrough seizures but usually quickly coming out of seizure, not lasting long. She was recently diagnosed with early stage of Alzheimer disease, short memory loss but long-term memory preserved.  She had a recent right arm fracture, and use more of her left hand for activities. Per niece, pt had stroke several years ago, causing some facial droop but she can not remember which side.   She was seem at her baseline around 1550, however, when she was seen again at 1555, she was not able to talk, with right facial droop and right arm weakness. Assumed to have possible seizure but no witnessed convulsion at that time. She was transferred to [facility] ED for evaluation. CT no acute finding. Before and after CT, pt still not able to tell her name, not answer any questions but able to say "I don't know". Still has right shoulder difficulty lifting up, probably from fracture. Moving all extremities. Mild right facial droop. Discussed with niece, she stated that was not her normal after seizure, never altered for that long time. I discussed with her about TNK benefit, risk and alternative. She is willing to proceed. TNK given. CTA head and neck no LVO.   LKW: 1550 tpa given?: Yes IR Thrombectomy? No, no LVO Modified Rankin Scale: 3-Moderate  disability-requires help but walks WITHOUT assistance   Exam: Vitals:   09/27/23 1743 09/27/23 1750  BP:  117/76  Pulse:  62  Resp: 13 11  Temp: 98.6 F (37 C)   SpO2:  99%     Temp:  [98.6 F (37 C)] 98.6 F (37 C) (04/18 1743) Pulse Rate:  [61-64] 62 (04/18 1750) Resp:  [11-23] 11 (04/18 1750) BP: (117-163)/(67-76) 117/76 (04/18 1750) SpO2:  [95 %-99 %] 99 % (04/18 1750) Weight:  [69.3 kg] 69.3 kg (04/18 1700)  General - Well nourished, well developed, in no apparent distress.  Ophthalmologic - fundi not visualized due to noncooperation.  Cardiovascular - Regular rhythm and rate.  Neuro - awake, alert, eyes open, not answering questions, but able to say "I don't know" to all questions. Mild dysarthria but not naming or repeat. No gaze palsy but cooperative with tracking, not consistently blinking to visual threat. Mild right facial droop. Tongue protrusion not cooperative. No drift on the LUE but RUE proximal weakness with drift at least partially from right arm fracture. Sensation and coordination not cooperative, gait not tested.    NIH Stroke Scale  Level Of Consciousness 0=Alert; keenly responsive 1=Arouse to minor stimulation 2=Requires repeated stimulation to arouse or movements to pain 3=postures or unresponsive 0  LOC Questions to Month and Age 35=Answers both questions correctly 1=Answers one question correctly or dysarthria/intubated/trauma/language barrier 2=Answers neither question correctly or aphasia 2  LOC Commands      -Open/Close eyes     -Open/close grip     -Pantomime commands if communication barrier 0=Performs both tasks correctly 1=Performs one task  correctly 2=Performs neighter task correctly 1  Best Gaze     -Only assess horizontal gaze 0=Normal 1=Partial gaze palsy 2=Forced deviation, or total gaze paresis 0  Visual 0=No visual loss 1=Partial hemianopia 2=Complete hemianopia 3=Bilateral hemianopia (blind including cortical blindness) 3   Facial Palsy     -Use grimace if obtunded 0=Normal symmetrical movement 1=Minor paralysis (asymmetry) 2=Partial paralysis (lower face) 3=Complete paralysis (upper and lower face) 1  Motor  0=No drift for 10/5 seconds 1=Drift, but does not hit bed 2=Some antigravity effort, hits  bed 3=No effort against gravity, limb falls 4=No movement 0=Amputation/joint fusion Right Arm 1     Leg 0    Left Arm 0     Leg 0  Limb Ataxia     - FNT/HTS 0=Absent or does not understand or paralyzed or amputation/joint fusion 1=Present in one limb 2=Present in two limbs 0  Sensory 0=Normal 1=Mild to moderate sensory loss 2=Severe to total sensory loss or coma/unresponsive 2  Best Language 0=No aphasia, normal 1=Mild to moderate aphasia 2=Severe aphasia 3=Mute, global aphasia, or coma/unresponsive 2  Dysarthria 0=Normal 1=Mild to moderate 2=Severe, unintelligible or mute/anarthric 0=intubated/unable to test 1  Extinction/Neglect 0=No abnormality 1=visual/tactile/auditory/spatia/personal inattention/Extinction to bilateral simultaneous stimulation 2=Profound neglect/extinction more than 1 modality  2  Total   15      Imaging Reviewed:  CT ANGIO HEAD NECK W WO CM (CODE STROKE) Result Date: 09/27/2023 CLINICAL DATA:  Neuro deficit, concern for stroke. EXAM: CT ANGIOGRAPHY HEAD AND NECK WITH AND WITHOUT CONTRAST TECHNIQUE: Multidetector CT imaging of the head and neck was performed using the standard protocol during bolus administration of intravenous contrast. Multiplanar CT image reconstructions and MIPs were obtained to evaluate the vascular anatomy. Carotid stenosis measurements (when applicable) are obtained utilizing NASCET criteria, using the distal internal carotid diameter as the denominator. RADIATION DOSE REDUCTION: This exam was performed according to the departmental dose-optimization program which includes automated exposure control, adjustment of the mA and/or kV according to patient  size and/or use of iterative reconstruction technique. CONTRAST:  75mL OMNIPAQUE  IOHEXOL  350 MG/ML SOLN COMPARISON:  Same day CT head. FINDINGS: CTA NECK FINDINGS Aortic arch: Four vessel configuration of the aortic arch. Imaged portion shows no evidence of aneurysm or dissection. Mild atherosclerosis of the aortic arch. No significant stenosis of the major arch vessel origins. Pulmonary arteries: As permitted by contrast timing, there are no filling defects in the visualized pulmonary arteries. Subclavian arteries: Patent bilaterally. Prominent atherosclerotic plaque involving the proximal right subclavian artery along with focal tortuosity of the vessel resulting in moderate stenosis. Mild atherosclerosis at the left subclavian artery origin without significant stenosis. Right carotid system: Patent from the origin to the skull base. Tortuosity of the proximal common carotid artery. Mild atherosclerosis at the carotid bifurcation without hemodynamically significant stenosis. Mild tortuosity of the mid cervical ICA. No evidence of dissection. Left carotid system: Patent from the origin to the skull base. Mild atherosclerosis at the carotid bifurcation without hemodynamically significant stenosis. Retropharyngeal course of the proximal cervical ICA. No evidence of dissection. Vertebral arteries: Codominant. Direct origin of the left vertebral artery on the aortic arch. The vertebral arteries are patent from the origins to the vertebrobasilar confluence. Mild tortuosity of the right V1 segment. No evidence of dissection. Distal tapering of the left V4 segment with mild stenosis just proximal to the vertebrobasilar confluence. Skeleton: No acute or aggressive finding. Postsurgical changes of the right temporal calvarium. Chronic deformity of the right zygomatic arch. Degenerative changes of the  cervical spine with significant disc space narrowing at multiple levels. Other neck: The visualized airway is patent. No  cervical lymphadenopathy. Upper chest: Visualized lung apices are clear. Review of the MIP images confirms the above findings CTA HEAD FINDINGS ANTERIOR CIRCULATION: The intracranial ICAs are patent bilaterally. Mild atherosclerosis of the carotid siphons without significant stenosis. No significant stenosis, proximal occlusion, aneurysm, or vascular malformation. MCAs: The middle cerebral arteries are patent bilaterally. ACAs: The anterior cerebral arteries are patent bilaterally. POSTERIOR CIRCULATION: No significant stenosis, proximal occlusion, aneurysm, or vascular malformation. PCAs: Patent bilaterally.  Fetal origin of the right PCA. Pcomm: Visualized on the right. SCAs: The superior cerebellar arteries are patent bilaterally. Basilar artery: Patent AICAs: Not well visualized. PICAs: Patent Vertebral arteries: The intracranial vertebral arteries are patent. Venous sinuses: As permitted by contrast timing, patent. Anatomic variants: As above. Review of the MIP images confirms the above findings IMPRESSION: No large vessel occlusion. No high-grade stenosis, aneurysm, or vascular malformation of the arteries in the head and neck. Mild atherosclerosis as above. Atherosclerosis involving the bilateral carotid bifurcations without hemodynamically significant stenosis. Focal atherosclerosis along the proximal right subclavian artery resulting in moderate stenosis. Aortic Atherosclerosis (ICD10-I70.0). Electronically Signed   By: Denny Flack M.D.   On: 09/27/2023 17:14   CT HEAD CODE STROKE WO CONTRAST Result Date: 09/27/2023 CLINICAL DATA:  Code stroke. Neuro deficit, acute, stroke suspected. EXAM: CT HEAD WITHOUT CONTRAST TECHNIQUE: Contiguous axial images were obtained from the base of the skull through the vertex without intravenous contrast. RADIATION DOSE REDUCTION: This exam was performed according to the departmental dose-optimization program which includes automated exposure control, adjustment of the  mA and/or kV according to patient size and/or use of iterative reconstruction technique. COMPARISON:  Head CT 07/27/2023. FINDINGS: Brain: No acute hemorrhage. Unchanged encephalomalacia in the anterior right temporal lobe, likely postoperative. Gray-white differentiation is otherwise preserved. No hydrocephalus or extra-axial collection. No mass effect or midline shift. Vascular: No hyperdense vessel or unexpected calcification. Skull: Prior right pterional craniotomy. Sinuses/Orbits: No acute finding. Other: None. ASPECTS [facility] Stroke Program Early CT Score) - Ganglionic level infarction (caudate, lentiform nuclei, internal capsule, insula, M1-M3 cortex): 7 - Supraganglionic infarction (M4-M6 cortex): 3 Total score (0-10 with 10 being normal): 10 IMPRESSION: 1. No acute intracranial hemorrhage or evidence of acute large vessel territory infarct. ASPECT score is 10. 2. Unchanged encephalomalacia in the anterior right temporal lobe, likely postoperative. Code stroke imaging results were communicated on 09/27/2023 at 4:50 pm to provider Dr. Alejo Amsler via telephone, who verbally acknowledged these results. Electronically Signed   By: Audra Blend M.D.   On: 09/27/2023 16:52     Labs reviewed in epic and pertinent values follow: Platelet 200  Assessment:  68 year old female with extensive history of epilepsy, but also hx of stroke, COPD, hyperlipidemia, neuropathy, OSA, dementia presented with AMS with speech difficulty and not answering questions. LSW 1550. Exam concerning for aphasia and right facial droop and right arm drift, but pt did have some mild facial droop from previous stroke and right arm fracture per niece. Symptoms could be stroke vs. Seizure with postictal. Pt within TNK window and no contraindications, discussed with niece, she said that pt usually quickly coming out of seizure, not lasting this long. I discussed with her about TNK benefit, risk and alternative. She is willing to proceed.  TNK given. CTA head and neck no LVO. She will need LTM EEG given extensive hx of epilepsy and on multiple seizure meds, will transfer to [facility] for  EEG monitoring.   Recommendations:  Transfer to [facility] ICU for post TNK care [facility] for [facility] given extensive epilepsy history.  Check blood pressure and NIHSS every 15 min for 2 h, then every 30 min for 6 h, and finally every hour for 16 h BP goal < 180/105 CT or MRI brain 24 hours post thrombolytics Stat CT head without contrast if acute neuro changes Can give home keppra  and dilantin  as IV before swallow screen. Once pass swallow, resume home seizure medications.  NPO until swallowing screen performed and passed No antiplatelet agents or anticoagulants (including heparin for DVT prophylaxis) in first 24 hours No Foley catheter, nasogastric tube, arterial catheter or central venous catheter for 24 hours, unless absolutely necessary Telemetry Euglycemia  Avoid hyperthermia, PRN acetaminophen  DVT prophylaxis with SCDs Discussed with Dr. Alejo Amsler ED physician Will follow   Consult Participants: stroke response RN, pt, me, bedside RN Location of the provider: [facility] Location of the patient: [facility]  Time Code Stroke Page received:  1624 Time neurologist arrived:  1626 Time NIHSS completed: 1638    This consult was provided via telemedicine with 2-way video and audio communication. The patient/family was informed that care would be provided in this way and agreed to receive care in this manner.   This patient is receiving care for possible acute neurological changes. There was 60 minutes of care by this provider at the time of service, including time for direct evaluation via telemedicine, review of medical records, imaging studies and discussion of findings with providers, the patient and/or family.  Consuelo Denmark, MD PhD Stroke Neurology 09/27/2023 6:08 PM  This patient is critically ill due to acute stroke like symptoms,  seizure like activity, s/p TNK and at significant risk of neurological worsening, death form bleeding from TNK, status epilepticus. This patient's care requires constant monitoring of vital signs, hemodynamics, respiratory and cardiac monitoring, review of multiple databases, neurological assessment, discussion with family, other specialists and medical decision making of high complexity. I spent 60 minutes of neurocritical care time in the care of this patient.

## 2023-09-27 NOTE — ED Provider Notes (Signed)
 [facility] Provider Note   Event Date/Time   First MD Initiated Contact with Patient 09/27/23 1638     (approximate) History  Code Stroke  HPI Marcia Brown is a 68 y.o. female with significant past medical history of temporal lobe epilepsy, COPD, and TBI who presents from a long-term care facility via EMS as a stroke alert due to right-sided facial droop and aphasia.  Patient was reportedly found this way in her room today by staff.  Patient was not reported to have any witnessed seizure activity prior to her change in mental status.  Staff states that patient had difficulty word finding after she was found ROS: Patient currently denies any vision changes, tinnitus, difficulty speaking, facial droop, sore throat, chest pain, shortness of breath, abdominal pain, nausea/vomiting/diarrhea, dysuria, or weakness/numbness/paresthesias in any extremity   Physical Exam  Triage Vital Signs: ED Triage Vitals [09/27/23 1700]  Encounter Vitals Group     BP      Systolic BP Percentile      Diastolic BP Percentile      Pulse      Resp      Temp      Temp src      SpO2      Weight 152 lb 12.5 oz (69.3 kg)     Height      Head Circumference      Peak Flow      Pain Score      Pain Loc      Pain Education      Exclude from Growth Chart    Most recent vital signs: Vitals:   09/27/23 1810 09/27/23 1820  BP: 131/75 (!) 139/105  Pulse: 65 67  Resp: 18 19  Temp:    SpO2: 100% 98%   General: Awake, cooperative CV:  Good peripheral perfusion.  Resp:  Normal effort.  Abd:  No distention.  Other:  Airway patent. Resting comfortably in no acute distress ED Results / Procedures / Treatments  Labs (all labs ordered are listed, but only abnormal results are displayed) Labs Reviewed  CBC - Abnormal; Notable for the following components:      Result Value   WBC 3.6 (*)    RBC 3.27 (*)    Hemoglobin 11.5 (*)    HCT 33.9 (*)    MCV 103.7 (*)    MCH 35.2 (*)    All  other components within normal limits  DIFFERENTIAL - Abnormal; Notable for the following components:   Neutro Abs 1.5 (*)    All other components within normal limits  COMPREHENSIVE METABOLIC PANEL WITH GFR - Abnormal; Notable for the following components:   Sodium 129 (*)    Glucose, Bld 100 (*)    Calcium  8.5 (*)    Total Protein 6.3 (*)    All other components within normal limits  PROTIME-INR  APTT  ETHANOL  I-STAT CREATININE, ED  CBG MONITORING, ED   EKG ED ECG REPORT I, Charleen Conn, the attending physician, personally viewed and interpreted this ECG. Date: 09/27/2023 EKG Time: 1719 Rate: 60 Rhythm: normal sinus rhythm QRS Axis: normal Intervals: normal ST/T Wave abnormalities: normal Narrative Interpretation: no evidence of acute ischemia RADIOLOGY ED MD interpretation: CT of the head without contrast interpreted by me shows no evidence of acute abnormalities including no intracerebral hemorrhage, obvious masses, or significant edema -Agree with radiology assessment Official radiology report(s): CT ANGIO HEAD NECK W WO CM (CODE STROKE) Result Date: 09/27/2023  CLINICAL DATA:  Neuro deficit, concern for stroke. EXAM: CT ANGIOGRAPHY HEAD AND NECK WITH AND WITHOUT CONTRAST TECHNIQUE: Multidetector CT imaging of the head and neck was performed using the standard protocol during bolus administration of intravenous contrast. Multiplanar CT image reconstructions and MIPs were obtained to evaluate the vascular anatomy. Carotid stenosis measurements (when applicable) are obtained utilizing NASCET criteria, using the distal internal carotid diameter as the denominator. RADIATION DOSE REDUCTION: This exam was performed according to the departmental dose-optimization program which includes automated exposure control, adjustment of the mA and/or kV according to patient size and/or use of iterative reconstruction technique. CONTRAST:  75mL OMNIPAQUE  IOHEXOL  350 MG/ML SOLN COMPARISON:  Same  day CT head. FINDINGS: CTA NECK FINDINGS Aortic arch: Four vessel configuration of the aortic arch. Imaged portion shows no evidence of aneurysm or dissection. Mild atherosclerosis of the aortic arch. No significant stenosis of the major arch vessel origins. Pulmonary arteries: As permitted by contrast timing, there are no filling defects in the visualized pulmonary arteries. Subclavian arteries: Patent bilaterally. Prominent atherosclerotic plaque involving the proximal right subclavian artery along with focal tortuosity of the vessel resulting in moderate stenosis. Mild atherosclerosis at the left subclavian artery origin without significant stenosis. Right carotid system: Patent from the origin to the skull base. Tortuosity of the proximal common carotid artery. Mild atherosclerosis at the carotid bifurcation without hemodynamically significant stenosis. Mild tortuosity of the mid cervical ICA. No evidence of dissection. Left carotid system: Patent from the origin to the skull base. Mild atherosclerosis at the carotid bifurcation without hemodynamically significant stenosis. Retropharyngeal course of the proximal cervical ICA. No evidence of dissection. Vertebral arteries: Codominant. Direct origin of the left vertebral artery on the aortic arch. The vertebral arteries are patent from the origins to the vertebrobasilar confluence. Mild tortuosity of the right V1 segment. No evidence of dissection. Distal tapering of the left V4 segment with mild stenosis just proximal to the vertebrobasilar confluence. Skeleton: No acute or aggressive finding. Postsurgical changes of the right temporal calvarium. Chronic deformity of the right zygomatic arch. Degenerative changes of the cervical spine with significant disc space narrowing at multiple levels. Other neck: The visualized airway is patent. No cervical lymphadenopathy. Upper chest: Visualized lung apices are clear. Review of the MIP images confirms the above findings  CTA HEAD FINDINGS ANTERIOR CIRCULATION: The intracranial ICAs are patent bilaterally. Mild atherosclerosis of the carotid siphons without significant stenosis. No significant stenosis, proximal occlusion, aneurysm, or vascular malformation. MCAs: The middle cerebral arteries are patent bilaterally. ACAs: The anterior cerebral arteries are patent bilaterally. POSTERIOR CIRCULATION: No significant stenosis, proximal occlusion, aneurysm, or vascular malformation. PCAs: Patent bilaterally.  Fetal origin of the right PCA. Pcomm: Visualized on the right. SCAs: The superior cerebellar arteries are patent bilaterally. Basilar artery: Patent AICAs: Not well visualized. PICAs: Patent Vertebral arteries: The intracranial vertebral arteries are patent. Venous sinuses: As permitted by contrast timing, patent. Anatomic variants: As above. Review of the MIP images confirms the above findings IMPRESSION: No large vessel occlusion. No high-grade stenosis, aneurysm, or vascular malformation of the arteries in the head and neck. Mild atherosclerosis as above. Atherosclerosis involving the bilateral carotid bifurcations without hemodynamically significant stenosis. Focal atherosclerosis along the proximal right subclavian artery resulting in moderate stenosis. Aortic Atherosclerosis (ICD10-I70.0). Electronically Signed   By: Denny Flack M.D.   On: 09/27/2023 17:14   CT HEAD CODE STROKE WO CONTRAST Result Date: 09/27/2023 CLINICAL DATA:  Code stroke. Neuro deficit, acute, stroke suspected. EXAM: CT HEAD WITHOUT CONTRAST  TECHNIQUE: Contiguous axial images were obtained from the base of the skull through the vertex without intravenous contrast. RADIATION DOSE REDUCTION: This exam was performed according to the departmental dose-optimization program which includes automated exposure control, adjustment of the mA and/or kV according to patient size and/or use of iterative reconstruction technique. COMPARISON:  Head CT 07/27/2023.  FINDINGS: Brain: No acute hemorrhage. Unchanged encephalomalacia in the anterior right temporal lobe, likely postoperative. Gray-white differentiation is otherwise preserved. No hydrocephalus or extra-axial collection. No mass effect or midline shift. Vascular: No hyperdense vessel or unexpected calcification. Skull: Prior right pterional craniotomy. Sinuses/Orbits: No acute finding. Other: None. ASPECTS [facility] Stroke Program Early CT Score) - Ganglionic level infarction (caudate, lentiform nuclei, internal capsule, insula, M1-M3 cortex): 7 - Supraganglionic infarction (M4-M6 cortex): 3 Total score (0-10 with 10 being normal): 10 IMPRESSION: 1. No acute intracranial hemorrhage or evidence of acute large vessel territory infarct. ASPECT score is 10. 2. Unchanged encephalomalacia in the anterior right temporal lobe, likely postoperative. Code stroke imaging results were communicated on 09/27/2023 at 4:50 pm to provider Dr. Alejo Amsler via telephone, who verbally acknowledged these results. Electronically Signed   By: Audra Blend M.D.   On: 09/27/2023 16:52   PROCEDURES: Critical Care performed: Yes, see critical care procedure note(s) .1-3 Lead EKG Interpretation  Performed by: Charleen Conn, MD Authorized by: Charleen Conn, MD     Interpretation: normal     ECG rate:  71   ECG rate assessment: normal     Rhythm: sinus rhythm     Ectopy: none     Conduction: normal   CRITICAL CARE Performed by: Yeily Link K Noura Purpura  Total critical care time: 33 minutes  Critical care time was exclusive of separately billable procedures and treating other patients.  Critical care was necessary to treat or prevent imminent or life-threatening deterioration.  Critical care was time spent personally by me on the following activities: development of treatment plan with patient and/or surrogate as well as nursing, discussions with consultants, evaluation of patient's response to treatment, examination of patient,  obtaining history from patient or surrogate, ordering and performing treatments and interventions, ordering and review of laboratory studies, ordering and review of radiographic studies, pulse oximetry and re-evaluation of patient's condition.  MEDICATIONS ORDERED IN ED: Medications  sodium chloride  flush (NS) 0.9 % injection 3 mL (3 mLs Intravenous Given 09/27/23 1752)  iohexol  (OMNIPAQUE ) 350 MG/ML injection 75 mL (75 mLs Intravenous Contrast Given 09/27/23 1654)  tenecteplase  (TNKASE ) injection for Stroke 17 mg (17 mg Intravenous Given 09/27/23 1704)  sodium chloride  0.9 % bolus 1,000 mL (1,000 mLs Intravenous New Bag/Given 09/27/23 1753)   IMPRESSION / MDM / ASSESSMENT AND PLAN / ED COURSE  I reviewed the triage vital signs and the nursing notes.                             The patient is on the cardiac monitor to evaluate for evidence of arrhythmia and/or significant heart rate changes. Patient's presentation is most consistent with acute presentation with potential threat to life or bodily function. Stroke alert PMH risk factors: Epilepsy, COPD, dementia, hyperlipidemia, TBI Neurologic Deficits: Aphasia, right-sided facial droop, confusion Last known Well Time: 1555 Given History and Exam I have lower suspicion for infectious etiology, neurologic changes secondary to toxicologic ingestion, seizure, complex migraine. Presentation concerning for possible stroke requiring workup.  Workup: Labs: POC glucose, CBC, BMP, LFTs, Troponin, PT/INR, PTT, Type and Screen  Other Diagnostics: ECG, CXR, non-contrast head CT followed by CTA brain and neck (to r/o large vessel occlusion amenable to thrombectomy) Interventions: Patient low on NIH scale and out of the window for tpa.  Consult: Neurology. Discussed with Dr. Christiane Cowing regarding patient's neurological symptoms and last well-known time and eligibility for TPA criteria.  Patient does meet criteria for tPA administration at this time Disposition:  Admission to ICU at [facility]   FINAL CLINICAL IMPRESSION(S) / ED DIAGNOSES   Final diagnoses:  Aphasia  Facial droop  Altered mental status, unspecified altered mental status type   Rx / DC Orders   ED Discharge Orders     None      Note:  This document was prepared using Dragon voice recognition software and may include unintentional dictation errors.   Charleen Conn, MD 09/27/23 [phone]

## 2023-09-27 NOTE — ED Notes (Signed)
 Waiting on room assignment @ [facility]

## 2023-09-27 NOTE — Consult Note (Signed)
 CODE STROKE- PHARMACY COMMUNICATION  Time CODE STROKE called/page received: 1622  Time response to CODE STROKE was made (in person or via phone): 1630  Time Stroke Kit retrieved from Pyxis (only if needed): 1630 - TNK 3.4 mL given   Name of Provider/Nurse contacted: Dr. 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 Authorizing Provider  acetaminophen  (TYLENOL ) 325 MG tablet Take 650 mg by mouth in the morning, at noon, and at bedtime. Take 650 mg by mouth 3 (three) times daily GIVEN WITH TRAMADOL  0800/1400/2000    [provider]  albuterol  (PROVENTIL  HFA;VENTOLIN  HFA) 108 (90 BASE) MCG/ACT inhaler Inhale 2 puffs into the lungs every 4 (four) hours as needed for wheezing or shortness of breath.    [provider]  alum & mag hydroxide-simeth (MAALOX/MYLANTA) 200-200-20 MG/5ML suspension Take 30 mLs by mouth every 6 (six) hours as needed for indigestion or heartburn.    [provider]  azelastine (OPTIVAR) 0.05 % ophthalmic solution Place 1 drop into both eyes 2 (two) times daily. 04/18/21   [provider]  budesonide-formoterol  (SYMBICORT) 160-4.5 MCG/ACT inhaler Inhale 2 puffs into the lungs 2 (two) times daily.    [provider]  carbamazepine  (TEGRETOL ) 100 MG chewable tablet Chew 150 mg by mouth in the morning and at bedtime. 10/31/22   [provider]  celecoxib  (CELEBREX ) 100 MG capsule Take 100 mg by mouth 2 (two) times daily. 04/21/21   [provider]   cholecalciferol  (VITAMIN D ) 400 UNITS TABS tablet Take 400 Units by mouth daily.    [provider]  clopidogrel  (PLAVIX ) 75 MG tablet Take 75 mg by mouth daily. 06/19/19   [provider]  cyanocobalamin  1000 MCG tablet Take 1 tablet (1,000 mcg total) by mouth daily. 06/11/23   Darci Pore, MD  diphenhydrAMINE  (BENADRYL ) 25 MG tablet Take 25 mg by mouth every 6 (six) hours as needed for allergies (allergic reaction).    [provider]  docusate sodium  (COLACE) 50 MG capsule Take 50 mg by mouth daily.    [provider]  DULoxetine  HCl 40 MG CPEP Take 40 mg by mouth 2 (two) times daily.    [provider]  gabapentin  (NEURONTIN ) 100 MG capsule Take 200 mg by mouth at bedtime.    [provider]  guaiFENesin  (ROBITUSSIN) 100 MG/5ML liquid Take 15 mLs by mouth every 6 (six) hours as needed for cough.    [provider]  hydrocortisone cream 1 % Apply 1 Application topically daily as needed (skin rash/ irritation).    [provider]  lamoTRIgine  (LAMICTAL ) 100 MG tablet Take 2 tablets (200 mg total) by mouth daily. 06/10/23 09/08/23  Darci Pore, MD  lamoTRIgine  (LAMICTAL ) 100 MG tablet Take 3 tablets (300 mg total) by mouth at bedtime. 06/10/23 09/08/23  Darci Pore, MD  levETIRAcetam  (KEPPRA ) 500 MG tablet  Take 1-2 tablets (500-1,000 mg total) by mouth 2 (two) times daily. Take one tablet (500) by mouth daily in the morning and two tablets (1000 mg) in the evening 06/10/23 09/08/23  Darci Pore, MD  loperamide  (IMODIUM ) 2 MG capsule Take 4 mg by mouth as needed for diarrhea or loose stools.    [provider]  loratadine  (CLARITIN ) 10 MG tablet Take 10 mg by mouth daily.    [provider]  losartan  (COZAAR ) 50 MG tablet Take 50 mg by mouth daily.    [provider]  magnesium  hydroxide (MILK OF MAGNESIA) 400 MG/5ML suspension Take 30 mLs by mouth daily as needed for  mild constipation or moderate constipation.    [provider]  Multiple Vitamin (MULTIVITAMIN WITH MINERALS) TABS tablet Take 1 tablet by mouth daily.    [provider]  pantoprazole  (PROTONIX ) 20 MG tablet Take 20 mg by mouth at bedtime. 10/31/22   [provider]  phenytoin  (DILANTIN ) 100 MG ER capsule Take 100 mg by mouth 2 (two) times daily.     [provider]  QUEtiapine  (SEROQUEL ) 25 MG tablet Take 25 mg by mouth at bedtime. 06/19/19   [provider]  rosuvastatin  (CRESTOR ) 5 MG tablet Take 5 mg by mouth daily at 8 pm.    [provider]  sodium chloride  (OCEAN) 0.65 % nasal spray Place 2 sprays into the nose 2 (two) times daily as needed (dry nose/epitaxis). 2 Spray(s) Both Nares Twice Daily PRN for dry nose/epistaxis 08/08/22   [provider]  sucralfate  (CARAFATE ) 1 g tablet Take 1 g by mouth 2 (two) times daily. Take on empty stomach    [provider]  VIMPAT  150 MG TABS Take 1 tablet by mouth 2 (two) times daily. 06/03/19   [provider]   Evonnie Nieves, PharmD Pharmacy Resident  09/27/2023 5:09 PM

## 2023-09-27 NOTE — Progress Notes (Signed)
   09/27/23 1800  Spiritual Encounters  Type of Visit Initial  Care provided to: Patient  Conversation partners present during encounter Nurse  Reason for visit Code  OnCall Visit Yes   Chaplain received a CODE STROKE and attempted to see the patient twice but patient wasn't in the room.  No family present.  Chaplain visited with patient on the third attempt and she shared she felt the urge to urinate.  Chaplain shared with the Nurse.  Chaplain offered a compassionate presence to patient and reflective listening.    Rev. Rana M. Nicholaus, M.Div. Chaplain Resident [facility]

## 2023-09-27 NOTE — ED Notes (Signed)
 Dr Alejo Amsler notified of pt being really sweaty and anxious.

## 2023-09-27 NOTE — ED Notes (Signed)
 NIHSS charted at 1700 was done by Edwina Gram, RN but charted by this RN

## 2023-09-27 NOTE — Progress Notes (Signed)
 1624 elert with EMS there, amion sent to [facility] neuro 1626 Dr. Jerri on 1628 registered 1635 down to scan 1638 Edp assigned, Xu calling assisted living facility 1645 Xu calling niece 1658 NCCT relayed to Dr. Jerri 1702 order for tnk in, BP 131/59 1704 tnk in (17mg ) 1709 back from ct 1719 advanced imaging relayed to Dr. Jerri COZIER 1555, right arm weakness, aphasia

## 2023-09-28 ENCOUNTER — Inpatient Hospital Stay (HOSPITAL_COMMUNITY)

## 2023-09-28 ENCOUNTER — Inpatient Hospital Stay (HOSPITAL_COMMUNITY)
Admission: EM | Admit: 2023-09-28 | Discharge: 2023-10-01 | DRG: 101 | Disposition: A | Discharge status: Nursing Home (Includes ICF) | Source: Other Acute Inpatient Hospital | Attending: Neurology | Admitting: Neurology

## 2023-09-28 DIAGNOSIS — Z886 Allergy status to analgesic agent status: Secondary | ICD-10-CM

## 2023-09-28 DIAGNOSIS — Z881 Allergy status to other antibiotic agents status: Secondary | ICD-10-CM

## 2023-09-28 DIAGNOSIS — G4733 Obstructive sleep apnea (adult) (pediatric): Secondary | ICD-10-CM | POA: Diagnosis present

## 2023-09-28 DIAGNOSIS — R1310 Dysphagia, unspecified: Secondary | ICD-10-CM | POA: Diagnosis present

## 2023-09-28 DIAGNOSIS — E7800 Pure hypercholesterolemia, unspecified: Secondary | ICD-10-CM | POA: Diagnosis present

## 2023-09-28 DIAGNOSIS — D649 Anemia, unspecified: Secondary | ICD-10-CM | POA: Diagnosis present

## 2023-09-28 DIAGNOSIS — E871 Hypo-osmolality and hyponatremia: Secondary | ICD-10-CM | POA: Diagnosis present

## 2023-09-28 DIAGNOSIS — Z7902 Long term (current) use of antithrombotics/antiplatelets: Secondary | ICD-10-CM | POA: Diagnosis not present

## 2023-09-28 DIAGNOSIS — H5461 Unqualified visual loss, right eye, normal vision left eye: Secondary | ICD-10-CM

## 2023-09-28 DIAGNOSIS — I69392 Facial weakness following cerebral infarction: Secondary | ICD-10-CM

## 2023-09-28 DIAGNOSIS — R4701 Aphasia: Secondary | ICD-10-CM

## 2023-09-28 DIAGNOSIS — F0393 Unspecified dementia, unspecified severity, with mood disturbance: Secondary | ICD-10-CM | POA: Diagnosis present

## 2023-09-28 DIAGNOSIS — I639 Cerebral infarction, unspecified: Principal | ICD-10-CM | POA: Diagnosis present

## 2023-09-28 DIAGNOSIS — R29810 Facial weakness: Secondary | ICD-10-CM | POA: Diagnosis not present

## 2023-09-28 DIAGNOSIS — G40909 Epilepsy, unspecified, not intractable, without status epilepticus: Secondary | ICD-10-CM | POA: Diagnosis not present

## 2023-09-28 DIAGNOSIS — G9389 Other specified disorders of brain: Secondary | ICD-10-CM | POA: Diagnosis present

## 2023-09-28 DIAGNOSIS — G40109 Localization-related (focal) (partial) symptomatic epilepsy and epileptic syndromes with simple partial seizures, not intractable, without status epilepticus: Secondary | ICD-10-CM | POA: Diagnosis present

## 2023-09-28 DIAGNOSIS — R410 Disorientation, unspecified: Secondary | ICD-10-CM

## 2023-09-28 DIAGNOSIS — F32A Depression, unspecified: Secondary | ICD-10-CM | POA: Diagnosis present

## 2023-09-28 DIAGNOSIS — R2990 Unspecified symptoms and signs involving the nervous system: Secondary | ICD-10-CM | POA: Diagnosis not present

## 2023-09-28 DIAGNOSIS — J4489 Other specified chronic obstructive pulmonary disease: Secondary | ICD-10-CM | POA: Diagnosis present

## 2023-09-28 DIAGNOSIS — I129 Hypertensive chronic kidney disease with stage 1 through stage 4 chronic kidney disease, or unspecified chronic kidney disease: Secondary | ICD-10-CM | POA: Diagnosis present

## 2023-09-28 DIAGNOSIS — D72819 Decreased white blood cell count, unspecified: Secondary | ICD-10-CM | POA: Diagnosis present

## 2023-09-28 DIAGNOSIS — G629 Polyneuropathy, unspecified: Secondary | ICD-10-CM | POA: Diagnosis present

## 2023-09-28 DIAGNOSIS — Z803 Family history of malignant neoplasm of breast: Secondary | ICD-10-CM

## 2023-09-28 DIAGNOSIS — R569 Unspecified convulsions: Secondary | ICD-10-CM | POA: Diagnosis not present

## 2023-09-28 DIAGNOSIS — R4182 Altered mental status, unspecified: Secondary | ICD-10-CM | POA: Diagnosis not present

## 2023-09-28 DIAGNOSIS — I69391 Dysphagia following cerebral infarction: Secondary | ICD-10-CM

## 2023-09-28 DIAGNOSIS — F0394 Unspecified dementia, unspecified severity, with anxiety: Secondary | ICD-10-CM | POA: Diagnosis present

## 2023-09-28 DIAGNOSIS — Z9282 Status post administration of tPA (rtPA) in a different facility within the last 24 hours prior to admission to current facility: Secondary | ICD-10-CM

## 2023-09-28 DIAGNOSIS — I700 Atherosclerosis of aorta: Secondary | ICD-10-CM

## 2023-09-28 DIAGNOSIS — K219 Gastro-esophageal reflux disease without esophagitis: Secondary | ICD-10-CM | POA: Diagnosis present

## 2023-09-28 DIAGNOSIS — I361 Nonrheumatic tricuspid (valve) insufficiency: Secondary | ICD-10-CM | POA: Diagnosis not present

## 2023-09-28 DIAGNOSIS — I6523 Occlusion and stenosis of bilateral carotid arteries: Secondary | ICD-10-CM | POA: Diagnosis not present

## 2023-09-28 DIAGNOSIS — Z79899 Other long term (current) drug therapy: Secondary | ICD-10-CM | POA: Diagnosis not present

## 2023-09-28 DIAGNOSIS — F17210 Nicotine dependence, cigarettes, uncomplicated: Secondary | ICD-10-CM | POA: Diagnosis present

## 2023-09-28 LAB — HEMOGLOBIN A1C
Hgb A1c MFr Bld: 4.4 % — ABNORMAL LOW (ref 4.8–5.6)
Mean Plasma Glucose: 79.58 mg/dL

## 2023-09-28 LAB — ECHOCARDIOGRAM COMPLETE
AR max vel: 2.54 cm2
AV Area VTI: 2.44 cm2
AV Area mean vel: 2.33 cm2
AV Mean grad: 5 mmHg
AV Peak grad: 8.5 mmHg
Ao pk vel: 1.46 m/s
Area-P 1/2: 4.6 cm2
S' Lateral: 2.5 cm

## 2023-09-28 LAB — MRSA NEXT GEN BY PCR, NASAL: MRSA by PCR Next Gen: NOT DETECTED

## 2023-09-28 MED ORDER — HALOPERIDOL LACTATE 5 MG/ML IJ SOLN
2.0000 mg | Freq: Four times a day (QID) | INTRAMUSCULAR | Status: DC | PRN
  Administered 2023-09-28: 2 mg via INTRAVENOUS
  Filled 2023-09-28: qty 1

## 2023-09-28 MED ORDER — LAMOTRIGINE 100 MG PO TABS
300.0000 mg | ORAL_TABLET | Freq: Every day | ORAL | Status: DC
  Administered 2023-09-28 – 2023-09-30 (×3): 300 mg via ORAL
  Filled 2023-09-28 (×3): qty 3

## 2023-09-28 MED ORDER — LEVETIRACETAM 500 MG PO TABS
500.0000 mg | ORAL_TABLET | Freq: Every morning | ORAL | Status: DC
  Administered 2023-09-28 – 2023-10-01 (×4): 500 mg via ORAL
  Filled 2023-09-28 (×4): qty 1

## 2023-09-28 MED ORDER — ACETAMINOPHEN 325 MG PO TABS: 650.0000 mg | ORAL_TABLET | ORAL | Status: DC | PRN

## 2023-09-28 MED ORDER — LORAZEPAM 2 MG/ML IJ SOLN
2.0000 mg | Freq: Once | INTRAMUSCULAR | Status: AC
  Administered 2023-09-28: 2 mg via INTRAVENOUS

## 2023-09-28 MED ORDER — HALOPERIDOL LACTATE 5 MG/ML IJ SOLN
INTRAMUSCULAR | Status: AC
Start: 2023-09-28 — End: 2023-09-28
  Filled 2023-09-28: qty 1

## 2023-09-28 MED ORDER — ROSUVASTATIN CALCIUM 5 MG PO TABS
5.0000 mg | ORAL_TABLET | Freq: Every day | ORAL | Status: DC
  Administered 2023-09-28 – 2023-09-29 (×2): 5 mg via ORAL
  Filled 2023-09-28 (×2): qty 1

## 2023-09-28 MED ORDER — ORAL CARE MOUTH RINSE: 15.0000 mL | OROMUCOSAL | Status: DC | PRN

## 2023-09-28 MED ORDER — SODIUM CHLORIDE 0.9 % IV SOLN: INTRAVENOUS | Status: AC

## 2023-09-28 MED ORDER — CARBAMAZEPINE 100 MG PO CHEW
150.0000 mg | CHEWABLE_TABLET | Freq: Two times a day (BID) | ORAL | Status: DC
  Administered 2023-09-28 – 2023-10-01 (×7): 150 mg via ORAL
  Filled 2023-09-28 (×8): qty 1.5

## 2023-09-28 MED ORDER — FLUTICASONE FUROATE-VILANTEROL 200-25 MCG/ACT IN AEPB
1.0000 | INHALATION_SPRAY | Freq: Every day | RESPIRATORY_TRACT | Status: DC
  Filled 2023-09-28: qty 28

## 2023-09-28 MED ORDER — CLEVIDIPINE BUTYRATE 0.5 MG/ML IV EMUL: 0.0000 mg/h | INTRAVENOUS | Status: DC

## 2023-09-28 MED ORDER — KETOTIFEN FUMARATE 0.035 % OP SOLN
1.0000 [drp] | Freq: Two times a day (BID) | OPHTHALMIC | Status: DC
  Administered 2023-09-28 – 2023-10-01 (×7): 1 [drp] via OPHTHALMIC
  Filled 2023-09-28: qty 5

## 2023-09-28 MED ORDER — LAMOTRIGINE 100 MG PO TABS
200.0000 mg | ORAL_TABLET | Freq: Every morning | ORAL | Status: DC
  Administered 2023-09-28 – 2023-10-01 (×4): 200 mg via ORAL
  Filled 2023-09-28 (×4): qty 2

## 2023-09-28 MED ORDER — PANTOPRAZOLE SODIUM 40 MG IV SOLR
40.0000 mg | Freq: Every day | INTRAVENOUS | Status: DC
  Administered 2023-09-28 – 2023-09-29 (×2): 40 mg via INTRAVENOUS
  Filled 2023-09-28 (×2): qty 10

## 2023-09-28 MED ORDER — ACETAMINOPHEN 650 MG RE SUPP: 650.0000 mg | RECTAL | Status: DC | PRN

## 2023-09-28 MED ORDER — CHLORHEXIDINE GLUCONATE CLOTH 2 % EX PADS
6.0000 | MEDICATED_PAD | Freq: Every day | CUTANEOUS | Status: DC
  Administered 2023-09-28 – 2023-09-30 (×4): 6 via TOPICAL

## 2023-09-28 MED ORDER — PHENYTOIN SODIUM EXTENDED 100 MG PO CAPS
100.0000 mg | ORAL_CAPSULE | Freq: Two times a day (BID) | ORAL | Status: DC
  Administered 2023-09-28 – 2023-10-01 (×7): 100 mg via ORAL
  Filled 2023-09-28 (×9): qty 1

## 2023-09-28 MED ORDER — HYDRALAZINE HCL 20 MG/ML IJ SOLN: 5.0000 mg | INTRAMUSCULAR | Status: DC | PRN

## 2023-09-28 MED ORDER — STROKE: EARLY STAGES OF RECOVERY BOOK
Freq: Once | Status: AC
  Filled 2023-09-28: qty 1

## 2023-09-28 MED ORDER — LEVETIRACETAM 500 MG PO TABS
1000.0000 mg | ORAL_TABLET | Freq: Every day | ORAL | Status: DC
  Administered 2023-09-28 – 2023-09-30 (×3): 1000 mg via ORAL
  Filled 2023-09-28 (×3): qty 2

## 2023-09-28 MED ORDER — LORAZEPAM 2 MG/ML IJ SOLN
INTRAMUSCULAR | Status: AC
  Filled 2023-09-28: qty 1

## 2023-09-28 MED ORDER — GABAPENTIN 100 MG PO CAPS
100.0000 mg | ORAL_CAPSULE | Freq: Two times a day (BID) | ORAL | Status: DC
  Administered 2023-09-28 – 2023-10-01 (×7): 100 mg via ORAL
  Filled 2023-09-28 (×7): qty 1

## 2023-09-28 MED ORDER — LACOSAMIDE 50 MG PO TABS
150.0000 mg | ORAL_TABLET | Freq: Two times a day (BID) | ORAL | Status: DC
  Administered 2023-09-28 – 2023-10-01 (×6): 150 mg via ORAL
  Filled 2023-09-28 (×6): qty 3

## 2023-09-28 MED ORDER — VITAMIN B-12 1000 MCG PO TABS
1000.0000 ug | ORAL_TABLET | Freq: Every day | ORAL | Status: DC
  Administered 2023-09-28 – 2023-10-01 (×4): 1000 ug via ORAL
  Filled 2023-09-28 (×4): qty 1

## 2023-09-28 MED ORDER — SENNOSIDES-DOCUSATE SODIUM 8.6-50 MG PO TABS: 1.0000 | ORAL_TABLET | Freq: Every evening | ORAL | Status: DC | PRN

## 2023-09-28 MED ORDER — ACETAMINOPHEN 160 MG/5ML PO SOLN: 650.0000 mg | ORAL | Status: DC | PRN

## 2023-09-28 MED ORDER — LABETALOL HCL 5 MG/ML IV SOLN: 5.0000 mg | INTRAVENOUS | Status: DC | PRN

## 2023-09-28 MED ORDER — ALBUTEROL SULFATE (2.5 MG/3ML) 0.083% IN NEBU: 2.5000 mg | INHALATION_SOLUTION | RESPIRATORY_TRACT | Status: DC | PRN

## 2023-09-28 NOTE — Progress Notes (Signed)
 LTM EEG hooked up and running - no initial skin breakdown - push button tested - [facility] monitoring. MRI compatible leads.

## 2023-09-28 NOTE — ED Notes (Signed)
 EMTALA reviewed by this RN. Unable to sign due to patient condition and hx of dementia. No family at bedside to sign consent to transfer.

## 2023-09-28 NOTE — Progress Notes (Signed)
 Called CT at 1500 to schedule a CT scan at 1600-1630 in order to fulfill 24hr post-TNK scan. CT is behind and will call back with available times, this RN gave CT tech number to call. Dr. Janett Medin and Jonette Nestle, NP, notified of the potential delay.  Tiya Schrupp, RN

## 2023-09-28 NOTE — Progress Notes (Addendum)
 STROKE TEAM PROGRESS NOTE    SIGNIFICANT HOSPITAL EVENTS 4/19 presented speech difficulty and altered mental status, right facial droop and right arm weakness.  She was given TNK  INTERIM HISTORY/SUBJECTIVE  Patient is laying in the bed in no apparent distress.  No family at the bedside.  She is connected to LTM Routine EEG no seizures identified MRI is ordered, 24-hour post thrombolysis CT scan is scheduled for 1630 Neurological exam she is aphasic, able to state her name and age, has a right facial weakness with right arm with drift, Blood pressure adequately controlled.  Plan to check LTM OBJECTIVE  CBC    Component Value Date/Time   WBC 3.6 (L) 09/27/2023 1630   RBC 3.27 (L) 09/27/2023 1630   HGB 11.5 (L) 09/27/2023 1630   HGB 12.4 06/13/2014 1433   HCT 33.9 (L) 09/27/2023 1630   HCT 37.4 06/13/2014 1433   PLT 200 09/27/2023 1630   PLT 184 06/13/2014 1433   MCV 103.7 (H) 09/27/2023 1630   MCV 104 (H) 06/13/2014 1433   MCH 35.2 (H) 09/27/2023 1630   MCHC 33.9 09/27/2023 1630   RDW 12.2 09/27/2023 1630   RDW 14.2 06/13/2014 1433   LYMPHSABS 1.5 09/27/2023 1630   LYMPHSABS 1.5 10/09/2012 0922   MONOABS 0.4 09/27/2023 1630   MONOABS 0.6 10/09/2012 0922   EOSABS 0.1 09/27/2023 1630   EOSABS 0.1 10/09/2012 0922   BASOSABS 0.0 09/27/2023 1630   BASOSABS 0.0 10/09/2012 0922    BMET    Component Value Date/Time   NA 129 (L) 09/27/2023 1630   NA 142 06/13/2014 1433   K 4.2 09/27/2023 1630   K 4.2 06/13/2014 1433   CL 99 09/27/2023 1630   CL 107 06/13/2014 1433   CO2 23 09/27/2023 1630   CO2 27 06/13/2014 1433   GLUCOSE 100 (H) 09/27/2023 1630   GLUCOSE 82 06/13/2014 1433   BUN 21 09/27/2023 1630   BUN 9 06/13/2014 1433   CREATININE 0.95 09/27/2023 1630   CREATININE 0.77 06/13/2014 1433   CALCIUM  8.5 (L) 09/27/2023 1630   CALCIUM  8.4 (L) 06/13/2014 1433   GFRNONAA >60 09/27/2023 1630   GFRNONAA >60 06/13/2014 1433   GFRNONAA >60 02/27/2014 1939    IMAGING  past 24 hours CT ANGIO HEAD NECK W WO CM (CODE STROKE) Result Date: 09/27/2023 CLINICAL DATA:  Neuro deficit, concern for stroke. EXAM: CT ANGIOGRAPHY HEAD AND NECK WITH AND WITHOUT CONTRAST TECHNIQUE: Multidetector CT imaging of the head and neck was performed using the standard protocol during bolus administration of intravenous contrast. Multiplanar CT image reconstructions and MIPs were obtained to evaluate the vascular anatomy. Carotid stenosis measurements (when applicable) are obtained utilizing NASCET criteria, using the distal internal carotid diameter as the denominator. RADIATION DOSE REDUCTION: This exam was performed according to the departmental dose-optimization program which includes automated exposure control, adjustment of the mA and/or kV according to patient size and/or use of iterative reconstruction technique. CONTRAST:  75mL OMNIPAQUE  IOHEXOL  350 MG/ML SOLN COMPARISON:  Same day CT head. FINDINGS: CTA NECK FINDINGS Aortic arch: Four vessel configuration of the aortic arch. Imaged portion shows no evidence of aneurysm or dissection. Mild atherosclerosis of the aortic arch. No significant stenosis of the major arch vessel origins. Pulmonary arteries: As permitted by contrast timing, there are no filling defects in the visualized pulmonary arteries. Subclavian arteries: Patent bilaterally. Prominent atherosclerotic plaque involving the proximal right subclavian artery along with focal tortuosity of the vessel resulting in moderate stenosis. Mild atherosclerosis at  the left subclavian artery origin without significant stenosis. Right carotid system: Patent from the origin to the skull base. Tortuosity of the proximal common carotid artery. Mild atherosclerosis at the carotid bifurcation without hemodynamically significant stenosis. Mild tortuosity of the mid cervical ICA. No evidence of dissection. Left carotid system: Patent from the origin to the skull base. Mild atherosclerosis at the carotid  bifurcation without hemodynamically significant stenosis. Retropharyngeal course of the proximal cervical ICA. No evidence of dissection. Vertebral arteries: Codominant. Direct origin of the left vertebral artery on the aortic arch. The vertebral arteries are patent from the origins to the vertebrobasilar confluence. Mild tortuosity of the right V1 segment. No evidence of dissection. Distal tapering of the left V4 segment with mild stenosis just proximal to the vertebrobasilar confluence. Skeleton: No acute or aggressive finding. Postsurgical changes of the right temporal calvarium. Chronic deformity of the right zygomatic arch. Degenerative changes of the cervical spine with significant disc space narrowing at multiple levels. Other neck: The visualized airway is patent. No cervical lymphadenopathy. Upper chest: Visualized lung apices are clear. Review of the MIP images confirms the above findings CTA HEAD FINDINGS ANTERIOR CIRCULATION: The intracranial ICAs are patent bilaterally. Mild atherosclerosis of the carotid siphons without significant stenosis. No significant stenosis, proximal occlusion, aneurysm, or vascular malformation. MCAs: The middle cerebral arteries are patent bilaterally. ACAs: The anterior cerebral arteries are patent bilaterally. POSTERIOR CIRCULATION: No significant stenosis, proximal occlusion, aneurysm, or vascular malformation. PCAs: Patent bilaterally.  Fetal origin of the right PCA. Pcomm: Visualized on the right. SCAs: The superior cerebellar arteries are patent bilaterally. Basilar artery: Patent AICAs: Not well visualized. PICAs: Patent Vertebral arteries: The intracranial vertebral arteries are patent. Venous sinuses: As permitted by contrast timing, patent. Anatomic variants: As above. Review of the MIP images confirms the above findings IMPRESSION: No large vessel occlusion. No high-grade stenosis, aneurysm, or vascular malformation of the arteries in the head and neck. Mild  atherosclerosis as above. Atherosclerosis involving the bilateral carotid bifurcations without hemodynamically significant stenosis. Focal atherosclerosis along the proximal right subclavian artery resulting in moderate stenosis. Aortic Atherosclerosis (ICD10-I70.0). Electronically Signed   By: Denny Flack M.D.   On: 09/27/2023 17:14   CT HEAD CODE STROKE WO CONTRAST Result Date: 09/27/2023 CLINICAL DATA:  Code stroke. Neuro deficit, acute, stroke suspected. EXAM: CT HEAD WITHOUT CONTRAST TECHNIQUE: Contiguous axial images were obtained from the base of the skull through the vertex without intravenous contrast. RADIATION DOSE REDUCTION: This exam was performed according to the departmental dose-optimization program which includes automated exposure control, adjustment of the mA and/or kV according to patient size and/or use of iterative reconstruction technique. COMPARISON:  Head CT 07/27/2023. FINDINGS: Brain: No acute hemorrhage. Unchanged encephalomalacia in the anterior right temporal lobe, likely postoperative. Gray-white differentiation is otherwise preserved. No hydrocephalus or extra-axial collection. No mass effect or midline shift. Vascular: No hyperdense vessel or unexpected calcification. Skull: Prior right pterional craniotomy. Sinuses/Orbits: No acute finding. Other: None. ASPECTS [facility] Stroke Program Early CT Score) - Ganglionic level infarction (caudate, lentiform nuclei, internal capsule, insula, M1-M3 cortex): 7 - Supraganglionic infarction (M4-M6 cortex): 3 Total score (0-10 with 10 being normal): 10 IMPRESSION: 1. No acute intracranial hemorrhage or evidence of acute large vessel territory infarct. ASPECT score is 10. 2. Unchanged encephalomalacia in the anterior right temporal lobe, likely postoperative. Code stroke imaging results were communicated on 09/27/2023 at 4:50 pm to provider Dr. Alejo Amsler via telephone, who verbally acknowledged these results. Electronically Signed   By: Audra Blend  M.D.   On: 09/27/2023 16:52    Vitals:   09/28/23 0400 09/28/23 0500 09/28/23 0600 09/28/23 0700  BP: 107/69 92/66 125/80 112/65  Pulse: (!) 56 (!) 58 (!) 59 60  Resp: 15 11 17 18   SpO2: 96% 98% 96% 99%     PHYSICAL EXAM General: Frail elderly woman Psych:  Mood and affect appropriate for situation CV: Regular rate and rhythm on monitor Respiratory:  Regular, unlabored respirations on room air GI: Abdomen soft and nontender   NEURO:  Mental Status: AA&O self and age Speech/Language: speech is without dysarthria.  Has some aphasia naming, repetition, fluency, and comprehension intact.  Cranial Nerves:  II: PERRL. Visual fields full.  III, IV, VI: EOMI. Eyelids elevate symmetrically.  V: Sensation is intact to light touch and symmetrical to face.  VII: Right facial droop VIII: hearing intact to voice. IX, X: Palate elevates symmetrically. Phonation is normal.  YN:WGNFAOZH shrug 5/5. XII: tongue is midline without fasciculations. Motor: Right arm with drift, left arm 5/5 in bilateral lower extremities 5/5 Tone: is normal and bulk is normal Sensation- Intact to light touch bilaterally. Extinction absent to light touch to DSS.   Coordination: FTN intact bilaterally, HKS: no ataxia in BLE.No drift.  Gait- deferred   ASSESSMENT/PLAN  Marcia Brown is a 68 y.o. female with history of extensive prior history of epilepsy, but also hx of stroke, COPD, hyperlipidemia, neuropathy, OSA, dementia who presented to [facility] with AMS and speech difficulty and not answering questions. LSW 1550. Exam concerning for aphasia and right facial droop and right arm drift, but pt did have some mild facial droop from previous stroke and right arm fracture per niece.  Received IV TNK [facility] on Admission 15  Acute stroke versus seizure:  s/p TNK Etiology: Unclear workup underway Code Stroke  CT head No acute abnormality. ASPECTS 10.    Unchanged encephalomalacia in the anterior right temporal  lobe,  CTA head & neck No large vessel occlusion.  MRI ordered 24 hours CT scheduled for 1630 2D Echo ordered LDL ordered HgbA1c 4.4 VTE prophylaxis -SCDs clopidogrel  75 mg daily prior to admission, now on No antithrombotic for 24 hour pain imaging negative for hemorrhage s/p TNK Therapy recommendations:  Pending Disposition: Pending  Temporal lobe epilepsy History of stroke Sees Dr. Walden Guise at [facility] last seen on 4/16 for breakthrough seizures Continue LTM LTM 4/19 This study is suggestive of cortical dysfunction arising from right temporal region consistent with underlying craniotomy. No seizures or epileptiform discharges were seen throughout the recording. Home medications-continue while in hospital continue  Carbamazepine  150 mg twice daily. Continue Vimpat  150 mg twice daily. Continue Dilantin  XR 100 mg twice daily. Continue Lamictal  200 mg in the morning and 300 mg at night. Continue Keppra  500 mg in the morning and 1000 mg at night."  Hypertension Home meds: Losartan  50 mg Stable Blood Pressure Goal: BP less than 180/105   Hyperlipidemia Home meds: Rosuvastatin  5 mg, resumed in hospital LDL ordered goal < 70 Continue 5 mg Crestor  for now Continue statin at discharge  COPD Symbicort Home med Keep sats greater than 94%  GERD  Home Pepcid   Regular diet  Dementia Anxiety and depression On no home meds  Hyponatremia  NA 129  Saline at 40 cc will recheck in the morning    Leukopenia Anemia WBC 3.6, Hgb 11.5 will monitor   Dysphagia Patient has post-stroke dysphagia, SLP consulted    Diet   Diet NPO time specified  Diet NPO time specified   Advance diet as tolerated  Other Stroke Risk Factors Obstructive sleep apnea,    Other Active Problems Neuropathy  Hospital day # 0  Jonette Nestle DNP, ACNPC-AG  [facility]  I have personally obtained history,examined this patient, reviewed notes, independently viewed imaging  studies, participated in medical decision making and plan of care.ROS completed by me personally and pertinent positives fully documented  I have made any additions or clarifications directly to the above note. Agree with note above.  Patient with known history of seizures presented with sudden onset of aphasia and language difficulties felt to be not typical for her seizures and was treated with IV TNK for presumed left hemispheric infarct.  Continue close neurological monitoring and strict blood pressure control as per post TNK protocol.  Continue seizure medications at home dosages.  Mobilize out of bed.  Therapy consults.  Check MRI scan of the brain later today.  No family at the bedside.This patient is critically ill and at significant risk of neurological worsening, death and care requires constant monitoring of vital signs, hemodynamics,respiratory and cardiac monitoring, extensive review of multiple databases, frequent neurological assessment, discussion with family, other specialists and medical decision making of high complexity.I have made any additions or clarifications directly to the above note.This critical care time does not reflect procedure time, or teaching time or supervisory time of PA/NP/Med Resident etc but could involve care discussion time.  I spent 30 minutes of neurocritical care time  in the care of  this patient.      Ardella Beaver, MD Medical Director [facility] Stroke Center Pager: [phone] 09/28/2023 3:34 PM   To contact Stroke Continuity provider, please refer to [email or web address]. After hours, contact General Neurology

## 2023-09-28 NOTE — Progress Notes (Signed)
 PT Cancellation Note  Patient Details Name: Marcia Brown MRN: [id number] DOB: [birth year]   Cancelled Treatment:    Reason Eval/Treat Not Completed: Active bedrest order (TNK administered 4/18 at 1630, per policy hold PT eval x24 hours unless otherwise specified by stroke team.)  [name], PT DPT Acute Rehabilitation Services Secure Chat Preferred  Office [phone]   [name] Draft 09/28/2023, 7:48 AM

## 2023-09-28 NOTE — Progress Notes (Addendum)
 Prior to MRI scan, patient became more restless and pulling medical items off. Patients neuro exam is still the same, but more restless. Paged Dr. Janett Medin and Jonette Nestle, NP, of Stroke team.  Verbal order for 2mg  of Ativan  to be administered for MRI.  1200- Patient still restless after Ativan  administration, administered 2mg  of Haldol  (0.86mL).   1230- Scan wasn't completely unfortunately because of patients' agitation. Dr. Janett Medin notified. No new orders at this time (Plans for a CT scan around 1600 were already in place).  Jossalin Chervenak, RN

## 2023-09-28 NOTE — Procedures (Signed)
 Patient Name: Marcia Brown  MRN: [id number]  Epilepsy Attending: Arleene Lack  Referring Physician/Provider: Kimberley Penman, MD  Date: 09/28/2023 Duration: 22.26 mins  Patient history: 68 year old female with an extensive prior history of epilepsy, but also hx of stroke, COPD, hyperlipidemia, neuropathy, OSA, dementia who presented to [facility] with AMS and speech difficulty and not answering questions. LSW 1550. Exam concerning for aphasia and right facial droop and right arm drift. EEG to evaluate for seizure  Level of alertness: Awake, asleep  AEDs during EEG study: CBZ, GBP, LCM, LTG, LEV, PHT, Ativan   Technical aspects: This EEG study was done with scalp electrodes positioned according to the 10-20 International system of electrode placement. Electrical activity was reviewed with band pass filter of 1-70Hz , sensitivity of 7 uV/mm, display speed of 62mm/sec with a 60Hz  notched filter applied as appropriate. EEG data were recorded continuously and digitally stored.  Video monitoring was available and reviewed as appropriate.  Description: The posterior dominant rhythm consists of 8 Hz activity of moderate voltage (25-35 uV) seen predominantly in posterior head regions, symmetric and reactive to eye opening and eye closing. Sleep was characterized by vertex waves, sleep spindles (12 to 14 Hz), maximal frontocentral region. EEG showed continuous high amplitude 3 to 6 Hz theta-delta slowing in right temporal region consistent with breach artifact. Hyperventilation and photic stimulation were not performed.     ABNORMALITY - Breach artifact, right temporal region  IMPRESSION: This study is suggestive of cortical dysfunction arising from right temporal region consistent with underlying craniotomy. No seizures or epileptiform discharges were seen throughout the recording.  Nelia Rogoff O Jhonnie Aliano

## 2023-09-28 NOTE — H&P (Signed)
 NEUROLOGY H&P NOTE   Date of service: September 28, 2023 Patient Name: Marcia Brown MRN:  [id number] DOB:  1956/01/24 Chief Complaint: Stroke versus seizure with postictal state  History of Present Illness  Marcia Brown is a 67 y.o. female with an extensive prior history of epilepsy, but also hx of stroke, COPD, hyperlipidemia, neuropathy, OSA, dementia who presented to [facility] with AMS and speech difficulty and not answering questions. LSW 1550. Exam concerning for aphasia and right facial droop and right arm drift, but pt did have some mild facial droop from previous stroke and right arm fracture per niece. DDx included stroke vs. Seizure with postictal state. Pt was within TNK window and no contraindications, discussed with niece, she said that pt usually would be quickly coming out of a seizure, not lasting this long. Cr. Marcia Brown discussed with her about TNK benefit, risk and alternative. She was willing to proceed. TNK given. CTA head and neck no LVO. She has been transferred to [facility] for LTM EEG given extensive hx of epilepsy and on multiple seizure meds. Also will receive post-TNK monitoring and care here.   Per her outpatient Neurology note from 4/16 [facility] clinic): "Patient presents for follow-up as she has been in the ED as well as admitted to the hospital since last office visit in December 2024 and earlier this month on/11/2023 with breakthrough seizures. During admission in December tramadol  was discontinued given concern for lower seizure threshold. However she did have breakthrough seizure again on 09/15/2023. Patient's seizure medications have not been changed since last office visit. Obtain labs to check anti-seizure medication levels  Additional recommendations pending results of drug levels  Continue Carbamazepine  150 mg twice daily. Continue Vimpat  150 mg twice daily. Continue Dilantin  XR 100 mg twice daily. Continue Lamictal  200 mg in the morning and 300 mg at night. Continue Keppra  500 mg in  the morning and 1000 mg at night."   ROS  Unable to perform a comprehensive ROS due to confusion.  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   Seizures (HCC)    Sleep apnea    TBI (traumatic brain injury) (HCC)    Past Surgical History:  Procedure Laterality Date   ESOPHAGEAL DILATION     FOOT SURGERY Right    OPEN REDUCTION INTERNAL FIXATION (ORIF) DISTAL RADIAL FRACTURE Right 11/12/2022   Procedure: OPEN REDUCTION INTERNAL FIXATION (ORIF) DISTAL RADIUS FRACTURE;  Surgeon: Marcia Singer, MD;  Location: [facility] ORS;  Service: Orthopedics;  Laterality: Right;   Family History  Problem Relation Age of Onset   Breast cancer Daughter 64   Social History   Socioeconomic History   Marital status: Divorced    Spouse name: Not on file   Number of children: Not on file   Years of education: Not on file   Highest education level: Not on file  Occupational History   Not on file  Tobacco Use   Smoking status: Every Day    Current packs/day: 0.50    Types: Cigarettes   Smokeless tobacco:  Never  Substance and Sexual Activity   Alcohol  use: No   Drug use: No   Sexual activity: Not on file  Other Topics Concern   Not on file  Social History Narrative   Not on file   Social Drivers of Health   Financial Resource Strain: Not on file  Food Insecurity: No Food Insecurity (11/13/2022)   Hunger Vital Sign    Worried About Running Out of Food in the Last Year: Never true    Ran Out of Food in the Last Year: Never true  Transportation Needs: No Transportation Needs (11/13/2022)   PRAPARE - Administrator, Civil Service (Medical): No    Lack of  Transportation (Non-Medical): No  Physical Activity: Not on file  Stress: Not on file  Social Connections: Not on file   Allergies  Allergen Reactions   Ciprofloxacin Other (See Comments)    Other Reaction: Lowered seizure threshold   Aspirin Other (See Comments)    Reaction: Unknown    Medications   Current Facility-Administered Medications:    Chlorhexidine  Gluconate Cloth 2 % PADS 6 each, 6 each, Topical, Daily, Marcia Heard, MD, 6 each at 09/28/23 0343   Oral care mouth rinse, 15 mL, Mouth Rinse, PRN, Marcia Severs, MD   Vitals   There were no vitals filed for this visit.   There is no height or weight on file to calculate BMI.  Physical Exam   Physical Exam  HEENT-  Parachute/AT    Lungs- Respirations unlabored Extremities- Warm and well perfused  Neurological Examination Mental Status: Awakens to voice to a drowsy state with decreased level of alertness. She is confused. Speech is sparse but fluent without evidence of aphasia.  Able to follow all simple commands without difficulty. Poorly oriented. Decreased ability to cooperate with exam. Cranial Nerves: II: Moderate vision loss OD with difficulty counting fingers. Intact nasal and temporal visual fields OS. PERRL  III,IV, VI: No ptosis. EOMI.  V: Temp sensation equal bilaterally  VII: Right facial droop VIII: Hearing intact to voice IX,X: No hoarseness XI: Symmetric shoulder shrug XII: Midline tongue extension Motor: Can elevate both arms antigravity but right elevates less than left. Can grip equally with both hands and push/pull bilaterally with 4/5 strength.  BLE able to elevate antigravity but cannot follow commands for detailed motor testing.   Sensory: Touch sensation grossly intact x 4 but unable to answer detailed questions regarding symmetry.  Deep Tendon Reflexes: Normoactive.  Cerebellar: No gross ataxia noted on limited exam.   Gait: Deferred Other: No jerking, twitching or other seizure-like activity  noted.    Labs   CBC:  Recent Labs  Lab 09/27/23 1630  WBC 3.6*  NEUTROABS 1.5*  HGB 11.5*  HCT 33.9*  MCV 103.7*  PLT 200   Basic Metabolic Panel:  Lab Results  Component Value Date   NA 129 (L) 09/27/2023   K 4.2 09/27/2023   CO2 23 09/27/2023   GLUCOSE 100 (H) 09/27/2023   BUN 21 09/27/2023   CREATININE 0.95 09/27/2023   CALCIUM  8.5 (L) 09/27/2023   GFRNONAA >60 09/27/2023   GFRAA >60 11/27/2019   Lipid Panel: No results found for: "LDLCALC" HgbA1c:  Lab Results  Component Value Date   HGBA1C 5.5 06/26/2019   Urine Drug Screen:     Component Value Date/Time   LABOPIA NONE DETECTED 11/14/2016 1319   LABOPIA NONE DETECTED 11/05/2010 2103   COCAINSCRNUR NONE DETECTED 11/14/2016 1319   LABBENZ NONE DETECTED 11/14/2016  1319   LABBENZ POSITIVE (A) 11/05/2010 2103   AMPHETMU NONE DETECTED 11/14/2016 1319   AMPHETMU NONE DETECTED 11/05/2010 2103   THCU NONE DETECTED 11/14/2016 1319   THCU NONE DETECTED 11/05/2010 2103   LABBARB NONE DETECTED 11/14/2016 1319   LABBARB  11/05/2010 2103    NONE DETECTED        DRUG SCREEN FOR MEDICAL PURPOSES ONLY.  IF CONFIRMATION IS NEEDED FOR ANY PURPOSE, NOTIFY LAB WITHIN 5 DAYS.        LOWEST DETECTABLE LIMITS FOR URINE DRUG SCREEN Drug Class       Cutoff (ng/mL) Amphetamine      1000 Barbiturate      200 Benzodiazepine   200 Tricyclics       300 Opiates          300 Cocaine          300 THC              50    Alcohol  Level     Component Value Date/Time   ETH <10 09/27/2023 1630   INR  Lab Results  Component Value Date   INR 1.0 09/27/2023   APTT  Lab Results  Component Value Date   APTT 29 09/27/2023       Assessment  68 year old female with an extensive prior history of epilepsy, but also hx of stroke, COPD, hyperlipidemia, neuropathy, OSA, dementia who presented to [facility] with AMS and speech difficulty and not answering questions. LSW 1550. Exam concerning for aphasia and right facial droop and right  arm drift, but pt did have some mild facial droop from previous stroke and right arm fracture per niece. DDx included stroke vs. Seizure with postictal state. Pt was within TNK window and no contraindications, discussed with niece, she said that pt usually would be quickly coming out of a seizure, not lasting this long. Cr. Marcia Brown discussed with her about TNK benefit, risk and alternative. She was willing to proceed. TNK given. CTA head and neck no LVO. She has been transferred to [facility] for LTM EEG given extensive hx of epilepsy and on multiple seizure meds. Also will receive post-TNK monitoring and care here.  - Exam reveals a drowsy state with decreased level of alertness. She is confused. Speech is sparse but fluent without evidence of aphasia.  Able to follow all simple commands without difficulty. Poorly oriented. Decreased ability to cooperate with exam. Moderate vision loss OD with difficulty counting fingers. Intact nasal and temporal visual fields OS. Right facial droop. Can elevate both arms antigravity but right elevates less than left. BLE able to elevate antigravity but cannot follow commands for detailed motor testing. Touch sensation grossly intact x 4. No jerking, twitching or other seizure-like activity noted.  - CT head prior to TNK at OSH: No acute intracranial hemorrhage or evidence of acute large vessel territory infarct. ASPECT score is 10. Unchanged encephalomalacia in the anterior right temporal lobe, likely postoperative. - CTA of head and neck at OSH: No large vessel occlusion. No high-grade stenosis, aneurysm, or vascular malformation of the arteries in the head and neck. Atherosclerosis involving the bilateral carotid bifurcations without hemodynamically significant stenosis. Focal atherosclerosis along the proximal right subclavian artery resulting in moderate stenosis. Aortic atherosclerosis. -   Recommendations    Transferred to [facility] ICU for post TNK care and [facility]  for [facility] given extensive epilepsy history (ordered) Check blood pressure and NIHSS every every hour for 16 h  BP goal < 180/105 CT or MRI brain 24 hours post thrombolytics Stat CT head without contrast if acute neuro changes NPO until swallowing screen performed and passed No antiplatelet agents or anticoagulants (including heparin for DVT prophylaxis) in first 24 hours No Foley catheter, nasogastric tube, arterial catheter or central venous catheter for 24 hours, unless absolutely necessary Telemetry Euglycemia  Avoid hyperthermia, PRN acetaminophen  DVT prophylaxis with SCDs Stroke Team to follow Continue Carbamazepine  150 mg twice daily. Continue Vimpat  150 mg twice daily. Continue Dilantin  XR 100 mg twice daily. Continue Lamictal  200 mg in the morning and 300 mg at night. Continue Keppra  500 mg in the morning and 1000 mg at night.   ______________________________________________________________________   Hope Ly, Zelia Yzaguirre, MD [facility]

## 2023-09-28 NOTE — ED Notes (Signed)
 Pt unable to sign consent & no family at bedside.

## 2023-09-28 NOTE — Progress Notes (Signed)
 Echocardiogram 2D Echocardiogram has been performed.  Alix Aquas Qaadir Kent 09/28/2023, 2:12 PM

## 2023-09-28 NOTE — Progress Notes (Signed)
 EEG complete - results pending

## 2023-09-29 ENCOUNTER — Inpatient Hospital Stay (HOSPITAL_COMMUNITY)

## 2023-09-29 DIAGNOSIS — R569 Unspecified convulsions: Secondary | ICD-10-CM | POA: Diagnosis not present

## 2023-09-29 DIAGNOSIS — R2990 Unspecified symptoms and signs involving the nervous system: Secondary | ICD-10-CM

## 2023-09-29 LAB — LIPID PANEL
Cholesterol: 179 mg/dL (ref 0–200)
HDL: 70 mg/dL (ref >40)
LDL Cholesterol: 88 mg/dL (ref 0–99)
Total CHOL/HDL Ratio: 2.6 ratio
Triglycerides: 106 mg/dL (ref <150)
VLDL: 21 mg/dL (ref 0–40)

## 2023-09-29 MED ORDER — CLOPIDOGREL BISULFATE 75 MG PO TABS
75.0000 mg | ORAL_TABLET | Freq: Every day | ORAL | Status: DC
  Administered 2023-09-29 – 2023-10-01 (×3): 75 mg via ORAL
  Filled 2023-09-29 (×3): qty 1

## 2023-09-29 MED ORDER — ENOXAPARIN SODIUM 40 MG/0.4ML IJ SOSY
40.0000 mg | PREFILLED_SYRINGE | INTRAMUSCULAR | Status: DC
  Administered 2023-09-29 – 2023-10-01 (×3): 40 mg via SUBCUTANEOUS
  Filled 2023-09-29 (×3): qty 0.4

## 2023-09-29 NOTE — Evaluation (Signed)
 Physical Therapy Evaluation Patient Details Name: Marcia Brown MRN: [id number] DOB: 07/04/1955 Today's Date: 09/29/2023  History of Present Illness  Marcia Brown is a 68 y.o. female with an extensive prior history of epilepsy, but also hx of stroke, COPD, hyperlipidemia, neuropathy, OSA, dementia who presented to [facility] with AMS and speech difficulty and not answering questions. LSW 1550. Exam concerning for aphasia and right facial droop and right arm drift, but pt did have some mild facial droop from previous stroke and right arm fracture per niece  Clinical Impression   Pt admitted with above diagnosis. Lives at ALF; Prior to admission, pt was able to walk without assistance, and she had help as needed for staff at ALF; Presents to PT with decr functional independence, generalized weakness, uncoordinated gait;  Today, pt needed min assist for sit to stand, transfers, and to walk a very short distance with rW in the room; She very much wants to get back to her current living situation (ALF), I wonder if her ALF cna meet her mobility and ADL needs, or be able to ramp up her services; In considering options for discharge, I value going back to familiar environment, caregivers, and routines for patients with dementia; if not, we must consider SNF; Pt currently with functional limitations due to the deficits listed below (see PT Problem List). Pt will benefit from skilled PT to increase their independence and safety with mobility to allow discharge to the venue listed below.           If plan is discharge home, recommend the following: A little help with walking and/or transfers;A lot of help with bathing/dressing/bathroom   Can travel by private vehicle   No    Equipment Recommendations Rolling walker (2 wheels);BSC/3in1  Recommendations for Other Services  OT consult    Functional Status Assessment Patient has had a recent decline in their functional status and demonstrates the ability to make  significant improvements in function in a reasonable and predictable amount of time.     Precautions / Restrictions Precautions Precautions: Fall Recall of Precautions/Restrictions: Impaired Restrictions Weight Bearing Restrictions Per Provider Order: No      Mobility  Bed Mobility Overal bed mobility: Needs Assistance Bed Mobility: Supine to Sit     Supine to sit: Min assist     General bed mobility comments: Min assist for lines/leads, and to square off hips at EOB    Transfers Overall transfer level: Needs assistance Equipment used: Rolling walker (2 wheels) Transfers: Sit to/from Stand Sit to Stand: Contact guard assist           General transfer comment: Cues for hand placement and safety; sat back to bed without warning on first sit to stand    Ambulation/Gait Ambulation/Gait assistance: Min assist Gait Distance (Feet): 6 Feet Assistive device: Rolling walker (2 wheels) Gait Pattern/deviations: Step-through pattern       General Gait Details: Gait was overall steady and symmetrical with RW; min assist and cues for RW control and proximity  Stairs            Wheelchair Mobility     Tilt Bed    Modified Rankin (Stroke Patients Only) Modified Rankin (Stroke Patients Only) Pre-Morbid Rankin Score: No significant disability Modified Rankin: Moderate disability     Balance Overall balance assessment: Needs assistance   Sitting balance-Leahy Scale: Fair       Standing balance-Leahy Scale: Poor  Pertinent Vitals/Pain Pain Assessment Pain Assessment: No/denies pain    Home Living Family/patient expects to be discharged to:: Assisted living ([facility] [place])                 Home Equipment: Other (comment) (unsure; patient unable to remember equipment) Additional Comments: lives at the [facility] [address], reports intermittent assistance as needed, and that she hopes to return back to the  people she's familiar with    Prior Function Prior Level of Function : Independent/Modified Independent;History of Falls (last six months)             Mobility Comments: reports intermittent use of RW at all times while at ALF, often tries to ambulate without AD. Multiple falls, has lost count over the past 6 months per patient reports. ADLs Comments: per patient reports IND with ADLs; but can have assistance if needed     Extremity/Trunk Assessment   Upper Extremity Assessment Upper Extremity Assessment: Defer to OT evaluation    Lower Extremity Assessment Lower Extremity Assessment: Generalized weakness       Communication   Communication Communication: No apparent difficulties    Cognition Arousal: Alert Behavior During Therapy: WFL for tasks assessed/performed   PT - Cognitive impairments: History of cognitive impairments                       PT - Cognition Comments: Pt asking about how long it might be before she gets back to the [place] Following commands: Intact       Cueing Cueing Techniques: Verbal cues, Tactile cues     General Comments General comments (skin integrity, edema, etc.): BP supine initially 97/55; 112/56 seated, post short walk    Exercises     Assessment/Plan    PT Assessment Patient needs continued PT services  PT Problem List Decreased strength;Decreased activity tolerance;Decreased balance;Decreased mobility;Decreased coordination;Decreased cognition;Decreased knowledge of use of DME;Decreased safety awareness;Cardiopulmonary status limiting activity;Decreased knowledge of precautions       PT Treatment Interventions DME instruction;Gait training;Stair training;Functional mobility training;Therapeutic activities;Balance training;Therapeutic exercise;Neuromuscular re-education;Cognitive remediation;Patient/family education;Wheelchair mobility training    PT Goals (Current goals can be found in the Care Plan section)  Acute  Rehab PT Goals Patient Stated Goal: To get back to the [facility] PT Goal Formulation: With patient Time For Goal Achievement: 10/13/23 Potential to Achieve Goals: Good    Frequency Min 3X/week     Co-evaluation               AM-PAC PT "6 Clicks" Mobility  Outcome Measure Help needed turning from your back to your side while in a flat bed without using bedrails?: A Little Help needed moving from lying on your back to sitting on the side of a flat bed without using bedrails?: A Little Help needed moving to and from a bed to a chair (including a wheelchair)?: A Little Help needed standing up from a chair using your arms (e.g., wheelchair or bedside chair)?: A Little Help needed to walk in hospital room?: A Lot Help needed climbing 3-5 steps with a railing? : A Lot 6 Click Score: 16    End of Session Equipment Utilized During Treatment: Gait belt Activity Tolerance: Patient tolerated treatment well Patient left: in chair;with call bell/phone within reach;with chair alarm set Nurse Communication: Mobility status PT Visit Diagnosis: Unsteadiness on feet (R26.81);Muscle weakness (generalized) (M62.81)    Time: 1540-1600 PT Time Calculation (min) (ACUTE ONLY): 20 min   Charges:  PT Evaluation $PT Eval Moderate Complexity: 1 Mod   PT General Charges $$ ACUTE PT VISIT: 1 Visit         Darcus Eastern, PT  Acute Rehabilitation Services Office [phone] Secure Chat welcomed   Marcial Setting 09/29/2023, 5:19 PM

## 2023-09-29 NOTE — Progress Notes (Signed)
 LTM maint complete - no skin breakdown seen. [facility] monitored, Event button test confirmed by [facility].

## 2023-09-29 NOTE — Evaluation (Signed)
 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   Right wrist fracture, closed, initial encounter 11/12/2022   COPD (chronic obstructive pulmonary disease) (HCC) 11/12/2022   Anxiety 11/12/2022   Sleep apnea 11/12/2022   Dizzy spells 11/12/2022   Wrist fracture, closed, right, initial encounter 11/12/2022   Seizures (HCC) 08/04/2019   Acute lower UTI 06/26/2019   Hypokalemia 06/26/2019   AMS (altered mental status) 06/26/2019   Tobacco abuse 12/27/2015   Repeated falls 05/24/2015   Seizure (HCC) 02/03/2015   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   Past Medical History:  Past Medical History:  Diagnosis Date   Anxiety    Arthritis    Asthma    Cerebral ischemia    COPD (chronic obstructive pulmonary disease) (HCC)    Dementia (HCC)    after TBI   Depression    Depression    GERD (gastroesophageal reflux disease)    High cholesterol    Hyperlipidemia    Neuropathy    Polyneuropathy    Renal disorder    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 female with PMH: epilepsy, CVA, COPD, HLD, neuropathy, OSA, TBI, GERD,  dementia who presented to [facility] on [facility]/23 with AMS, speech difficulty. Exam concerning for aphasia, right facial droop and right arm drift but patient did have mild facial droop from previous stroke and right arm fracture per niece's report. TNK was given. She was transferred to [facility] for LTM EEG and post-TNK monitoring and care. CT head did not show evidence of acute intracranial abnormality. MRI brain pending.   Assessment / Plan / Recommendation Clinical Impression  Patient presenting with cognitive-linguistic and speech impairment as per this evaluation. She exhibits right facial droop but this impacts the quality/accuracy of her speech production but does not affect the overall intelligibility. No family present during this evaluation and patient unable to report accurate history and so at this point, difficult to determine how far from her baseline she is given h/o TBI, dementia and CVA (2017). Patient was pleasant, oriented to self, aware she was not at home but unable to state where she was. She stated her correct birthday but not age. She exhibited significant difficulty with providing names of people or places and was not able to describe any specific information about herself. She was externally distracted by people walking by in hallway, but also by too many items on tray table. When her meal tray arrived, she was not able to initiate self feeding as she was distracted and looking at meal ticket, napkin, etc. After SLP removed all superfluous items on tray and pulled curtain in room  to cover door to hallway, she was able to then maintain attention to self feed for a period of approximately five minutes before stopping. SLP will follow patient while admited to address cognitive-linguistic and speech impairments.    SLP Assessment  SLP Recommendation/Assessment: Patient needs continued Speech Lanaguage Pathology Services SLP Visit Diagnosis: Dysarthria and anarthria (R47.1);Aphasia (R47.01);Cognitive  communication deficit (R41.841)    Recommendations for follow up therapy are one component of a multi-disciplinary discharge planning process, led by the attending physician.  Recommendations may be updated based on patient status, additional functional criteria and insurance authorization.    Follow Up Recommendations  Skilled nursing-short term rehab (<3 hours/day)    Assistance Recommended at Discharge  Frequent or constant Supervision/Assistance  Functional Status Assessment Patient has had a recent decline in their functional status and demonstrates the ability to make significant improvements in function in a reasonable and predictable amount of time.  Frequency and Duration min 2x/week  2 weeks      SLP Evaluation Cognition  Overall Cognitive Status: Difficult to assess Arousal/Alertness: Awake/alert Orientation Level: Oriented to person;Disoriented to time;Disoriented to place;Disoriented to situation Month: January Day of Week: Incorrect Attention: Sustained Sustained Attention: Impaired Sustained Attention Impairment: Verbal basic;Functional basic Awareness: Impaired Awareness Impairment: Intellectual impairment;Emergent impairment Problem Solving: Impaired Problem Solving Impairment: Functional basic Executive Function: Landscape architect: Impaired Organizing Impairment: Functional basic Behaviors: Restless Safety/Judgment: Impaired       Comprehension  Auditory Comprehension Overall Auditory Comprehension: Impaired Yes/No Questions: Not tested Conversation: Simple Interfering Components: Processing speed EffectiveTechniques: Extra processing time;Repetition Visual Recognition/Discrimination Discrimination: Not tested Reading Comprehension Reading Status: Not tested    Expression Expression Primary Mode of Expression: Verbal Verbal Expression Overall Verbal Expression: Impaired Initiation: No impairment Level of Generative/Spontaneous Verbalization:  Sentence;Phrase Pragmatics: Impairment Impairments: Topic maintenance Interfering Components: Premorbid deficit Non-Verbal Means of Communication: Not applicable   Oral / Motor  Oral Motor/Sensory Function Overall Oral Motor/Sensory Function: Mild impairment Facial ROM: Reduced right;Suspected CN VII (facial) dysfunction Facial Symmetry: Abnormal symmetry right;Suspected CN VII (facial) dysfunction Facial Strength: Reduced right;Suspected CN VII (facial) dysfunction Motor Speech Overall Motor Speech: Impaired Respiration: Within functional limits Phonation: Normal Resonance: Within functional limits Articulation: Impaired Level of Impairment: Sentence Intelligibility: Intelligible Word: 75-100% accurate Phrase: 75-100% accurate Sentence: 75-100% accurate Conversation: 75-100% accurate Motor Planning: Not tested Motor Speech Errors: Not applicable            Jacqualine Mater, MA, CCC-SLP Speech Therapy

## 2023-09-29 NOTE — Procedures (Addendum)
 Patient Name: Marcia Brown  MRN: [id number]  Epilepsy Attending: Arleene Lack  Referring Physician/Provider: Kimberley Penman, MD  Duration: 09/28/2023 4742 to 09/29/2023 0950   Patient history: 67 year old female with an extensive prior history of epilepsy, but also hx of stroke, COPD, hyperlipidemia, neuropathy, OSA, dementia who presented to [facility] with AMS and speech difficulty and not answering questions. LSW 1550. Exam concerning for aphasia and right facial droop and right arm drift. EEG to evaluate for seizure   Level of alertness: Awake, asleep   AEDs during EEG study: CBZ, GBP, LCM, LTG, LEV, PHT, Ativan    Technical aspects: This EEG study was done with scalp electrodes positioned according to the 10-20 International system of electrode placement. Electrical activity was reviewed with band pass filter of 1-70Hz , sensitivity of 7 uV/mm, display speed of 50mm/sec with a 60Hz  notched filter applied as appropriate. EEG data were recorded continuously and digitally stored.  Video monitoring was available and reviewed as appropriate.   Description: The posterior dominant rhythm consists of 8 Hz activity of moderate voltage (25-35 uV) seen predominantly in posterior head regions, symmetric and reactive to eye opening and eye closing. Sleep was characterized by vertex waves, sleep spindles (12 to 14 Hz), maximal frontocentral region. EEG showed continuous high amplitude 3 to 6 Hz theta-delta slowing in right temporal region consistent with breach artifact. Spikes were noted in left frontal-anterior temporal region, predominantly during sleep. Hyperventilation and photic stimulation were not performed.      ABNORMALITY - Spike, LEFT frontal-anterior temporal region - Breach artifact, right temporal region   IMPRESSION: This study showed evidence of epileptogenicity arising from LEFT frontal-anterior temporal region. Additionally there is cortical dysfunction arising from right temporal region  likely secondary to underlying structural abnormality. No seizures were seen throughout the recording.     Yolanda Dockendorf O Layann Bluett

## 2023-09-29 NOTE — Progress Notes (Signed)
 PT Cancellation Note  Patient Details Name: Marcia Brown MRN: [id number] DOB: Jul 26, 1955   Cancelled Treatment:    Reason Eval/Treat Not Completed: Active bedrest order; Outside of TNK admin window; Contacted pt's Nurse to inquire about lifting bedrest order;   Darcus Eastern, PT  Acute Rehabilitation Services Office [phone] Secure Chat welcomed    Marcial Setting 09/29/2023, 7:55 AM

## 2023-09-29 NOTE — Progress Notes (Signed)
 EEG LTM D/C'd. No noted skin break down. [facility] notified.

## 2023-09-29 NOTE — Progress Notes (Addendum)
 STROKE TEAM PROGRESS NOTE    SIGNIFICANT HOSPITAL EVENTS 4/19 presented speech difficulty and altered mental status, right facial droop and right arm weakness.  She was given TNK  INTERIM HISTORY/SUBJECTIVE Patient is laying in the bed in NAD.  Mental status is improved.  He is following some commands and able to communicate.  No family at the bedside.  LTM 4/20 This study showed evidence of epileptogenicity arising from LEFT frontal-anterior temporal region. Additionally there is cortical dysfunction arising from right temporal region likely secondary to underlying structural abnormality. No seizures were seen throughout the recording   Will d/c LTM.  Labs and vitals are stable.  Will transfer to the floor today    CBC    Component Value Date/Time   WBC 3.6 (L) 09/27/2023 1630   RBC 3.27 (L) 09/27/2023 1630   HGB 11.5 (L) 09/27/2023 1630   HGB 12.4 06/13/2014 1433   HCT 33.9 (L) 09/27/2023 1630   HCT 37.4 06/13/2014 1433   PLT 200 09/27/2023 1630   PLT 184 06/13/2014 1433   MCV 103.7 (H) 09/27/2023 1630   MCV 104 (H) 06/13/2014 1433   MCH 35.2 (H) 09/27/2023 1630   MCHC 33.9 09/27/2023 1630   RDW 12.2 09/27/2023 1630   RDW 14.2 06/13/2014 1433   LYMPHSABS 1.5 09/27/2023 1630   LYMPHSABS 1.5 10/09/2012 0922   MONOABS 0.4 09/27/2023 1630   MONOABS 0.6 10/09/2012 0922   EOSABS 0.1 09/27/2023 1630   EOSABS 0.1 10/09/2012 0922   BASOSABS 0.0 09/27/2023 1630   BASOSABS 0.0 10/09/2012 0922    BMET    Component Value Date/Time   NA 129 (L) 09/27/2023 1630   NA 142 06/13/2014 1433   K 4.2 09/27/2023 1630   K 4.2 06/13/2014 1433   CL 99 09/27/2023 1630   CL 107 06/13/2014 1433   CO2 23 09/27/2023 1630   CO2 27 06/13/2014 1433   GLUCOSE 100 (H) 09/27/2023 1630   GLUCOSE 82 06/13/2014 1433   BUN 21 09/27/2023 1630   BUN 9 06/13/2014 1433   CREATININE 0.95 09/27/2023 1630   CREATININE 0.77 06/13/2014 1433   CALCIUM  8.5 (L) 09/27/2023 1630   CALCIUM  8.4 (L) 06/13/2014  1433   GFRNONAA >60 09/27/2023 1630   GFRNONAA >60 06/13/2014 1433   GFRNONAA >60 02/27/2014 1939    IMAGING past 24 hours Overnight EEG with video Result Date: 09/29/2023 Arleene Lack, MD     09/29/2023 11:22 AM Patient Name: ARRIONNA SERENA MRN: [id number] Epilepsy Attending: Arleene Lack Referring Physician/Provider: Kimberley Penman, MD Duration: 09/28/2023 1914 to 09/29/2023 0950  Patient history: 68 year old female with an extensive prior history of epilepsy, but also hx of stroke, COPD, hyperlipidemia, neuropathy, OSA, dementia who presented to [facility] with AMS and speech difficulty and not answering questions. LSW 1550. Exam concerning for aphasia and right facial droop and right arm drift. EEG to evaluate for seizure  Level of alertness: Awake, asleep  AEDs during EEG study: CBZ, GBP, LCM, LTG, LEV, PHT, Ativan   Technical aspects: This EEG study was done with scalp electrodes positioned according to the 10-20 International system of electrode placement. Electrical activity was reviewed with band pass filter of 1-70Hz , sensitivity of 7 uV/mm, display speed of 19mm/sec with a 60Hz  notched filter applied as appropriate. EEG data were recorded continuously and digitally stored.  Video monitoring was available and reviewed as appropriate.  Description: The posterior dominant rhythm consists of 8 Hz activity of moderate voltage (25-35 uV) seen predominantly  in posterior head regions, symmetric and reactive to eye opening and eye closing. Sleep was characterized by vertex waves, sleep spindles (12 to 14 Hz), maximal frontocentral region. EEG showed continuous high amplitude 3 to 6 Hz theta-delta slowing in right temporal region consistent with breach artifact. Spikes were noted in left frontal-anterior temporal region, predominantly during sleep. Hyperventilation and photic stimulation were not performed.    ABNORMALITY - Spike, LEFT frontal-anterior temporal region - Breach artifact, right temporal region   IMPRESSION: This study showed evidence of epileptogenicity arising from LEFT frontal-anterior temporal region. Additionally there is cortical dysfunction arising from right temporal region likely secondary to underlying structural abnormality. No seizures were seen throughout the recording.  Priyanka Suzanne Erps   CT HEAD WO CONTRAST ( ) Result Date: 09/28/2023 CLINICAL DATA:  Stroke, follow up EXAM: CT HEAD WITHOUT CONTRAST TECHNIQUE: Contiguous axial images were obtained from the base of the skull through the vertex without intravenous contrast. RADIATION DOSE REDUCTION: This exam was performed according to the departmental dose-optimization program which includes automated exposure control, adjustment of the mA and/or kV according to patient size and/or use of iterative reconstruction technique. COMPARISON:  CT head April 18, 25. FINDINGS: Limited by artifact from the patient's hair implants. Within this limitation: Brain: No evidence of acute large vascular territory infarction, hemorrhage, hydrocephalus, extra-axial collection or mass lesion/mass effect. Vascular: No visible hyperdense vessel. Skull: No acute fracture. Sinuses/Orbits: Clear sinuses. No acute orbital findings. Remote facial fractures. IMPRESSION: Significantly limited study without evidence of acute intracranial abnormality. Please see forthcoming/ordered MRI for further evaluation. Electronically Signed   By: Stevenson Elbe M.D.   On: 09/28/2023 20:11   ECHOCARDIOGRAM COMPLETE Result Date: 09/28/2023    ECHOCARDIOGRAM REPORT   Patient Name:   ANACRISTINA STEFFEK Date of Exam: 09/28/2023 Medical Rec #:  [id number]    Height:       64.0 in Accession #:    [id number]   Weight:       152.8 lb Date of Birth:  22-Oct-1955    BSA:          1.745 m Patient Age:    68 years     BP:           110/88 mmHg Patient Gender: F            HR:           56 bpm. Exam Location:  Inpatient Procedure: 2D Echo, Cardiac Doppler and Color Doppler (Both Spectral and Color             Flow Doppler were utilized during procedure). Indications:    Stroke  History:        Patient has no prior history of Echocardiogram examinations.                 Risk Factors:Current Smoker.  Sonographer:    Reta Cassis Referring Phys: [id number] ERIC LINDZEN IMPRESSIONS  1. Left ventricular ejection fraction, by estimation, is 60 to 65%. The left ventricle has normal function. The left ventricle has no regional wall motion abnormalities. Left ventricular diastolic parameters were normal.  2. Right ventricular systolic function is normal. The right ventricular size is normal. There is normal pulmonary artery systolic pressure.  3. Left atrial size was severely dilated.  4. The mitral valve is normal in structure. Trivial mitral valve regurgitation. No evidence of mitral stenosis.  5. The aortic valve is tricuspid. Aortic valve regurgitation is trivial. No aortic stenosis is present.  6. The  inferior vena cava is normal in size with greater than 50% respiratory variability, suggesting right atrial pressure of 3 mmHg. Comparison(s): No prior Echocardiogram. FINDINGS  Left Ventricle: Left ventricular ejection fraction, by estimation, is 60 to 65%. The left ventricle has normal function. The left ventricle has no regional wall motion abnormalities. Strain was performed and the global longitudinal strain is indeterminate. The left ventricular internal cavity size was normal in size. There is no left ventricular hypertrophy. Left ventricular diastolic parameters were normal. Right Ventricle: The right ventricular size is normal. No increase in right ventricular wall thickness. Right ventricular systolic function is normal. There is normal pulmonary artery systolic pressure. The tricuspid regurgitant velocity is 2.21 m/s, and  with an assumed right atrial pressure of 3 mmHg, the estimated right ventricular systolic pressure is 22.5 mmHg. Left Atrium: Left atrial size was severely dilated. Right Atrium: Right atrial  size was normal in size. Pericardium: There is no evidence of pericardial effusion. Mitral Valve: The mitral valve is normal in structure. Trivial mitral valve regurgitation. No evidence of mitral valve stenosis. Tricuspid Valve: The tricuspid valve is normal in structure. Tricuspid valve regurgitation is mild . No evidence of tricuspid stenosis. Aortic Valve: The aortic valve is tricuspid. Aortic valve regurgitation is trivial. No aortic stenosis is present. Aortic valve mean gradient measures 5.0 mmHg. Aortic valve peak gradient measures 8.5 mmHg. Aortic valve area, by VTI measures 2.44 cm. Pulmonic Valve: The pulmonic valve was not well visualized. Pulmonic valve regurgitation is trivial. No evidence of pulmonic stenosis. Aorta: The aortic root is normal in size and structure. Venous: The inferior vena cava is normal in size with greater than 50% respiratory variability, suggesting right atrial pressure of 3 mmHg. IAS/Shunts: The atrial septum is grossly normal. Additional Comments: 3D was performed not requiring image post processing on an independent workstation and was indeterminate.  LEFT VENTRICLE PLAX 2D LVIDd:         4.10 cm   Diastology LVIDs:         2.50 cm   LV e' medial:    9.68 cm/s LV PW:         1.10 cm   LV E/e' medial:  8.6 LV IVS:        0.90 cm   LV e' lateral:   13.40 cm/s LVOT diam:     2.10 cm   LV E/e' lateral: 6.2 LV SV:         82 LV SV Index:   47 LVOT Area:     3.46 cm  RIGHT VENTRICLE             IVC RV Basal diam:  3.90 cm     IVC diam: 1.30 cm RV S prime:     13.40 cm/s TAPSE (M-mode): 2.3 cm LEFT ATRIUM              Index        RIGHT ATRIUM           Index LA diam:        3.60 cm  2.06 cm/m   RA Area:     18.20 cm LA Vol (A2C):   94.2 ml  53.99 ml/m  RA Volume:   48.20 ml  27.63 ml/m LA Vol (A4C):   112.0 ml 64.19 ml/m LA Biplane Vol: 104.0 ml 59.61 ml/m  AORTIC VALVE AV Area (Vmax):    2.54 cm AV Area (Vmean):   2.33 cm AV Area (VTI):  2.44 cm AV Vmax:            146.00 cm/s AV Vmean:          98.500 cm/s AV VTI:            0.335 m AV Peak Grad:      8.5 mmHg AV Mean Grad:      5.0 mmHg LVOT Vmax:         107.00 cm/s LVOT Vmean:        66.200 cm/s LVOT VTI:          0.236 m LVOT/AV VTI ratio: 0.70  AORTA Ao Root diam: 3.10 cm MITRAL VALVE               TRICUSPID VALVE MV Area (PHT): 4.60 cm    TR Peak grad:   19.5 mmHg MV Decel Time: 165 msec    TR Vmax:        221.00 cm/s MV E velocity: 83.70 cm/s MV A velocity: 75.50 cm/s  SHUNTS MV E/A ratio:  1.11        Systemic VTI:  0.24 m                            Systemic Diam: 2.10 cm Vishnu Priya Mallipeddi Electronically signed by Lucetta Russel Mallipeddi Signature Date/Time: 09/28/2023/2:50:58 PM    Final    EEG adult Result Date: 09/28/2023 Arleene Lack, MD     09/28/2023  1:30 PM Patient Name: SHAWNDREA RUTKOWSKI MRN: [id number] Epilepsy Attending: Arleene Lack Referring Physician/Provider: Kimberley Penman, MD Date: 09/28/2023 Duration: 22.26 mins Patient history: 68 year old female with an extensive prior history of epilepsy, but also hx of stroke, COPD, hyperlipidemia, neuropathy, OSA, dementia who presented to [facility] with AMS and speech difficulty and not answering questions. LSW 1550. Exam concerning for aphasia and right facial droop and right arm drift. EEG to evaluate for seizure Level of alertness: Awake, asleep AEDs during EEG study: CBZ, GBP, LCM, LTG, LEV, PHT, Ativan  Technical aspects: This EEG study was done with scalp electrodes positioned according to the 10-20 International system of electrode placement. Electrical activity was reviewed with band pass filter of 1-70Hz , sensitivity of 7 uV/mm, display speed of 53mm/sec with a 60Hz  notched filter applied as appropriate. EEG data were recorded continuously and digitally stored.  Video monitoring was available and reviewed as appropriate. Description: The posterior dominant rhythm consists of 8 Hz activity of moderate voltage (25-35 uV) seen predominantly in  posterior head regions, symmetric and reactive to eye opening and eye closing. Sleep was characterized by vertex waves, sleep spindles (12 to 14 Hz), maximal frontocentral region. EEG showed continuous high amplitude 3 to 6 Hz theta-delta slowing in right temporal region consistent with breach artifact. Hyperventilation and photic stimulation were not performed.   ABNORMALITY - Breach artifact, right temporal region IMPRESSION: This study is suggestive of cortical dysfunction arising from right temporal region consistent with underlying craniotomy. No seizures or epileptiform discharges were seen throughout the recording. Priyanka Suzanne Erps    Vitals:   09/29/23 0600 09/29/23 0700 09/29/23 0800 09/29/23 0900  BP: 133/65 129/71 106/79 117/61  Pulse: (!) 55 (!) 54 (!) 58 (!) 52  Resp: 14 10 11 12   Temp:   97.8 F (36.6 C)   TempSrc:   Axillary   SpO2: 97% 99% 99% 100%     PHYSICAL EXAM General: Frail elderly woman Psych:  Mood and affect  appropriate for situation CV: Regular rate and rhythm on monitor Respiratory:  Regular, unlabored respirations on room air GI: Abdomen soft and nontender   NEURO:  Mental Status: AA&O self and age Speech/Language: speech is without dysarthria.  Has some aphasia naming, repetition, fluency, and comprehension intact.  Cranial Nerves:  II: PERRL. Visual fields full.  III, IV, VI: EOMI. Eyelids elevate symmetrically.  V: Sensation is intact to light touch and symmetrical to face.  VII: Right facial droop VIII: hearing intact to voice. IX, X: Palate elevates symmetrically. Phonation is normal.  WU:[id number] shrug 5/5. XII: tongue is midline without fasciculations. Motor: Right arm with drift, left arm 5/5 in bilateral lower extremities 5/5 Tone: is normal and bulk is normal Sensation- Intact to light touch bilaterally. Extinction absent to light touch to DSS.   Coordination: FTN intact bilaterally, HKS: no ataxia in BLE.No drift.  Gait-  deferred   ASSESSMENT/PLAN  Ms. RENATHA ROSEN is a 68 y.o. female with history of extensive prior history of epilepsy, but also hx of stroke, COPD, hyperlipidemia, neuropathy, OSA, dementia who presented to [facility] with AMS and speech difficulty and not answering questions. LSW 1550. Exam concerning for aphasia and right facial droop and right arm drift, but pt did have some mild facial droop from previous stroke and right arm fracture per niece.  Received IV TNK [facility] on Admission 15  Strokelike episode likely seizure with postictal state:  s/p TNK Etiology: Possibly seizure medication noncompliance Code Stroke  CT head No acute abnormality. ASPECTS 10.    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  MRI ordered 24 hours CT scheduled for 1630 2D Echo EF 60 to 65%.  Left atrium severely dilated LDL 88 HgbA1c 4.4 VTE prophylaxis -SCDs clopidogrel  75 mg daily prior to admission, now on clopidogrel  75 mg daily Therapy recommendations:  Pending Disposition: Pending  Temporal lobe epilepsy History of stroke Sees Dr. Walden Guise at [facility] last seen on 4/16 for breakthrough seizures Continue LTM LTM 4/19 This study is suggestive of cortical dysfunction arising from right temporal region consistent with underlying craniotomy. No seizures or epileptiform discharges were seen throughout the recording. Home medications-continue while in hospital continue  Carbamazepine  150 mg twice daily. Continue Vimpat  150 mg twice daily. Continue Dilantin  XR 100 mg twice daily. Continue Lamictal  200 mg in the morning and 300 mg at night. Continue Keppra  500 mg in the morning and 1000 mg at night." LTM 4/20 This study showed evidence of epileptogenicity arising from LEFT frontal-anterior temporal region. Additionally there is cortical dysfunction arising from right temporal region likely secondary to underlying structural abnormality. No seizures  were seen throughout the recording  Will d/c LTM today   Hypertension Home meds: Losartan  50 mg Stable Blood Pressure Goal: BP less than 180/105   Hyperlipidemia Home meds: Rosuvastatin  5 mg, resumed in hospital LDL ordered goal < 70 Continue 5 mg Crestor  for now Continue statin at discharge  COPD Symbicort Home med Keep sats greater than 94%  GERD  Home Pepcid   Regular diet  Dementia Anxiety and depression On no home meds  Hyponatremia  NA 129  Saline at 40 cc will recheck in the morning    Leukopenia Anemia WBC 3.6, Hgb 11.5 will monitor   Dysphagia Patient has post-stroke dysphagia, SLP consulted    Diet   DIET DYS 3 Room service appropriate? Yes; Fluid consistency: Thin   Advance diet as tolerated  Other Stroke  Risk Factors Obstructive sleep apnea,    Other Active Problems Neuropathy  Hospital day # 1  Jonette Nestle DNP, ACNPC-AG  [facility]  I have personally obtained history,examined this patient, reviewed notes, independently viewed imaging studies, participated in medical decision making and plan of care.ROS completed by me personally and pertinent positives fully documented  I have made any additions or clarifications directly to the above note. Agree with note above.  Patient has a history of seizures and is on multiple seizure medications.  Is unclear if she has been compliant with her medicines.  Likely had an seizure with postictal state with focal deficits presented with strokelike episode.  Long-term EEG does show left temporal cortical irritability.  Resume home seizure medications.  Mobilize out of bed.  Therapy consults.  Transfer to neurology floor bed.  No family available bedside for discussion.  Greater than 50% time during this 50-minute visit was spent in counseling and coordination of care about her seizures and strokelike presentation and discussion with patient and care team answering questions  Ardella Beaver, MD Medical  Director [facility] Pager: [phone] 09/29/2023 2:47 PM   To contact Stroke Continuity provider, please refer to [email or web address]. After hours, contact General Neurology

## 2023-09-30 ENCOUNTER — Inpatient Hospital Stay (HOSPITAL_COMMUNITY)

## 2023-09-30 DIAGNOSIS — R4182 Altered mental status, unspecified: Secondary | ICD-10-CM | POA: Diagnosis not present

## 2023-09-30 DIAGNOSIS — R4701 Aphasia: Secondary | ICD-10-CM | POA: Diagnosis not present

## 2023-09-30 DIAGNOSIS — R29810 Facial weakness: Secondary | ICD-10-CM | POA: Diagnosis not present

## 2023-09-30 DIAGNOSIS — R2990 Unspecified symptoms and signs involving the nervous system: Principal | ICD-10-CM | POA: Insufficient documentation

## 2023-09-30 LAB — BASIC METABOLIC PANEL WITH GFR
Anion gap: 13 (ref 5–15)
BUN: 15 mg/dL (ref 8–23)
CO2: 18 mmol/L — ABNORMAL LOW (ref 22–32)
Calcium: 9.3 mg/dL (ref 8.9–10.3)
Chloride: 108 mmol/L (ref 98–111)
Creatinine, Ser: 0.87 mg/dL (ref 0.44–1.00)
GFR, Estimated: >60 mL/min (ref >60)
Glucose, Bld: 85 mg/dL (ref 70–99)
Potassium: 4.1 mmol/L (ref 3.5–5.1)
Sodium: 139 mmol/L (ref 135–145)

## 2023-09-30 MED ORDER — PANTOPRAZOLE SODIUM 40 MG PO TBEC
40.0000 mg | DELAYED_RELEASE_TABLET | Freq: Every day | ORAL | Status: DC
  Administered 2023-09-30: 40 mg via ORAL
  Filled 2023-09-30: qty 1

## 2023-09-30 MED ORDER — ROSUVASTATIN CALCIUM 20 MG PO TABS
20.0000 mg | ORAL_TABLET | Freq: Every day | ORAL | Status: DC
  Administered 2023-09-30: 20 mg via ORAL
  Filled 2023-09-30: qty 1

## 2023-09-30 MED ORDER — LORAZEPAM 1 MG PO TABS
1.0000 mg | ORAL_TABLET | Freq: Once | ORAL | Status: AC
  Administered 2023-09-30: 1 mg via ORAL
  Filled 2023-09-30: qty 1

## 2023-09-30 MED ORDER — LORAZEPAM 2 MG/ML IJ SOLN
1.0000 mg | Freq: Once | INTRAMUSCULAR | Status: DC
  Filled 2023-09-30: qty 1

## 2023-09-30 NOTE — Evaluation (Signed)
 Occupational Therapy Evaluation Patient Details Name: Marcia Brown MRN: [id number] DOB: 28-Feb-1956 Today's Date: 09/30/2023   History of Present Illness   Marcia Brown is a 68 y.o. female presented 4/18 to [facility] with AMS and speech difficulty and not answering questions. Transferred to [facility] for further workup. CTH negative for acute findings. MRI pending. EEG showed evidence of epileptogenicity arising from LEFT frontal-anterior temporal region. Additionally there is cortical dysfunction arising from right temporal region likely secondary to underlying structural abnormality. PMH of epilepsy, but also hx of stroke, COPD, hyperlipidemia, neuropathy, OSA, dementia.     Clinical Impressions PTA, per chart, pt from ALF where she inconsistently used RW for mobility, endorses several recent falls, and was supervision for ADL per chart review and pt report although unsure reliability of report at time of eval. Upon eval, pt with decreased safety awareness, strength, balance, and cognition. Unsure of baseline cognition given history of dementia, but pt benefits from cues for safety with RW and oriented to person only. Pt follows one step commands consistently during session. Believe pt likely near baseline. Will continue to follow. Recommending HHOT to optimize safety and independence in natural setting given pt with support from ALF at baseline.      If plan is discharge home, recommend the following:   A little help with walking and/or transfers;A little help with bathing/dressing/bathroom;Assistance with cooking/housework;Assist for transportation;Help with stairs or ramp for entrance     Functional Status Assessment   Patient has had a recent decline in their functional status and demonstrates the ability to make significant improvements in function in a reasonable and predictable amount of time.     Equipment Recommendations   None recommended by OT     Recommendations for Other  Services         Precautions/Restrictions   Precautions Precautions: Fall Recall of Precautions/Restrictions: Impaired Restrictions Weight Bearing Restrictions Per Provider Order: No     Mobility Bed Mobility Overal bed mobility: Needs Assistance Bed Mobility: Supine to Sit     Supine to sit: Contact guard     General bed mobility comments: increased time and assist for lines/redirecting attention away from lines    Transfers Overall transfer level: Needs assistance Equipment used: Rolling walker (2 wheels) Transfers: Sit to/from Stand Sit to Stand: Contact guard assist, Min assist           General transfer comment: Min A progresing to CGA on two additional attempts. poor safety with hand placement with attempts to pull up on RW      Balance Overall balance assessment: Needs assistance   Sitting balance-Leahy Scale: Fair     Standing balance support: Bilateral upper extremity supported, No upper extremity supported   Standing balance comment: benefits from RW                           ADL either performed or assessed with clinical judgement   ADL Overall ADL's : Needs assistance/impaired     Grooming: Wash/dry hands;Cueing for sequencing;Standing;Contact guard assist   Upper Body Bathing: Set up;Sitting   Lower Body Bathing: Minimal assistance;Sit to/from stand   Upper Body Dressing : Set up;Sitting   Lower Body Dressing: Contact guard assist;Sitting/lateral leans Lower Body Dressing Details (indicate cue type and reason): to don socks. Toilet Transfer: Contact guard assist;Ambulation;Rolling walker (2 wheels);Minimal assistance Toilet Transfer Details (indicate cue type and reason): intermittent minA for safety with RW use Toileting- Clothing Manipulation and  Hygiene: Set up;Sitting/lateral lean       Functional mobility during ADLs: Contact guard assist;Minimal assistance;Rolling walker (2 wheels);Cueing for safety        Vision Baseline Vision/History: 1 Wears glasses Patient Visual Report: Blurring of vision Additional Comments: Pt able to detect moving stimulus in all quads without difficulty, initially missed stimulus in R lower quad but on two additional attempts able to detect. Pt reports blurred vision as compared to baseline but also reports wearing glasses not present with her in room. pt would not attempt to read signage or OT name badge but was able to describe characters on television. Pt able to visually track stimulus in all quads without difficulty.     Perception Perception: Not tested       Praxis Praxis: Not tested       Pertinent Vitals/Pain Pain Assessment Pain Assessment: No/denies pain     Extremity/Trunk Assessment Upper Extremity Assessment Upper Extremity Assessment: Generalized weakness;RUE deficits/detail RUE Deficits / Details: R slightly weaker as compared to l   Lower Extremity Assessment Lower Extremity Assessment: Defer to PT evaluation       Communication Communication Communication: No apparent difficulties   Cognition Arousal: Alert Behavior During Therapy: WFL for tasks assessed/performed Cognition: No family/caregiver present to determine baseline, History of cognitive impairments             OT - Cognition Comments: dementia at baseline. oriented to person and birth date but not year of birth or current year. pt follows one step commands. Frequently responds to conversation with loosely related responses. Able to sequence through basic ADL but needs occasional reminders (i.e."would you like some soap" during washing hands)                 Following commands: Intact       Cueing  General Comments   Cueing Techniques: Verbal cues;Tactile cues  VSS   Exercises     Shoulder Instructions      Home Living Family/patient expects to be discharged to:: Assisted living ([facility])     Type of Home: Other(Comment) (ALF)                        Home Equipment: Other (comment) (unsure; pt unable to recall)   Additional Comments: lives at the [place] [facility] [address], reports intermittent assistance as needed, and that she hopes to return back to the people she's familiar with      Prior Functioning/Environment Prior Level of Function : Independent/Modified Independent;History of Falls (last six months)             Mobility Comments: reports intermittent use of RW at all times while at ALF, often tries to ambulate without AD. Multiple falls, has lost count over the past 6 months per patient reports. ADLs Comments: per patient reports IND with ADLs; but can have assistance if needed    OT Problem List: Decreased strength;Decreased activity tolerance;Impaired balance (sitting and/or standing);Decreased cognition;Decreased safety awareness;Decreased knowledge of use of DME or AE   OT Treatment/Interventions: Self-care/ADL training;Therapeutic exercise;DME and/or AE instruction;Balance training;Patient/family education;Cognitive remediation/compensation;Therapeutic activities      OT Goals(Current goals can be found in the care plan section)   Acute Rehab OT Goals Patient Stated Goal: go home OT Goal Formulation: With patient Time For Goal Achievement: 10/14/23 Potential to Achieve Goals: Good   OT Frequency:  Min 1X/week    Co-evaluation  AM-PAC OT "6 Clicks" Daily Activity     Outcome Measure Help from another person eating meals?: A Little Help from another person taking care of personal grooming?: A Little Help from another person toileting, which includes using toliet, bedpan, or urinal?: A Little Help from another person bathing (including washing, rinsing, drying)?: A Little Help from another person to put on and taking off regular upper body clothing?: A Little Help from another person to put on and taking off regular lower body clothing?: A Little 6 Click Score: 18    End of Session Equipment Utilized During Treatment: Gait belt;Rolling walker (2 wheels) Nurse Communication: Mobility status (pt up in chair, +1 A)  Activity Tolerance: Patient tolerated treatment well Patient left: in chair;with call bell/phone within reach;with chair alarm set  OT Visit Diagnosis: Unsteadiness on feet (R26.81);Muscle weakness (generalized) (M62.81);Other symptoms and signs involving cognitive function                Time: 4540-9811 OT Time Calculation (min): 24 min Charges:  OT General Charges $OT Visit: 1 Visit OT Evaluation $OT Eval Low Complexity: 1 Low OT Treatments $Self Care/Home Management : 8-22 mins  Karilyn Ouch, OTR/L [facility] Acute Rehabilitation Office: [phone]   Emery Hans 09/30/2023, 10:54 AM

## 2023-09-30 NOTE — Progress Notes (Signed)
 Physical Therapy Treatment Patient Details Name: Marcia Brown MRN: [id number] DOB: Nov 07, 1955 Today's Date: 09/30/2023   History of Present Illness Marcia Brown is a 68 y.o. female presented 4/18 to [facility] with AMS and speech difficulty and not answering questions. Transferred to [facility] for further workup. CTH negative for acute findings. MRI pending. EEG showed evidence of epileptogenicity arising from LEFT frontal-anterior temporal region. Additionally there is cortical dysfunction arising from right temporal region likely secondary to underlying structural abnormality. PMH of epilepsy, but also hx of stroke, COPD, hyperlipidemia, neuropathy, OSA, dementia.    PT Comments  Pt stating "no" when asked if she needed to use the restroom, however, when assisted onto toilet, was able to void. May benefit from toileting schedule with nursing staff. Pt ambulating limited hallway distances with CGA using RW. Pt is somewhat impulsive and frequently leaves walker behind when back in room and attempts to straighten out sheets with poor balance without external support. Will likely benefit from return to familiar ALF environment with HHPT to address deficits.   If plan is discharge home, recommend the following: A little help with walking and/or transfers;A little help with bathing/dressing/bathroom;Assistance with cooking/housework;Direct supervision/assist for medications management;Direct supervision/assist for financial management   Can travel by private vehicle     Yes  Equipment Recommendations  Rolling walker (2 wheels);BSC/3in1    Recommendations for Other Services       Precautions / Restrictions Precautions Precautions: Fall Recall of Precautions/Restrictions: Impaired Restrictions Weight Bearing Restrictions Per Provider Order: No     Mobility  Bed Mobility Overal bed mobility: Needs Assistance Bed Mobility: Sit to Supine     Supine to sit: Contact guard          Transfers Overall  transfer level: Needs assistance Equipment used: Rolling walker (2 wheels) Transfers: Sit to/from Stand Sit to Stand: Contact guard assist                Ambulation/Gait Ambulation/Gait assistance: Contact guard assist Gait Distance (Feet): 70 Feet Assistive device: Rolling walker (2 wheels) Gait Pattern/deviations: Step-through pattern, Decreased stride length       General Gait Details: Steady pace, cues for environmental/hallway navigation   Stairs             Wheelchair Mobility     Tilt Bed    Modified Rankin (Stroke Patients Only)       Balance Overall balance assessment: Needs assistance Sitting-balance support: Feet supported Sitting balance-Leahy Scale: Good     Standing balance support: No upper extremity supported, During functional activity Standing balance-Leahy Scale: Poor Standing balance comment: close CGA                            Communication Communication Communication: No apparent difficulties  Cognition Arousal: Alert Behavior During Therapy: WFL for tasks assessed/performed   PT - Cognitive impairments: History of cognitive impairments                         Following commands: Intact      Cueing Cueing Techniques: Verbal cues, Tactile cues  Exercises      General Comments General comments (skin integrity, edema, etc.): BP 98/57 (69)      Pertinent Vitals/Pain Pain Assessment Pain Assessment: Faces Faces Pain Scale: No hurt    Home Living  Prior Function            PT Goals (current goals can now be found in the care plan section) Acute Rehab PT Goals Patient Stated Goal: To get back to the [place] Potential to Achieve Goals: Good Progress towards PT goals: Progressing toward goals    Frequency    Min 2X/week      PT Plan      Co-evaluation              AM-PAC PT "6 Clicks" Mobility   Outcome Measure  Help needed turning from your  back to your side while in a flat bed without using bedrails?: A Little Help needed moving from lying on your back to sitting on the side of a flat bed without using bedrails?: A Little Help needed moving to and from a bed to a chair (including a wheelchair)?: A Little Help needed standing up from a chair using your arms (e.g., wheelchair or bedside chair)?: A Little Help needed to walk in hospital room?: A Little Help needed climbing 3-5 steps with a railing? : A Lot 6 Click Score: 17    End of Session Equipment Utilized During Treatment: Gait belt Activity Tolerance: Patient tolerated treatment well Patient left: in bed;with call bell/phone within reach;with bed alarm set   PT Visit Diagnosis: Unsteadiness on feet (R26.81);Muscle weakness (generalized) (M62.81)     Time: 1610-9604 PT Time Calculation (min) (ACUTE ONLY): 18 min  Charges:    $Therapeutic Activity: 8-22 mins PT General Charges $$ ACUTE PT VISIT: 1 Visit                     Verdia Glad, PT, DPT Acute Rehabilitation Services Office [phone]    Claria Crofts 09/30/2023, 4:23 PM

## 2023-09-30 NOTE — TOC Transition Note (Signed)
 Transition of Care [facility]) - Discharge Note   Patient Details  Name: Marcia Brown MRN: [id number] Date of Birth: 04-Mar-1956  [facility]) CM/SW Contact:  Eljay Lave M, RN Phone Number: 09/30/2023, 11:59 AM   Clinical Narrative:    Marcia Brown is a 68 y.o. female presented 4/18 to [facility] with AMS and speech difficulty and not answering questions. Transferred to [facility] for further workup. CTH negative for acute findings. MRI pending. EEG showed evidence of epileptogenicity arising from LEFT frontal-anterior temporal region. Additionally there is cortical dysfunction arising from right temporal region likely secondary to underlying structural abnormality.  Prior to admission, patient fairly independent and living at the [facility]; she states she uses walker intermittently at assisted living facility.  PT/OT recommending home health follow-up, and [facility].  Referral to [facility] with [facility] for continued therapies at ALF. MD states patient needs MRI today, and likely discharge back to facility tomorrow with continued PT/OT.  Facility can transport if arrangements made prior to 3 PM.   Final next level of care: Assisted Living Barriers to Discharge: Barriers Resolved   Patient Goals and CMS Choice   CMS Medicare.gov Compare Post Acute Care list provided to:: Patient                              Discharge Plan and Services Additional resources added to the After Visit Summary for     Discharge Planning Services: CM Consult                        [facility] Agency: [facility] Date [facility] Agency Contacted: 09/30/23 Time HH Agency Contacted: 1159 Representative spoke with at [facility] Agency: Imelda Man  Social Drivers of Health (SDOH) Interventions SDOH Screenings   Food Insecurity: No Food Insecurity (09/28/2023)  Housing: Unknown (09/28/2023)  Transportation Needs: No Transportation Needs (11/13/2022)  Utilities: Patient Unable To  Answer (09/28/2023)  Tobacco Use: High Risk (09/27/2023)     Readmission Risk Interventions     No data to display         Calla Catchings, RN, BSN  Trauma/Neuro ICU Case Manager [phone]

## 2023-09-30 NOTE — Discharge Summary (Incomplete)
 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   Right wrist fracture, closed, initial encounter 11/12/2022   COPD (chronic obstructive pulmonary disease) (HCC) 11/12/2022   Anxiety 11/12/2022   Sleep apnea 11/12/2022   Dizzy spells 11/12/2022   Wrist fracture, closed, right, initial encounter 11/12/2022   Seizures (HCC) 08/04/2019   Acute lower UTI 06/26/2019   Hypokalemia 06/26/2019   AMS (altered mental status) 06/26/2019   Tobacco abuse 12/27/2015   Repeated falls 05/24/2015   Seizure (HCC) 02/03/2015   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     Allergies as of 10/01/2023       Reactions   Ciprofloxacin Other (See Comments)   Other Reaction: Lowered seizure threshold   Aspirin Other (See Comments)   Reaction: Unknown        Medication List     STOP taking these medications    celecoxib  100 MG capsule Commonly known as: CELEBREX    diphenhydrAMINE  25 MG tablet Commonly known as: BENADRYL    DULoxetine  HCl 40 MG Cpep   guaiFENesin  100 MG/5ML liquid Commonly known as: ROBITUSSIN       TAKE these medications    acetaminophen  325 MG tablet Commonly known as: TYLENOL  Take 650 mg by mouth in the morning, at noon, and at bedtime. Take 650 mg by mouth 3 (three) times daily GIVEN WITH TRAMADOL  0800/1400/2000   albuterol  108 (90 Base) MCG/ACT inhaler Commonly known as: VENTOLIN  HFA Inhale 2 puffs  into the lungs every 4 (four) hours as needed for wheezing or shortness of breath.   alum & mag hydroxide-simeth 200-200-20 MG/5ML suspension Commonly known as: MAALOX/MYLANTA Take 30 mLs by mouth every 6 (six) hours as needed for indigestion or heartburn.   azelastine 0.05 % ophthalmic solution Commonly known as: OPTIVAR Place 1 drop into both eyes 2 (two) times daily.   budesonide-formoterol  160-4.5 MCG/ACT inhaler Commonly known as: SYMBICORT Inhale 2 puffs into the lungs 2 (two) times daily.   carbamazepine  100 MG chewable tablet Commonly known as: TEGRETOL  Chew 1.5 tablets (150 mg total) by mouth 2 (two) times daily. What changed: when to take this   cholecalciferol  10 MCG (400 UNIT) Tabs tablet Commonly known as: VITAMIN D3 Take 400 Units by mouth daily.   clopidogrel  75 MG tablet Commonly known as: PLAVIX  Take 75 mg by mouth daily.   cyanocobalamin  1000 MCG tablet Take 1 tablet (1,000 mcg total) by mouth daily.   docusate sodium  50 MG capsule Commonly known as: COLACE Take 50 mg by mouth daily.   gabapentin  100 MG capsule Commonly known as: NEURONTIN  Take 1 capsule (100 mg total) by mouth 2 (two) times daily. What changed:  how much to take when to take this   hydrocortisone cream 1 % Apply 1 Application topically daily as needed (skin rash/ irritation).   lamoTRIgine  150 MG tablet Commonly known as: LAMICTAL  Take 2 tablets (300 mg total) by mouth  at bedtime. What changed:  medication strength how much to take when to take this additional instructions   lamoTRIgine  200 MG tablet Commonly known as: LAMICTAL  Take 1 tablet (200 mg total) by mouth in the morning. Start taking on: October 02, 2023 What changed: You were already taking a medication with the same name, and this prescription was added. Make sure you understand how and when to take each.   levETIRAcetam  1000 MG tablet Commonly known as: KEPPRA  Take 1 tablet (1,000 mg total) by mouth at  bedtime. What changed:  medication strength how much to take when to take this additional instructions   levETIRAcetam  500 MG tablet Commonly known as: KEPPRA  Take 1 tablet (500 mg total) by mouth every morning. Start taking on: October 02, 2023 What changed: You were already taking a medication with the same name, and this prescription was added. Make sure you understand how and when to take each.   loperamide  2 MG capsule Commonly known as: IMODIUM  Take 4 mg by mouth as needed for diarrhea or loose stools.   loratadine  10 MG tablet Commonly known as: CLARITIN  Take 10 mg by mouth daily.   losartan  50 MG tablet Commonly known as: COZAAR  Take 50 mg by mouth daily.   magnesium  hydroxide 400 MG/5ML suspension Commonly known as: MILK OF MAGNESIA Take 30 mLs by mouth daily as needed for mild constipation or moderate constipation.   multivitamin with minerals Tabs tablet Take 1 tablet by mouth daily.   pantoprazole  20 MG tablet Commonly known as: PROTONIX  Take 20 mg by mouth at bedtime.   phenytoin  100 MG ER capsule Commonly known as: DILANTIN  Take 100 mg by mouth 2 (two) times daily.   QUEtiapine  25 MG tablet Commonly known as: SEROQUEL  Take 25 mg by mouth at bedtime.   rosuvastatin  20 MG tablet Commonly known as: CRESTOR  Take 1 tablet (20 mg total) by mouth daily at 8 pm. What changed:  medication strength how much to take   sodium chloride  0.65 % nasal spray Commonly known as: OCEAN Place 2 sprays into the nose 2 (two) times daily as needed (dry nose/epitaxis). 2 Spray(s) Both Nares Twice Daily PRN for dry nose/epistaxis   sucralfate  1 g tablet Commonly known as: CARAFATE  Take 1 g by mouth 2 (two) times daily. Take on empty stomach   Vimpat  150 MG Tabs Generic drug: Lacosamide  Take 1 tablet by mouth 2 (two) times daily.        LABORATORY STUDIES CBC    Component Value Date/Time   WBC 3.2 (L) 10/01/2023 0559   RBC 3.18 (L) 10/01/2023 0559   HGB 11.0  (L) 10/01/2023 0559   HGB 12.4 06/13/2014 1433   HCT 32.5 (L) 10/01/2023 0559   HCT 37.4 06/13/2014 1433   PLT 183 10/01/2023 0559   PLT 184 06/13/2014 1433   MCV 102.2 (H) 10/01/2023 0559   MCV 104 (H) 06/13/2014 1433   MCH 34.6 (H) 10/01/2023 0559   MCHC 33.8 10/01/2023 0559   RDW 12.1 10/01/2023 0559   RDW 14.2 06/13/2014 1433   LYMPHSABS 1.5 09/27/2023 1630   LYMPHSABS 1.5 10/09/2012 0922   MONOABS 0.4 09/27/2023 1630   MONOABS 0.6 10/09/2012 0922   EOSABS 0.1 09/27/2023 1630   EOSABS 0.1 10/09/2012 0922   BASOSABS 0.0 09/27/2023 1630   BASOSABS 0.0 10/09/2012 0922   CMP    Component Value Date/Time   NA 139 10/01/2023 0559   NA 142 06/13/2014 1433   K 3.4 (L) 10/01/2023 0559  K 4.2 06/13/2014 1433   CL 105 10/01/2023 0559   CL 107 06/13/2014 1433   CO2 24 10/01/2023 0559   CO2 27 06/13/2014 1433   GLUCOSE 82 10/01/2023 0559   GLUCOSE 82 06/13/2014 1433   BUN 11 10/01/2023 0559   BUN 9 06/13/2014 1433   CREATININE 0.87 10/01/2023 0559   CREATININE 0.77 06/13/2014 1433   CALCIUM  9.2 10/01/2023 0559   CALCIUM  8.4 (L) 06/13/2014 1433   PROT 6.3 (L) 09/27/2023 1630   PROT 7.1 12/20/2013 2118   ALBUMIN 3.9 09/27/2023 1630   ALBUMIN 3.7 12/20/2013 2118   AST 25 09/27/2023 1630   AST 32 12/20/2013 2118   ALT 23 09/27/2023 1630   ALT 38 12/20/2013 2118   ALKPHOS 66 09/27/2023 1630   ALKPHOS 62 12/20/2013 2118   BILITOT 0.5 09/27/2023 1630   BILITOT 0.3 12/20/2013 2118   GFRNONAA >60 10/01/2023 0559   GFRNONAA >60 06/13/2014 1433   GFRNONAA >60 02/27/2014 1939   GFRAA >60 11/27/2019 2103   GFRAA >60 06/13/2014 1433   GFRAA >60 02/27/2014 1939   COAGS Lab Results  Component Value Date   INR 1.0 09/27/2023   INR 1.2 09/08/2022   INR 0.98 11/14/2016   Lipid Panel    Component Value Date/Time   CHOL 179 09/29/2023 0655   TRIG 106 09/29/2023 0655   HDL 70 09/29/2023 0655   CHOLHDL 2.6 09/29/2023 0655   VLDL 21 09/29/2023 0655   LDLCALC 88 09/29/2023  0655   HgbA1C  Lab Results  Component Value Date   HGBA1C 4.4 (L) 09/28/2023   Alcohol  Level    Component Value Date/Time   ETH <10 09/27/2023 1630     SIGNIFICANT DIAGNOSTIC STUDIES MR BRAIN WO CONTRAST Result Date: 09/30/2023 CLINICAL DATA:  Stroke follow-up EXAM: MRI HEAD WITHOUT CONTRAST TECHNIQUE: Multiplanar, multiecho pulse sequences of the brain and surrounding structures were obtained without intravenous contrast. COMPARISON:  06/07/2019 FINDINGS: Brain: No acute infarct, mass effect or extra-axial collection. No acute or chronic hemorrhage. There is multifocal hyperintense T2-weighted signal within the white matter. Parenchymal volume and CSF spaces are normal. Anterior right temporal lobe encephalomalacia, unchanged. The midline structures are normal. Vascular: Normal flow voids. Skull and upper cervical spine: Normal calvarium and skull base. Visualized upper cervical spine and soft tissues are normal. Sinuses/Orbits:No paranasal sinus fluid levels or advanced mucosal thickening. No mastoid or middle ear effusion. Normal orbits. IMPRESSION: 1. No acute intracranial abnormality. 2. Anterior right temporal lobe encephalomalacia, unchanged. 3. Mild chronic small vessel ischemia. Electronically Signed   By: Juanetta Nordmann M.D.   On: 09/30/2023 22:33   Overnight EEG with video Result Date: 09/29/2023 Arleene Lack, MD     09/29/2023 11:22 AM Patient Name: KIYLAH LOYER MRN: [id number] Epilepsy Attending: Arleene Lack Referring Physician/Provider: Kimberley Penman, MD Duration: 09/28/2023 5621 to 09/29/2023 0950  Patient history: 68 year old female with an extensive prior history of epilepsy, but also hx of stroke, COPD, hyperlipidemia, neuropathy, OSA, dementia who presented to [facility] with AMS and speech difficulty and not answering questions. LSW 1550. Exam concerning for aphasia and right facial droop and right arm drift. EEG to evaluate for seizure  Level of alertness: Awake, asleep  AEDs  during EEG study: CBZ, GBP, LCM, LTG, LEV, PHT, Ativan   Technical aspects: This EEG study was done with scalp electrodes positioned according to the 10-20 International system of electrode placement. Electrical activity was reviewed with band pass filter of 1-70Hz , sensitivity of 7  uV/mm, display speed of 80mm/sec with a 60Hz  notched filter applied as appropriate. EEG data were recorded continuously and digitally stored.  Video monitoring was available and reviewed as appropriate.  Description: The posterior dominant rhythm consists of 8 Hz activity of moderate voltage (25-35 uV) seen predominantly in posterior head regions, symmetric and reactive to eye opening and eye closing. Sleep was characterized by vertex waves, sleep spindles (12 to 14 Hz), maximal frontocentral region. EEG showed continuous high amplitude 3 to 6 Hz theta-delta slowing in right temporal region consistent with breach artifact. Spikes were noted in left frontal-anterior temporal region, predominantly during sleep. Hyperventilation and photic stimulation were not performed.    ABNORMALITY - Spike, LEFT frontal-anterior temporal region - Breach artifact, right temporal region  IMPRESSION: This study showed evidence of epileptogenicity arising from LEFT frontal-anterior temporal region. Additionally there is cortical dysfunction arising from right temporal region likely secondary to underlying structural abnormality. No seizures were seen throughout the recording.  Priyanka Suzanne Erps   CT HEAD WO CONTRAST ( ) Result Date: 09/28/2023 CLINICAL DATA:  Stroke, follow up EXAM: CT HEAD WITHOUT CONTRAST TECHNIQUE: Contiguous axial images were obtained from the base of the skull through the vertex without intravenous contrast. RADIATION DOSE REDUCTION: This exam was performed according to the departmental dose-optimization program which includes automated exposure control, adjustment of the mA and/or kV according to patient size and/or use of  iterative reconstruction technique. COMPARISON:  CT head April 18, 25. FINDINGS: Limited by artifact from the patient's hair implants. Within this limitation: Brain: No evidence of acute large vascular territory infarction, hemorrhage, hydrocephalus, extra-axial collection or mass lesion/mass effect. Vascular: No visible hyperdense vessel. Skull: No acute fracture. Sinuses/Orbits: Clear sinuses. No acute orbital findings. Remote facial fractures. IMPRESSION: Significantly limited study without evidence of acute intracranial abnormality. Please see forthcoming/ordered MRI for further evaluation. Electronically Signed   By: Stevenson Elbe M.D.   On: 09/28/2023 20:11   ECHOCARDIOGRAM COMPLETE Result Date: 09/28/2023    ECHOCARDIOGRAM REPORT   Patient Name:   KADY TOOTHAKER Date of Exam: 09/28/2023 Medical Rec #:  [id number]    Height:       64.0 in Accession #:    [id number]   Weight:       152.8 lb Date of Birth:  20-Mar-1956    BSA:          1.745 m Patient Age:    38 years     BP:           110/88 mmHg Patient Gender: F            HR:           56 bpm. Exam Location:  Inpatient Procedure: 2D Echo, Cardiac Doppler and Color Doppler (Both Spectral and Color            Flow Doppler were utilized during procedure). Indications:    Stroke  History:        Patient has no prior history of Echocardiogram examinations.                 Risk Factors:Current Smoker.  Sonographer:    Reta Cassis Referring Phys: [phone] ERIC LINDZEN IMPRESSIONS  1. Left ventricular ejection fraction, by estimation, is 60 to 65%. The left ventricle has normal function. The left ventricle has no regional wall motion abnormalities. Left ventricular diastolic parameters were normal.  2. Right ventricular systolic function is normal. The right ventricular size is normal. There is normal pulmonary artery systolic  pressure.  3. Left atrial size was severely dilated.  4. The mitral valve is normal in structure. Trivial mitral valve regurgitation. No  evidence of mitral stenosis.  5. The aortic valve is tricuspid. Aortic valve regurgitation is trivial. No aortic stenosis is present.  6. The inferior vena cava is normal in size with greater than 50% respiratory variability, suggesting right atrial pressure of 3 mmHg. Comparison(s): No prior Echocardiogram. FINDINGS  Left Ventricle: Left ventricular ejection fraction, by estimation, is 60 to 65%. The left ventricle has normal function. The left ventricle has no regional wall motion abnormalities. Strain was performed and the global longitudinal strain is indeterminate. The left ventricular internal cavity size was normal in size. There is no left ventricular hypertrophy. Left ventricular diastolic parameters were normal. Right Ventricle: The right ventricular size is normal. No increase in right ventricular wall thickness. Right ventricular systolic function is normal. There is normal pulmonary artery systolic pressure. The tricuspid regurgitant velocity is 2.21 m/s, and  with an assumed right atrial pressure of 3 mmHg, the estimated right ventricular systolic pressure is 22.5 mmHg. Left Atrium: Left atrial size was severely dilated. Right Atrium: Right atrial size was normal in size. Pericardium: There is no evidence of pericardial effusion. Mitral Valve: The mitral valve is normal in structure. Trivial mitral valve regurgitation. No evidence of mitral valve stenosis. Tricuspid Valve: The tricuspid valve is normal in structure. Tricuspid valve regurgitation is mild . No evidence of tricuspid stenosis. Aortic Valve: The aortic valve is tricuspid. Aortic valve regurgitation is trivial. No aortic stenosis is present. Aortic valve mean gradient measures 5.0 mmHg. Aortic valve peak gradient measures 8.5 mmHg. Aortic valve area, by VTI measures 2.44 cm. Pulmonic Valve: The pulmonic valve was not well visualized. Pulmonic valve regurgitation is trivial. No evidence of pulmonic stenosis. Aorta: The aortic root is normal  in size and structure. Venous: The inferior vena cava is normal in size with greater than 50% respiratory variability, suggesting right atrial pressure of 3 mmHg. IAS/Shunts: The atrial septum is grossly normal. Additional Comments: 3D was performed not requiring image post processing on an independent workstation and was indeterminate.  LEFT VENTRICLE PLAX 2D LVIDd:         4.10 cm   Diastology LVIDs:         2.50 cm   LV e' medial:    9.68 cm/s LV PW:         1.10 cm   LV E/e' medial:  8.6 LV IVS:        0.90 cm   LV e' lateral:   13.40 cm/s LVOT diam:     2.10 cm   LV E/e' lateral: 6.2 LV SV:         82 LV SV Index:   47 LVOT Area:     3.46 cm  RIGHT VENTRICLE             IVC RV Basal diam:  3.90 cm     IVC diam: 1.30 cm RV S prime:     13.40 cm/s TAPSE (M-mode): 2.3 cm LEFT ATRIUM              Index        RIGHT ATRIUM           Index LA diam:        3.60 cm  2.06 cm/m   RA Area:     18.20 cm LA Vol (A2C):   94.2 ml  53.99 ml/m  RA  Volume:   48.20 ml  27.63 ml/m LA Vol (A4C):   112.0 ml 64.19 ml/m LA Biplane Vol: 104.0 ml 59.61 ml/m  AORTIC VALVE AV Area (Vmax):    2.54 cm AV Area (Vmean):   2.33 cm AV Area (VTI):     2.44 cm AV Vmax:           146.00 cm/s AV Vmean:          98.500 cm/s AV VTI:            0.335 m AV Peak Grad:      8.5 mmHg AV Mean Grad:      5.0 mmHg LVOT Vmax:         107.00 cm/s LVOT Vmean:        66.200 cm/s LVOT VTI:          0.236 m LVOT/AV VTI ratio: 0.70  AORTA Ao Root diam: 3.10 cm MITRAL VALVE               TRICUSPID VALVE MV Area (PHT): 4.60 cm    TR Peak grad:   19.5 mmHg MV Decel Time: 165 msec    TR Vmax:        221.00 cm/s MV E velocity: 83.70 cm/s MV A velocity: 75.50 cm/s  SHUNTS MV E/A ratio:  1.11        Systemic VTI:  0.24 m                            Systemic Diam: 2.10 cm Vishnu Priya Mallipeddi Electronically signed by Lucetta Russel Mallipeddi Signature Date/Time: 09/28/2023/2:50:58 PM    Final    EEG adult Result Date: 09/28/2023 Arleene Lack, MD      09/28/2023  1:30 PM Patient Name: DANNETTE KINKAID MRN: [id number] Epilepsy Attending: Arleene Lack Referring Physician/Provider: Kimberley Penman, MD Date: 09/28/2023 Duration: 22.26 mins Patient history: 68 year old female with an extensive prior history of epilepsy, but also hx of stroke, COPD, hyperlipidemia, neuropathy, OSA, dementia who presented to [facility] with AMS and speech difficulty and not answering questions. LSW 1550. Exam concerning for aphasia and right facial droop and right arm drift. EEG to evaluate for seizure Level of alertness: Awake, asleep AEDs during EEG study: CBZ, GBP, LCM, LTG, LEV, PHT, Ativan  Technical aspects: This EEG study was done with scalp electrodes positioned according to the 10-20 International system of electrode placement. Electrical activity was reviewed with band pass filter of 1-70Hz , sensitivity of 7 uV/mm, display speed of 15mm/sec with a 60Hz  notched filter applied as appropriate. EEG data were recorded continuously and digitally stored.  Video monitoring was available and reviewed as appropriate. Description: The posterior dominant rhythm consists of 8 Hz activity of moderate voltage (25-35 uV) seen predominantly in posterior head regions, symmetric and reactive to eye opening and eye closing. Sleep was characterized by vertex waves, sleep spindles (12 to 14 Hz), maximal frontocentral region. EEG showed continuous high amplitude 3 to 6 Hz theta-delta slowing in right temporal region consistent with breach artifact. Hyperventilation and photic stimulation were not performed.   ABNORMALITY - Breach artifact, right temporal region IMPRESSION: This study is suggestive of cortical dysfunction arising from right temporal region consistent with underlying craniotomy. No seizures or epileptiform discharges were seen throughout the recording. Priyanka Suzanne Erps   CT ANGIO HEAD NECK W WO CM (CODE STROKE) Result Date: 09/27/2023 CLINICAL DATA:  Neuro deficit, concern for stroke. EXAM: CT  ANGIOGRAPHY HEAD AND NECK WITH AND WITHOUT CONTRAST TECHNIQUE: Multidetector CT imaging of the head and neck was performed using the standard protocol during bolus administration of intravenous contrast. Multiplanar CT image reconstructions and MIPs were obtained to evaluate the vascular anatomy. Carotid stenosis measurements (when applicable) are obtained utilizing NASCET criteria, using the distal internal carotid diameter as the denominator. RADIATION DOSE REDUCTION: This exam was performed according to the departmental dose-optimization program which includes automated exposure control, adjustment of the mA and/or kV according to patient size and/or use of iterative reconstruction technique. CONTRAST:  75mL OMNIPAQUE  IOHEXOL  350 MG/ML SOLN COMPARISON:  Same day CT head. FINDINGS: CTA NECK FINDINGS Aortic arch: Four vessel configuration of the aortic arch. Imaged portion shows no evidence of aneurysm or dissection. Mild atherosclerosis of the aortic arch. No significant stenosis of the major arch vessel origins. Pulmonary arteries: As permitted by contrast timing, there are no filling defects in the visualized pulmonary arteries. Subclavian arteries: Patent bilaterally. Prominent atherosclerotic plaque involving the proximal right subclavian artery along with focal tortuosity of the vessel resulting in moderate stenosis. Mild atherosclerosis at the left subclavian artery origin without significant stenosis. Right carotid system: Patent from the origin to the skull base. Tortuosity of the proximal common carotid artery. Mild atherosclerosis at the carotid bifurcation without hemodynamically significant stenosis. Mild tortuosity of the mid cervical ICA. No evidence of dissection. Left carotid system: Patent from the origin to the skull base. Mild atherosclerosis at the carotid bifurcation without hemodynamically significant stenosis. Retropharyngeal course of the proximal cervical ICA. No evidence of dissection.  Vertebral arteries: Codominant. Direct origin of the left vertebral artery on the aortic arch. The vertebral arteries are patent from the origins to the vertebrobasilar confluence. Mild tortuosity of the right V1 segment. No evidence of dissection. Distal tapering of the left V4 segment with mild stenosis just proximal to the vertebrobasilar confluence. Skeleton: No acute or aggressive finding. Postsurgical changes of the right temporal calvarium. Chronic deformity of the right zygomatic arch. Degenerative changes of the cervical spine with significant disc space narrowing at multiple levels. Other neck: The visualized airway is patent. No cervical lymphadenopathy. Upper chest: Visualized lung apices are clear. Review of the MIP images confirms the above findings CTA HEAD FINDINGS ANTERIOR CIRCULATION: The intracranial ICAs are patent bilaterally. Mild atherosclerosis of the carotid siphons without significant stenosis. No significant stenosis, proximal occlusion, aneurysm, or vascular malformation. MCAs: The middle cerebral arteries are patent bilaterally. ACAs: The anterior cerebral arteries are patent bilaterally. POSTERIOR CIRCULATION: No significant stenosis, proximal occlusion, aneurysm, or vascular malformation. PCAs: Patent bilaterally.  Fetal origin of the right PCA. Pcomm: Visualized on the right. SCAs: The superior cerebellar arteries are patent bilaterally. Basilar artery: Patent AICAs: Not well visualized. PICAs: Patent Vertebral arteries: The intracranial vertebral arteries are patent. Venous sinuses: As permitted by contrast timing, patent. Anatomic variants: As above. Review of the MIP images confirms the above findings IMPRESSION: No large vessel occlusion. No high-grade stenosis, aneurysm, or vascular malformation of the arteries in the head and neck. Mild atherosclerosis as above. Atherosclerosis involving the bilateral carotid bifurcations without hemodynamically significant stenosis. Focal  atherosclerosis along the proximal right subclavian artery resulting in moderate stenosis. Aortic Atherosclerosis (ICD10-I70.0). Electronically Signed   By: Denny Flack M.D.   On: 09/27/2023 17:14   CT HEAD CODE STROKE WO CONTRAST Result Date: 09/27/2023 CLINICAL DATA:  Code stroke. Neuro deficit, acute, stroke suspected. EXAM: CT HEAD WITHOUT CONTRAST TECHNIQUE: Contiguous axial images were obtained from the base of  the skull through the vertex without intravenous contrast. RADIATION DOSE REDUCTION: This exam was performed according to the departmental dose-optimization program which includes automated exposure control, adjustment of the mA and/or kV according to patient size and/or use of iterative reconstruction technique. COMPARISON:  Head CT 07/27/2023. FINDINGS: Brain: No acute hemorrhage. Unchanged encephalomalacia in the anterior right temporal lobe, likely postoperative. Gray-white differentiation is otherwise preserved. No hydrocephalus or extra-axial collection. No mass effect or midline shift. Vascular: No hyperdense vessel or unexpected calcification. Skull: Prior right pterional craniotomy. Sinuses/Orbits: No acute finding. Other: None. ASPECTS [facility] Stroke Program Early CT Score) - Ganglionic level infarction (caudate, lentiform nuclei, internal capsule, insula, M1-M3 cortex): 7 - Supraganglionic infarction (M4-M6 cortex): 3 Total score (0-10 with 10 being normal): 10 IMPRESSION: 1. No acute intracranial hemorrhage or evidence of acute large vessel territory infarct. ASPECT score is 10. 2. Unchanged encephalomalacia in the anterior right temporal lobe, likely postoperative. Code stroke imaging results were communicated on 09/27/2023 at 4:50 pm to provider Dr. Alejo Amsler via telephone, who verbally acknowledged these results. Electronically Signed   By: Audra Blend M.D.   On: 09/27/2023 16:52       HISTORY OF PRESENT ILLNESS 68 y.o. female with history of extensive prior history of  epilepsy, but also hx of stroke, COPD, hyperlipidemia, neuropathy, OSA, dementia who presented to [facility] with AMS and speech difficulty and not answering questions. LSW 1550. Exam concerning for aphasia and right facial droop and right arm drift, but pt did have some mild facial droop from previous stroke and right arm fracture per niece.  Received IV TNK NIH on Admission 15   HOSPITAL COURSE Strokelike episode likely seizure with postictal state:  s/p TNK Etiology: Possibly seizure medication noncompliance Code Stroke  CT head No acute abnormality. ASPECTS 10.    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  MRI   No acute intracranial abnormality. Anterior right temporal lobe encephalomalacia, unchanged. Mild chronic small vessel ischemia. 24 hours CT:  Significantly limited study without evidence of acute intracranial abnormality 2D Echo EF 60 to 65%.  Left atrium severely dilated LDL 88 HgbA1c 4.4 VTE prophylaxis -SCDs clopidogrel  75 mg daily prior to admission, continue clopidogrel  75 mg daily Therapy recommendations:  Return to SNF with [facility] PT/OT Disposition: Discharge today    Temporal lobe epilepsy History of stroke Sees Dr. Walden Guise at [facility] last seen on 4/16 for breakthrough seizures LTM 4/19: suggestive of cortical dysfunction arising from right temporal region consistent with underlying craniotomy. No seizures or epileptiform discharges were seen throughout the recording. LTM 4/20: evidence of epileptogenicity arising from LEFT frontal-anterior temporal region. Additionally there is cortical dysfunction arising from right temporal region likely secondary to underlying structural abnormality. No seizures were seen throughout the recording  Home medications-continue on discharge Carbamazepine  150 mg twice daily. Continue Vimpat  150 mg twice daily. Continue Dilantin  XR 100 mg twice daily. Continue Lamictal  200  mg in the morning and 300 mg at night. Continue Keppra  500 mg in the morning and 1000 mg at night. Facility called and compliance importance discussed  Hypertension Home meds: Losartan  50 mg Stable Blood Pressure Goal: BP less than 180/105 , gradually normalize on discharge   Hyperlipidemia Home meds: Rosuvastatin  5 mg, resumed in hospital LDL 83, goal < 70 Continue 20mg  Crestor  Continue statin at discharge   COPD Symbicort Home med Keep sats greater than 94%   GERD  Home Pepcid   Regular diet  Dementia Anxiety and depression On no home meds   Leukopenia Anemia WBC 3.6, Hgb 11.5 will monitor   Dysphagia Patient has post-stroke dysphagia, SLP consulted      Diet    DIET DYS 3 Room service appropriate? Yes; Fluid consistency: Thin        Advance diet as tolerated   Other Stroke Risk Factors Obstructive sleep apnea,     Other Active Problems Neuropathy  DISCHARGE EXAM  PHYSICAL EXAM General: Frail elderly woman Psych:  Mood and affect appropriate for situation CV: Regular rate and rhythm on monitor Respiratory:  Regular, unlabored respirations on room air GI: Abdomen soft and nontender   NEURO:  Mental Status: Oriented to self and age. Confused to place, time, can not give history (dementia at baseline)  Speech/Language: speech is without dysarthria. Mild aphasia.Aaron Aas Poor attention and concentration with decreased short-term memory and recall.    Cranial Nerves:  II: PERRL. Visual fields full.  III, IV, VI: EOMI. Eyelids elevate symmetrically.  V: Sensation is intact to light touch and symmetrical to face.  VII: Right facial droop VIII: hearing intact to voice. IX, X: Palate elevates symmetrically. Phonation is normal.  [id number] shrug 5/5. XII: tongue is midline without fasciculations. Motor:  Right arm with drift, left arm 5/5 in bilateral lower extremities 5/5 No drift or focal weakness.  Tone: is normal and bulk is normal Sensation- Intact  to light touch bilaterally. Coordination: FTN intact bilaterally, HKS: no ataxia in BLE.No drift.  Gait- deferred  NIH on Discharge: 3  Discharge Diet       Diet   DIET DYS 3 Room service appropriate? Yes; Fluid consistency: Thin   liquids  DISCHARGE PLAN Disposition: Skilled nursing facility clopidogrel  75 mg daily for secondary stroke prevention  Ongoing stroke risk factor control by Primary Care Physician at time of discharge Continue home seizure medications on discharge: Carbamazepine  150 mg twice daily. Continue Vimpat  150 mg twice daily. Continue Dilantin  XR 100 mg twice daily. Continue Lamictal  200 mg in the morning and 300 mg at night. Continue Keppra  500 mg in the morning and 1000 mg at night Follow-up PCP [facility] in 2 weeks. Follow-up in [facility] in 8 weeks, office to schedule an appointment. Able to see NP in clinic.  35 minutes were spent preparing discharge.  Pt seen by Neuro NP/APP and later by MD. Note/plan to be edited by MD as needed.    Audrene Lease, DNP, AGACNP-BC [facility] Please use AMION for contact information & EPIC for messaging. I have personally obtained history,examined this patient, reviewed notes, independently viewed imaging studies, participated in medical decision making and plan of care.ROS completed by me personally and pertinent positives fully documented  I have made any additions or clarifications directly to the above note. Agree with note above.    Ardella Beaver, MD Medical Director [facility] Pager: [phone] 10/01/2023 11:58 AM

## 2023-09-30 NOTE — Progress Notes (Addendum)
 STROKE TEAM PROGRESS NOTE    SIGNIFICANT HOSPITAL EVENTS 4/19 presented speech difficulty and altered mental status, right facial droop and right arm weakness.  She was given TNK  INTERIM HISTORY/SUBJECTIVE  No family at bedside.  RN at bedside. Patient is sitting up in bed, NAD, mental status continues to improve.  She continues to follow commands, moves all extremities, baseline confusion present.    Pending MRI for stroke workup completion. Pending transfer out of ICU.    CBC    Component Value Date/Time   WBC 3.6 (L) 09/27/2023 1630   RBC 3.27 (L) 09/27/2023 1630   HGB 11.5 (L) 09/27/2023 1630   HGB 12.4 06/13/2014 1433   HCT 33.9 (L) 09/27/2023 1630   HCT 37.4 06/13/2014 1433   PLT 200 09/27/2023 1630   PLT 184 06/13/2014 1433   MCV 103.7 (H) 09/27/2023 1630   MCV 104 (H) 06/13/2014 1433   MCH 35.2 (H) 09/27/2023 1630   MCHC 33.9 09/27/2023 1630   RDW 12.2 09/27/2023 1630   RDW 14.2 06/13/2014 1433   LYMPHSABS 1.5 09/27/2023 1630   LYMPHSABS 1.5 10/09/2012 0922   MONOABS 0.4 09/27/2023 1630   MONOABS 0.6 10/09/2012 0922   EOSABS 0.1 09/27/2023 1630   EOSABS 0.1 10/09/2012 0922   BASOSABS 0.0 09/27/2023 1630   BASOSABS 0.0 10/09/2012 0922    BMET    Component Value Date/Time   NA 139 09/30/2023 0732   NA 142 06/13/2014 1433   K 4.1 09/30/2023 0732   K 4.2 06/13/2014 1433   CL 108 09/30/2023 0732   CL 107 06/13/2014 1433   CO2 18 (L) 09/30/2023 0732   CO2 27 06/13/2014 1433   GLUCOSE 85 09/30/2023 0732   GLUCOSE 82 06/13/2014 1433   BUN 15 09/30/2023 0732   BUN 9 06/13/2014 1433   CREATININE 0.87 09/30/2023 0732   CREATININE 0.77 06/13/2014 1433   CALCIUM  9.3 09/30/2023 0732   CALCIUM  8.4 (L) 06/13/2014 1433   GFRNONAA >60 09/30/2023 0732   GFRNONAA >60 06/13/2014 1433   GFRNONAA >60 02/27/2014 1939    IMAGING past 24 hours No results found.   Vitals:   09/30/23 1000 09/30/23 1100 09/30/23 1200 09/30/23 1300  BP: (!) 98/58 109/62 108/89 (!)  88/62  Pulse: (!) 54 (!) 51 68   Resp: 10 14 19 11   Temp:   98.9 F (37.2 C)   TempSrc:   Oral   SpO2: 99% 96% 98%      PHYSICAL EXAM General: Frail elderly woman Psych:  Calm and cooperative CV: Regular rate and rhythm on monitor Respiratory:  Regular, unlabored respirations on room air GI: Abdomen soft and nontender  NEURO:  Mental Status: Oriented to self and age. Confused to place, time, can not give history (dementia at baseline)  Speech/Language: speech is without dysarthria.  Has some aphasia naming, repetition, fluency, and comprehension intact. Poor attention and concentration with decreased short-term memory and recall.    Cranial Nerves:  II: PERRL. Visual fields full.  III, IV, VI: EOMI. Eyelids elevate symmetrically.  V: Sensation is intact to light touch and symmetrical to face.  VII: Right facial droop VIII: hearing intact to voice. IX, X: Palate elevates symmetrically. Phonation is normal.  [id number] shrug 5/5. XII: tongue is midline without fasciculations. Motor:  Right arm with drift  (decreased ROM at shoulder from previous injury), left arm 5/5   bilateral lower extremities 5/5 Tone: is normal and bulk is normal Sensation- Intact to light touch bilaterally. Extinction  absent to light touch to DSS.   Coordination: FTN intact bilaterally, HKS: no ataxia in BLE.No drift.  Gait- deferred  Most recent NIH: 3   ASSESSMENT/PLAN  Marcia Brown is a 68 y.o. female with history of extensive prior history of epilepsy, but also hx of stroke, COPD, hyperlipidemia, neuropathy, OSA, dementia who presented to [facility] with AMS and speech difficulty and not answering questions. LSW 1550. Exam concerning for aphasia and right facial droop and right arm drift, but pt did have some mild facial droop from previous stroke and right arm fracture per niece.  Received IV TNK NIH on Admission 15  Stroke versus Stroke-like episode likely seizure with postictal state:  s/p  TNK Etiology: Possibly seizure medication noncompliance Code Stroke  CT head No acute abnormality. ASPECTS 10.    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  MRI: PENDING 24 hours CT scheduled for 1630 2D Echo EF 60 to 65%.  Left atrium severely dilated LDL 88 HgbA1c 4.4 VTE prophylaxis -SCDs clopidogrel  75 mg daily prior to admission, continue clopidogrel  75 mg daily Therapy recommendations: HH PT/OT (able to coordinate at her SNF) Disposition: back to SNF  Temporal lobe epilepsy History of stroke Sees Dr. Walden Guise at [place] [facility] last seen on 4/16 for breakthrough seizures Continue LTM LTM 4/19: Suggestive of cortical dysfunction arising from right temporal region consistent with underlying craniotomy. No seizures or epileptiform discharges were seen throughout the recording. Home medications-continue while in hospital  Unsure of home compliance, medication administration per SNF? Carbamazepine  150 mg twice daily. Vimpat  150 mg twice daily. Dilantin  XR 100 mg twice daily. Lamictal  200 mg AM and 300 mg PM. Keppra  500 mg  AM and 1000 mg PM LTM 4/20: evidence of epileptogenicity arising from LEFT frontal-anterior temporal region. Additionally there is cortical dysfunction arising from right temporal region likely secondary to underlying structural abnormality. No seizures were seen throughout the recording   Hypertension Home meds: Losartan  50 mg Stable Blood Pressure Goal: BP less than 180/105   Hyperlipidemia Home meds: Rosuvastatin  5 mg, resumed in hospital LDL ordered goal < 70 Increase to 20mg  Crestor  Continue statin at discharge  COPD Symbicort Home med Keep sats greater than 94%  GERD  Home Pepcid   Regular diet  Dementia Anxiety and depression On no home meds  Hyponatremia, resolved NA 129 -- 139  Leukopenia Anemia WBC 3.6, Hgb 11.5 will monitor AM daily labs   Dysphagia Patient has  post-stroke dysphagia, SLP consulted    Diet   DIET DYS 3 Room service appropriate? Yes; Fluid consistency: Thin   Advance diet as tolerated  Other Stroke Risk Factors Obstructive sleep apnea   Other Active Problems Neuropathy Home medications: Gabapentin  100mg  HS, continued BID while inpatient  Hospital day # 2   Pt seen by Neuro NP/APP and later by MD. Note/plan to be edited by MD as needed.    Marcia Lease, DNP, AGACNP-BC [facility] Please use AMION for contact information & EPIC for messaging. I have personally obtained history,examined this patient, reviewed notes, independently viewed imaging studies, participated in medical decision making and plan of care.ROS completed by me personally and pertinent positives fully documented  I have made any additions or clarifications directly to the above note. Agree with note above.  Patient neurological exam is slowly improving but she does have some cognitive impairment which may be baseline for her.  Mobilize out of bed.  Check MRI  scan today.  Transfer to skilled nursing facility tomorrow likely.  No family at the bedside.  Greater than 50% time during this 50-minute visit was spent in counseling and coordination of care discussion with patient and care team and answering questions.  Marcia Beaver, MD Medical Director [facility] Pager: [phone] 09/30/2023 2:23 PM

## 2023-10-01 DIAGNOSIS — R29810 Facial weakness: Secondary | ICD-10-CM | POA: Diagnosis not present

## 2023-10-01 DIAGNOSIS — R4182 Altered mental status, unspecified: Secondary | ICD-10-CM | POA: Diagnosis not present

## 2023-10-01 DIAGNOSIS — R4701 Aphasia: Secondary | ICD-10-CM | POA: Diagnosis not present

## 2023-10-01 LAB — CBC
HCT: 32.5 % — ABNORMAL LOW (ref 36.0–46.0)
Hemoglobin: 11 g/dL — ABNORMAL LOW (ref 12.0–15.0)
MCH: 34.6 pg — ABNORMAL HIGH (ref 26.0–34.0)
MCHC: 33.8 g/dL (ref 30.0–36.0)
MCV: 102.2 fL — ABNORMAL HIGH (ref 80.0–100.0)
Platelets: 183 10*3/uL (ref 150–400)
RBC: 3.18 MIL/uL — ABNORMAL LOW (ref 3.87–5.11)
RDW: 12.1 % (ref 11.5–15.5)
WBC: 3.2 10*3/uL — ABNORMAL LOW (ref 4.0–10.5)
nRBC: 0 % (ref 0.0–0.2)

## 2023-10-01 LAB — BASIC METABOLIC PANEL WITH GFR
Anion gap: 10 (ref 5–15)
BUN: 11 mg/dL (ref 8–23)
CO2: 24 mmol/L (ref 22–32)
Calcium: 9.2 mg/dL (ref 8.9–10.3)
Chloride: 105 mmol/L (ref 98–111)
Creatinine, Ser: 0.87 mg/dL (ref 0.44–1.00)
GFR, Estimated: >60 mL/min (ref >60)
Glucose, Bld: 82 mg/dL (ref 70–99)
Potassium: 3.4 mmol/L — ABNORMAL LOW (ref 3.5–5.1)
Sodium: 139 mmol/L (ref 135–145)

## 2023-10-01 MED ORDER — ROSUVASTATIN CALCIUM 20 MG PO TABS: 20.0000 mg | ORAL_TABLET | Freq: Every day | ORAL | Status: AC

## 2023-10-01 MED ORDER — LEVETIRACETAM 500 MG PO TABS: 500.0000 mg | ORAL_TABLET | Freq: Every morning | ORAL | Status: AC

## 2023-10-01 MED ORDER — GABAPENTIN 100 MG PO CAPS: 100.0000 mg | ORAL_CAPSULE | Freq: Two times a day (BID) | ORAL | Status: AC

## 2023-10-01 MED ORDER — LEVETIRACETAM 1000 MG PO TABS: 1000.0000 mg | ORAL_TABLET | Freq: Every day | ORAL | Status: AC

## 2023-10-01 MED ORDER — CARBAMAZEPINE 100 MG PO CHEW: 150.0000 mg | CHEWABLE_TABLET | Freq: Two times a day (BID) | ORAL | Status: AC

## 2023-10-01 MED ORDER — LAMOTRIGINE 200 MG PO TABS: 200.0000 mg | ORAL_TABLET | Freq: Every morning | ORAL | Status: AC

## 2023-10-01 MED ORDER — LAMOTRIGINE 150 MG PO TABS: 300.0000 mg | ORAL_TABLET | Freq: Every day | ORAL | Status: AC

## 2023-10-01 NOTE — Plan of Care (Signed)

## 2023-10-01 NOTE — Progress Notes (Signed)
 Speech Language Pathology Treatment: Cognitive-Linquistic  Patient Details Name: Marcia Brown MRN: [id number] DOB: Oct 09, 1955 Today's Date: 10/01/2023 Time: 6295-2841 SLP Time Calculation (min) (ACUTE ONLY): 20 min  Assessment / Plan / Recommendation Clinical Impression  Patient seen by SLP for skilled treatment focused on aphasia/language goals. She was sitting up in bed and had finished her breakfast. She continues to exhibit anomia but has some awareness to this, telling SLP " Its aggravating. I know what I want to say but can't." She was able to describe actions in picture scenes but required choice cues for naming the objects. During semi-structured conversation, she was able to name some people over at her ALF. (No one present to confirm names) With semantic and choice cues, she was able to more fully describe and communicate her specific needs. SLP will continue to follow and recommending f/u at ALF upon discharge.   HPI HPI: Patient is a 68 y.o. female with PMH: epilepsy, CVA, COPD, HLD, neuropathy, OSA, TBI, GERD, dementia who presented to [facility] on 09/28/23 with AMS, speech difficulty. Exam concerning for aphasia, right facial droop and right arm drift but patient did have mild facial droop from previous stroke and right arm fracture per niece's report. TNK was given. She was transferred to [facility] for LTM EEG and post-TNK monitoring and care. CT head did not show evidence of acute intracranial abnormality. MRI brain was negative for acute intracranial abnormality and showed Anterior right temporal lobe encephalomalacia, unchanged.      SLP Plan  Continue with current plan of care      Recommendations for follow up therapy are one component of a multi-disciplinary discharge planning process, led by the attending physician.  Recommendations may be updated based on patient status, additional functional criteria and insurance authorization.    Recommendations   SLP at ALF                        Frequent or constant Supervision/Assistance Aphasia (R47.01)     Continue with current plan of care     Jacqualine Mater, MA, CCC-SLP Speech Therapy

## 2023-10-01 NOTE — TOC Progression Note (Addendum)
 Transition of Care [facility]) - Progression Note    Patient Details  Name: Marcia Brown MRN: [id number] Date of Birth: Jun 12, 1955  Transition [facility] Care [facility]) CM/SW Contact  Elspeth Hals, LCSW Phone Number: 10/01/2023, 1:42 PM  Clinical Narrative:   CSW informed that pt can DC to [facility] [address]  CSW spoke with Dustin at [facility] of [address]  They do not need FL2, need DC summary faxed to [phone].  [facility] can transport if set up before 3pm.  MD informed.  1315: DC summary faxed.  Several attempts to call for transport--no answer at this time.    1400: TC Dustin: CSW confirmed he had DC summary.  Dustin will have transportation at [facility] main entrance in about 30 minutes.  RN informed.    Expected Discharge Plan: Assisted Living Barriers to Discharge: Barriers Resolved  Expected Discharge Plan and Services   Discharge Planning Services: CM Consult   Living arrangements for the past 2 months: Assisted Living Facility Expected Discharge Date: 10/01/23                           [facility] Agency: [facility] Date [facility] Agency Contacted: 09/30/23 Time HH Agency Contacted: 1159 Representative spoke with at [facility] Agency: Imelda Man   Social Determinants of Health (SDOH) Interventions SDOH Screenings   Food Insecurity: No Food Insecurity (09/28/2023)  Housing: Unknown (09/28/2023)  Transportation Needs: No Transportation Needs (11/13/2022)  Utilities: Patient Unable To Answer (09/28/2023)  Tobacco Use: High Risk (09/27/2023)    Readmission Risk Interventions     No data to display

## 2023-10-01 NOTE — Progress Notes (Signed)
 Pt refusing tele at this time. Pt takes tele off every few minutes. Staff has attempted mitts but pt eventually gets mitts off. Neurology provider on call aware. Pt's bed alarm is set with bed in lowest position.

## 2023-10-01 NOTE — Progress Notes (Signed)
 Patient wheeled off unit by [facility] with DC instructions, report called to facility.

## 2023-10-01 NOTE — Care Management Important Message (Signed)
 Important Message  Patient Details  Name: Marcia Brown MRN: [id number] Date of Birth: [birth year]   Important Message Given:  Yes - Medicare IM     Wynonia Hedges 10/01/2023, 2:59 PM

## 2023-10-01 NOTE — TOC Transition Note (Signed)
 Transition of Care [facility]) - Discharge Note   Patient Details  Name: Marcia Brown MRN: [id number] Date of Birth: 1955/09/06  Transition of Care [facility]) CM/SW Contact:  Elspeth Hals, LCSW Phone Number: 10/01/2023, 2:07 PM   Clinical Narrative:   Pt discharging to [facility]  RN call report to [phone].  [facility] will provide transportation.  They will call main RN station when they arrive and will need pt brought out with assistance getting into the vehicle.      Final next level of care: Assisted Living Barriers to Discharge: Barriers Resolved   Patient Goals and CMS Choice   CMS Medicare.gov Compare Post Acute Care list provided to:: Patient        Discharge Placement                Patient to be transferred to facility by: [facility] Name of family member notified: left message with niece [facility]    Discharge Plan and Services Additional resources added to the After Visit Summary for     Discharge Planning Services: CM Consult                        [facility] Agency: [facility] Date [facility] Agency Contacted: 09/30/23 Time HH Agency Contacted: 1159 Representative spoke with at [facility] Agency: Imelda Man  Social Drivers of Health (SDOH) Interventions SDOH Screenings   Food Insecurity: No Food Insecurity (09/28/2023)  Housing: Unknown (09/28/2023)  Transportation Needs: No Transportation Needs (11/13/2022)  Utilities: Patient Unable To Answer (09/28/2023)  Tobacco Use: High Risk (09/27/2023)     Readmission Risk Interventions     No data to display

## 2023-11-16 ENCOUNTER — Emergency Department
Admission: EM | Admit: 2023-11-16 | Discharge: 2023-11-17 | Disposition: A | Discharge status: Home / Self Care | Attending: Emergency Medicine | Admitting: Emergency Medicine

## 2023-11-16 ENCOUNTER — Other Ambulatory Visit: Payer: Self-pay

## 2023-11-16 ENCOUNTER — Emergency Department

## 2023-11-16 DIAGNOSIS — W19XXXA Unspecified fall, initial encounter: Secondary | ICD-10-CM | POA: Diagnosis not present

## 2023-11-16 DIAGNOSIS — S0990XA Unspecified injury of head, initial encounter: Secondary | ICD-10-CM | POA: Diagnosis not present

## 2023-11-16 DIAGNOSIS — R296 Repeated falls: Secondary | ICD-10-CM | POA: Diagnosis not present

## 2023-11-16 DIAGNOSIS — N390 Urinary tract infection, site not specified: Secondary | ICD-10-CM | POA: Insufficient documentation

## 2023-11-16 DIAGNOSIS — B9689 Other specified bacterial agents as the cause of diseases classified elsewhere: Secondary | ICD-10-CM | POA: Diagnosis not present

## 2023-11-16 DIAGNOSIS — J449 Chronic obstructive pulmonary disease, unspecified: Secondary | ICD-10-CM | POA: Diagnosis not present

## 2023-11-16 LAB — COMPREHENSIVE METABOLIC PANEL WITH GFR
ALT: 16 U/L (ref 0–44)
AST: 19 U/L (ref 15–41)
Albumin: 3.5 g/dL (ref 3.5–5.0)
Alkaline Phosphatase: 62 U/L (ref 38–126)
Anion gap: 7 (ref 5–15)
BUN: 18 mg/dL (ref 8–23)
CO2: 23 mmol/L (ref 22–32)
Calcium: 8.6 mg/dL — ABNORMAL LOW (ref 8.9–10.3)
Chloride: 106 mmol/L (ref 98–111)
Creatinine, Ser: 0.86 mg/dL (ref 0.44–1.00)
GFR, Estimated: >60 mL/min (ref >60)
Glucose, Bld: 86 mg/dL (ref 70–99)
Potassium: 3.8 mmol/L (ref 3.5–5.1)
Sodium: 136 mmol/L (ref 135–145)
Total Bilirubin: 0.5 mg/dL (ref 0.0–1.2)
Total Protein: 6.1 g/dL — ABNORMAL LOW (ref 6.5–8.1)

## 2023-11-16 LAB — CBC WITH DIFFERENTIAL/PLATELET
Abs Immature Granulocytes: 0.01 10*3/uL (ref 0.00–0.07)
Basophils Absolute: 0 10*3/uL (ref 0.0–0.1)
Basophils Relative: 0 %
Eosinophils Absolute: 0.2 10*3/uL (ref 0.0–0.5)
Eosinophils Relative: 3 %
HCT: 34.5 % — ABNORMAL LOW (ref 36.0–46.0)
Hemoglobin: 11.5 g/dL — ABNORMAL LOW (ref 12.0–15.0)
Immature Granulocytes: 0 %
Lymphocytes Relative: 34 %
Lymphs Abs: 1.6 10*3/uL (ref 0.7–4.0)
MCH: 34.8 pg — ABNORMAL HIGH (ref 26.0–34.0)
MCHC: 33.3 g/dL (ref 30.0–36.0)
MCV: 104.5 fL — ABNORMAL HIGH (ref 80.0–100.0)
Monocytes Absolute: 0.8 10*3/uL (ref 0.1–1.0)
Monocytes Relative: 16 %
Neutro Abs: 2.2 10*3/uL (ref 1.7–7.7)
Neutrophils Relative %: 47 %
Platelets: 156 10*3/uL (ref 150–400)
RBC: 3.3 MIL/uL — ABNORMAL LOW (ref 3.87–5.11)
RDW: 12.9 % (ref 11.5–15.5)
WBC: 4.8 10*3/uL (ref 4.0–10.5)
nRBC: 0 % (ref 0.0–0.2)

## 2023-11-16 LAB — CBG MONITORING, ED: Glucose-Capillary: 96 mg/dL (ref 70–99)

## 2023-11-16 NOTE — ED Provider Notes (Signed)
 Patient's comprehensive metabolic panel is normal.  CBC also normal to the patient's previous baseline of very slight anemia.  Urinalysis concerning for urinary tract infection.  She does not have symptoms suggest acute complicated UTI.  She has hemodynamics that are normal, normal white count, no flank pain.  ----------------------------------------- 1:16 AM on 11/17/2023 ----------------------------------------- Reviewed prior urine culture.  Will treat with fosfomycin 1 dose now and repeat dose in 3 days.  Discussed with patient that she will follow-up with Drs. Making housecalls early this week, comfortable with plan to return to the [place].  She is resting comfortably alert without distress appropriate for discharge with transport back to her care facility     CT Head Wo Contrast Result Date: 11/16/2023 CLINICAL DATA:  History of recent falls with headaches and neck pain, initial encounter EXAM: CT HEAD WITHOUT CONTRAST CT CERVICAL SPINE WITHOUT CONTRAST TECHNIQUE: Multidetector CT imaging of the head and cervical spine was performed following the standard protocol without intravenous contrast. Multiplanar CT image reconstructions of the cervical spine were also generated. RADIATION DOSE REDUCTION: This exam was performed according to the departmental dose-optimization program which includes automated exposure control, adjustment of the mA and/or kV according to patient size and/or use of iterative reconstruction technique. COMPARISON:  09/28/2023 FINDINGS: CT HEAD FINDINGS Brain: No evidence of acute infarction, hemorrhage, hydrocephalus, extra-axial collection or mass lesion/mass effect. Mild atrophic changes are seen. Vascular: No hyperdense vessel or unexpected calcification. Skull: Old right zygomatic arch fracture is noted. Prior right craniotomy is noted as well. Old right lateral orbital wall fracture is seen. Sinuses/Orbits: No acute finding. Other: None CT CERVICAL SPINE FINDINGS  Alignment: Mild degenerative anterolisthesis of C3 on C4. Skull base and vertebrae: 7 cervical segments are well visualized. Vertebral body height is well maintained. Multilevel osteophytic changes are seen. Facet hypertrophic changes are noted as well. No acute fracture or acute facet abnormality is noted. Soft tissues and spinal canal: Surrounding soft tissue structures are within normal limits. Upper chest: Visualized lung apices are unremarkable. Other: None IMPRESSION: CT of the head: Chronic atrophic changes without acute abnormality. CT of the cervical spine: Multilevel degenerative change without acute abnormality. Electronically Signed   By: Violeta Grey M.D.   On: 11/16/2023 23:20   CT Cervical Spine Wo Contrast Result Date: 11/16/2023 CLINICAL DATA:  History of recent falls with headaches and neck pain, initial encounter EXAM: CT HEAD WITHOUT CONTRAST CT CERVICAL SPINE WITHOUT CONTRAST TECHNIQUE: Multidetector CT imaging of the head and cervical spine was performed following the standard protocol without intravenous contrast. Multiplanar CT image reconstructions of the cervical spine were also generated. RADIATION DOSE REDUCTION: This exam was performed according to the departmental dose-optimization program which includes automated exposure control, adjustment of the mA and/or kV according to patient size and/or use of iterative reconstruction technique. COMPARISON:  09/28/2023 FINDINGS: CT HEAD FINDINGS Brain: No evidence of acute infarction, hemorrhage, hydrocephalus, extra-axial collection or mass lesion/mass effect. Mild atrophic changes are seen. Vascular: No hyperdense vessel or unexpected calcification. Skull: Old right zygomatic arch fracture is noted. Prior right craniotomy is noted as well. Old right lateral orbital wall fracture is seen. Sinuses/Orbits: No acute finding. Other: None CT CERVICAL SPINE FINDINGS Alignment: Mild degenerative anterolisthesis of C3 on C4. Skull base and vertebrae:  7 cervical segments are well visualized. Vertebral body height is well maintained. Multilevel osteophytic changes are seen. Facet hypertrophic changes are noted as well. No acute fracture or acute facet abnormality is noted. Soft tissues and spinal canal:  Surrounding soft tissue structures are within normal limits. Upper chest: Visualized lung apices are unremarkable. Other: None IMPRESSION: CT of the head: Chronic atrophic changes without acute abnormality. CT of the cervical spine: Multilevel degenerative change without acute abnormality. Electronically Signed   By: Violeta Grey M.D.   On: 11/16/2023 23:20      Return precautions and treatment recommendations and follow-up discussed with the patient who is agreeable with the plan.      Iver Marker, MD 11/17/23 [phone]

## 2023-11-16 NOTE — ED Provider Notes (Signed)
 [facility] Provider Note   Event Date/Time   First MD Initiated Contact with Patient 11/16/23 2205     (approximate) History  Fall  HPI Marcia Brown is a 68 y.o. female with a past medical history of COPD, epilepsy, and traumatic brain injury who presents via EMS after a fall.  Patient presents from the [place] after reported frequent falls.  Patient states that she did hit her head on a wall when she fell to the side but did not lose consciousness.  Patient denies any other complaints at this time ROS: Patient currently denies any vision changes, tinnitus, difficulty speaking, facial droop, sore throat, chest pain, shortness of breath, abdominal pain, nausea/vomiting/diarrhea, dysuria, or weakness/numbness/paresthesias in any extremity   Physical Exam  Triage Vital Signs: ED Triage Vitals [11/16/23 2210]  Encounter Vitals Group     BP 104/68     Systolic BP Percentile      Diastolic BP Percentile      Pulse Rate (!) 56     Resp 18     Temp 98.4 F (36.9 C)     Temp src      SpO2 97 %     Weight      Height      Head Circumference      Peak Flow      Pain Score      Pain Loc      Pain Education      Exclude from Growth Chart    Most recent vital signs: Vitals:   11/17/23 0230 11/17/23 0300  BP: 102/69 111/62  Pulse: (!) 51 (!) 51  Resp: 17 12  Temp:    SpO2: 100% 99%   General: Awake, oriented x4. CV:  Good peripheral perfusion. Resp:  Normal effort. Abd:  No distention. Other:  Elderly overweight Caucasian female resting comfortably in no acute distress ED Results / Procedures / Treatments  Labs (all labs ordered are listed, but only abnormal results are displayed) Labs Reviewed  COMPREHENSIVE METABOLIC PANEL WITH GFR - Abnormal; Notable for the following components:      Result Value   Calcium  8.6 (*)    Total Protein 6.1 (*)    All other components within normal limits  CBC WITH DIFFERENTIAL/PLATELET - Abnormal; Notable for the  following components:   RBC 3.30 (*)    Hemoglobin 11.5 (*)    HCT 34.5 (*)    MCV 104.5 (*)    MCH 34.8 (*)    All other components within normal limits  URINALYSIS, ROUTINE W REFLEX MICROSCOPIC - Abnormal; Notable for the following components:   Color, Urine YELLOW (*)    APPearance HAZY (*)    Hgb urine dipstick MODERATE (*)    Nitrite POSITIVE (*)    Leukocytes,Ua LARGE (*)    Bacteria, UA RARE (*)    All other components within normal limits  CBG MONITORING, ED  TROPONIN I (HIGH SENSITIVITY)  TROPONIN I (HIGH SENSITIVITY)   EKG ED ECG REPORT I, Charleen Conn, the attending physician, personally viewed and interpreted this ECG. Date: 11/16/2023 EKG Time: 2217 Rate: 57 Rhythm: normal sinus rhythm QRS Axis: normal Intervals: normal ST/T Wave abnormalities: normal Narrative Interpretation: no evidence of acute ischemia RADIOLOGY ED MD interpretation: Pending - All radiology independently interpreted and agree with radiology assessment Official radiology report(s): No results found. PROCEDURES: Critical Care performed: No Procedures MEDICATIONS ORDERED IN ED: Medications  fosfomycin (MONUROL ) packet 3 g (3 g Oral  Given 11/17/23 0127)   IMPRESSION / MDM / ASSESSMENT AND PLAN / ED COURSE  I reviewed the triage vital signs and the nursing notes.                             The patient is on the cardiac monitor to evaluate for evidence of arrhythmia and/or significant heart rate changes. Patient's presentation is most consistent with acute presentation with potential threat to life or bodily function. Presenting after a fall that occurred just prior to arrival, resulting in no apparent injuries.  The mechanism was a mechanical ground level fall without syncope or near-syncope. The current level of pain is moderate. There was no loss of consciousness, confusion, seizure, or memory impairment. There is not a laceration associated with the injury. Denies neck pain. The  patient does not take blood thinner medications. Denies vomiting, numbness/weakness, fever  Dispo: Care of this patient will be signed out to the oncoming physician at the end of my shift.  All pertinent patient information conveyed and all questions answered.  All further care and disposition decisions will be made by the oncoming physician.       FINAL CLINICAL IMPRESSION(S) / ED DIAGNOSES   Final diagnoses:  Urinary tract infection, acute   Rx / DC Orders   ED Discharge Orders          Ordered    fosfomycin (MONUROL ) 3 g PACK   Once        11/17/23 0114           Note:  This document was prepared using Dragon voice recognition software and may include unintentional dictation errors.   Jayvyn Haselton K, MD 11/21/23 [phone]

## 2023-11-16 NOTE — ED Triage Notes (Signed)
 Pt coming via [facility] from the [facility] for evaluation for frequent falls. Pt had a fall approx 20 minutes ago with no head injury of LOC. Per ems they said she has hx of seizure and dementia.

## 2023-11-17 DIAGNOSIS — S0990XA Unspecified injury of head, initial encounter: Secondary | ICD-10-CM | POA: Diagnosis not present

## 2023-11-17 LAB — URINALYSIS, ROUTINE W REFLEX MICROSCOPIC
Bilirubin Urine: NEGATIVE
Glucose, UA: NEGATIVE mg/dL
Ketones, ur: NEGATIVE mg/dL
Nitrite: POSITIVE — AB
Protein, ur: NEGATIVE mg/dL
Specific Gravity, Urine: 1.008 (ref 1.005–1.030)
WBC, UA: >50 WBC/hpf (ref 0–5)
pH: 6 (ref 5.0–8.0)

## 2023-11-17 LAB — TROPONIN I (HIGH SENSITIVITY)
Troponin I (High Sensitivity): 4 ng/L (ref <18)
Troponin I (High Sensitivity): 4 ng/L (ref <18)

## 2023-11-17 MED ORDER — FOSFOMYCIN TROMETHAMINE 3 G PO PACK: 3.0000 g | PACK | Freq: Once | ORAL | 0 refills | Status: AC

## 2023-11-17 MED ORDER — FOSFOMYCIN TROMETHAMINE 3 G PO PACK
3.0000 g | PACK | ORAL | Status: AC
  Administered 2023-11-17: 3 g via ORAL
  Filled 2023-11-17: qty 3

## 2023-11-17 NOTE — Discharge Instructions (Addendum)
 You were given a dose of an antibiotic called fosfomycin tonight while in the ER as an initial dose.  You will need to take another dose in about 3 days, we have provided this as a written prescription.  Follow-up closely with your doctor this week.  Return to the ER if your symptoms worsen, you fall or become injured, you have confusion, increasing fatigue, high fevers vomiting back or abdominal pain or other concerns arise

## 2023-11-17 NOTE — ED Notes (Signed)
 Called to [facility] Per RN Micheal @ 224 am/[facility] [place]/Rep Myrtie Atkinson.

## 2024-02-21 ENCOUNTER — Emergency Department

## 2024-02-21 ENCOUNTER — Emergency Department
Admission: EM | Admit: 2024-02-21 | Discharge: 2024-02-21 | Disposition: A | Discharge status: Home / Self Care | Source: Skilled Nursing Facility | Attending: Emergency Medicine | Admitting: Emergency Medicine

## 2024-02-21 ENCOUNTER — Other Ambulatory Visit: Payer: Self-pay

## 2024-02-21 DIAGNOSIS — W19XXXA Unspecified fall, initial encounter: Secondary | ICD-10-CM | POA: Insufficient documentation

## 2024-02-21 DIAGNOSIS — S0093XA Contusion of unspecified part of head, initial encounter: Secondary | ICD-10-CM | POA: Insufficient documentation

## 2024-02-21 DIAGNOSIS — S0990XA Unspecified injury of head, initial encounter: Secondary | ICD-10-CM

## 2024-02-21 LAB — CBC WITH DIFFERENTIAL/PLATELET
Abs Immature Granulocytes: 0.01 K/uL (ref 0.00–0.07)
Basophils Absolute: 0 K/uL (ref 0.0–0.1)
Basophils Relative: 1 %
Eosinophils Absolute: 0.1 K/uL (ref 0.0–0.5)
Eosinophils Relative: 4 %
HCT: 35.2 % — ABNORMAL LOW (ref 36.0–46.0)
Hemoglobin: 11.8 g/dL — ABNORMAL LOW (ref 12.0–15.0)
Immature Granulocytes: 0 %
Lymphocytes Relative: 31 %
Lymphs Abs: 1.1 K/uL (ref 0.7–4.0)
MCH: 33.9 pg (ref 26.0–34.0)
MCHC: 33.5 g/dL (ref 30.0–36.0)
MCV: 101.1 fL — ABNORMAL HIGH (ref 80.0–100.0)
Monocytes Absolute: 0.4 K/uL (ref 0.1–1.0)
Monocytes Relative: 12 %
Neutro Abs: 1.9 K/uL (ref 1.7–7.7)
Neutrophils Relative %: 52 %
Platelets: 203 K/uL (ref 150–400)
RBC: 3.48 MIL/uL — ABNORMAL LOW (ref 3.87–5.11)
RDW: 13.5 % (ref 11.5–15.5)
WBC: 3.6 K/uL — ABNORMAL LOW (ref 4.0–10.5)
nRBC: 0 % (ref 0.0–0.2)

## 2024-02-21 LAB — COMPREHENSIVE METABOLIC PANEL WITH GFR
ALT: 16 U/L (ref 0–44)
AST: 23 U/L (ref 15–41)
Albumin: 3.8 g/dL (ref 3.5–5.0)
Alkaline Phosphatase: 73 U/L (ref 38–126)
Anion gap: 9 (ref 5–15)
BUN: 18 mg/dL (ref 8–23)
CO2: 24 mmol/L (ref 22–32)
Calcium: 8.6 mg/dL — ABNORMAL LOW (ref 8.9–10.3)
Chloride: 106 mmol/L (ref 98–111)
Creatinine, Ser: 0.81 mg/dL (ref 0.44–1.00)
GFR, Estimated: >60 mL/min (ref >60)
Glucose, Bld: 84 mg/dL (ref 70–99)
Potassium: 4.2 mmol/L (ref 3.5–5.1)
Sodium: 139 mmol/L (ref 135–145)
Total Bilirubin: 0.5 mg/dL (ref 0.0–1.2)
Total Protein: 6.5 g/dL (ref 6.5–8.1)

## 2024-02-21 LAB — URINALYSIS, ROUTINE W REFLEX MICROSCOPIC
Bacteria, UA: NONE SEEN
Bilirubin Urine: NEGATIVE
Glucose, UA: NEGATIVE mg/dL
Ketones, ur: NEGATIVE mg/dL
Leukocytes,Ua: NEGATIVE
Nitrite: NEGATIVE
Protein, ur: NEGATIVE mg/dL
Specific Gravity, Urine: 1.009 (ref 1.005–1.030)
pH: 7 (ref 5.0–8.0)

## 2024-02-21 LAB — TROPONIN I (HIGH SENSITIVITY)
Troponin I (High Sensitivity): 3 ng/L (ref <18)
Troponin I (High Sensitivity): 3 ng/L (ref <18)

## 2024-02-21 MED ORDER — SODIUM CHLORIDE 0.9 % IV BOLUS
500.0000 mL | Freq: Once | INTRAVENOUS | Status: AC
  Administered 2024-02-21: 500 mL via INTRAVENOUS

## 2024-02-21 NOTE — ED Provider Notes (Signed)
 [facility] Provider Note    Event Date/Time   First MD Initiated Contact with Patient 02/21/24 1035     (approximate)   History   Fall   HPI  Marcia Brown is a 68 y.o. female with history of seizures who comes in with concerns for fall.  Patient comes in from [place] [facility] [place] for unwitnessed fall.  They are unsure if patient could have had a seizure today.  Patient reports that at baseline she has some confusion so sometimes it is hard to know if she is off from her baseline or not.  They were unsure if a seizure could have happened that caused the fall.  Patient denies any symptoms.   Physical Exam   Triage Vital Signs: ED Triage Vitals  Encounter Vitals Group     BP 02/21/24 1044 121/68     Girls Systolic BP Percentile --      Girls Diastolic BP Percentile --      Boys Systolic BP Percentile --      Boys Diastolic BP Percentile --      Pulse Rate 02/21/24 1044 (!) 56     Resp 02/21/24 1044 (!) 21     Temp 02/21/24 1044 98 F (36.7 C)     Temp Source 02/21/24 1044 Oral     SpO2 02/21/24 1044 98 %     Weight 02/21/24 1042 145 lb 14.4 oz (66.2 kg)     Height 02/21/24 1042 5' 4 (1.626 m)     Head Circumference --      Peak Flow --      Pain Score --      Pain Loc --      Pain Education --      Exclude from Growth Chart --     Most recent vital signs: Vitals:   02/21/24 1044  BP: 121/68  Pulse: (!) 56  Resp: (!) 21  Temp: 98 F (36.7 C)  SpO2: 98%     General: Awake, no distress.  CV:  Good peripheral perfusion.  Resp:  Normal effort.  Abd:  No distention.  Other:  Hematoma on the back of the head.   Able to lift both legs up off the bed.  No CTL spine tenderness.  No abdominal tenderness no chest wall tenderness no cranial nerve deficits noted.  Difficulties lifting up her right arm but she reports that is chronic from a prior fall.   ED Results / Procedures / Treatments   Labs (all labs ordered are listed, but only abnormal  results are displayed) Labs Reviewed  CBC WITH DIFFERENTIAL/PLATELET - Abnormal; Notable for the following components:      Result Value   WBC 3.6 (*)    RBC 3.48 (*)    Hemoglobin 11.8 (*)    HCT 35.2 (*)    MCV 101.1 (*)    All other components within normal limits  COMPREHENSIVE METABOLIC PANEL WITH GFR  URINALYSIS, ROUTINE W REFLEX MICROSCOPIC  TROPONIN I (HIGH SENSITIVITY)     EKG  My interpretation of EKG:  Sinus rate of 51 without any ST elevation or T wave inversions, normal intervals  Sinus rate of 51 without any ST elevation or T wave inversions, normal intervals  Reviewed her prior EKGs and she has had sinus bradycardia previously  RADIOLOGY I have reviewed the CT personally interpreted no evidence of any cranial hemorrhage   PROCEDURES:  Critical Care performed: No  .1-3 Lead EKG Interpretation  Performed by: Ernest Ronal BRAVO, MD Authorized by: Ernest Ronal BRAVO, MD     Interpretation: normal     ECG rate:  50   ECG rate assessment: bradycardic     Rhythm: sinus bradycardia     Ectopy: none     Conduction: normal      MEDICATIONS ORDERED IN ED: Medications  sodium chloride  0.9 % bolus 500 mL (has no administration in time range)     IMPRESSION / MDM / ASSESSMENT AND PLAN / ED COURSE  I reviewed the triage vital signs and the nursing notes.   Patient's presentation is most consistent with acute presentation with potential threat to life or bodily function.   Patient comes in for unwitnessed fall unclear if it could have been a mechanical, syncopal versus seizure.  Patient is very pleasant here moving all extremities.  She denies any new pain in her hips or her arms.  CT imaging ordered given hematoma of the head and given she is not the best historian CT cervical evaluate for cervical fracture.  Will get some blood work to evaluate for potential causes of some confusion although it sounds like from facility she is at her baseline.  Troponin is negative  CBC shows white count but slightly low similar to prior CMP is reassuring  EKG has been read as junctional but on review of it she does actually have P waves and this looks similar to her prior EKGs where she has been in sinus bradycardia  IMPRESSION: 1. Acute soft tissue injury to the right face, left vertex scalp. No acute fracture identified. 2. No acute intracranial abnormality. 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  IMPRESSION: 1. No acute traumatic injury identified in the cervical spine. 2. 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   12:56 PM reevaluated patient she is able to give a urine sample.  She reports a little bit of lightheadedness when she stands up will give a little bit of fluids.  Her urine is negative for UTI.  Troponins are negative x 2.  I did review her cardiac monitor and that she is in sinus bradycardia not a junctional rhythm.  Discussed with the facility who do report that they did not see any seizure but she did seem more confused than normal which typically happens after a seizure.  She has not had any additional seizure activity here for over 3 hours so we discussed returning back to facility and she can follow-up with her neurologist to discuss if medications will need to be changed.  They do report at baseline she is been having some worsening confusion for weeks now and that they are talk about getting into a memory unit.  We discussed that her workup here does not show any signs of UTI or other acute pathology.  Patient has been up and ambulatory she is feeling better no evidence of nonconvulsive seizures and she appears to be at her baseline self so we will discharge back to facility  The patient is on the cardiac monitor to evaluate for evidence of arrhythmia and/or significant heart rate changes.      FINAL CLINICAL IMPRESSION(S) / ED DIAGNOSES   Final  diagnoses:  Injury of head, initial encounter     Rx / DC Orders   ED Discharge Orders     None        Note:  This document was prepared using Dragon voice recognition software and may include  unintentional dictation errors.   Ernest Ronal BRAVO, MD 02/21/24 1355

## 2024-02-21 NOTE — ED Notes (Signed)
 Fall bundle in place. Fall bracelet and non-skid socks placed on pt. Bed alarm activated & audible.

## 2024-02-21 NOTE — Discharge Instructions (Addendum)
 Your workup was reassuring without any evidence of bleeding in the brain and your blood work, urine without evidence of UTI.  She no additional seizure activity here.  She needs to call her neurologist for a follow-up appointment to discuss in case this was a seizure today.  Return to the ER for fevers, worsening symptoms or any other concern

## 2024-02-21 NOTE — ED Triage Notes (Signed)
 Pt brought in by EMS from the [place] [facility] [place] for an unwitnessed fall. Per EMS, pt has a history of seizures. It is unknown if a seizure contributed to patient's fall today. Per EMS, pt hit head today. Pt is confused, but facility states pt has baseline confusion.

## 2024-03-27 ENCOUNTER — Emergency Department
Admission: EM | Admit: 2024-03-27 | Discharge: 2024-03-27 | Disposition: A | Discharge status: Skilled Nursing Facility | Attending: Emergency Medicine | Admitting: Emergency Medicine

## 2024-03-27 ENCOUNTER — Emergency Department

## 2024-03-27 ENCOUNTER — Other Ambulatory Visit: Payer: Self-pay

## 2024-03-27 DIAGNOSIS — S42291K Other displaced fracture of upper end of right humerus, subsequent encounter for fracture with nonunion: Secondary | ICD-10-CM | POA: Insufficient documentation

## 2024-03-27 DIAGNOSIS — W1830XA Fall on same level, unspecified, initial encounter: Secondary | ICD-10-CM | POA: Insufficient documentation

## 2024-03-27 DIAGNOSIS — S4991XA Unspecified injury of right shoulder and upper arm, initial encounter: Secondary | ICD-10-CM | POA: Diagnosis present

## 2024-03-27 DIAGNOSIS — W19XXXA Unspecified fall, initial encounter: Secondary | ICD-10-CM

## 2024-03-27 MED ORDER — ACETAMINOPHEN 500 MG PO TABS
1000.0000 mg | ORAL_TABLET | Freq: Once | ORAL | Status: AC
  Administered 2024-03-27: 1000 mg via ORAL
  Filled 2024-03-27: qty 2

## 2024-03-27 NOTE — TOC Transition Note (Signed)
 Transition of Care [facility]) - Discharge Note   Patient Details  Name: Marcia Brown MRN: [id number] Date of Birth: 08-May-1956  [facility] CM/SW Contact:  [place] L Tyra Michelle, LCSW Phone Number: 03/27/2024, 3:45 PM   Clinical Narrative:     CSW spoke with Amber, [place], regarding discharge planning. Amber advised of concerns regarding patient and her mental decline. She advised that she has already been in contact with DSS on how to apply for guardianship. CSW advised that PCP can facilitate completing FL2. CSW advised that family would need to locate LTC placement. CSW explained the criteria for SNF placement. Patient does not meet that criteria on today's visit.   CSW advised that patient will return to [facility].   CSW met with patient. Patient appears to be confused and answered every question with whatever you say.   No further TOC needs. TOC signing off.         Patient Goals and CMS Choice            Discharge Placement                       Discharge Plan and Services Additional resources added to the After Visit Summary for                                       Social Drivers of Health (SDOH) Interventions SDOH Screenings   Food Insecurity: No Food Insecurity (09/28/2023)  Housing: Unknown (09/28/2023)  Transportation Needs: No Transportation Needs (11/13/2022)  Utilities: Patient Unable To Answer (09/28/2023)  Tobacco Use: High Risk (03/27/2024)     Readmission Risk Interventions     No data to display

## 2024-03-27 NOTE — ED Provider Notes (Signed)
 [facility] Provider Note    Event Date/Time   First MD Initiated Contact with Patient 03/27/24 1225     (approximate)   History   Fall   HPI  Marcia Brown is a 68 y.o. female who presents to the ED for evaluation of Fall   Review a neurology clinic visit from July.  History of temporal lobe epilepsy.  Tegretol , Vimpat , Lamictal , Keppra  and Dilantin  daily.  Frequent falls and imbalance.  Plavix  without anticoagulation.  Patient presents to the ED from a local SNF for a reportedly fractured proximal humerus on the right.  Per reports from EMS patient fell 2 days ago, they took x-rays yesterday and got the results that patient had a fracture proximal humerus on the right so they sent her to the ED for evaluation.  Here patient is pleasantly disoriented and has no concerns or complaints beyond some soreness to her right shoulder.  Not sure when and why she fell.  No additional concerns.  We are told that she is at her baseline   Physical Exam   Triage Vital Signs: ED Triage Vitals  Encounter Vitals Group     BP      Girls Systolic BP Percentile      Girls Diastolic BP Percentile      Boys Systolic BP Percentile      Boys Diastolic BP Percentile      Pulse      Resp      Temp      Temp src      SpO2      Weight      Height      Head Circumference      Peak Flow      Pain Score      Pain Loc      Pain Education      Exclude from Growth Chart     Most recent vital signs: Vitals:   03/27/24 1231  BP: (!) 101/58  Pulse: (!) 52  Resp: 17  Temp: 97.9 F (36.6 C)  SpO2: 97%    General: Awake, no distress.  CV:  Good peripheral perfusion.  Resp:  Normal effort.  Abd:  No distention.  MSK:  No deformity noted.  Passively ranges bilateral lower extremities and left upper extremity without impaired range of motion, tenderness or signs of trauma.  Tenderness to the proximal right arm near her shoulder without clear deformity or evidence of open  injury.  Well-perfused distal extremity without signs of trauma distally to the right side. Neuro:  No focal deficits appreciated. Other:     ED Results / Procedures / Treatments   Labs (all labs ordered are listed, but only abnormal results are displayed) Labs Reviewed - No data to display  EKG   RADIOLOGY Plain film of the right shoulder chronically demonstrates nonunion of proximal humerus fracture.  Shoulder x-ray from last year demonstrates acute fracture  Official radiology report(s): No results found.  PROCEDURES and INTERVENTIONS:  Procedures  Medications  acetaminophen  (TYLENOL ) tablet 1,000 mg (1,000 mg Oral Given 03/27/24 1239)     IMPRESSION / MDM / ASSESSMENT AND PLAN / ED COURSE  I reviewed the triage vital signs and the nursing notes.  Differential diagnosis includes, but is not limited to, fracture, dislocation, polytrauma  {Patient presents with symptoms of an acute illness or injury that is potentially life-threatening.  Patient presents to the ED pleasantly disoriented from a local SNF with a reportedly  broken right arm.  We will confirm with x-rays but suspect sling and suitable for outpatient management and orthopedic follow-up per reports from outpatient workup.  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  Inappropriate ED use  Clinical Course as of 03/27/24 1328  Fri Mar 27, 2024  1302 I consult with ortho, Dr. Cleotilde, chronic fracture, nonunion. 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    Claudene Rover, MD 03/27/24 1329

## 2024-03-27 NOTE — ED Triage Notes (Signed)
 Pt brought in by EMS from the [facility] [place] for a fall. Per EMS, pt fell 2 days ago. SNF x-rayed pt, showed right humerus fx. Pt c/o right arm pain; rates it 10/10.

## 2024-03-27 NOTE — ED Notes (Signed)
 Attempted to call report to [facility] at [place]. No answer.

## 2024-03-27 NOTE — ED Notes (Signed)
 Report given to [place] at [facility] [place]

## 2024-03-27 NOTE — ED Notes (Signed)
 Fall bundle in place. Sneakers remain on pt. Fall bracelet placed on pt. Bed alarm activated & audible.

## 2024-03-27 NOTE — Discharge Instructions (Addendum)
 Her RIGHT humerus remains broken. It has been broken for over 1 year. Keep it in a sling and follow up with orthopedics in the clinic to discuss any options.

## 2024-06-06 ENCOUNTER — Emergency Department

## 2024-06-06 ENCOUNTER — Emergency Department
Admission: EM | Admit: 2024-06-06 | Discharge: 2024-06-06 | Disposition: A | Discharge status: Home / Self Care | Attending: Emergency Medicine | Admitting: Emergency Medicine

## 2024-06-06 ENCOUNTER — Other Ambulatory Visit: Payer: Self-pay

## 2024-06-06 DIAGNOSIS — R799 Abnormal finding of blood chemistry, unspecified: Secondary | ICD-10-CM | POA: Insufficient documentation

## 2024-06-06 DIAGNOSIS — Y92129 Unspecified place in nursing home as the place of occurrence of the external cause: Secondary | ICD-10-CM | POA: Diagnosis not present

## 2024-06-06 DIAGNOSIS — W19XXXA Unspecified fall, initial encounter: Secondary | ICD-10-CM | POA: Insufficient documentation

## 2024-06-06 DIAGNOSIS — S8991XA Unspecified injury of right lower leg, initial encounter: Secondary | ICD-10-CM | POA: Diagnosis present

## 2024-06-06 DIAGNOSIS — N3000 Acute cystitis without hematuria: Secondary | ICD-10-CM | POA: Diagnosis not present

## 2024-06-06 DIAGNOSIS — S8001XA Contusion of right knee, initial encounter: Secondary | ICD-10-CM | POA: Insufficient documentation

## 2024-06-06 LAB — CBC
HCT: 35.2 % — ABNORMAL LOW (ref 36.0–46.0)
Hemoglobin: 11.3 g/dL — ABNORMAL LOW (ref 12.0–15.0)
MCH: 31 pg (ref 26.0–34.0)
MCHC: 32.1 g/dL (ref 30.0–36.0)
MCV: 96.4 fL (ref 80.0–100.0)
Platelets: 171 K/uL (ref 150–400)
RBC: 3.65 MIL/uL — ABNORMAL LOW (ref 3.87–5.11)
RDW: 13.7 % (ref 11.5–15.5)
WBC: 5.6 K/uL (ref 4.0–10.5)
nRBC: 0 % (ref 0.0–0.2)

## 2024-06-06 LAB — URINALYSIS, ROUTINE W REFLEX MICROSCOPIC
Bilirubin Urine: NEGATIVE
Glucose, UA: NEGATIVE mg/dL
Ketones, ur: NEGATIVE mg/dL
Nitrite: POSITIVE — AB
Protein, ur: NEGATIVE mg/dL
Specific Gravity, Urine: 1.017 (ref 1.005–1.030)
pH: 7 (ref 5.0–8.0)

## 2024-06-06 LAB — COMPREHENSIVE METABOLIC PANEL WITH GFR
ALT: 19 U/L (ref 0–44)
AST: 25 U/L (ref 15–41)
Albumin: 4.3 g/dL (ref 3.5–5.0)
Alkaline Phosphatase: 85 U/L (ref 38–126)
Anion gap: 13 (ref 5–15)
BUN: 19 mg/dL (ref 8–23)
CO2: 22 mmol/L (ref 22–32)
Calcium: 8.6 mg/dL — ABNORMAL LOW (ref 8.9–10.3)
Chloride: 105 mmol/L (ref 98–111)
Creatinine, Ser: 0.71 mg/dL (ref 0.44–1.00)
GFR, Estimated: >60 mL/min
Glucose, Bld: 91 mg/dL (ref 70–99)
Potassium: 4 mmol/L (ref 3.5–5.1)
Sodium: 141 mmol/L (ref 135–145)
Total Bilirubin: <0.2 mg/dL (ref 0.0–1.2)
Total Protein: 6.5 g/dL (ref 6.5–8.1)

## 2024-06-06 LAB — CBG MONITORING, ED: Glucose-Capillary: 94 mg/dL (ref 70–99)

## 2024-06-06 MED ORDER — ACETAMINOPHEN 500 MG PO TABS
1000.0000 mg | ORAL_TABLET | Freq: Once | ORAL | Status: AC
  Administered 2024-06-06: 1000 mg via ORAL
  Filled 2024-06-06: qty 2

## 2024-06-06 MED ORDER — CEFUROXIME AXETIL 250 MG PO TABS
250.0000 mg | ORAL_TABLET | Freq: Once | ORAL | Status: AC
  Administered 2024-06-06: 250 mg via ORAL
  Filled 2024-06-06: qty 1

## 2024-06-06 MED ORDER — CEFUROXIME AXETIL 250 MG PO TABS: 250.0000 mg | ORAL_TABLET | Freq: Two times a day (BID) | ORAL | 0 refills | Status: AC

## 2024-06-06 NOTE — Discharge Instructions (Signed)
 Signs of UTI on urinalysis, sent for culture.  She started on 5 days of cefuroxime /Ceftin  antibiotics -twice daily for 5 days  Otherwise reassuring blood work and imaging

## 2024-06-06 NOTE — ED Triage Notes (Addendum)
 Pt to ED via [facility] from [facility] [place]. Pt had unwitnessed fall. Bruise to right knee. EMS reports strong urine odor. Altered at baseline per EMS.   99.7 oral 128/61 120 CBG

## 2024-06-06 NOTE — ED Notes (Signed)
 [facility] called for transport to [facility] [address]

## 2024-06-06 NOTE — ED Notes (Signed)
 Unsuccessful attempt to give report to [facility] [place].

## 2024-06-06 NOTE — ED Provider Notes (Signed)
 "  [facility] Provider Note    Event Date/Time   First MD Initiated Contact with Patient 06/06/24 [phone]     (approximate)   History   Fall   HPI  Marcia Brown is a 68 y.o. 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  Plavix  without anticoagulation.  Patient presents from a local SNF after being found down.  No reported seizure activity, reportedly at her baseline.  Here in the ED patient has no complaints and is uncertain why she is here.  Reports she feels fine   Physical Exam   Triage Vital Signs: ED Triage Vitals [06/06/24 0851]  Encounter Vitals Group     BP      Girls Systolic BP Percentile      Girls Diastolic BP Percentile      Boys Systolic BP Percentile      Boys Diastolic BP Percentile      Pulse      Resp      Temp      Temp src      SpO2      Weight      Height      Head Circumference      Peak Flow      Pain Score 5     Pain Loc      Pain Education      Exclude from Growth Chart     Most recent vital signs: Vitals:   06/06/24 1230 06/06/24 1254  BP: (!) 118/105   Pulse: (!) 58   Resp: 13   Temp:  99.7 F (37.6 C)  SpO2: 99%     General: Awake, no distress.  CV:  Good peripheral perfusion.  Resp:  Normal effort.  Abd:  No distention.  MSK:  Faint bruising to the medial aspect of the right knee.  Discomfort when actively ranging.  Some mild tenderness over the left hip with anterior palpation.  Neuro:  No focal deficits appreciated. Other:     ED Results / Procedures / Treatments   Labs (all labs ordered are listed, but only abnormal results are displayed) Labs Reviewed  COMPREHENSIVE METABOLIC PANEL WITH GFR - Abnormal; Notable for the following components:      Result Value   Calcium  8.6 (*)    All other components within normal limits   CBC - Abnormal; Notable for the following components:   RBC 3.65 (*)    Hemoglobin 11.3 (*)    HCT 35.2 (*)    All other components within normal limits  URINALYSIS, ROUTINE W REFLEX MICROSCOPIC - Abnormal; Notable for the following components:   Color, Urine YELLOW (*)    APPearance HAZY (*)    Hgb urine dipstick MODERATE (*)    Nitrite POSITIVE (*)    Leukocytes,Ua TRACE (*)    Bacteria, UA FEW (*)    All other components within normal limits  URINE CULTURE  CBG MONITORING, ED    EKG Sinus rhythm with a rate of 59 bpm, treatment is baseline clots fine detail.  Normal axis and intervals without clear signs of acute ischemia  RADIOLOGY CXR interpreted by me without evidence of acute cardiopulmonary pathology. Plain film of the right knee interpreted by me without evidence of fracture or dislocation Plain film of the pelvis and left hip interpreted  by me without evidence of fracture or dislocation.  Official radiology report(s): DG Knee Complete 4 Views Right Result Date: 06/06/2024 CLINICAL DATA:  Fall.  Bruising to the right knee. EXAM: RIGHT KNEE - COMPLETE 4+ VIEW COMPARISON:  None Available. FINDINGS: No evidence for an acute fracture. No dislocation. No joint effusion. Moderately advanced hypertrophic spurring visible in all 3 compartments. IMPRESSION: Tricompartmental degenerative changes without acute bony findings. Electronically Signed   By: Camellia Candle M.D.   On: 06/06/2024 12:54   DG Chest Portable 1 View Result Date: 06/06/2024 CLINICAL DATA:  Unwitnessed fall. EXAM: PORTABLE CHEST 1 VIEW COMPARISON:  02/21/2024 FINDINGS: The lungs are clear without focal pneumonia, edema, pneumothorax or pleural effusion. Cardiopericardial silhouette is at upper limits of normal for size. Hiatal hernia noted. No acute bony abnormality. Chronic fracture nonunion right humeral neck. Telemetry leads overlie the chest. IMPRESSION: 1. No acute cardiopulmonary findings. 2. Hiatal hernia.  Electronically Signed   By: Camellia Candle M.D.   On: 06/06/2024 12:54   DG HIP UNILAT W OR W/O PELVIS 2-3 VIEWS LEFT Result Date: 06/06/2024 CLINICAL DATA:  Unwitnessed fall. EXAM: DG HIP (WITH OR WITHOUT PELVIS) 2-3V LEFT COMPARISON:  None Available. FINDINGS: There is no evidence of hip fracture or dislocation. There is no evidence of arthropathy or other focal bone abnormality. IMPRESSION: Negative. Electronically Signed   By: Camellia Candle M.D.   On: 06/06/2024 12:52    PROCEDURES and INTERVENTIONS:  .1-3 Lead EKG Interpretation  Performed by: Claudene Rover, MD Authorized by: Claudene Rover, MD     Interpretation: normal     ECG rate:  62   ECG rate assessment: normal     Rhythm: sinus rhythm     Ectopy: none     Conduction: normal     Medications  cefUROXime  (CEFTIN ) tablet 250 mg (250 mg Oral Given 06/06/24 1258)     IMPRESSION / MDM / ASSESSMENT AND PLAN / ED COURSE  I reviewed the triage vital signs and the nursing notes.  Differential diagnosis includes, but is not limited to, syncope, seizure, ICH, fracture, dislocation, metabolic cephalopathy  {Patient presents with symptoms of an acute illness or injury that is potentially life-threatening.  Patient presents from SNF after a fall with signs of acute cystitis suitable for trial of outpatient management.  Asymptomatic and a generally reassuring exam.  Some mild bruising over her knee but overall reassuring workup with regards to traumatic pathology.  Chest x-ray, pelvis and left hip, right knee imaging without traumatic derangements.  Blood work without acute features, CBC and metabolic panel  Urine with infectious features and sent for culture, will start on antibiotics and return to facility with return precautions for the ED  Clinical Course as of 06/06/24 1328  Sat Jun 06, 2024  1052 Found down. No complaints, left hip , right knee [DS]    Clinical Course User Index [DS] Claudene Rover, MD     FINAL CLINICAL  IMPRESSION(S) / ED DIAGNOSES   Final diagnoses:  Fall, initial encounter  Acute cystitis without hematuria     Rx / DC Orders   ED Discharge Orders          Ordered    cefUROXime  (CEFTIN ) 250 MG tablet  2 times daily with meals        06/06/24 1324             Note:  This document was prepared using Dragon voice recognition software and may include unintentional dictation errors.  Claudene Rover, MD 06/06/24 1330  "

## 2024-06-06 NOTE — ED Notes (Signed)
 This RN called [facility]. This RN gave report to Vanessa, med tech. Informed Vanessa that patient is on her way back to the facility.

## 2024-06-08 LAB — URINE CULTURE: Culture: >=100000 — AB
